# Patient Record
Sex: Female | Born: 1950
Health system: Southern US, Community
[De-identification: ages and names within clinical notes are randomized; demographics above are authoritative.]

## PROBLEM LIST (undated history)

## (undated) ENCOUNTER — Emergency Department: Discharge status: Absent without leave | Payer: Self-pay

## (undated) DIAGNOSIS — S99911A Unspecified injury of right ankle, initial encounter: Secondary | ICD-10-CM

## (undated) DIAGNOSIS — F411 Generalized anxiety disorder: Secondary | ICD-10-CM

## (undated) DIAGNOSIS — B373 Candidiasis of vulva and vagina: Secondary | ICD-10-CM

## (undated) DIAGNOSIS — M5430 Sciatica, unspecified side: Secondary | ICD-10-CM

## (undated) DIAGNOSIS — R05 Cough: Secondary | ICD-10-CM

## (undated) DIAGNOSIS — M25511 Pain in right shoulder: Secondary | ICD-10-CM

## (undated) DIAGNOSIS — H469 Unspecified optic neuritis: Secondary | ICD-10-CM

## (undated) DIAGNOSIS — I10 Essential (primary) hypertension: Secondary | ICD-10-CM

## (undated) DIAGNOSIS — M722 Plantar fascial fibromatosis: Secondary | ICD-10-CM

## (undated) DIAGNOSIS — I700 Atherosclerosis of aorta: Secondary | ICD-10-CM

## (undated) DIAGNOSIS — R112 Nausea with vomiting, unspecified: Secondary | ICD-10-CM

## (undated) DIAGNOSIS — E876 Hypokalemia: Secondary | ICD-10-CM

## (undated) DIAGNOSIS — I4729 Other ventricular tachycardia: Secondary | ICD-10-CM

## (undated) DIAGNOSIS — G360 Neuromyelitis optica [Devic]: Secondary | ICD-10-CM

## (undated) DIAGNOSIS — J309 Allergic rhinitis, unspecified: Secondary | ICD-10-CM

## (undated) DIAGNOSIS — Z8673 Personal history of transient ischemic attack (TIA), and cerebral infarction without residual deficits: Secondary | ICD-10-CM

## (undated) DIAGNOSIS — R002 Palpitations: Secondary | ICD-10-CM

## (undated) DIAGNOSIS — K219 Gastro-esophageal reflux disease without esophagitis: Secondary | ICD-10-CM

## (undated) DIAGNOSIS — M79604 Pain in right leg: Secondary | ICD-10-CM

## (undated) DIAGNOSIS — K429 Umbilical hernia without obstruction or gangrene: Secondary | ICD-10-CM

## (undated) DIAGNOSIS — M2550 Pain in unspecified joint: Secondary | ICD-10-CM

## (undated) DIAGNOSIS — R109 Unspecified abdominal pain: Secondary | ICD-10-CM

## (undated) DIAGNOSIS — E2839 Other primary ovarian failure: Secondary | ICD-10-CM

## (undated) DIAGNOSIS — M1990 Unspecified osteoarthritis, unspecified site: Secondary | ICD-10-CM

## (undated) DIAGNOSIS — E559 Vitamin D deficiency, unspecified: Secondary | ICD-10-CM

## (undated) DIAGNOSIS — E785 Hyperlipidemia, unspecified: Secondary | ICD-10-CM

## (undated) DIAGNOSIS — H5210 Myopia, unspecified eye: Secondary | ICD-10-CM

## (undated) DIAGNOSIS — J069 Acute upper respiratory infection, unspecified: Secondary | ICD-10-CM

## (undated) DIAGNOSIS — M8580 Other specified disorders of bone density and structure, unspecified site: Secondary | ICD-10-CM

## (undated) DIAGNOSIS — R21 Rash and other nonspecific skin eruption: Secondary | ICD-10-CM

## (undated) DIAGNOSIS — Z90710 Acquired absence of both cervix and uterus: Secondary | ICD-10-CM

## (undated) DIAGNOSIS — I472 Ventricular tachycardia: Secondary | ICD-10-CM

## (undated) DIAGNOSIS — M25521 Pain in right elbow: Secondary | ICD-10-CM

## (undated) DIAGNOSIS — H5340 Unspecified visual field defects: Secondary | ICD-10-CM

## (undated) DIAGNOSIS — R42 Dizziness and giddiness: Secondary | ICD-10-CM

## (undated) HISTORY — DX: Essential (primary) hypertension: I10

## (undated) HISTORY — DX: Generalized anxiety disorder: F41.1

## (undated) HISTORY — DX: Palpitations: R00.2

## (undated) HISTORY — DX: Hyperlipidemia, unspecified: E78.5

## (undated) HISTORY — DX: Myopia, unspecified eye: H52.10

## (undated) HISTORY — DX: Morbid (severe) obesity due to excess calories: E66.01

## (undated) HISTORY — DX: Ventricular tachycardia: I47.2

## (undated) HISTORY — DX: Unspecified osteoarthritis, unspecified site: M19.90

## (undated) HISTORY — DX: Other specified disorders of bone density and structure, unspecified site: M85.80

## (undated) HISTORY — DX: Unspecified visual field defects: H53.40

## (undated) HISTORY — DX: Other ventricular tachycardia: I47.29

## (undated) HISTORY — DX: Acquired absence of both cervix and uterus: Z90.710

## (undated) SURGERY — EGD (ESOPHAGOGASTRODUODENOSCOPY)
Anesthesia: Moderate Sedation

---
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
## 1972-01-10 HISTORY — PX: ABDOMINAL HYSTERECTOMY: SHX81

## 1999-05-09 ENCOUNTER — Encounter: Admission: RE | Admit: 1999-05-09 | Discharge: 1999-05-09 | Payer: Self-pay | Admitting: Internal Medicine

## 2001-03-05 ENCOUNTER — Emergency Department (HOSPITAL_COMMUNITY): Admission: EM | Admit: 2001-03-05 | Discharge: 2001-03-05 | Payer: Self-pay | Admitting: *Deleted

## 2003-05-13 ENCOUNTER — Encounter: Admission: RE | Admit: 2003-05-13 | Discharge: 2003-05-13 | Payer: Self-pay | Admitting: Internal Medicine

## 2003-10-18 ENCOUNTER — Emergency Department (HOSPITAL_COMMUNITY): Admission: EM | Admit: 2003-10-18 | Discharge: 2003-10-18 | Payer: Self-pay | Admitting: *Deleted

## 2004-05-05 ENCOUNTER — Ambulatory Visit: Payer: Self-pay | Admitting: Internal Medicine

## 2004-11-23 ENCOUNTER — Ambulatory Visit: Payer: Self-pay | Admitting: Internal Medicine

## 2004-12-08 ENCOUNTER — Ambulatory Visit: Payer: Self-pay | Admitting: Cardiology

## 2004-12-08 ENCOUNTER — Encounter: Admission: RE | Admit: 2004-12-08 | Discharge: 2004-12-08 | Payer: Self-pay | Admitting: Internal Medicine

## 2004-12-19 ENCOUNTER — Emergency Department (HOSPITAL_COMMUNITY): Admission: EM | Admit: 2004-12-19 | Discharge: 2004-12-20 | Payer: Self-pay | Admitting: Emergency Medicine

## 2004-12-23 ENCOUNTER — Ambulatory Visit: Payer: Self-pay | Admitting: Internal Medicine

## 2005-01-10 ENCOUNTER — Encounter: Payer: Self-pay | Admitting: Cardiology

## 2005-01-10 ENCOUNTER — Ambulatory Visit: Payer: Self-pay

## 2005-01-23 ENCOUNTER — Ambulatory Visit: Payer: Self-pay | Admitting: Internal Medicine

## 2005-01-23 ENCOUNTER — Ambulatory Visit: Payer: Self-pay

## 2005-08-20 ENCOUNTER — Emergency Department (HOSPITAL_COMMUNITY): Admission: EM | Admit: 2005-08-20 | Discharge: 2005-08-20 | Payer: Self-pay | Admitting: *Deleted

## 2006-01-25 ENCOUNTER — Ambulatory Visit: Payer: Self-pay | Admitting: Internal Medicine

## 2006-05-04 ENCOUNTER — Ambulatory Visit: Payer: Self-pay | Admitting: Pulmonary Disease

## 2006-10-01 ENCOUNTER — Ambulatory Visit: Payer: Self-pay

## 2006-10-01 ENCOUNTER — Ambulatory Visit: Payer: Self-pay | Admitting: Internal Medicine

## 2006-10-01 LAB — CONVERTED CEMR LAB
Creatinine, Ser: 0.9 mg/dL (ref 0.4–1.2)
Glucose, Bld: 101 mg/dL — ABNORMAL HIGH (ref 70–99)
Potassium: 4.5 meq/L (ref 3.5–5.1)
Sodium: 144 meq/L (ref 135–145)

## 2006-10-08 ENCOUNTER — Ambulatory Visit: Payer: Self-pay | Admitting: Internal Medicine

## 2006-10-25 ENCOUNTER — Ambulatory Visit: Payer: Self-pay | Admitting: Internal Medicine

## 2006-12-24 ENCOUNTER — Telehealth (INDEPENDENT_AMBULATORY_CARE_PROVIDER_SITE_OTHER): Payer: Self-pay | Admitting: *Deleted

## 2006-12-24 DIAGNOSIS — F411 Generalized anxiety disorder: Secondary | ICD-10-CM | POA: Insufficient documentation

## 2006-12-24 DIAGNOSIS — E785 Hyperlipidemia, unspecified: Secondary | ICD-10-CM | POA: Insufficient documentation

## 2006-12-24 DIAGNOSIS — Z8679 Personal history of other diseases of the circulatory system: Secondary | ICD-10-CM | POA: Insufficient documentation

## 2006-12-24 DIAGNOSIS — I10 Essential (primary) hypertension: Secondary | ICD-10-CM

## 2006-12-24 HISTORY — DX: Essential (primary) hypertension: I10

## 2007-01-14 ENCOUNTER — Ambulatory Visit: Payer: Self-pay | Admitting: Internal Medicine

## 2007-01-14 DIAGNOSIS — H5210 Myopia, unspecified eye: Secondary | ICD-10-CM

## 2007-01-15 ENCOUNTER — Encounter: Payer: Self-pay | Admitting: Internal Medicine

## 2007-01-15 ENCOUNTER — Ambulatory Visit (HOSPITAL_COMMUNITY): Admission: RE | Admit: 2007-01-15 | Discharge: 2007-01-15 | Payer: Self-pay | Admitting: Internal Medicine

## 2007-03-18 ENCOUNTER — Ambulatory Visit: Payer: Self-pay | Admitting: Internal Medicine

## 2007-08-20 ENCOUNTER — Telehealth (INDEPENDENT_AMBULATORY_CARE_PROVIDER_SITE_OTHER): Payer: Self-pay | Admitting: *Deleted

## 2007-09-20 ENCOUNTER — Ambulatory Visit: Payer: Self-pay | Admitting: Internal Medicine

## 2007-09-23 ENCOUNTER — Ambulatory Visit: Payer: Self-pay | Admitting: Internal Medicine

## 2007-09-24 LAB — CONVERTED CEMR LAB
ALT: 19 units/L (ref 0–35)
Albumin: 3.9 g/dL (ref 3.5–5.2)
BUN: 14 mg/dL (ref 6–23)
Basophils Relative: 1.4 % (ref 0.0–3.0)
CO2: 29 meq/L (ref 19–32)
Calcium: 8.9 mg/dL (ref 8.4–10.5)
Cholesterol: 229 mg/dL (ref 0–200)
Creatinine, Ser: 0.8 mg/dL (ref 0.4–1.2)
Eosinophils Relative: 0.9 % (ref 0.0–5.0)
GFR calc Af Amer: 95 mL/min
Glucose, Bld: 91 mg/dL (ref 70–99)
HCT: 39.2 % (ref 36.0–46.0)
Hemoglobin: 13.4 g/dL (ref 12.0–15.0)
Monocytes Absolute: 0.3 10*3/uL (ref 0.1–1.0)
Monocytes Relative: 10.1 % (ref 3.0–12.0)
Neutro Abs: 1.2 10*3/uL — ABNORMAL LOW (ref 1.4–7.7)
RBC: 4.9 M/uL (ref 3.87–5.11)
RDW: 13.6 % (ref 11.5–14.6)
Sodium: 148 meq/L — ABNORMAL HIGH (ref 135–145)
TSH: 0.79 microintl units/mL (ref 0.35–5.50)
Total CHOL/HDL Ratio: 7.6
Total Protein: 7.2 g/dL (ref 6.0–8.3)

## 2008-01-07 ENCOUNTER — Telehealth (INDEPENDENT_AMBULATORY_CARE_PROVIDER_SITE_OTHER): Payer: Self-pay | Admitting: *Deleted

## 2008-01-16 ENCOUNTER — Ambulatory Visit: Payer: Self-pay | Admitting: Internal Medicine

## 2008-04-09 ENCOUNTER — Telehealth: Payer: Self-pay | Admitting: Internal Medicine

## 2008-09-25 ENCOUNTER — Encounter (INDEPENDENT_AMBULATORY_CARE_PROVIDER_SITE_OTHER): Payer: Self-pay | Admitting: *Deleted

## 2008-11-03 ENCOUNTER — Encounter (INDEPENDENT_AMBULATORY_CARE_PROVIDER_SITE_OTHER): Payer: Self-pay | Admitting: *Deleted

## 2008-11-11 ENCOUNTER — Telehealth: Payer: Self-pay | Admitting: Internal Medicine

## 2008-11-13 ENCOUNTER — Telehealth: Payer: Self-pay | Admitting: Internal Medicine

## 2008-12-04 ENCOUNTER — Ambulatory Visit: Payer: Self-pay | Admitting: Internal Medicine

## 2008-12-04 DIAGNOSIS — I472 Ventricular tachycardia, unspecified: Secondary | ICD-10-CM | POA: Insufficient documentation

## 2008-12-04 DIAGNOSIS — M2550 Pain in unspecified joint: Secondary | ICD-10-CM

## 2008-12-04 HISTORY — DX: Pain in unspecified joint: M25.50

## 2008-12-04 HISTORY — DX: Ventricular tachycardia: I47.2

## 2008-12-04 HISTORY — DX: Ventricular tachycardia, unspecified: I47.20

## 2008-12-09 LAB — CONVERTED CEMR LAB
BUN: 8 mg/dL (ref 6–23)
CO2: 31 meq/L (ref 19–32)
Chloride: 106 meq/L (ref 96–112)
Cholesterol: 222 mg/dL — ABNORMAL HIGH (ref 0–200)
Creatinine, Ser: 0.8 mg/dL (ref 0.4–1.2)
Potassium: 4.1 meq/L (ref 3.5–5.1)

## 2009-06-18 ENCOUNTER — Telehealth: Payer: Self-pay | Admitting: Internal Medicine

## 2010-01-20 ENCOUNTER — Ambulatory Visit
Admission: RE | Admit: 2010-01-20 | Discharge: 2010-01-20 | Payer: Self-pay | Source: Home / Self Care | Attending: Internal Medicine | Admitting: Internal Medicine

## 2010-01-20 ENCOUNTER — Encounter: Payer: Self-pay | Admitting: Internal Medicine

## 2010-01-21 ENCOUNTER — Telehealth: Payer: Self-pay | Admitting: Internal Medicine

## 2010-01-26 ENCOUNTER — Telehealth: Payer: Self-pay | Admitting: Internal Medicine

## 2010-01-28 ENCOUNTER — Emergency Department (HOSPITAL_COMMUNITY)
Admission: EM | Admit: 2010-01-28 | Discharge: 2010-01-28 | Payer: Self-pay | Source: Home / Self Care | Admitting: Emergency Medicine

## 2010-01-31 ENCOUNTER — Telehealth: Payer: Self-pay | Admitting: Internal Medicine

## 2010-02-08 NOTE — Progress Notes (Signed)
Summary: refill**[facility] in [place]**Please call pt once sent   Phone Note Refill Request Call back at Home Phone [phone] Message from:  Patient on June 18, 2009 10:09 AM  Refills Requested: Medication #1:  NADOLOL 40 MG TABS Take 1 tablet by mouth twice a day   Supply Requested: 6 months [facility] in [place] [phone]   Method Requested: Telephone to Pharmacy Initial call taken by: Migdalia Dk,  June 18, 2009 10:10 AM  Follow-up for Phone Call        called in to pharmacy. Follow-up by: Laurance Flatten CMA,  June 18, 2009 2:06 PM

## 2010-02-10 NOTE — Progress Notes (Addendum)
Summary: Question about meds   Phone Note Call from Patient Call back at Home Phone [phone] Message from:  Patient on January 21, 2010 12:50 PM  Caller: Patient Reason for Call: Talk to Nurse Summary of Call: About a medication Initial call taken by: Judie Grieve,  January 21, 2010 12:52 PM  Follow-up for Phone Call        [email or web address] Michelle Gladden, RN, BSN 01/21/10 1301 called pt and she is asking for [name] to prescribe/refill Sulindac. Adv pt that we do not prescribe that medication and to contact Dr. Sherene Sires for a renewal. Took some explaining but pt expressed understanding.  Follow-up by: Michelle Gladden RN,  January 21, 2010 2:46 PM

## 2010-02-10 NOTE — Progress Notes (Signed)
Summary: question re meds   Phone Note Call from Patient Call back at Home Phone [phone]   Caller: Patient Reason for Call: Talk to Nurse Summary of Call:  pt states she went to the hospital on friday. pt states at the hospital they gave her a prescription for prednisone 50mg . pt wants to know if she can take the med. Initial call taken by: Roe Coombs,  January 31, 2010 10:57 AM  Follow-up for Phone Call        explained to her that the Prednisone may cause her heart to race and she should really follow up with the ortho doctor Dennis Bast, RN, BSN  January 31, 2010 11:46 AM

## 2010-02-10 NOTE — Progress Notes (Signed)
Summary: refill on Sulindac  Phone Note From Pharmacy   Caller: Michelle Hurley Kitchen Call For: Michelle Hurley  Summary of Call: Received a refill request for Sulindac 200mg  tablets. Pt last seen in 11-2008. She has a pending appt on 02-24-10. Please advsie if ok to give refill.  Initial call taken by: Carron Curie CMA,  January 26, 2010 10:59 AM  Follow-up for Phone Call        ok Follow-up by: Nyoka Cowden MD,  January 26, 2010 11:01 AM    New/Updated Medications: SULINDAC 200 MG TABS (SULINDAC) Take one tablet by mouth twice daily with meals for aches and pains Prescriptions: SULINDAC 200 MG TABS (SULINDAC) Take one tablet by mouth twice daily with meals for aches and pains  #60 x 0   Entered by:   Carron Curie CMA   Authorized by:   Nyoka Cowden MD   Signed by:   Carron Curie CMA on 01/26/2010   Method used:   Electronically to        [facility] (retail)       [address]       [place], [place]  [address]       Ph: [phone]       Fax: [phone]   RxID:   [id number]

## 2010-02-10 NOTE — Assessment & Plan Note (Signed)
Summary: yearly/sl  Medications Added CARVEDILOL 25 MG TABS (CARVEDILOL) one by mouth bid TYLENOL EXTRA STRENGTH 500 MG TABS (ACETAMINOPHEN) as needed      Allergies Added:   Primary Provider:  Sherene Sires   History of Present Illness: Michelle Hurley returns today for followup of her VT.  She has a h/o NSVT and has been fairly well controlled on combination therapy with Nadolol and Verapamil.  She also has chronic diastolic CHF which has been fairly well controlled on a low sodium diet and lasix. Since we saw her last over a year ago, she has not had any additional VT but her blood pressure continues to be a problem. She denies sodium indiscretion.  Current Medications (verified): 1)  Furosemide 40 Mg  Tabs (Furosemide) .... Every Morning 2)  Nadolol 40 Mg Tabs (Nadolol) .... Take 1 Tablet By Mouth Twice A Day 3)  Verapamil Hcl Cr 120 Mg  Cp24 (Verapamil Hcl) .... Take 1/2 Tablet Three Times A Day 4)  Claritin 10 Mg  Tabs (Loratadine) .... As Needed 5)  Tylenol Extra Strength 500 Mg Tabs (Acetaminophen) .... As Needed  Allergies (verified): 1)  ! Codeine  Past History:  Past Medical History: Last updated: 12/04/2008 MYOPIA (ICD-367.1) ANXIETY (ICD-300.00) PALPITATIONS, HX OF (ICD-V12.50) OBESITY, MORBID (ICD-278.01)    - Target wt  =   158 for BMI < 30  HYPERLIPIDEMIA (ICD-272.4)    - target LDL less than 130 based on positive family history and hypertension HYPERTENSION (ICD-401.9) OSTEOPENIA    -  DEXA scan 05/13/1998 by [place] spine -1.9 left hip -.33 left femur -.4 Health Maintenance..........................................................Marland KitchenWert   - Td 1/07   -  neg colonoscopy 9/01 [facility]   -  CPX  09/23/07    Past Surgical History: Last updated: 09/23/2007 Hysterectomy with "one ovary left"  Review of Systems  The patient denies chest pain, syncope, dyspnea on exertion, and peripheral edema.    Vital Signs:  Patient profile:   60 year old female Height:      62  inches Weight:      186 pounds BMI:     34.14 Pulse rate:   66 / minute BP sitting:   178 / 102  (left arm)  Vitals Entered By: Laurance Flatten CMA (January 20, 2010 9:46 AM)  Physical Exam  General:  Obese, well developed, well nourished, in no acute distress.  HEENT: normal Neck: supple. No JVD. Carotids 2+ bilaterally no bruits Cor: RRR no rubs, gallops or murmur Lungs: CTA Ab: soft, nontender. nondistended. No HSM. Good bowel sounds Ext: warm. no cyanosis, clubbing or edema Neuro: alert and oriented. Grossly nonfocal. affect pleasant    EKG  Procedure date:  01/20/2010  Findings:      Normal sinus rhythm with rate of: 66.   Impression & Recommendations:  Problem # 1:  VENTRICULAR TACHYCARDIA (ICD-427.1) Her symptoms are well controlled. Will switch beta blockers today for blood pressure reasons. Her updated medication list for this problem includes:    Carvedilol 25 Mg Tabs (Carvedilol) ..... One by mouth bid    Verapamil Hcl Cr 120 Mg Cp24 (Verapamil hcl) .Marland Kitchen... Take 1/2 tablet three times a day  Orders: EKG w/ Interpretation (93[address])  Problem # 2:  HYPERTENSION (ICD-401.9) Her blood pressure is not well controlled. I have asked her to continue her low sodium diet and I am going to switch her to coreg today. Her updated medication list for this problem includes:    Furosemide 40 Mg Tabs (  Furosemide) ..... Every morning    Carvedilol 25 Mg Tabs (Carvedilol) ..... One by mouth bid    Verapamil Hcl Cr 120 Mg Cp24 (Verapamil hcl) .Marland Kitchen... Take 1/2 tablet three times a day  Patient Instructions: 1)  Your physician wants you to follow-up in:  6 months with Dr Court Joy will receive a reminder letter in the mail two months in advance. If you don't receive a letter, please call our office to schedule the follow-up appointment. 2)  Your physician has recommended you make the following change in your medication: stop Nadalol and start Carvedilol 25mg  two times a  day Prescriptions: CARVEDILOL 25 MG TABS (CARVEDILOL) one by mouth bid  #60 x 11   Entered by:   Dennis Bast, RN, BSN   Authorized by:   Laren Boom, MD, [facility]   Signed by:   Dennis Bast, RN, BSN on 01/20/2010   Method used:   Electronically to        [facility] (retail)       [address]       [place], [place]  [address]       Ph: [phone]       Fax: [phone]   RxID:   [id number]

## 2010-02-14 ENCOUNTER — Telehealth: Payer: Self-pay | Admitting: Internal Medicine

## 2010-02-22 ENCOUNTER — Other Ambulatory Visit: Payer: Self-pay

## 2010-02-24 ENCOUNTER — Ambulatory Visit: Payer: Self-pay | Admitting: Internal Medicine

## 2010-02-24 NOTE — Progress Notes (Signed)
Summary: reaction to meds/**LM/nm/2nd call  Medications Added FUROSEMIDE 40 MG  TABS (FUROSEMIDE) one by mouth two times a day POTASSIUM CHLORIDE CRYS CR 20 MEQ CR-TABS (POTASSIUM CHLORIDE CRYS CR) one by mouth daily       Phone Note Call from Patient Call back at Home Phone [phone]   Caller: Patient Reason for Call: Talk to Nurse Summary of Call: pt having reaction to meds. pt having swelling a over her body to new meds.  Initial call taken by: Roe Coombs,  February 14, 2010 3:03 PM  Follow-up for Phone Call        [facility]. Ollen Gross, RN, BSN  February 14, 2010 4:50 PM   Additional Follow-up for Phone Call Additional follow up Details #1::        Pt was rude and upset that she had miss the nurse call and  we had ask her for the reason for her call. pt stated she only wants to talk to [facility] or dr Ladona Ridgel. Pt stated she still having issues with her blood pressure and some swelling. pt wants to know if she needs to be seen by the doctor again.  Additional Follow-up by: Roe Coombs,  February 15, 2010 2:53 PM    Additional Follow-up for Phone Call Additional follow up Details #2::    Left ankle and foot swollen rt foot and ankle too but when she goes to bed the rt one goes down the lft goes down partially but not all the way.  BP is still up.  Discussed with Dr Ladona Ridgel pt to take Furosemide 40mg  two times a day and K-dur daily  have a BMP next week Pt aware Dennis Bast, RN, BSN  February 15, 2010 4:12 PM Follow-up by: Dennis Bast, RN, BSN,  February 15, 2010 4:09 PM  New/Updated Medications: FUROSEMIDE 40 MG  TABS (FUROSEMIDE) one by mouth two times a day POTASSIUM CHLORIDE CRYS CR 20 MEQ CR-TABS (POTASSIUM CHLORIDE CRYS CR) one by mouth daily Prescriptions: POTASSIUM CHLORIDE CRYS CR 20 MEQ CR-TABS (POTASSIUM CHLORIDE CRYS CR) one by mouth daily  #30 x 6   Entered by:   Dennis Bast, RN, BSN   Authorized by:   Laren Boom, MD, [facility]   Signed by:    Dennis Bast, RN, BSN on 02/15/2010   Method used:   Electronically to        [facility] (retail)       [address]       [place], [place]  [address]       Ph: [phone]       Fax: [phone]   RxID:   [id number] FUROSEMIDE 40 MG  TABS (FUROSEMIDE) one by mouth two times a day  #60 x 6   Entered by:   Dennis Bast, RN, BSN   Authorized by:   Laren Boom, MD, [facility]   Signed by:   Dennis Bast, RN, BSN on 02/15/2010   Method used:   Electronically to        [facility] (retail)       [address]       [place], [place]  [address]       Ph: [phone]       Fax: [phone]   RxID:   [id number]

## 2010-02-25 ENCOUNTER — Telehealth: Payer: Self-pay | Admitting: Internal Medicine

## 2010-03-02 NOTE — Progress Notes (Signed)
Summary: nos appt  Phone Note Call from Patient   Caller: [email or web address]  Call For: Keilah Lemire Summary of Call: Rsc nos from 2/16 to 3/6. Initial call taken by: Darletta Moll,  February 25, 2010 4:01 PM

## 2010-03-15 ENCOUNTER — Ambulatory Visit (INDEPENDENT_AMBULATORY_CARE_PROVIDER_SITE_OTHER): Payer: Self-pay | Admitting: Internal Medicine

## 2010-03-15 ENCOUNTER — Telehealth (INDEPENDENT_AMBULATORY_CARE_PROVIDER_SITE_OTHER): Payer: Self-pay | Admitting: *Deleted

## 2010-03-15 ENCOUNTER — Encounter: Payer: Self-pay | Admitting: Internal Medicine

## 2010-03-15 ENCOUNTER — Other Ambulatory Visit: Payer: Self-pay | Admitting: Internal Medicine

## 2010-03-15 ENCOUNTER — Other Ambulatory Visit: Payer: Self-pay

## 2010-03-15 DIAGNOSIS — I10 Essential (primary) hypertension: Secondary | ICD-10-CM

## 2010-03-15 LAB — BASIC METABOLIC PANEL
CO2: 27 mEq/L (ref 19–32)
Calcium: 8.9 mg/dL (ref 8.4–10.5)
Chloride: 108 mEq/L (ref 96–112)
Creatinine, Ser: 0.9 mg/dL (ref 0.4–1.2)
Glucose, Bld: 97 mg/dL (ref 70–99)
Sodium: 140 mEq/L (ref 135–145)

## 2010-03-22 NOTE — Assessment & Plan Note (Signed)
Summary: Primary svc/ cpx    Primary Provider/Referring Provider:  Sherene Sires  CC:  Pt last seen 11/2008. Pt c/o high BP and feet and ankle swelling.  History of Present Illness: 1 yobf followed here for hypertension and nonsustained ventricular tachycardia for which she is maintained on a combination of nadolol and verapamil.  She denies any exertional dyspnea or palpitations fevers chills sweats orthopnea PND or leg swelling TIA or claudication symptoms.  She is having trouble paying for her medical care but has insurance for the next 6 months.  She has requested referral for ophthalmology because she is using store-bought glasses to see and also his delinquent in mammography which he has asked [name] to schedule for a cone system.  She has had no breast symptoms or findings on self-exam.  09/23/07 cpx.  LDL 172, rec wt loss  March 15, 2010 ov Pt last seen 11/2008. Pt c/o high BP, feet and ankle swelling x 3 months, no sob. Pt denies any significant sore throat, dysphagia, itching, sneezing,  nasal congestion or excess secretions,  fever, chills, sweats, unintended wt loss, pleuritic or exertional cp, hempoptysis, change in activity tolerance  orthopnea pnd. some better with lasix increase but not resolved.  Current Medications (verified): 1)  Furosemide 40 Mg  Tabs (Furosemide) .... One By Mouth Two Times A Day 2)  Carvedilol 25 Mg Tabs (Carvedilol) .... One By Mouth Bid 3)  Verapamil Hcl Cr 120 Mg  Cp24 (Verapamil Hcl) .... Take 1/2 Tablet Three Times A Day 4)  Claritin 10 Mg  Tabs (Loratadine) .... As Needed 5)  Tylenol Extra Strength 500 Mg Tabs (Acetaminophen) .... As Needed 6)  Sulindac 200 Mg Tabs (Sulindac) .... Take One Tablet By Mouth Twice Daily With Meals For Aches and Pains As Needed 7)  Potassium Chloride Crys Cr 20 Meq Cr-Tabs (Potassium Chloride Crys Cr) .... One By Mouth Daily  Allergies (verified): 1)  ! Codeine  Past History:  Past Medical History: MYOPIA  (ICD-367.1) ANXIETY (ICD-300.00) PALPITATIONS, HX OF (ICD-V12.50) OBESITY, MORBID (ICD-278.01)    - Target wt  =   158 for BMI < 30  HYPERLIPIDEMIA (ICD-272.4)    - target LDL less than 130 based on positive family history and hypertension HYPERTENSION (ICD-401.9) OSTEOPENIA    -  DEXA scan 05/13/1998 by [place] [facility] -1.9 left hip -.33 left femur -.4 Health Maintenance............................................................Marland KitchenWert   - Td 1/07   -  neg colonoscopy 9/01 [facility] [place]   -  CPX  09/23/07    Vital Signs:  Patient profile:   59 year old female Height:      62 inches Weight:      195.13 pounds BMI:     35.82 O2 Sat:      97 % on Room air Temp:     98.1 degrees F oral Pulse rate:   74 / minute BP sitting:   142 / 84  (left arm) Cuff size:   regular  Vitals Entered By: Carver Fila (March 15, 2010 9:16 AM)  O2 Flow:  Room air CC: Pt last seen 11/2008. Pt c/o high BP, feet and ankle swelling Comments meds and allergies updated Phone number updated Carver Fila  March 15, 2010 9:17 AM    Physical Exam  Additional Exam:  wt 182 September 23, 2007 > 183 January 16, 2008 > 186  December 04, 2008  > 195 March 15, 2010  in general this is a obese pleasant ambulatory black female  in no acute distress. Afeb with normal vital signs HEENT: nl dentition, turbinates, and orophanx. Nl external ear canals without cough reflex Neck without JVD/Nodes/TM Lungs clear to A and P bilaterally without cough on insp or exp maneuvers RRR no s3 or murmur or increase in P2.  Trace edema both ankles Abd soft and benign with nl excursion in the supine position. No bruits or organomegaly Ext warm without calf tenderness, cyanosis clubbing  Skin warm and dry without lesions     Impression & Recommendations:  Problem # 1:  HYPERTENSION (ICD-401.9)  Her updated medication list for this problem includes:    Furosemide 40 Mg Tabs (Furosemide) ..... One each am, if still swollen in the afternoon  take a second one with potassium    Carvedilol 25 Mg Tabs (Carvedilol) ..... One by mouth bid    Verapamil Hcl Cr 120 Mg Cp24 (Verapamil hcl) .Marland Kitchen... Take 1/2 tablet three times a day    Aldactone 50 Mg Tabs (Spironolactone) ..... One daily   Each maintenance medication was reviewed in detail including most importantly the difference between maintenance and as needed and under what circumstances the prns are to be used. See instructions for specific recommendations   Medications Added to Medication List This Visit: 1)  Furosemide 40 Mg Tabs (Furosemide) .... One each am, if still swollen in the afternoon take a second one with potassium 2)  Aldactone 50 Mg Tabs (Spironolactone) .... One daily 3)  Sulindac 200 Mg Tabs (Sulindac) .... Take one tablet by mouth twice daily with meals for aches and pains as needed 4)  Potassium Chloride Crys Cr 20 Meq Cr-tabs (Potassium chloride crys cr) .... Take only if need a second dose of furosemide  Other Orders: TLB-BMP (Basic Metabolic Panel-BMET) (80048-METABOL) Est. Patient Level III ([address])  Patient Instructions: 1)  Please schedule a follow-up appointment in 6 weeks, sooner if needed either Wert and Tammy 2)  Add aldactone (spironolactone) 50 mg each am 3)  Only take the pm dose of furosemide if still swollen but if you take it then take the potassium with it.  Prescriptions: ALDACTONE 50 MG TABS (SPIRONOLACTONE) one daily  #34 x 11   Entered and Authorized by:   Nyoka Cowden MD   Signed by:   Nyoka Cowden MD on 03/15/2010   Method used:   Electronically to        [facility] (retail)       [address]       [place], [place]  [address]       Ph: [phone]       Fax: [phone]   RxID:   [id number]

## 2010-03-22 NOTE — Progress Notes (Signed)
Summary: meds/ memory loss concerns > defer to Dr Ladona Ridgel re Coreg  Phone Note Call from Patient Call back at [facility] Phone [phone]   Caller: Patient Call For: [facility] Summary of Call: pt wa seen "minutes ago". forgot to mention to dr wert that since she has been taking CARVEDILOL 25mg  she has had some memory loss, such as "what the date is/ what day it is, etc. call pt at home # above.  Initial call taken by: Tivis Ringer, CNA,  March 15, 2010 10:05 AM  Follow-up for Phone Call        PT forgot to mention to dr Sherene Sires that since she started Carvedilol 25mg  two times a day per Dr Riley Lam in January, she has had worsening problems with memory loss and wants to know if Dr Sherene Sires thinks this is related.  Please Advise. Abigail Miyamoto RN  March 15, 2010 12:01 PM  dont' think so but prefer she take this up with Dr Lubertha Basque office Follow-up by: Nyoka Cowden MD,  March 15, 2010 12:42 PM  Additional Follow-up for Phone Call Additional follow up Details #1::        [facility] for pt with Dr Thurston Hole response to question about meds. Abigail Miyamoto RN  March 15, 2010 1:01 PM

## 2010-04-08 ENCOUNTER — Other Ambulatory Visit: Payer: Self-pay | Admitting: Internal Medicine

## 2010-04-26 ENCOUNTER — Telehealth: Payer: Self-pay | Admitting: Internal Medicine

## 2010-04-26 ENCOUNTER — Encounter: Payer: Self-pay | Admitting: Internal Medicine

## 2010-04-26 NOTE — Telephone Encounter (Signed)
Pt advised at her last OV MW told her "you don't have any insurance" and "when you get insurance we will do your physical". Pt states she does not have insurance and when she tried to apply for Medicare/Medicaid she was denied due to age. I assured pt that MW was only externally voicing his inner thoughts in order to bill properly and to keep from charging her for a CPX when he could just order what was needed to provide the care she needed at the cost she can afford. I encouraged pt to keep AM appointment and to come fasting.

## 2010-04-27 ENCOUNTER — Other Ambulatory Visit (INDEPENDENT_AMBULATORY_CARE_PROVIDER_SITE_OTHER): Payer: Self-pay | Admitting: Internal Medicine

## 2010-04-27 ENCOUNTER — Encounter: Payer: Self-pay | Admitting: Internal Medicine

## 2010-04-27 ENCOUNTER — Ambulatory Visit (INDEPENDENT_AMBULATORY_CARE_PROVIDER_SITE_OTHER): Payer: Self-pay | Admitting: Internal Medicine

## 2010-04-27 ENCOUNTER — Other Ambulatory Visit (INDEPENDENT_AMBULATORY_CARE_PROVIDER_SITE_OTHER): Payer: Self-pay

## 2010-04-27 DIAGNOSIS — I10 Essential (primary) hypertension: Secondary | ICD-10-CM

## 2010-04-27 DIAGNOSIS — E785 Hyperlipidemia, unspecified: Secondary | ICD-10-CM

## 2010-04-27 DIAGNOSIS — I472 Ventricular tachycardia, unspecified: Secondary | ICD-10-CM

## 2010-04-27 LAB — BASIC METABOLIC PANEL
BUN: 16 mg/dL (ref 6–23)
CO2: 29 mEq/L (ref 19–32)
Chloride: 103 mEq/L (ref 96–112)
Glucose, Bld: 93 mg/dL (ref 70–99)
Potassium: 3.9 mEq/L (ref 3.5–5.1)

## 2010-04-27 LAB — LIPID PANEL
Cholesterol: 262 mg/dL — ABNORMAL HIGH (ref 0–200)
HDL: 49.5 mg/dL (ref >39.00)
Triglycerides: 124 mg/dL (ref 0.0–149.0)
VLDL: 24.8 mg/dL (ref 0.0–40.0)

## 2010-04-27 LAB — LDL CHOLESTEROL, DIRECT: Direct LDL: 182.8 mg/dL

## 2010-04-27 NOTE — Progress Notes (Signed)
  Subjective:    [name], female    DOB: [birth year], 60 y.o.   MRN: [id number]  HPI  89 yobf followed here for hypertension and nonsustained ventricular tachycardia for which she is maintained on coreg/verapamil  09/23/07 cpx. LDL 172, rec wt loss   March 15, 2010 ov Pt last seen 11/2008. Pt c/o high BP, feet and ankle swelling x 3 months, no sob.  rec add aldactone 50 qd  04/27/2010 ov/Michelle Hurley  Cc swelling much better no sob  Pt denies any significant sore throat, dysphagia, itching, sneezing,  nasal congestion or excess/ purulent secretions,  fever, chills, sweats, unintended wt loss, pleuritic or exertional cp, hempoptysis, orthopnea pnd or leg swelling.    Also denies any obvious fluctuation of symptoms with weather or environmental changes or other aggravating or alleviating factors.       Allergies  1) ! Codeine   Past Medical History:  MYOPIA (ICD-367.1)  ANXIETY (ICD-300.00)  PALPITATIONS, HX OF (ICD-V12.50)  OBESITY, MORBID (ICD-278.01)  - Target wt = 158 for BMI < 30  HYPERLIPIDEMIA (ICD-272.4)  - target LDL less than 130 based on positive family history and hypertension  HYPERTENSION (ICD-401.9)  OSTEOPENIA  - DEXA scan 05/13/1998 by [place] spine -1.9 left hip -.33 left femur -.4  Health Maintenance............................................................Marland KitchenWert  - Td 01/2005  - neg colonoscopy 9/01 Michelle Hurley  - CPX 09/23/07 > declined repeat due to expense    2) Add aldactone (spironolactone) 50 mg each am   Review of Systems     Objective:   Physical Exam wt 182 September 23, 2007 > 183 January 16, 2008 >  > 195 March 15, 2010 > 189 04/27/2010  in general this is a obese pleasant ambulatory black female in no acute distress.  Afeb with normal vital signs  HEENT: nl dentition, turbinates, and orophanx. Nl external ear canals without cough reflex  Neck without JVD/Nodes/TM  Lungs clear to A and P bilaterally without cough on insp or exp  maneuvers  RRR no s3 or murmur or increase in P2. Trace edema both ankles  Abd soft and benign with nl excursion in the supine position. No bruits or organomegaly  Ext warm without calf tenderness, cyanosis clubbing  Skin warm and dry without lesions        Assessment & Plan:

## 2010-04-27 NOTE — Patient Instructions (Signed)
Weight control is simply a matter of calorie balance which needs to be tilted in your favor by eating less and exercising more.  To get the most out of exercise, you need to be continuously aware that you are short of breath, but never out of breath, for 30 minutes daily. As you improve, it will actually be easier for you to do the same amount of exercise  in  30 minutes so always push to the level where you are short of breath.  If this does not result in gradual weight reduction then I strongly recommend you see a nutritionist with a food diary x 2 weeks so that we can work out a negative calorie balance which is universally effective in steady weight loss programs.  Think of your calorie balance like you do your bank account where in this case you want the balance to go down so you must take in less calories than you burn up.  It's just that simple:  Hard to do, but easy to understand.  Good luck!   Please schedule a follow up visit in 3 months but call sooner if needed  

## 2010-04-27 NOTE — Assessment & Plan Note (Signed)
Did not take meds today reviewed

## 2010-04-27 NOTE — Assessment & Plan Note (Signed)
Reviewed, rec wt loss  See instructions for specific recommendations which were reviewed directly with the patient who was given a copy with highlighter outlining the key components.

## 2010-05-02 NOTE — Progress Notes (Signed)
Quick Note:  Spoke with pt and notified of results per Dr. Wert. Pt verbalized understanding and denied any questions.  ______ 

## 2010-05-24 NOTE — Assessment & Plan Note (Signed)
[facility]                         ELECTROPHYSIOLOGY OFFICE NOTE   Michelle Hurley, Michelle Hurley                  MRN:          [id number]  DATE:10/08/2006                            DOB:          22-Feb-1950    Michelle Hurley is referred today by Dr. Sandrea Hurley for evaluation of  palpitations, near syncope and documented nonsustained VT.   HISTORY OF PRESENT ILLNESS:  The patient is a very pleasant, 60 year old  woman with longstanding hypertension who has over the last several of  months had episodes of palpitations associated with dizziness and  lightheadedness.  She has never had any frank syncope.  She presents  today after having worn a cardiac monitor which demonstrated a fairly  substantial burst (up to 20 beats at a time) of nonsustained ventricular  tachycardia.  She is referred today for additional evaluation.  The  patient denies chest pain.  She does have very severe hypertension and  has had difficulty controlling this.  She does also have obesity.   PAST MEDICAL HISTORY:  1. History of longstanding hypertension.  2. Status post hysterectomy many years ago.  3. History of arthritis both in her feet, toes and other joints.  4. History of seasonal rhinitis and allergies.  5. History of hiatal hernia.   FAMILY HISTORY:  Notable for her mother being alive at age 72.  Her  father died of MI in his 21s.  She has four sisters still alive, one  brother who is alive.   MEDICATIONS:  1. Nadolol 40 mg twice daily.  2. Lasix 40 mg daily.   SOCIAL HISTORY:  The patient denies tobacco or ethanol abuse.  She  rarely drinks any alcohol whatsoever.  She drinks about 4 ounces of  caffeine a day.   REVIEW OF SYSTEMS:  She has chest discomfort briefly when she feels her  heart racing.  She has mild peripheral edema at times, particularly if  she does not take her Lasix.  She has occasional headache and has had  high blood pressure for over 20  years.   PHYSICAL EXAMINATION:  GENERAL:  She is a pleasant, middle-age woman in  no acute distress.  VITAL SIGNS:  Blood pressure today 169/110, pulse 75 and regular,  respirations 16.  Weight 185 pounds.  HEENT:  Normocephalic, atraumatic.  Pupils equal and round.  Oropharynx  moist.  Sclerae anicteric.  NECK:  No jugular venous distention.  No thyromegaly.  Trachea midline.  Carotids 2+ and symmetric.  LUNGS:  Clear bilaterally to auscultation, no wheezes, rales or rhonchi  were present.  ABDOMEN:  Soft, nontender, nondistended.  There was no organomegaly.  Bowel sounds are present.  No rebound or guarding.  EXTREMITIES:  No clubbing, cyanosis or edema.  Pulses 2+ and symmetric.  NEUROLOGIC:  Alert and oriented x3.  Cranial nerves 2-12 grossly intact.  Strength was 5/5 and symmetric.   EKG demonstrates sinus rhythm with occasional PVCs, the morphology of  which is positive in the inferior leads and transitioning from negative  to positive at lead V3 consistent with RV outflow tract VT.  Her aVR was  negative,  aVL negative, although not as much so.  Lead I was positive.   IMPRESSION:  1. Nonsustained ventricular tachycardia (symptomatic).  2. Hypertension, very severe.   DISCUSSION:  I have discussed treatment options with [name] in  detail.  As she has been on nadolol for some time, I have asked her to  continue this, although my expectation is that when she finishes her  nadolol, we would switch her to carvedilol which should help [name] with her  blood pressures better and I have given her a prescription for 25 mg  twice daily to replace nadolol.  I have also given her a prescription  today for Verapamil.  This VT is coming from the RV outflow tract and  hopefully will be suppressed with a combination of calcium blockers and  beta-blockers.  Ultimately, if it does not suppress, than we may have to  consider adding a sodium channel blocker or other antiarrhythmic drug in   the future.  I will plan to see her back in about 2 months.     Michelle Canning. Ladona Ridgel, MD  Electronically Signed    GWT/MedQ  DD: 10/08/2006  DT: 10/09/2006  Job #: [id number]   cc:   Michelle Dalton. Sherene Sires, MD, FCCP

## 2010-05-24 NOTE — Assessment & Plan Note (Signed)
[facility]                         ELECTROPHYSIOLOGY OFFICE NOTE   KISTA, ROBB                  MRN:          [id number]  DATE:03/18/2007                            DOB:          May 18, 1950    Michelle Hurley returns today for follow up.  She is a very pleasant middle-  aged woman with a history of nonsustained ventricular tachycardia from  the RV outflow tract who has been very nicely controlled on medical  therapy with verapamil and nadolol.  She returns today for follow up.  Her main complaint today is that of inability to lose weight.  The  patient has had very little in the way of arrhythmias since we last saw  her back in the fall.  She denies chest pain.  She has no syncope.  She  denies shortness of breath.   MEDICATIONS:  1. Lasix 40 a day.  2. Nadolol 40 mg twice daily.  3. Verapamil 120 mg half tablet three times a day.  4. Peri-Colace p.r.n. constipation.   PHYSICAL EXAMINATION:  GENERAL:  She is a pleasant, well-appearing woman  in no distress.  VITAL SIGNS:  Blood pressure was 162/100, pulse 68 and regular,  respirations were 18.  Weight was 187 pounds.  NECK:  No jugular distention.  LUNGS:  Clear bilaterally to auscultation.  No wheezes, rales or rhonchi  were present.  CARDIOVASCULAR:  Regular rate and rhythm with normal S1 and S2.  There  is a soft S4 gallop present.  ABDOMEN:  Soft and nontender.  The extremities demonstrated no edema.   IMPRESSION:  1. Nonsustained ventricular tachycardia from the RV outflow tract.  2. Hypertension.   DISCUSSION:  Overall, Michelle Hurley is stable.  She is maintaining  sinus rhythm very nicely.  She has had some constipation on verapamil  which has been controlled with the Peri-Colace.  I will plan to see the  patient back in the office in 6 months.     Doylene Canning. Ladona Ridgel, MD  Electronically Signed    GWT/MedQ  DD: 03/18/2007  DT: 03/19/2007  Job #: [id number]

## 2010-05-24 NOTE — Assessment & Plan Note (Signed)
[facility]                         ELECTROPHYSIOLOGY OFFICE NOTE   RIANNAH, STAGNER                  MRN:          [id number]  DATE:10/25/2006                            DOB:          12-Mar-1950    Ms. Record returned today for followup.  She is a very pleasant middle-  aged woman with longstanding severe hypertension, who has had  palpitations and dizziness and found to have nonsustained ventricular  tachycardia.  This appeared to be RV outflow tract VT and in conjunction  with her beta-blocker we started her on verapamil, which seemed  appropriate particularly in that she had severe problems with high blood  pressure and she returns today for followup.  She said that within about  2 or 3 days of starting the verapamil her symptoms improved as has her  blood pressure.  When I saw her at that time she had just filled her  nadolol prescription and, because I think her blood pressure will do  better with carvedilol, I asked that when she run out of her nadolol  that she switch over to carvedilol.  The patient has otherwise been  stable and had no specific complaints.  He denies constipation,  peripheral edema or other problems with her medications.   PHYSICAL EXAM:  She is a pleasant, well-appearing, middle-aged woman in  no acute distress.  The blood pressure today was 150/90, the pulse was  60 and regular, the respirations were 16, the weight was 184 pounds.  NECK:  No jugular venous distention.  LUNGS:  Clear bilaterally to auscultation.  No wheezes, rales or rhonchi  were present.  CARDIOVASCULAR EXAM:  A regular rate and rhythm with a normal S1 and S2.  EXTREMITIES:  Demonstrated no cyanosis, clubbing or edema.  The pulses  were 2+ and symmetric.   No EKGs were done today.   IMPRESSION:  1. Nonsustained ventricular tachycardia (right ventricle outflow tract      ventricular tachycardia).  2. Hypertension (very severe).   DISCUSSION:  Overall, Ms. Empson is much improved with a combination of  beta-blockers and calcium channel blockers.  I have asked that she  continue both.  I will see her back in 4 or 5 months.  She is to  maintain a low sodium diet.  We also talked about the importance of  getting adequate sleep, staying away from caffeine, staying away from  alcoholic beverages, and will plan to see her back at that time.     Doylene Canning. Ladona Ridgel, MD  Electronically Signed    GWT/MedQ  DD: 10/25/2006  DT: 10/26/2006  Job #: [id number]   cc:   Charlaine Dalton. Sherene Sires, MD, FCCP

## 2010-05-24 NOTE — Assessment & Plan Note (Signed)
[facility]                         ELECTROPHYSIOLOGY OFFICE NOTE   RACHAL, DVORSKY                  MRN:          [id number]  DATE:09/20/2007                            DOB:          12/28/1950    Ms. Standley returns today for followup.  She is a very pleasant middle-  aged woman with a history of hypertension and nonsustained ventricular  tachycardia.  On verapamil in conjunction with her beta-blocker, she has  had very nice control of her ventricular arrhythmias.  She denies  syncope.  She does admit to still some persistent elevation of her blood  pressure.  The patient returns today for followup.   Her medicines include Lasix 40 a day, nadolol 40 mg twice daily, and  verapamil 120 mg half tablet 3 times a day.   PHYSICAL EXAMINATION:  GENERAL:  She is a pleasant well-appearing woman  in no acute distress.  VITAL SIGNS:  Blood pressure today was 155/95, the pulse was 68 and  regular, respirations were 18, and the weight was 280 pounds.  NECK:  No jugular venous distention.  LUNGS:  Clear bilaterally to auscultation.  No wheezes, rales, or  rhonchi are present.  CARDIOVASCULAR:  Regular rate and rhythm with normal S1 and S2.  There  is a soft S4 gallop present.  ABDOMEN:  Soft and nontender.  EXTREMITIES:  No edema.   EKG demonstrates sinus rhythm with sinus arrhythmia and occasional PVCs.   IMPRESSION:  1. Symptomatic nonsustained ventricular tachycardia.  2. Severe hypertension  3. Dyslipidemia.   DISCUSSION:  Overall, the patient is stable and her ventricular  tachycardia is nicely controlled.  Her blood pressure remains slightly  elevated.  She is scheduled for followup with Dr. Sherene Sires.  I have  recommended that she continue on her verapamil for now to help prevent  her ventricular tachycardia.  We will plan to see the patient back in  the office in 1 year.      Doylene Canning. Ladona Ridgel, MD  Electronically Signed    GWT/MedQ   DD: 09/20/2007  DT: 09/21/2007  Job #: [id number]

## 2010-07-03 ENCOUNTER — Other Ambulatory Visit: Payer: Self-pay | Admitting: Internal Medicine

## 2010-09-08 ENCOUNTER — Telehealth: Payer: Self-pay | Admitting: Internal Medicine

## 2010-09-08 NOTE — Telephone Encounter (Signed)
LMOMTCB  Pt was last seen 04/27/10 and told to f/u in 3 months

## 2010-09-09 NOTE — Telephone Encounter (Signed)
lmomtcb  

## 2010-09-13 ENCOUNTER — Other Ambulatory Visit: Payer: Self-pay | Admitting: Internal Medicine

## 2010-09-13 MED ORDER — SULINDAC 200 MG PO TABS: 200.0000 mg | ORAL_TABLET | Freq: Two times a day (BID) | ORAL | Status: DC | PRN

## 2010-09-13 MED ORDER — SCOPOLAMINE 1 MG/3DAYS TD PT72: 1.0000 | MEDICATED_PATCH | TRANSDERMAL | Status: DC

## 2010-09-13 NOTE — Telephone Encounter (Signed)
Pt would like to speak w/ MW's nurse about seasick medication.  Pt can be reached at [phone].  Pt stated her pharmacy is [facility] on [address].  Pt is also requesting to get a refill on sunlindac for her cruise.   Antionette Fairy

## 2010-09-13 NOTE — Telephone Encounter (Signed)
Spoke with pt, states going on a cruise and needs rx for patches for sea sickness. Please advise ,thanks!

## 2010-09-13 NOTE — Telephone Encounter (Signed)
Pt returning call can be reached at [phone].Michelle Hurley

## 2010-09-13 NOTE — Telephone Encounter (Signed)
Spoke with pt and she states will only need 2 patches. Rx was sent to pharm.

## 2010-09-13 NOTE — Telephone Encounter (Signed)
Transderm Scop change q 3 days, give enough to last the trip

## 2010-09-13 NOTE — Telephone Encounter (Signed)
I have already refilled her sulindac. Need to know how many days she will be on cruise to know what quantity of patches to send. ATC her and NA, no option to leave VM, WCB.

## 2010-10-19 ENCOUNTER — Other Ambulatory Visit: Payer: Self-pay | Admitting: Internal Medicine

## 2010-11-02 ENCOUNTER — Other Ambulatory Visit: Payer: Self-pay | Admitting: Internal Medicine

## 2010-11-03 ENCOUNTER — Other Ambulatory Visit: Payer: Self-pay | Admitting: Internal Medicine

## 2010-11-07 ENCOUNTER — Other Ambulatory Visit: Payer: Self-pay | Admitting: Internal Medicine

## 2010-12-20 ENCOUNTER — Emergency Department (HOSPITAL_COMMUNITY)
Admission: EM | Admit: 2010-12-20 | Discharge: 2010-12-20 | Disposition: A | Discharge status: Home / Self Care | Payer: Self-pay | Attending: Emergency Medicine | Admitting: Emergency Medicine

## 2010-12-20 ENCOUNTER — Encounter (HOSPITAL_COMMUNITY): Payer: Self-pay | Admitting: *Deleted

## 2010-12-20 DIAGNOSIS — M25559 Pain in unspecified hip: Secondary | ICD-10-CM | POA: Insufficient documentation

## 2010-12-20 DIAGNOSIS — I10 Essential (primary) hypertension: Secondary | ICD-10-CM | POA: Insufficient documentation

## 2010-12-20 DIAGNOSIS — E785 Hyperlipidemia, unspecified: Secondary | ICD-10-CM | POA: Insufficient documentation

## 2010-12-20 DIAGNOSIS — M79609 Pain in unspecified limb: Secondary | ICD-10-CM | POA: Insufficient documentation

## 2010-12-20 DIAGNOSIS — M5430 Sciatica, unspecified side: Secondary | ICD-10-CM

## 2010-12-20 DIAGNOSIS — F411 Generalized anxiety disorder: Secondary | ICD-10-CM | POA: Insufficient documentation

## 2010-12-20 HISTORY — DX: Umbilical hernia without obstruction or gangrene: K42.9

## 2010-12-20 MED ORDER — TRAMADOL HCL 50 MG PO TABS: 50.0000 mg | ORAL_TABLET | Freq: Four times a day (QID) | ORAL | Status: AC | PRN

## 2010-12-20 MED ORDER — TRAMADOL HCL 50 MG PO TABS: 50.0000 mg | ORAL_TABLET | Freq: Four times a day (QID) | ORAL | Status: DC | PRN

## 2010-12-20 MED ORDER — HYDROCODONE-ACETAMINOPHEN 5-500 MG PO TABS
1.0000 | ORAL_TABLET | Freq: Four times a day (QID) | ORAL | Status: DC | PRN
Start: 2010-12-20 — End: 2010-12-20

## 2010-12-20 NOTE — ED Provider Notes (Signed)
History     CSN: [id number] Arrival date & time: 12/20/2010 11:52 AM   First MD Initiated Contact with Patient 12/20/10 1516      No chief complaint on file.   (Consider location/radiation/quality/duration/timing/severity/associated sxs/prior treatment) HPI Comments: States pain in the left leg and thigh into buttock started one week ago while at work.  She did not fall or injure herself.  No relief with otc meds.  Patient is a 60 y.o. female presenting with hip pain. The history is provided by the patient.  Hip Pain This is a new problem. The current episode started more than 1 week ago. The problem occurs constantly. The problem has not changed since onset.The symptoms are aggravated by bending and walking. The symptoms are relieved by nothing. She has tried nothing for the symptoms.    Past Medical History  Diagnosis Date  . Myopia   . Anxiety state, unspecified   . Personal history of unspecified circulatory disease   . Morbid obesity   . Other and unspecified hyperlipidemia   . Unspecified essential hypertension   . Osteopenia   . Umbilical hernia     Past Surgical History  Procedure Date  . Abdominal hysterectomy     Family History  Problem Relation Age of Onset  . Heart disease Mother   . Thyroid cancer Sister     History  Substance Use Topics  . Smoking status: Never Smoker   . Smokeless tobacco: Never Used  . Alcohol Use: No    OB History    Grav Para Term Preterm Abortions TAB SAB Ect Mult Living                  Review of Systems  Gastrointestinal: Negative.   Genitourinary: Negative.   Musculoskeletal: Negative for back pain.  All other systems reviewed and are negative.    Allergies  Codeine  Home Medications   Current Outpatient Rx  Name Route Sig Dispense Refill  . ACETAMINOPHEN 500 MG PO TABS Oral Take 500 mg by mouth 2 (two) times daily as needed.      Marland Kitchen CARVEDILOL 25 MG PO TABS Oral Take 25 mg by mouth 2 (two) times daily with  a meal.      . FUROSEMIDE 40 MG PO TABS  Take one in am and if still swollen in the afternoon take a second with potassium     . LORATADINE 10 MG PO TABS Oral Take 10 mg by mouth daily as needed.      Marland Kitchen POTASSIUM CHLORIDE CRYS CR 20 MEQ PO TBCR Oral Take 20 mEq by mouth daily as needed.      . SCOPOLAMINE BASE 1.5 MG TD PT72 Transdermal Place 1 patch (1.5 mg total) onto the skin every 3 (three) days. 2 patch 0  . SPIRONOLACTONE 50 MG PO TABS Oral Take 50 mg by mouth daily.      . SULINDAC 200 MG PO TABS Oral Take 1 tablet (200 mg total) by mouth 2 (two) times daily as needed. 60 tablet 5  . SULINDAC 200 MG PO TABS  TAKE ONE TABLET BY MOUTH TWICE DAILY WITH MEALS FOR ACHES AND PAIN 60 tablet 0    Pt needs to sched appt for refills  . VERAPAMIL HCL 120 MG PO TABS  TAKE ONE-HALF TABLET BY MOUTH THREE TIMES DAILY 45 tablet 12  . VERAPAMIL HCL 240 MG (CO) PO TB24  Take 1/2 three times a day       BP  139/93  Pulse 79  Temp(Src) 98.3 F (36.8 C) (Oral)  Resp 16  Ht 5\' 2"  (1.575 m)  Wt 183 lb (83.008 kg)  BMI 33.47 kg/m2  SpO2 100%  Physical Exam  Nursing note and vitals reviewed. Constitutional: She is oriented to person, place, and time. She appears well-developed and well-nourished. No distress.  HENT:  Head: Normocephalic and atraumatic.  Neck: Normal range of motion. Neck supple.  Musculoskeletal: Normal range of motion.  Neurological: She is alert and oriented to person, place, and time.       The lle appears grossly normal.  There is no swelling or edema.  There is ttp in the left buttock.  The DTR's are 2+ and equal in the ble.  Strength is 5/5 in the ble.  Ambulates without difficulty.  Skin: Skin is warm and dry. She is not diaphoretic.    ED Course  Procedures (including critical care time)  Labs Reviewed - No data to display No results found.   No diagnosis found.    MDM  Appears to be sciatica.  Cannot tolerate steroids.  Will prescribe ibu tid and lortab.  To  follow up in one week if no improvement.        Geoffery Lyons, MD 12/20/10 1534

## 2010-12-20 NOTE — ED Notes (Signed)
Pt st's her L leg hurts from her hip down to her foot, st's the pain randomly started about 1 week ago.  St's the lateral aspect of her L calf is numb from the knee to her foot.  Pt has full ROM in that leg, ambulates without difficulty, st's "I just move slower."  St's she took aleve (3 220mg ) and tylenol (2-3 500mg ) at home, st's it did not help at all.

## 2011-01-02 ENCOUNTER — Telehealth: Payer: Self-pay | Admitting: Internal Medicine

## 2011-01-02 NOTE — Telephone Encounter (Signed)
New msg Pt wants to talk to you about sciatica. She said she went er and she is having pain down her left side. Please call her back

## 2011-01-02 NOTE — Telephone Encounter (Signed)
Patient called, stating she wanted something for pain she is having in her left buttock radiating down her left leg.Stated she went to ER 3 weeks ago and was given medication with no relief.Advised to call PCP or go back to ER.Patient got upset stated Dr. Sherene Sires will not help her and she did not want to go back to ER and wait when they did not help her either.States Michelle Hurley always helps her.States it is time to see Michelle Hurley and wants to schedule appointment.Appointment scheduled with Michelle Hurley 01/24/11 at 3:45pm.

## 2011-01-23 ENCOUNTER — Encounter: Payer: Self-pay | Admitting: Internal Medicine

## 2011-01-24 ENCOUNTER — Encounter: Payer: Self-pay | Admitting: Internal Medicine

## 2011-01-24 ENCOUNTER — Ambulatory Visit (INDEPENDENT_AMBULATORY_CARE_PROVIDER_SITE_OTHER): Payer: Self-pay | Admitting: Internal Medicine

## 2011-01-24 VITALS — BP 146/98 | HR 73 | Ht 62.0 in | Wt 178.1 lb

## 2011-01-24 DIAGNOSIS — I472 Ventricular tachycardia: Secondary | ICD-10-CM

## 2011-01-24 MED ORDER — SPIRONOLACTONE 25 MG PO TABS: 25.0000 mg | ORAL_TABLET | Freq: Two times a day (BID) | ORAL | Status: DC

## 2011-01-24 MED ORDER — VERAPAMIL HCL 120 MG PO TABS: ORAL_TABLET | ORAL | Status: DC

## 2011-01-24 MED ORDER — FUROSEMIDE 40 MG PO TABS: ORAL_TABLET | ORAL | Status: DC

## 2011-01-24 MED ORDER — CARVEDILOL 25 MG PO TABS: 25.0000 mg | ORAL_TABLET | Freq: Two times a day (BID) | ORAL | Status: DC

## 2011-01-24 NOTE — Assessment & Plan Note (Signed)
Her symptoms are well controlled. She will continue the combination of beta blockers and calcium channel blockers.

## 2011-01-24 NOTE — Patient Instructions (Signed)
Your physician wants you to follow-up in: 6 months with Dr Taylor You will receive a reminder letter in the mail two months in advance. If you don't receive a letter, please call our office to schedule the follow-up appointment.  

## 2011-01-24 NOTE — Assessment & Plan Note (Signed)
Her blood pressure is reasonably well controlled. I have asked her to continue her current meds and maintain a low sodium diet.

## 2011-01-24 NOTE — Progress Notes (Signed)
HPI Michelle Hurley returns today for followup. She is a pleasant 61 yo woman with a h/o HTN, NSVT and arthritis. She has been diagnosed with sciatica. She has chronic left leg pain from this. No syncope, palpitations and her blood pressure has been better controlled. Allergies  Allergen Reactions  . Codeine Nausea And Vomiting  . Prednisone Nausea And Vomiting     Current Outpatient Prescriptions  Medication Sig Dispense Refill  . carvedilol (COREG) 25 MG tablet Take 25 mg by mouth 2 (two) times daily with a meal.        . ibuprofen (ADVIL,MOTRIN) 200 MG tablet Take 200 mg by mouth every 6 (six) hours as needed.        Marland Kitchen spironolactone (ALDACTONE) 25 MG tablet Take 25 mg by mouth 2 (two) times daily.      . verapamil (COVERA HS) 240 MG (CO) 24 hr tablet Take 1/2 three times a day         Past Medical History  Diagnosis Date  . Myopia   . Anxiety state, unspecified   . Personal history of unspecified circulatory disease   . Morbid obesity   . Other and unspecified hyperlipidemia   . Unspecified essential hypertension   . Osteopenia   . Umbilical hernia   . Palpitations   . Arthritis   . H/O: hysterectomy     ROS:   All systems reviewed and negative except as noted in the HPI.   Past Surgical History  Procedure Date  . Abdominal hysterectomy      Family History  Problem Relation Age of Onset  . Heart disease Mother   . Thyroid cancer Sister      History   Social History  . Marital Status: Married    Spouse Name: N/A    Number of Children: N/A  . Years of Education: N/A   Occupational History  . Retired    Social History Main Topics  . Smoking status: Never Smoker   . Smokeless tobacco: Never Used  . Alcohol Use: No  . Drug Use: No  . Sexually Active: Not on file   Other Topics Concern  . Not on file   Social History Narrative  . No narrative on file     BP 146/98  Pulse 73  Ht 5\' 2"  (1.575 m)  Wt 80.795 kg (178 lb 1.9 oz)  BMI 32.58  kg/m2  Physical Exam:  Well appearing middle aged woman, NAD HEENT: Unremarkable Neck:  No JVD, no thyromegally Lymphatics:  No adenopathy Back:  No CVA tenderness Lungs:  Clear with no wheezes, rales, or rhonchi HEART:  Regular rate rhythm, no murmurs, no rubs, no clicks Abd:  soft, positive bowel sounds, no organomegally, no rebound, no guarding Ext:  2 plus pulses, no edema, no cyanosis, no clubbing Skin:  No rashes no nodules Neuro:  CN II through XII intact, motor grossly intact  EKG NSR PVC's.  Assess/Plan:

## 2011-08-02 ENCOUNTER — Other Ambulatory Visit: Payer: Self-pay | Admitting: Internal Medicine

## 2011-08-05 ENCOUNTER — Other Ambulatory Visit: Payer: Self-pay | Admitting: Internal Medicine

## 2011-08-08 ENCOUNTER — Other Ambulatory Visit: Payer: Self-pay | Admitting: Cardiology

## 2011-08-08 MED ORDER — SPIRONOLACTONE 25 MG PO TABS: 25.0000 mg | ORAL_TABLET | Freq: Two times a day (BID) | ORAL | Status: DC

## 2011-08-21 ENCOUNTER — Ambulatory Visit: Payer: Self-pay | Admitting: Internal Medicine

## 2011-08-23 ENCOUNTER — Encounter: Payer: Self-pay | Admitting: Nurse Practitioner

## 2011-08-23 ENCOUNTER — Ambulatory Visit (INDEPENDENT_AMBULATORY_CARE_PROVIDER_SITE_OTHER): Payer: Self-pay | Admitting: Nurse Practitioner

## 2011-08-23 ENCOUNTER — Encounter: Payer: Self-pay | Admitting: Internal Medicine

## 2011-08-23 VITALS — BP 134/90 | HR 74 | Ht 62.0 in | Wt 189.8 lb

## 2011-08-23 DIAGNOSIS — I472 Ventricular tachycardia: Secondary | ICD-10-CM

## 2011-08-23 DIAGNOSIS — I10 Essential (primary) hypertension: Secondary | ICD-10-CM

## 2011-08-23 LAB — BASIC METABOLIC PANEL
BUN: 23 mg/dL (ref 6–23)
Calcium: 9.8 mg/dL (ref 8.4–10.5)
GFR: 65.93 mL/min (ref >60.00)
Glucose, Bld: 83 mg/dL (ref 70–99)
Potassium: 5.1 mEq/L (ref 3.5–5.1)

## 2011-08-23 MED ORDER — VERAPAMIL HCL 120 MG PO TABS: ORAL_TABLET | ORAL | Status: DC

## 2011-08-23 MED ORDER — FUROSEMIDE 40 MG PO TABS: ORAL_TABLET | ORAL | Status: DC

## 2011-08-23 MED ORDER — CARVEDILOL 25 MG PO TABS: 25.0000 mg | ORAL_TABLET | Freq: Two times a day (BID) | ORAL | Status: DC

## 2011-08-23 MED ORDER — SPIRONOLACTONE 25 MG PO TABS: 25.0000 mg | ORAL_TABLET | Freq: Two times a day (BID) | ORAL | Status: DC

## 2011-08-23 NOTE — Progress Notes (Signed)
   Patient Name: Michelle Hurley Date of Encounter: 08/23/2011  Primary Care Provider:  No primary provider on file. Primary Cardiologist:  G. Ladona Ridgel, MD  Patient Profile  61 y/o female w/ history of NSVT who presents for f/u.  Problem List   Past Medical History  Diagnosis Date  . Myopia   . Anxiety state, unspecified   . Morbid obesity   . Other and unspecified hyperlipidemia   . Unspecified essential hypertension   . Osteopenia   . Umbilical hernia   . Palpitations   . Arthritis   . H/O: hysterectomy   . NSVT (nonsustained ventricular tachycardia)     a. 01/2005 Echo: EF 55-65%   Past Surgical History  Procedure Date  . Abdominal hysterectomy     Allergies  Allergies  Allergen Reactions  . Codeine Nausea And Vomiting  . Prednisone Nausea And Vomiting    HPI  61 y/o female with the above problem list.  She was last seen in clinic in January.  Since then, she has been doing exceptionally well.  She denies palpitations, presyncope/syncope, pnd, orthopnea, n, v, dizziness, or edema.  She has been walking regularly with her sisters and denies DOE or chest pain.  Home Medications  Prior to Admission medications   Medication Sig Start Date End Date Taking? Authorizing Provider  carvedilol (COREG) 25 MG tablet Take 1 tablet (25 mg total) by mouth 2 (two) times daily with a meal. 08/23/11  Yes Ok Anis, NP  furosemide (LASIX) 40 MG tablet Take one in am and if still swollen in the afternoon take a second with potassium 08/23/11  Yes Ok Anis, NP  ibuprofen (ADVIL,MOTRIN) 200 MG tablet Take 200 mg by mouth every 6 (six) hours as needed.     Yes Historical Provider, MD  spironolactone (ALDACTONE) 25 MG tablet Take 1 tablet (25 mg total) by mouth 2 (two) times daily. 08/23/11  Yes Ok Anis, NP  verapamil (CALAN) 120 MG tablet Take 1/2 tablet by mouth three times a day 08/23/11  Yes Ok Anis, NP   Review of Systems  As above,  doing well.  No chest pain, sob, n, v, dizziness, syncope, edema, early satiety, dysuria, dark stools, blood in stools, diarrhea, rash/skin changes, fevers, chills, wt loss/gain.  Otherwise all systems reviewed and negative.  Physical Exam  Blood pressure 134/90, pulse 74, height 5\' 2"  (1.575 m), weight 189 lb 12.8 oz (86.093 kg), SpO2 97.00%.  General: Pleasant, NAD Psych: Normal affect. Neuro: Alert and oriented X 3. Moves all extremities spontaneously. HEENT: Normal  Neck: Supple without bruits or JVD. Lungs:  Resp regular and unlabored, CTA. Heart: RRR no s3, s4, or murmurs. Abdomen: Soft, non-tender, non-distended, BS + x 4.  Extremities: No clubbing, cyanosis or edema. DP/PT/Radials 2+ and equal bilaterally.  Assessment & Plan  1.  NSVT:  No recurrence that pt is aware of.  NL ef on last echo.  Cont bb/ccb.  2.  HTN:  Stable.  Refill spiro and check bmet today.  3.  Dispo:  F/u Dr. Ladona Ridgel in January.   Nicolasa Ducking, NP 08/23/2011, 5:26 PM

## 2011-08-23 NOTE — Patient Instructions (Signed)
Your physician recommends that you continue on your current medications as directed. Please refer to the Current Medication list given to you today. Your prescription have been sent into your pharmacy  Your physician recommends that you have lab work today: bmet  Your physician recommends that you schedule a follow-up appointment in: 5 months with Dr. Ladona Ridgel

## 2011-08-25 ENCOUNTER — Telehealth: Payer: Self-pay | Admitting: *Deleted

## 2011-08-25 NOTE — Telephone Encounter (Signed)
Message copied by Debbe Bales on Fri Aug 25, 2011  9:42 AM ------      Message from: Nicolasa Ducking R      Created: Wed Aug 23, 2011  5:38 PM       Potassium is on the high end of normal.  Continue current meds.  She will need repeat bmet when she sees GT back in January.

## 2011-08-25 NOTE — Telephone Encounter (Signed)
lmom labs ok 

## 2012-01-23 ENCOUNTER — Ambulatory Visit: Payer: Self-pay | Admitting: Internal Medicine

## 2012-01-25 ENCOUNTER — Ambulatory Visit: Payer: Self-pay | Admitting: Internal Medicine

## 2012-02-07 ENCOUNTER — Encounter: Payer: Self-pay | Admitting: Internal Medicine

## 2012-02-07 ENCOUNTER — Ambulatory Visit (INDEPENDENT_AMBULATORY_CARE_PROVIDER_SITE_OTHER): Payer: Self-pay | Admitting: Internal Medicine

## 2012-02-07 VITALS — BP 145/97 | HR 72 | Wt 189.4 lb

## 2012-02-07 DIAGNOSIS — I10 Essential (primary) hypertension: Secondary | ICD-10-CM

## 2012-02-07 DIAGNOSIS — I472 Ventricular tachycardia: Secondary | ICD-10-CM

## 2012-02-07 NOTE — Progress Notes (Signed)
HPI Mr. Mazzarella returns today for followup. She is a 62 year old woman with a history of nonsustained ventricular tachycardia, hypertension, and diastolic heart failure. Since I saw the patient last nearly a year ago, she has done well. She denies syncope or significant palpitations.she notes some difficulty with her memory. Allergies  Allergen Reactions  . Codeine Nausea And Vomiting  . Prednisone Nausea And Vomiting     Current Outpatient Prescriptions  Medication Sig Dispense Refill  . carvedilol (COREG) 25 MG tablet Take 1 tablet (25 mg total) by mouth 2 (two) times daily with a meal.  60 tablet  11  . furosemide (LASIX) 40 MG tablet Take one in am and if still swollen in the afternoon take a second with potassium  60 tablet  11  . ibuprofen (ADVIL,MOTRIN) 200 MG tablet Take 200 mg by mouth every 6 (six) hours as needed.        . naproxen sodium (ANAPROX) 220 MG tablet Take 220 mg by mouth 3 (three) times daily with meals.      Marland Kitchen spironolactone (ALDACTONE) 25 MG tablet Take 1 tablet (25 mg total) by mouth 2 (two) times daily.  60 tablet  11  . verapamil (CALAN) 120 MG tablet Take 1/2 tablet by mouth three times a day  45 tablet  11     Past Medical History  Diagnosis Date  . Myopia   . Anxiety state, unspecified   . Morbid obesity   . Other and unspecified hyperlipidemia   . Unspecified essential hypertension   . Osteopenia   . Umbilical hernia   . Palpitations   . Arthritis   . H/O: hysterectomy   . NSVT (nonsustained ventricular tachycardia)     a. 01/2005 Echo: EF 55-65%    ROS:   All systems reviewed and negative except as noted in the HPI.   Past Surgical History  Procedure Date  . Abdominal hysterectomy      Family History  Problem Relation Age of Onset  . Heart disease Mother   . Thyroid cancer Sister      History   Social History  . Marital Status: Married    Spouse Name: N/A    Number of Children: N/A  . Years of Education: N/A   Occupational  History  . Retired    Social History Main Topics  . Smoking status: Never Smoker   . Smokeless tobacco: Never Used  . Alcohol Use: No  . Drug Use: No  . Sexually Active: Not on file   Other Topics Concern  . Not on file   Social History Narrative  . No narrative on file     BP 145/97  Pulse 72  Wt 189 lb 6.4 oz (85.911 kg)  Physical Exam:  Well appearing 62 year old woman, NAD HEENT: Unremarkable Neck:  7 cm JVD, no thyromegally Lungs:  Clear with no wheezes, rales, or rhonchi. HEART:  Regular rate rhythm, no murmurs, no rubs, no clicks Abd:  soft, positive bowel sounds, no organomegally, no rebound, no guarding Ext:  2 plus pulses, no edema, no cyanosis, no clubbing Skin:  No rashes no nodules Neuro:  CN II through XII intact, motor grossly intact  EKG Normal sinus rhythm with nonspecific T wave abnormality  Assess/Plan:

## 2012-02-07 NOTE — Assessment & Plan Note (Signed)
Her blood pressure is minimally elevated. I've asked the patient to lose weight, and reduce her salt intake. She has gained approximately 10 pounds in the last year.

## 2012-02-07 NOTE — Patient Instructions (Addendum)
Your physician wants you to follow-up in: 12 months with Dr. Taylor. You will receive a reminder letter in the mail two months in advance. If you don't receive a letter, please call our office to schedule the follow-up appointment.    

## 2012-02-07 NOTE — Assessment & Plan Note (Signed)
She has had no symptomatic ventricular arrhythmias. She will continue her combination therapy with beta blockers and calcium channel blockers.

## 2012-04-05 ENCOUNTER — Encounter (HOSPITAL_COMMUNITY): Payer: Self-pay | Admitting: Emergency Medicine

## 2012-04-05 ENCOUNTER — Observation Stay (HOSPITAL_COMMUNITY)
Admission: EM | Admit: 2012-04-05 | Discharge: 2012-04-07 | Disposition: A | Discharge status: Home / Self Care | Payer: MEDICAID | Attending: Internal Medicine | Admitting: Internal Medicine

## 2012-04-05 DIAGNOSIS — Z90710 Acquired absence of both cervix and uterus: Secondary | ICD-10-CM | POA: Insufficient documentation

## 2012-04-05 DIAGNOSIS — I10 Essential (primary) hypertension: Secondary | ICD-10-CM | POA: Diagnosis present

## 2012-04-05 DIAGNOSIS — I672 Cerebral atherosclerosis: Secondary | ICD-10-CM | POA: Insufficient documentation

## 2012-04-05 DIAGNOSIS — R197 Diarrhea, unspecified: Secondary | ICD-10-CM

## 2012-04-05 DIAGNOSIS — I472 Ventricular tachycardia, unspecified: Secondary | ICD-10-CM | POA: Diagnosis present

## 2012-04-05 DIAGNOSIS — R5381 Other malaise: Secondary | ICD-10-CM | POA: Insufficient documentation

## 2012-04-05 DIAGNOSIS — M949 Disorder of cartilage, unspecified: Secondary | ICD-10-CM | POA: Insufficient documentation

## 2012-04-05 DIAGNOSIS — G459 Transient cerebral ischemic attack, unspecified: Secondary | ICD-10-CM

## 2012-04-05 DIAGNOSIS — R112 Nausea with vomiting, unspecified: Secondary | ICD-10-CM

## 2012-04-05 DIAGNOSIS — I7389 Other specified peripheral vascular diseases: Secondary | ICD-10-CM | POA: Insufficient documentation

## 2012-04-05 DIAGNOSIS — M899 Disorder of bone, unspecified: Secondary | ICD-10-CM | POA: Insufficient documentation

## 2012-04-05 DIAGNOSIS — E785 Hyperlipidemia, unspecified: Secondary | ICD-10-CM | POA: Diagnosis present

## 2012-04-05 DIAGNOSIS — R42 Dizziness and giddiness: Secondary | ICD-10-CM | POA: Diagnosis present

## 2012-04-05 DIAGNOSIS — I4729 Other ventricular tachycardia: Secondary | ICD-10-CM | POA: Insufficient documentation

## 2012-04-05 DIAGNOSIS — Z8673 Personal history of transient ischemic attack (TIA), and cerebral infarction without residual deficits: Secondary | ICD-10-CM | POA: Diagnosis present

## 2012-04-05 DIAGNOSIS — Z79899 Other long term (current) drug therapy: Secondary | ICD-10-CM | POA: Insufficient documentation

## 2012-04-05 DIAGNOSIS — R002 Palpitations: Secondary | ICD-10-CM | POA: Insufficient documentation

## 2012-04-05 DIAGNOSIS — K529 Noninfective gastroenteritis and colitis, unspecified: Secondary | ICD-10-CM | POA: Diagnosis present

## 2012-04-05 DIAGNOSIS — I080 Rheumatic disorders of both mitral and aortic valves: Secondary | ICD-10-CM | POA: Insufficient documentation

## 2012-04-05 DIAGNOSIS — Z8679 Personal history of other diseases of the circulatory system: Secondary | ICD-10-CM

## 2012-04-05 DIAGNOSIS — K5289 Other specified noninfective gastroenteritis and colitis: Principal | ICD-10-CM | POA: Insufficient documentation

## 2012-04-05 DIAGNOSIS — M2550 Pain in unspecified joint: Secondary | ICD-10-CM

## 2012-04-05 NOTE — ED Notes (Signed)
Patient is alert and oriented x3.  She states that she started vomiting at 8am yesterday with vomiting.   She has been unable to keep any food or liquid.  Patient denies any pain.  She states she does have  Vertigo with vomiting.

## 2012-04-05 NOTE — ED Notes (Signed)
Pt c/o generalized weakness, fever, n/v/d, chills, fever, dizziness onset yesterday morning.

## 2012-04-05 NOTE — ED Provider Notes (Signed)
History     CSN: [id number]  Arrival date & time 04/05/12  2303   First MD Initiated Contact with Patient 04/05/12 2355      Chief Complaint  Patient presents with  . Weakness    HPI Michelle Hurley is a 62 y.o. female who is presenting with 48 hours of nausea vomiting diarrhea occasional fevers, and vertigo symptoms. Over the course of the day yesterday, patient had multiple episodes of nausea and nonbilious, nonbloody vomiting and diarrhea.  This morning she was able to be breakfast, she's able to eat lunch and they both state down. Patient ate approximately 2100 tonight and was watching TV when she had the sudden onset that the room is spinning to the right, this is associated with some nausea. Patient was able to get to her bedroom to lay down or she said when she closed her eyes the symptoms got better somewhat however they recurred every time that she opened her eyes. She said moving her body or moving her head did not make the symptoms worse. Symptoms started a little after 9 and they were persistent until about 10:45 when family arrived to help her. Patient was helped to the bathroom where she had an episode of vomiting and diarrhea. During the episode, she also felt hot and turned her air conditioner on. She denies any chest pain, shortness of breath, abdominal pain.  Patient says she was leaning to the right and at balance issues when She was having vertigo.  Patient's symptoms have been intermittent, severe, no other alleviating or exacerbating factors and no other associated symptoms.  Patient's family history is pertinent for mother and father both having CVA. Patient sees Dr. Ladona Ridgel - for hypertension, hyperlipidemia, nonsustained ventricular tachycardia which has not recurred.  Review of Systems  Constitutional: Positive for chills.       Feeling hot, and sweaty  HENT: Negative for ear pain, congestion and neck pain.   Eyes: Negative for blurred vision and double vision.   Respiratory: Negative for cough, hemoptysis, sputum production, shortness of breath and wheezing.   Cardiovascular: Negative for chest pain, palpitations, orthopnea and leg swelling.  Gastrointestinal: Positive for nausea, vomiting and diarrhea. Negative for abdominal pain, constipation and blood in stool.  Genitourinary: Negative for dysuria and frequency.  Musculoskeletal: Negative for myalgias and back pain.  Skin: Negative for rash.  Neurological: Positive for dizziness and weakness. Negative for speech change, focal weakness, loss of consciousness and headaches.       Tingling on and off in fingers, chronic  Psychiatric/Behavioral: Negative for depression. The patient is not nervous/anxious.      Past Medical History  Diagnosis Date  . Myopia   . Anxiety state, unspecified   . Morbid obesity   . Other and unspecified hyperlipidemia   . Unspecified essential hypertension   . Osteopenia   . Umbilical hernia   . Palpitations   . Arthritis   . H/O: hysterectomy   . NSVT (nonsustained ventricular tachycardia)     a. 01/2005 Echo: EF 55-65%    Past Surgical History  Procedure Laterality Date  . Abdominal hysterectomy      Family History  Problem Relation Age of Onset  . Heart disease Mother   . Thyroid cancer Sister     History  Substance Use Topics  . Smoking status: Never Smoker   . Smokeless tobacco: Never Used  . Alcohol Use: No    OB History   Grav Para Term Preterm Abortions TAB  SAB Ect Mult Living                  Review of Systems  Constitutional: Positive for chills.       Feeling hot, and sweaty  HENT: Negative for ear pain, congestion and neck pain.   Eyes: Negative for blurred vision and double vision.  Respiratory: Negative for cough, hemoptysis, sputum production, shortness of breath and wheezing.   Cardiovascular: Negative for chest pain, palpitations, orthopnea and leg swelling.  Gastrointestinal: Positive for nausea, vomiting and diarrhea.  Negative for abdominal pain, constipation and blood in stool.  Genitourinary: Negative for dysuria and frequency.  Musculoskeletal: Negative for myalgias and back pain.  Skin: Negative for rash.  Neurological: Positive for dizziness and weakness. Negative for speech change, focal weakness, loss of consciousness and headaches.       Tingling on and off in fingers, chronic  Psychiatric/Behavioral: Negative for depression. The patient is not nervous/anxious.     Allergies  Codeine and Prednisone  Home Medications   Current Outpatient Rx  Name  Route  Sig  Dispense  Refill  . carvedilol (COREG) 25 MG tablet   Oral   Take 1 tablet (25 mg total) by mouth 2 (two) times daily with a meal.   60 tablet   11   . furosemide (LASIX) 40 MG tablet      Take one in am and if still swollen in the afternoon take a second with potassium   60 tablet   11   . ibuprofen (ADVIL,MOTRIN) 200 MG tablet   Oral   Take 200 mg by mouth every 6 (six) hours as needed.           . naproxen sodium (ANAPROX) 220 MG tablet   Oral   Take 220 mg by mouth 3 (three) times daily with meals.         Marland Kitchen spironolactone (ALDACTONE) 25 MG tablet   Oral   Take 1 tablet (25 mg total) by mouth 2 (two) times daily.   60 tablet   11   . verapamil (CALAN) 120 MG tablet      Take 1/2 tablet by mouth three times a day   45 tablet   11     BP 168/99  Pulse 73  Temp(Src) 97.8 F (36.6 C) (Oral)  Resp 20  SpO2 100%  LMP 04/05/2012  Physical Exam  PHYSICAL EXAM: VITAL SIGNS:  . Filed Vitals:   04/05/12 2310 04/06/12 0030 04/06/12 0031 04/06/12 0032  BP: 168/99 145/90 171/95 156/95  Pulse: 73 78 81 84  Temp: 97.8 F (36.6 C)     TempSrc: Oral     Resp: 20     SpO2: 100%      CONSTITUTIONAL: Awake, oriented, appears non-toxic HENT: Atraumatic, normocephalic, oral mucosa pink and moist, airway patent. Nares patent without drainage. External ears normal. EYES: Conjunctiva clear, EOMI, PERRLA NECK:  Trachea midline, non-tender, supple CARDIOVASCULAR: Normal heart rate, Normal rhythm, No murmurs, rubs, gallops PULMONARY/CHEST: Clear to auscultation, no rhonchi, wheezes, or rales. Symmetrical breath sounds. CHEST WALL: No lesions. Non-tender. ABDOMINAL: Non-distended, obese, soft, non-tender - no rebound or guarding.  BS normal. NEUROLOGIC: [id number] fields intact. PERRLA, EOMI.  Facial sensation equal to light touch bilaterally.  Good muscle bulk in the masseter muscle and good lateral movement of the jaw.  Facial expressions equal and good strength with smile/frown and puffed cheeks.  Hearing grossly intact to finger rub test.  Uvula, tongue are midline with  no deviation. Symmetrical palate elevation.  Trapezius and SCM muscles are 5/5 strength bilaterally.   DTR: Brachioradialis, biceps, patellar, Achilles tendon reflexes 2+ bilaterally.  No clonus. Strength: 5/5 strength flexors and extensors in the upper and lower extremities.  Grip strength, finger adduction/abduction 5/5. Sensation: Sensation intact distally to light touch, proprioception using position testing of 2nd digit and great toe Cerebellar: No ataxia with walking or dysmetria with finger to nose, rapid alternating hand movements and heels to shin testing. Unable to elicit nystagmus or vertigo with rapid head movements while patient supine, Dix-Hallpike, oral patient rolling onto both left and right lateral decubitus positions Gait and Station: Able to ambulate unassisted per nursing EXTREMITIES: No clubbing, cyanosis, or edema SKIN: Warm, Dry, No erythema, No rash   ED Course  Procedures (including critical care time)  Date: 04/06/2012  Rate: 82  Rhythm: normal sinus rhythm  QRS Axis: normal  Intervals: normal  ST/T Wave abnormalities: normal  Conduction Disutrbances: none  Narrative Interpretation: unremarkable, no significant change compared with prior     Labs Reviewed  COMPREHENSIVE METABOLIC PANEL - Abnormal;  Notable for the following:    GFR calc non Af Amer 53 (*)    GFR calc Af Amer 61 (*)    All other components within normal limits  CBC WITH DIFFERENTIAL  URINALYSIS, ROUTINE W REFLEX MICROSCOPIC   Ct Head Wo Contrast  04/06/2012  *RADIOLOGY REPORT*  Clinical Data: Dizziness and emesis.  CT HEAD WITHOUT CONTRAST  Technique:  Contiguous axial images were obtained from the base of the skull through the vertex without contrast.  Comparison: No priors.  Findings: Well-defined focus of decreased attenuation in the inferior aspect of the left putamen is compatible with an old lacunar infarction.  Some patchy and confluent areas of decreased attenuation throughout the deep and periventricular white matter of the cerebral hemispheres bilaterally compatible with mild chronic microvascular ischemic disease. No acute intracranial abnormalities.  Specifically, no evidence of acute intracranial hemorrhage, no definite findings of acute/subacute cerebral ischemia, no mass, mass effect, hydrocephalus or abnormal intra or extra-axial fluid collections.  Visualized paranasal sinuses and mastoids are well pneumatized.  No acute displaced skull fractures are identified.  IMPRESSION: 1.  No acute intracranial abnormalities. 2.  Mild chronic microvascular ischemic changes in the cerebral white matter and old lacunar infarction in the inferior aspect of the left putamen.   Original Report Authenticated By: Trudie Reed, M.D.     1. Vertigo   2. Hypertension   3. Hyperlipemia   4. Nausea and vomiting   5. Diarrhea       MDM  FRANCILLE WITTMANN is a 62 y.o. female presenting with nausea vomiting diarrhea and vertigo. Patient's presentation yesterday consistent with acute gastroenteritis, my concern is that this patient also has had an episode of vertigo which does not appear to be peripheral. Vital signs are stable and within normal limits. Patient's mother and father have a history of CVA, patient has history of  hyperlipidemia and hypertension.  My main concern, this patient has some mild volume depletion and is having vertebrobasilar TIA, obtain CT basic lab work.  Patient will need admission for MRI and further risk stratification.  Patient's laboratory workup is unremarkable, CT shows chronic ischemic disease, no acute infarcts or bleeding. Patient is able to ambulate without symptoms.  On reassessment, patient says she's had some intermittent symptoms of vertigo but they have past. Again, they have not been associated with any movement.  I feel there is  a strong enough concern for a central cause of this patient's vertigo, and discussed the patient's case with Dr. Lovell Sheehan for admission. Stable for admission.       Jones Skene, MD 04/06/12 9562

## 2012-04-06 ENCOUNTER — Observation Stay (HOSPITAL_COMMUNITY): Payer: Self-pay

## 2012-04-06 ENCOUNTER — Emergency Department (HOSPITAL_COMMUNITY): Payer: Self-pay

## 2012-04-06 ENCOUNTER — Encounter (HOSPITAL_COMMUNITY): Payer: Self-pay | Admitting: *Deleted

## 2012-04-06 DIAGNOSIS — K5289 Other specified noninfective gastroenteritis and colitis: Secondary | ICD-10-CM

## 2012-04-06 DIAGNOSIS — E785 Hyperlipidemia, unspecified: Secondary | ICD-10-CM

## 2012-04-06 DIAGNOSIS — K529 Noninfective gastroenteritis and colitis, unspecified: Secondary | ICD-10-CM | POA: Diagnosis present

## 2012-04-06 DIAGNOSIS — G459 Transient cerebral ischemic attack, unspecified: Secondary | ICD-10-CM

## 2012-04-06 DIAGNOSIS — Z8673 Personal history of transient ischemic attack (TIA), and cerebral infarction without residual deficits: Secondary | ICD-10-CM | POA: Diagnosis present

## 2012-04-06 DIAGNOSIS — I10 Essential (primary) hypertension: Secondary | ICD-10-CM

## 2012-04-06 DIAGNOSIS — I059 Rheumatic mitral valve disease, unspecified: Secondary | ICD-10-CM

## 2012-04-06 DIAGNOSIS — R42 Dizziness and giddiness: Secondary | ICD-10-CM

## 2012-04-06 HISTORY — DX: Dizziness and giddiness: R42

## 2012-04-06 HISTORY — DX: Personal history of transient ischemic attack (TIA), and cerebral infarction without residual deficits: Z86.73

## 2012-04-06 LAB — URINALYSIS, ROUTINE W REFLEX MICROSCOPIC
Bilirubin Urine: NEGATIVE
Hgb urine dipstick: NEGATIVE
Ketones, ur: NEGATIVE mg/dL
Nitrite: NEGATIVE
pH: 7 (ref 5.0–8.0)

## 2012-04-06 LAB — COMPREHENSIVE METABOLIC PANEL
ALT: 18 U/L (ref 0–35)
AST: 18 U/L (ref 0–37)
Calcium: 9.8 mg/dL (ref 8.4–10.5)
Creatinine, Ser: 1.09 mg/dL (ref 0.50–1.10)
GFR calc non Af Amer: 53 mL/min — ABNORMAL LOW (ref >90)
Sodium: 135 mEq/L (ref 135–145)
Total Protein: 8.1 g/dL (ref 6.0–8.3)

## 2012-04-06 LAB — CBC WITH DIFFERENTIAL/PLATELET
Basophils Absolute: 0 10*3/uL (ref 0.0–0.1)
Eosinophils Relative: 0 % (ref 0–5)
HCT: 41.8 % (ref 36.0–46.0)
Lymphocytes Relative: 21 % (ref 12–46)
MCHC: 32.5 g/dL (ref 30.0–36.0)
MCV: 82.3 fL (ref 78.0–100.0)
Monocytes Absolute: 0.5 10*3/uL (ref 0.1–1.0)
Monocytes Relative: 6 % (ref 3–12)
RDW: 14.5 % (ref 11.5–15.5)

## 2012-04-06 LAB — GLUCOSE, CAPILLARY
Glucose-Capillary: 112 mg/dL — ABNORMAL HIGH (ref 70–99)
Glucose-Capillary: 91 mg/dL (ref 70–99)

## 2012-04-06 LAB — HEMOGLOBIN A1C: Mean Plasma Glucose: 137 mg/dL — ABNORMAL HIGH (ref <117)

## 2012-04-06 LAB — RAPID URINE DRUG SCREEN, HOSP PERFORMED
Amphetamines: NOT DETECTED
Barbiturates: NOT DETECTED

## 2012-04-06 MED ORDER — VERAPAMIL HCL 80 MG PO TABS
80.0000 mg | ORAL_TABLET | Freq: Two times a day (BID) | ORAL | Status: DC
  Filled 2012-04-06: qty 1

## 2012-04-06 MED ORDER — ALUM & MAG HYDROXIDE-SIMETH 200-200-20 MG/5ML PO SUSP: 30.0000 mL | Freq: Four times a day (QID) | ORAL | Status: DC | PRN

## 2012-04-06 MED ORDER — HYDRALAZINE HCL 20 MG/ML IJ SOLN
10.0000 mg | Freq: Four times a day (QID) | INTRAMUSCULAR | Status: DC | PRN
  Administered 2012-04-06: 10 mg via INTRAVENOUS
  Filled 2012-04-06: qty 0.5
  Filled 2012-04-06: qty 1

## 2012-04-06 MED ORDER — ONDANSETRON HCL 4 MG PO TABS: 4.0000 mg | ORAL_TABLET | Freq: Four times a day (QID) | ORAL | Status: DC | PRN

## 2012-04-06 MED ORDER — SODIUM CHLORIDE 0.9 % IV BOLUS (SEPSIS)
1000.0000 mL | Freq: Once | INTRAVENOUS | Status: AC
  Administered 2012-04-06: 1000 mL via INTRAVENOUS

## 2012-04-06 MED ORDER — SODIUM CHLORIDE 0.9 % IV SOLN
INTRAVENOUS | Status: DC
  Administered 2012-04-06 (×2): via INTRAVENOUS

## 2012-04-06 MED ORDER — ACETAMINOPHEN 650 MG RE SUPP: 650.0000 mg | Freq: Four times a day (QID) | RECTAL | Status: DC | PRN

## 2012-04-06 MED ORDER — SODIUM CHLORIDE 0.9 % IJ SOLN
3.0000 mL | Freq: Two times a day (BID) | INTRAMUSCULAR | Status: DC
  Administered 2012-04-06: 3 mL via INTRAVENOUS

## 2012-04-06 MED ORDER — ONDANSETRON HCL 4 MG/2ML IJ SOLN: 4.0000 mg | Freq: Three times a day (TID) | INTRAMUSCULAR | Status: DC | PRN

## 2012-04-06 MED ORDER — DIAZEPAM 2 MG PO TABS: 2.0000 mg | ORAL_TABLET | Freq: Two times a day (BID) | ORAL | Status: DC | PRN

## 2012-04-06 MED ORDER — VERAPAMIL HCL 80 MG PO TABS
80.0000 mg | ORAL_TABLET | Freq: Two times a day (BID) | ORAL | Status: DC
  Administered 2012-04-06 – 2012-04-07 (×2): 80 mg via ORAL
  Filled 2012-04-06 (×4): qty 1

## 2012-04-06 MED ORDER — ACETAMINOPHEN 325 MG PO TABS: 650.0000 mg | ORAL_TABLET | Freq: Four times a day (QID) | ORAL | Status: DC | PRN

## 2012-04-06 MED ORDER — ENOXAPARIN SODIUM 40 MG/0.4ML ~~LOC~~ SOLN
40.0000 mg | SUBCUTANEOUS | Status: DC
  Administered 2012-04-06 – 2012-04-07 (×2): 40 mg via SUBCUTANEOUS
  Filled 2012-04-06 (×3): qty 0.4

## 2012-04-06 MED ORDER — OXYCODONE HCL 5 MG PO TABS: 5.0000 mg | ORAL_TABLET | ORAL | Status: DC | PRN

## 2012-04-06 MED ORDER — CARVEDILOL 25 MG PO TABS
25.0000 mg | ORAL_TABLET | Freq: Two times a day (BID) | ORAL | Status: DC
  Filled 2012-04-06 (×3): qty 1

## 2012-04-06 MED ORDER — CARVEDILOL 25 MG PO TABS
25.0000 mg | ORAL_TABLET | Freq: Two times a day (BID) | ORAL | Status: DC
  Administered 2012-04-06 – 2012-04-07 (×2): 25 mg via ORAL
  Filled 2012-04-06 (×5): qty 1

## 2012-04-06 MED ORDER — MECLIZINE HCL 25 MG PO TABS
25.0000 mg | ORAL_TABLET | Freq: Three times a day (TID) | ORAL | Status: DC
  Administered 2012-04-06 – 2012-04-07 (×4): 25 mg via ORAL
  Filled 2012-04-06 (×6): qty 1

## 2012-04-06 MED ORDER — ZOLPIDEM TARTRATE 5 MG PO TABS: 5.0000 mg | ORAL_TABLET | Freq: Every evening | ORAL | Status: DC | PRN

## 2012-04-06 MED ORDER — ONDANSETRON HCL 4 MG/2ML IJ SOLN: 4.0000 mg | Freq: Four times a day (QID) | INTRAMUSCULAR | Status: DC | PRN

## 2012-04-06 MED ORDER — HYDROMORPHONE HCL PF 1 MG/ML IJ SOLN: 0.5000 mg | INTRAMUSCULAR | Status: DC | PRN

## 2012-04-06 NOTE — Progress Notes (Signed)
[facility]                                                                                Patient Demographics  Michelle Hurley, is a 62 y.o. female, DOB - 15-Mar-1950, ZOX:[id number], WJX:[id number]  Admit date - 04/05/2012  Admitting Physician Ron Parker, MD  Outpatient Primary MD for the patient is No primary provider on file.  LOS - 1   Chief Complaint  Patient presents with  . Weakness        Assessment & Plan    1. Vertigo with nausea vomiting. Likely due to benign positional vertigo, no focal neurological deficits, no headache, is feeling better currently no GI symptoms, will place her on schedule meclizine, counseled not to move her head quickly from side to side, PT OT to evaluate for balance strategies. MRI of the brain unremarkable. Carotid ultrasound unremarkable. Pending echogram.    2. Hypertension home medications resume and monitor.    3. History of obesity. Outpatient PCP followup for diet and exercise regimen, counseled on exercise and heart healthy diet.     Code Status: Full  Family Communication: Patient and family bedside  Disposition Plan: Home   Procedures maria MRA of the brain, echo gram, carotid duplex   Consults  PT-OT   DVT Prophylaxis  Lovenox   Lab Results  Component Value Date   PLT 256 04/06/2012    Medications  Scheduled Meds: . carvedilol  25 mg Oral BID WC  . enoxaparin (LOVENOX) injection  40 mg Subcutaneous Q24H  . meclizine  25 mg Oral TID  . sodium chloride  3 mL Intravenous Q12H  . verapamil  80 mg Oral Q12H   Continuous Infusions: . sodium chloride 75 mL/hr at 04/06/12 0627   PRN Meds:.acetaminophen, acetaminophen, alum & mag hydroxide-simeth, hydrALAZINE, HYDROmorphone (DILAUDID) injection, ondansetron (ZOFRAN) IV, ondansetron (ZOFRAN) IV, ondansetron, oxyCODONE, zolpidem  Antibiotics    Anti-infectives   None       Time Spent in minutes   35   SINGH,PRASHANT K M.D on  04/06/2012 at 10:37 AM  Between 7am to 7pm - Pager - [phone]  After 7pm go to [email or web address] - password [email or web address]  And look for the night coverage person covering for me after hours  [facility] Office  [phone]    Subjective:   Michelle Hurley today has, No headache, No chest pain, No abdominal pain - No Nausea, No new weakness tingling or numbness, No Cough - SOB.   + vertigo worse if moves her head  Objective:   Filed Vitals:   04/06/12 0623 04/06/12 0703 04/06/12 0704 04/06/12 0706  BP: 179/102 139/67 149/89 134/94  Pulse: 77 89 91 100  Temp: 97.8 F (36.6 C)     TempSrc: Oral     Resp: 20     Height: 5\' 2"  (1.575 m)     Weight: 83.9 kg (184 lb 15.5 oz)     SpO2: 100%       Wt Readings from Last 3 Encounters:  04/06/12 83.9 kg (184 lb 15.5 oz)  02/07/12 85.911 kg (189 lb 6.4 oz)  08/23/11 86.093 kg (189 lb 12.8 oz)  Intake/Output Summary (Last 24 hours) at 04/06/12 1037 Last data filed at 04/06/12 1025  Gross per 24 hour  Intake  41.25 ml  Output    250 ml  Net -208.75 ml    Exam Awake Alert, Oriented X 3, No new F.N deficits, Normal affect [place].AT,PERRAL Supple Neck,No JVD, No cervical lymphadenopathy appriciated.  Symmetrical Chest wall movement, Good air movement bilaterally, CTAB RRR,No Gallops,Rubs or new Murmurs, No Parasternal Heave +ve B.Sounds, Abd Soft, Non tender, No organomegaly appriciated, No rebound - guarding or rigidity. No Cyanosis, Clubbing or edema, No new Rash or bruise     Data Review   Micro Results No results found for this or any previous visit (from the past 240 hour(s)).  Radiology Reports Ct Head Wo Contrast  04/06/2012  *RADIOLOGY REPORT*  Clinical Data: Dizziness and emesis.  CT HEAD WITHOUT CONTRAST  Technique:  Contiguous axial images were obtained from the base of the skull through the vertex without contrast.  Comparison: No priors.  Findings: Well-defined focus of decreased attenuation in the  inferior aspect of the left putamen is compatible with an old lacunar infarction.  Some patchy and confluent areas of decreased attenuation throughout the deep and periventricular white matter of the cerebral hemispheres bilaterally compatible with mild chronic microvascular ischemic disease. No acute intracranial abnormalities.  Specifically, no evidence of acute intracranial hemorrhage, no definite findings of acute/subacute cerebral ischemia, no mass, mass effect, hydrocephalus or abnormal intra or extra-axial fluid collections.  Visualized paranasal sinuses and mastoids are well pneumatized.  No acute displaced skull fractures are identified.  IMPRESSION: 1.  No acute intracranial abnormalities. 2.  Mild chronic microvascular ischemic changes in the cerebral white matter and old lacunar infarction in the inferior aspect of the left putamen.   Original Report Authenticated By: Trudie Reed, M.D.    Mri Brain Without Contrast  04/06/2012  *RADIOLOGY REPORT*  Clinical Data:  TIA.  Dizziness.  Generalized weakness.  MRI HEAD WITHOUT CONTRAST MRA HEAD WITHOUT CONTRAST  Technique:  Multiplanar, multiecho pulse sequences of the brain and surrounding structures were obtained without intravenous contrast. Angiographic images of the head were obtained using MRA technique without contrast.  Comparison:  CT head without contrast 04/06/2012.  MRI HEAD  Findings:  The diffusion weighted images demonstrate no evidence for acute or subacute infarction.  Scattered subcortical T2 hyperintensities are present bilaterally.  White matter changes extend into the brainstem.  These are advanced for age.  The flow is present major intracranial arteries.  The ventricles are of normal size.  No significant extra-axial fluid collection is present.  The globes and orbits are intact.  The paranasal sinuses and mastoid air cells are clear.  IMPRESSION:  1.  No evidence for acute or subacute infarction. 2.  Scattered white matter disease  is advanced for age. The finding is nonspecific but can be seen in the setting of chronic microvascular ischemia, a demyelinating process such as multiple sclerosis, vasculitis, complicated migraine headaches, or as the sequelae of a prior infectious or inflammatory process.  MRA HEAD  Findings: The internal carotid arteries are within normal limits from the high cervical segments through the ICA termini bilaterally.  Mild irregularity is present in the right M1 segment. The right A1 segment is normal. The anterior communicating artery is patent.  The proximal left A1 segments is absent.  There is a distal stump, suggesting this is an occluded vessel.  Left M1 segment is normal.  Segmental irregularity is present within the MCA branch  vessels bilaterally.  The left vertebral artery is slightly dominant to the right.  The left PICA origin is visualized and normal.  There is mild irregularity within the basilar artery.  Both posterior cerebral arteries originate from basilar tip.  A moderate proximal stenosis is present on the right.  Segmental narrowing of PCA branch vessels is worse right than left.  IMPRESSION:  1.  Mild to moderate small vessel disease is advanced for age. 2.  Occlusion of the left A1 segment with a small distal stump. 3.  The anterior communicating artery is patent. 4.  Moderate proximal stenosis of the right PCA. 5.  MCA small vessel disease is worse right than left.   Original Report Authenticated By: Marin Roberts, M.D.    Mr Mra Head/brain Wo Cm  04/06/2012  *RADIOLOGY REPORT*  Clinical Data:  TIA.  Dizziness.  Generalized weakness.  MRI HEAD WITHOUT CONTRAST MRA HEAD WITHOUT CONTRAST  Technique:  Multiplanar, multiecho pulse sequences of the brain and surrounding structures were obtained without intravenous contrast. Angiographic images of the head were obtained using MRA technique without contrast.  Comparison:  CT head without contrast 04/06/2012.  MRI HEAD  Findings:  The  diffusion weighted images demonstrate no evidence for acute or subacute infarction.  Scattered subcortical T2 hyperintensities are present bilaterally.  White matter changes extend into the brainstem.  These are advanced for age.  The flow is present major intracranial arteries.  The ventricles are of normal size.  No significant extra-axial fluid collection is present.  The globes and orbits are intact.  The paranasal sinuses and mastoid air cells are clear.  IMPRESSION:  1.  No evidence for acute or subacute infarction. 2.  Scattered white matter disease is advanced for age. The finding is nonspecific but can be seen in the setting of chronic microvascular ischemia, a demyelinating process such as multiple sclerosis, vasculitis, complicated migraine headaches, or as the sequelae of a prior infectious or inflammatory process.  MRA HEAD  Findings: The internal carotid arteries are within normal limits from the high cervical segments through the ICA termini bilaterally.  Mild irregularity is present in the right M1 segment. The right A1 segment is normal. The anterior communicating artery is patent.  The proximal left A1 segments is absent.  There is a distal stump, suggesting this is an occluded vessel.  Left M1 segment is normal.  Segmental irregularity is present within the MCA branch vessels bilaterally.  The left vertebral artery is slightly dominant to the right.  The left PICA origin is visualized and normal.  There is mild irregularity within the basilar artery.  Both posterior cerebral arteries originate from basilar tip.  A moderate proximal stenosis is present on the right.  Segmental narrowing of PCA branch vessels is worse right than left.  IMPRESSION:  1.  Mild to moderate small vessel disease is advanced for age. 2.  Occlusion of the left A1 segment with a small distal stump. 3.  The anterior communicating artery is patent. 4.  Moderate proximal stenosis of the right PCA. 5.  MCA small vessel disease  is worse right than left.   Original Report Authenticated By: Marin Roberts, M.D.     CBC  Recent Labs Lab 04/06/12 0030  WBC 7.9  HGB 13.6  HCT 41.8  PLT 256  MCV 82.3  MCH 26.8  MCHC 32.5  RDW 14.5  LYMPHSABS 1.6  MONOABS 0.5  EOSABS 0.0  BASOSABS 0.0    Chemistries   Recent Labs  Lab 04/06/12 0030  NA 135  K 4.0  CL 100  CO2 27  GLUCOSE 97  BUN 20  CREATININE 1.09  CALCIUM 9.8  AST 18  ALT 18  ALKPHOS 60  BILITOT 0.3   ------------------------------------------------------------------------------------------------------------------ estimated creatinine clearance is 53 ml/min (by C-G formula based on Cr of 1.09). ------------------------------------------------------------------------------------------------------------------ No results found for this basename: HGBA1C,  in the last 72 hours ------------------------------------------------------------------------------------------------------------------ No results found for this basename: CHOL, HDL, LDLCALC, TRIG, CHOLHDL, LDLDIRECT,  in the last 72 hours ------------------------------------------------------------------------------------------------------------------ No results found for this basename: TSH, T4TOTAL, FREET3, T3FREE, THYROIDAB,  in the last 72 hours ------------------------------------------------------------------------------------------------------------------ No results found for this basename: VITAMINB12, FOLATE, FERRITIN, TIBC, IRON, RETICCTPCT,  in the last 72 hours  Coagulation profile No results found for this basename: INR, PROTIME,  in the last 168 hours  No results found for this basename: DDIMER,  in the last 72 hours  Cardiac Enzymes No results found for this basename: CK, CKMB, TROPONINI, MYOGLOBIN,  in the last 168 hours ------------------------------------------------------------------------------------------------------------------ No components found with this  basename: POCBNP,

## 2012-04-06 NOTE — ED Notes (Addendum)
Pt. Made aware for the need of urine. 

## 2012-04-06 NOTE — Progress Notes (Signed)
  Echocardiogram 2D Echocardiogram has been performed.  Keylon Labelle 04/06/2012, 11:10 AM

## 2012-04-06 NOTE — H&P (Signed)
[facility] History and Physical  Michelle Hurley OZH:[id number] DOB: [birth year] DOA: 04/05/2012  Referring physician:  EDP PCP: No primary provider on file.  Specialists:   Chief Complaint: Nausea, Vomiting, and Diarrhea  HPI: Michelle Hurley is a 62 y.o. female who presented with  Dizziness which preceded Nausea, Vomitiing, and Diarrhea 48 hours ago.  She has had multiple episodes of N+V+D but denies having any hematemesis, melena or hematochezia.  She has not been able to hold down foods or liquids.   She also reports beginning to have ABD discomfort due to the increased vomiting and diarrhea.   She has had fevers, and chills.   She reports having recurring bouts of dizziness, worse when she lays flat.  In the D , her electrolytes were within normal range, and a CT scan was also performed and negative for acute findings, so she was referred for Observation  For Acute Gastroenteritis , and TIA.   Moving to the [facility] for her TIA workup was discussed with the patient, but patient requested to remain at [facility] for her evaluation and treatment.      Review of Systems: The patient denies anorexia, fever, chills, weight loss, vision loss, decreased hearing, hoarseness, chest pain, syncope, dyspnea on exertion, peripheral edema, balance deficits, hemoptysis, abdominal pain, nausea, vomiting, diarrhea, constipation, hematemesis, melena, hematochezia, severe indigestion/heartburn, hematuria, incontinence, suspicious skin lesions, transient blindness, difficulty walking, depression, unusual weight change, abnormal bleeding, enlarged lymph nodes, angioedema, and breast masses.    Past Medical History  Diagnosis Date  . Myopia   . Anxiety state, unspecified   . Morbid obesity   . Other and unspecified hyperlipidemia   . Unspecified essential hypertension   . Osteopenia   . Umbilical hernia   . Palpitations   . Arthritis   . H/O: hysterectomy   . NSVT  (nonsustained ventricular tachycardia)     a. 01/2005 Echo: EF 55-65%     Past Surgical History  Procedure Laterality Date  . Abdominal hysterectomy       Medications:  HOME MEDS: Prior to Admission medications   Medication Sig Start Date End Date Taking? Authorizing Provider  carvedilol (COREG) 25 MG tablet Take 1 tablet (25 mg total) by mouth 2 (two) times daily with a meal. 08/23/11  Yes Ok Anis, NP  furosemide (LASIX) 40 MG tablet Take one in am and if still swollen in the afternoon take a second with potassium 08/23/11  Yes Ok Anis, NP  ibuprofen (ADVIL,MOTRIN) 200 MG tablet Take 200 mg by mouth every 6 (six) hours as needed.     Yes Historical Provider, MD  naproxen sodium (ANAPROX) 220 MG tablet Take 220 mg by mouth 3 (three) times daily with meals.   Yes Historical Provider, MD  spironolactone (ALDACTONE) 25 MG tablet Take 1 tablet (25 mg total) by mouth 2 (two) times daily. 08/23/11  Yes Ok Anis, NP  verapamil (CALAN) 120 MG tablet Take 1/2 tablet by mouth three times a day 08/23/11  Yes Ok Anis, NP     Allergies:  Allergies  Allergen Reactions  . Codeine Nausea And Vomiting  . Prednisone Nausea And Vomiting      Social History:   reports that she has never smoked. She has never used smokeless tobacco. She reports that she does not drink alcohol or use illicit drugs.     Family History: Family History  Problem Relation Age of Onset  . Heart disease Mother   .  Thyroid cancer Sister        HTN in Mother, Sister X 4, and Brother X 1      Lymphoma in Mother      CAD in Both Parents   Physical Exam:  GEN:  Pleasant examined  and in no acute distress; cooperative with exam Filed Vitals:   04/05/12 2310 04/06/12 0030 04/06/12 0031 04/06/12 0032  BP: 168/99 145/90 171/95 171/95  Pulse: 73 78 81 79  Temp: 97.8 F (36.6 C)     TempSrc: Oral     Resp: 20   22  SpO2: 100%   100%   Blood pressure 171/95, pulse 79,  temperature 97.8 F (36.6 C), temperature source Oral, resp. rate 22, last menstrual period 04/05/2012, SpO2 100.00%. PSYCH: She is alert and oriented x4; does not appear anxious does not appear depressed; affect is normal HEENT: Normocephalic and Atraumatic, Mucous membranes pink; PERRLA; EOM intact; Fundi:  Benign;  No scleral icterus, Nares: Patent, Oropharynx: Clear,Sparse Dentition, Neck:  FROM, no cervical lymphadenopathy nor thyromegaly or carotid bruit; no JVD; Breasts:: Not examined CHEST WALL: No tenderness CHEST: Normal respiration, clear to auscultation bilaterally HEART: Regular rate and rhythm; no murmurs rubs or gallops BACK: No kyphosis or scoliosis; no CVA tenderness ABDOMEN: Positive Bowel Sounds, Obese, soft non-tender; Small to moderate  Reducible Supra -Umbilical Hernai no masses, no organomegaly, no pannus; no intertriginous candida. Rectal Exam: Not done EXTREMITIES: No bone or joint deformity; age-appropriate arthropathy of the hands and knees; no cyanosis, clubbing or edema; no ulcerations. Genitalia: not examined PULSES: 2+ and symmetric SKIN: Normal hydration no rash or ulceration CNS: Cranial nerves 2-12 grossly intact no focal neurologic deficit   Labs & Imaging Results for orders placed during the hospital encounter of 04/05/12 (from the past 48 hour(s))  COMPREHENSIVE METABOLIC PANEL     Status: Abnormal   Collection Time    04/06/12 12:30 AM      Result Value Range   Sodium 135  135 - 145 mEq/L   Potassium 4.0  3.5 - 5.1 mEq/L   Chloride 100  96 - 112 mEq/L   CO2 27  19 - 32 mEq/L   Glucose, Bld 97  70 - 99 mg/dL   BUN 20  6 - 23 mg/dL   Creatinine, Ser 9.60  0.50 - 1.10 mg/dL   Calcium 9.8  8.4 - 45.4 mg/dL   Total Protein 8.1  6.0 - 8.3 g/dL   Albumin 3.7  3.5 - 5.2 g/dL   AST 18  0 - 37 U/L   ALT 18  0 - 35 U/L   Alkaline Phosphatase 60  39 - 117 U/L   Total Bilirubin 0.3  0.3 - 1.2 mg/dL   GFR calc non Af Amer 53 (*) >90 mL/min   GFR calc Af  Amer 61 (*) >90 mL/min   Comment:            The eGFR has been calculated     using the CKD EPI equation.     This calculation has not been     validated in all clinical     situations.     eGFR's persistently     <90 mL/min signify     possible Chronic Kidney Disease.  CBC WITH DIFFERENTIAL     Status: None   Collection Time    04/06/12 12:30 AM      Result Value Range   WBC 7.9  4.0 - 10.5 K/uL   RBC  5.08  3.87 - 5.11 MIL/uL   Hemoglobin 13.6  12.0 - 15.0 g/dL   HCT 16.1  09.6 - 04.5 %   MCV 82.3  78.0 - 100.0 fL   MCH 26.8  26.0 - 34.0 pg   MCHC 32.5  30.0 - 36.0 g/dL   RDW 40.9  81.1 - 91.4 %   Platelets 256  150 - 400 K/uL   Neutrophils Relative 73  43 - 77 %   Neutro Abs 5.7  1.7 - 7.7 K/uL   Lymphocytes Relative 21  12 - 46 %   Lymphs Abs 1.6  0.7 - 4.0 K/uL   Monocytes Relative 6  3 - 12 %   Monocytes Absolute 0.5  0.1 - 1.0 K/uL   Eosinophils Relative 0  0 - 5 %   Eosinophils Absolute 0.0  0.0 - 0.7 K/uL   Basophils Relative 0  0 - 1 %   Basophils Absolute 0.0  0.0 - 0.1 K/uL  URINALYSIS, ROUTINE W REFLEX MICROSCOPIC     Status: None   Collection Time    04/06/12  1:40 AM      Result Value Range   Color, Urine YELLOW  YELLOW   APPearance CLEAR  CLEAR   Specific Gravity, Urine 1.024  1.005 - 1.030   pH 7.0  5.0 - 8.0   Glucose, UA NEGATIVE  NEGATIVE mg/dL   Hgb urine dipstick NEGATIVE  NEGATIVE   Bilirubin Urine NEGATIVE  NEGATIVE   Ketones, ur NEGATIVE  NEGATIVE mg/dL   Protein, ur NEGATIVE  NEGATIVE mg/dL   Urobilinogen, UA 1.0  0.0 - 1.0 mg/dL   Nitrite NEGATIVE  NEGATIVE   Leukocytes, UA NEGATIVE  NEGATIVE   Comment: MICROSCOPIC NOT DONE ON URINES WITH NEGATIVE PROTEIN, BLOOD, LEUKOCYTES, NITRITE, OR GLUCOSE <1000 mg/dL.       Cardiac Enzymes: No results found for this basename: CKTOTAL, CKMB, CKMBINDEX, TROPONINI,  in the last 168 hours  BNP (last 3 results) No results found for this basename: PROBNP,  in the last 8760 hours CBG: No results  found for this basename: GLUCAP,  in the last 168 hours  Radiological Exams on Admission: Ct Head Wo Contrast  04/06/2012  *RADIOLOGY REPORT*  Clinical Data: Dizziness and emesis.  CT HEAD WITHOUT CONTRAST  Technique:  Contiguous axial images were obtained from the base of the skull through the vertex without contrast.  Comparison: No priors.  Findings: Well-defined focus of decreased attenuation in the inferior aspect of the left putamen is compatible with an old lacunar infarction.  Some patchy and confluent areas of decreased attenuation throughout the deep and periventricular white matter of the cerebral hemispheres bilaterally compatible with mild chronic microvascular ischemic disease. No acute intracranial abnormalities.  Specifically, no evidence of acute intracranial hemorrhage, no definite findings of acute/subacute cerebral ischemia, no mass, mass effect, hydrocephalus or abnormal intra or extra-axial fluid collections.  Visualized paranasal sinuses and mastoids are well pneumatized.  No acute displaced skull fractures are identified.  IMPRESSION: 1.  No acute intracranial abnormalities. 2.  Mild chronic microvascular ischemic changes in the cerebral white matter and old lacunar infarction in the inferior aspect of the left putamen.   Original Report Authenticated By: Trudie Reed, M.D.      Assessment/Plan Principal Problem:   Acute gastroenteritis Active Problems:   TIA (transient ischemic attack)   HYPERLIPIDEMIA   HYPERTENSION   Dizziness   OBESITY, MORBID   VENTRICULAR TACHYCARDIA     1.   Acute  Gastroenteritis- Observation status, Enteric Precautions, Check Stool for C.diff PCR, and Stool for C+S.   Supportive care with IVFs for hydration, Clear liquid Diet, Anti-Emetics PRN, and Pain Control PRN.     2.   TIA/Dizziness-  TIA workup via protocol.   MRI in AM 03/29, and check Orthostatics PRN.  .     3.    HTN- Hold PO Meds due to Nausea.   IV hydralazine PRN.    4.     NSVT hx-  In remission for years.     5.    Morbid Obesity-  Stable.    6.    Other- DVT prophylaxis with Lovenox.        Code Status:    FULL CODE Family Communication:  Sister at [employer] Disposition Plan:  Return to Home   Time spent:  5 Minutes  Ron Parker [facility] Pager [phone]  If 7PM-7AM, please contact night-coverage [email or web address] Password [facility] 04/06/2012, 5:35 AM

## 2012-04-06 NOTE — ED Notes (Signed)
EKG given to EDP, Bonk, MD. 

## 2012-04-06 NOTE — Progress Notes (Signed)
VASCULAR LAB PRELIMINARY  PRELIMINARY  PRELIMINARY  PRELIMINARY  Carotid Dopplers completed.    Preliminary report:  There is no ICA stenosis.  Vertebral artery flow is antegrade.  Dimitrious Micciche, RVT 04/06/2012, 10:08 AM

## 2012-04-06 NOTE — ED Notes (Signed)
MD, Bonk at bedside.

## 2012-04-06 NOTE — Progress Notes (Signed)
UR completed 

## 2012-04-06 NOTE — ED Notes (Signed)
Attempted to call report.  Nurse giving medications said she would call back

## 2012-04-07 LAB — BASIC METABOLIC PANEL
BUN: 12 mg/dL (ref 6–23)
Calcium: 8.6 mg/dL (ref 8.4–10.5)
Creatinine, Ser: 0.77 mg/dL (ref 0.50–1.10)
GFR calc non Af Amer: 88 mL/min — ABNORMAL LOW (ref >90)
Glucose, Bld: 99 mg/dL (ref 70–99)
Sodium: 137 mEq/L (ref 135–145)

## 2012-04-07 LAB — CBC
MCH: 26.8 pg (ref 26.0–34.0)
MCHC: 32.9 g/dL (ref 30.0–36.0)
Platelets: 217 10*3/uL (ref 150–400)
RBC: 4.59 MIL/uL (ref 3.87–5.11)
RDW: 14.6 % (ref 11.5–15.5)

## 2012-04-07 LAB — LIPID PANEL: Cholesterol: 227 mg/dL — ABNORMAL HIGH (ref 0–200)

## 2012-04-07 LAB — GLUCOSE, CAPILLARY: Glucose-Capillary: 102 mg/dL — ABNORMAL HIGH (ref 70–99)

## 2012-04-07 MED ORDER — MECLIZINE HCL 25 MG PO TABS: 25.0000 mg | ORAL_TABLET | Freq: Three times a day (TID) | ORAL | Status: DC | PRN

## 2012-04-07 MED ORDER — DIAZEPAM 2 MG PO TABS: 2.0000 mg | ORAL_TABLET | Freq: Two times a day (BID) | ORAL | Status: DC | PRN

## 2012-04-07 MED ORDER — INSULIN ASPART 100 UNIT/ML ~~LOC~~ SOLN: 15.0000 [IU] | Freq: Once | SUBCUTANEOUS | Status: DC

## 2012-04-07 MED ORDER — INSULIN GLARGINE 100 UNIT/ML ~~LOC~~ SOLN
15.0000 [IU] | Freq: Every day | SUBCUTANEOUS | Status: DC
  Filled 2012-04-07: qty 0.15

## 2012-04-07 NOTE — Discharge Summary (Signed)
[facility]                                                                                   Michelle Hurley, is a 62 y.o. female  DOB 09-01-1950  MRN [id number].  Admission date:  04/05/2012  Discharge Date:  04/07/2012  Primary MD  No primary provider on file.  Admitting Physician  Ron Parker, MD  Admission Diagnosis  Diarrhea [787.91] Hyperlipemia [272.4] Vertigo [780.4] Hypertension [401.9] Nausea and vomiting [787.01]  Discharge Diagnosis     Principal Problem:   Acute gastroenteritis Active Problems:   HYPERLIPIDEMIA   OBESITY, MORBID   HYPERTENSION   VENTRICULAR TACHYCARDIA   Dizziness   TIA (transient ischemic attack)  Vertigo     Past Medical History  Diagnosis Date  . Myopia   . Anxiety state, unspecified   . Morbid obesity   . Other and unspecified hyperlipidemia   . Unspecified essential hypertension   . Osteopenia   . Umbilical hernia   . Palpitations   . Arthritis   . H/O: hysterectomy   . NSVT (nonsustained ventricular tachycardia)     a. 01/2005 Echo: EF 55-65%    Past Surgical History  Procedure Laterality Date  . Abdominal hysterectomy       Recommendations for primary care physician for things to follow:    outpatient neurology followup recommended for nonspecific MRI MRA findings.   Discharge Diagnoses:   Principal Problem:   Acute gastroenteritis Active Problems:   HYPERLIPIDEMIA   OBESITY, MORBID   HYPERTENSION   VENTRICULAR TACHYCARDIA   Dizziness   TIA (transient ischemic attack)    Discharge Condition: Stable   Diet recommendation: See Discharge Instructions below   Consults PT OT, case management    History of present illness and  Hospital Course:     Kindly see H&P for history of present illness and admission details, please review complete Labs, Consult reports and Test reports for all details in brief Michelle Hurley, is a 62 y.o. female, patient was admitted for vertigo  causing nausea vomiting and spinning sensation, admitted to rule out stroke, MRI MRA no acute findings, echogram and carotid duplex stable, patient not orthostatic, did have mild nystagmus on examination, this appears to be benign positional vertigo, she is better with scheduled Antivert and when necessary Valium, we'll make both medications when necessary and discharge her on the same. Have recommended outpatient neuro followup for nonspecific MRI MRA findings. She is feeling much better, no further episodes of vertigo or nausea vomiting, no diarrhea, she will be seen by PT OT, if needed home PT OT will be ordered, will discharge her with recommendations not to drive to her symptoms have resolved.    Her history of hypertension home medications will be continued, history of obesity she will follow with PCP.      Today   Subjective:   Michelle Hurley today has no headache,no chest abdominal pain,no new weakness tingling or numbness, feels much better wants to go home today.    Objective:   Blood pressure 133/99, pulse 77, temperature 98.6 F (37 C), temperature source Oral, resp. rate 18, height 5\' 2"  (  1.575 m), weight 83.9 kg (184 lb 15.5 oz), last menstrual period 04/05/2012, SpO2 98.00%.   Intake/Output Summary (Last 24 hours) at 04/07/12 1034 Last data filed at 04/07/12 0600  Gross per 24 hour  Intake   2015 ml  Output    775 ml  Net   1240 ml    Exam Awake Alert, Oriented *3, No new F.N deficits, Normal affect [place].AT,PERRAL Supple Neck,No JVD, No cervical lymphadenopathy appriciated.  Symmetrical Chest wall movement, Good air movement bilaterally, CTAB RRR,No Gallops,Rubs or new Murmurs, No Parasternal Heave +ve B.Sounds, Abd Soft, Non tender, No organomegaly appriciated, No rebound -guarding or rigidity. No Cyanosis, Clubbing or edema, No new Rash or bruise  Data Review   Major procedures and Radiology Reports - PLEASE review detailed and final reports for all details in  brief -   Echo  - Left ventricle: The cavity size was normal. There was moderate asymmetric hypertrophy of the septum. No obvious resting LVOT gradient. Systolic function was vigorous. The estimated ejection fraction was in the range of 65% to 70%. Wall motion was normal; there were no regional wall motion abnormalities. - Aortic valve: Trileaflet; mildly calcified leaflets. - Mitral valve: Mild regurgitation. - Left atrium: The atrium was mildly dilated. - Right atrium: Central venous pressure: 5mm Hg (est). - Atrial septum: No defect or patent foramen ovale was identified. - Tricuspid valve: Mild regurgitation. - Pulmonary arteries: Systolic pressure could not be  accurately estimated. - Pericardium, extracardiac: There was no pericardial effusion.      Carotid Dopplers   Preliminary report: There is no ICA stenosis. Vertebral artery flow is antegrade.    Ct Head Wo Contrast  04/06/2012  *RADIOLOGY REPORT*  Clinical Data: Dizziness and emesis.  CT HEAD WITHOUT CONTRAST  Technique:  Contiguous axial images were obtained from the base of the skull through the vertex without contrast.  Comparison: No priors.  Findings: Well-defined focus of decreased attenuation in the inferior aspect of the left putamen is compatible with an old lacunar infarction.  Some patchy and confluent areas of decreased attenuation throughout the deep and periventricular white matter of the cerebral hemispheres bilaterally compatible with mild chronic microvascular ischemic disease. No acute intracranial abnormalities.  Specifically, no evidence of acute intracranial hemorrhage, no definite findings of acute/subacute cerebral ischemia, no mass, mass effect, hydrocephalus or abnormal intra or extra-axial fluid collections.  Visualized paranasal sinuses and mastoids are well pneumatized.  No acute displaced skull fractures are identified.  IMPRESSION: 1.  No acute intracranial abnormalities. 2.  Mild chronic  microvascular ischemic changes in the cerebral white matter and old lacunar infarction in the inferior aspect of the left putamen.   Original Report Authenticated By: Trudie Reed, M.D.    Mri Brain Without Contrast  04/06/2012  *RADIOLOGY REPORT*  Clinical Data:  TIA.  Dizziness.  Generalized weakness.  MRI HEAD WITHOUT CONTRAST MRA HEAD WITHOUT CONTRAST  Technique:  Multiplanar, multiecho pulse sequences of the brain and surrounding structures were obtained without intravenous contrast. Angiographic images of the head were obtained using MRA technique without contrast.  Comparison:  CT head without contrast 04/06/2012.  MRI HEAD  Findings:  The diffusion weighted images demonstrate no evidence for acute or subacute infarction.  Scattered subcortical T2 hyperintensities are present bilaterally.  White matter changes extend into the brainstem.  These are advanced for age.  The flow is present major intracranial arteries.  The ventricles are of normal size.  No significant extra-axial fluid collection is present.  The  globes and orbits are intact.  The paranasal sinuses and mastoid air cells are clear.  IMPRESSION:  1.  No evidence for acute or subacute infarction. 2.  Scattered white matter disease is advanced for age. The finding is nonspecific but can be seen in the setting of chronic microvascular ischemia, a demyelinating process such as multiple sclerosis, vasculitis, complicated migraine headaches, or as the sequelae of a prior infectious or inflammatory process.  MRA HEAD  Findings: The internal carotid arteries are within normal limits from the high cervical segments through the ICA termini bilaterally.  Mild irregularity is present in the right M1 segment. The right A1 segment is normal. The anterior communicating artery is patent.  The proximal left A1 segments is absent.  There is a distal stump, suggesting this is an occluded vessel.  Left M1 segment is normal.  Segmental irregularity is present  within the MCA branch vessels bilaterally.  The left vertebral artery is slightly dominant to the right.  The left PICA origin is visualized and normal.  There is mild irregularity within the basilar artery.  Both posterior cerebral arteries originate from basilar tip.  A moderate proximal stenosis is present on the right.  Segmental narrowing of PCA branch vessels is worse right than left.  IMPRESSION:  1.  Mild to moderate small vessel disease is advanced for age. 2.  Occlusion of the left A1 segment with a small distal stump. 3.  The anterior communicating artery is patent. 4.  Moderate proximal stenosis of the right PCA. 5.  MCA small vessel disease is worse right than left.   Original Report Authenticated By: Marin Roberts, M.D.    Mr Mra Head/brain Wo Cm  04/06/2012  *RADIOLOGY REPORT*  Clinical Data:  TIA.  Dizziness.  Generalized weakness.  MRI HEAD WITHOUT CONTRAST MRA HEAD WITHOUT CONTRAST  Technique:  Multiplanar, multiecho pulse sequences of the brain and surrounding structures were obtained without intravenous contrast. Angiographic images of the head were obtained using MRA technique without contrast.  Comparison:  CT head without contrast 04/06/2012.  MRI HEAD  Findings:  The diffusion weighted images demonstrate no evidence for acute or subacute infarction.  Scattered subcortical T2 hyperintensities are present bilaterally.  White matter changes extend into the brainstem.  These are advanced for age.  The flow is present major intracranial arteries.  The ventricles are of normal size.  No significant extra-axial fluid collection is present.  The globes and orbits are intact.  The paranasal sinuses and mastoid air cells are clear.  IMPRESSION:  1.  No evidence for acute or subacute infarction. 2.  Scattered white matter disease is advanced for age. The finding is nonspecific but can be seen in the setting of chronic microvascular ischemia, a demyelinating process such as multiple sclerosis,  vasculitis, complicated migraine headaches, or as the sequelae of a prior infectious or inflammatory process.  MRA HEAD  Findings: The internal carotid arteries are within normal limits from the high cervical segments through the ICA termini bilaterally.  Mild irregularity is present in the right M1 segment. The right A1 segment is normal. The anterior communicating artery is patent.  The proximal left A1 segments is absent.  There is a distal stump, suggesting this is an occluded vessel.  Left M1 segment is normal.  Segmental irregularity is present within the MCA branch vessels bilaterally.  The left vertebral artery is slightly dominant to the right.  The left PICA origin is visualized and normal.  There is mild irregularity within  the basilar artery.  Both posterior cerebral arteries originate from basilar tip.  A moderate proximal stenosis is present on the right.  Segmental narrowing of PCA branch vessels is worse right than left.  IMPRESSION:  1.  Mild to moderate small vessel disease is advanced for age. 2.  Occlusion of the left A1 segment with a small distal stump. 3.  The anterior communicating artery is patent. 4.  Moderate proximal stenosis of the right PCA. 5.  MCA small vessel disease is worse right than left.   Original Report Authenticated By: Marin Roberts, M.D.     Micro Results      No results found for this or any previous visit (from the past 240 hour(s)).   CBC w Diff: Lab Results  Component Value Date   WBC 5.0 04/07/2012   HGB 12.3 04/07/2012   HCT 37.4 04/07/2012   PLT 217 04/07/2012   LYMPHOPCT 21 04/06/2012   MONOPCT 6 04/06/2012   EOSPCT 0 04/06/2012   BASOPCT 0 04/06/2012    CMP: Lab Results  Component Value Date   NA 137 04/07/2012   K 3.6 04/07/2012   CL 108 04/07/2012   CO2 22 04/07/2012   BUN 12 04/07/2012   CREATININE 0.77 04/07/2012   PROT 8.1 04/06/2012   ALBUMIN 3.7 04/06/2012   BILITOT 0.3 04/06/2012   ALKPHOS 60 04/06/2012   AST 18 04/06/2012   ALT 18  04/06/2012  .   Discharge Instructions     Follow with Primary MD  in 3 days   Get CBC, CMP, checked 3 days by Primary MD and again as instructed by your Primary MD. Get a 2 view    Get Medicines reviewed and adjusted.  Please request your Prim.MD to go over all Hospital Tests and Procedure/Radiological results at the follow up, please get all Hospital records sent to your Prim MD by signing hospital release before you go home.  Activity: As tolerated with Full fall precautions use walker/cane & assistance as needed   Diet:  Heart Healthy  For Heart failure patients - Check your Weight same time everyday, if you gain over 2 pounds, or you develop in leg swelling, experience more shortness of breath or chest pain, call your Primary MD immediately. Follow Cardiac Low Salt Diet and 1.8 lit/day fluid restriction.  Disposition Home    If you experience worsening of your admission symptoms, develop shortness of breath, life threatening emergency, suicidal or homicidal thoughts you must seek medical attention immediately by calling 911 or calling your MD immediately  if symptoms less severe.  You Must read complete instructions/literature along with all the possible adverse reactions/side effects for all the Medicines you take and that have been prescribed to you. Take any new Medicines after you have completely understood and accpet all the possible adverse reactions/side effects.   Do not drive and provide baby sitting services until you have seen by Primary MD or a Neurologist and advised to do so again.  Do not drive when taking Pain medications.    Do not take more than prescribed Pain, Sleep and Anxiety Medications  Special Instructions: If you have smoked or chewed Tobacco  in the last 2 yrs please stop smoking, stop any regular Alcohol  and or any Recreational drug use.  Wear Seat belts while driving.      Follow-up Information   Follow up with Lewayne Bunting, MD. Schedule an  appointment as soon as possible for a visit in 3 days.  Contact information:   [address] [place] [address] [phone]       Follow up with Sandrea Hughs, MD. Schedule an appointment as soon as possible for a visit in 1 week.   Contact information:   [address] [place] [address] [phone]       Follow up with Gates Rigg, MD. Schedule an appointment as soon as possible for a visit in 1 week.   Contact information:   [address] [place] [address] [phone]         Discharge Medications     Medication List    TAKE these medications       carvedilol 25 MG tablet  Commonly known as:  COREG  Take 1 tablet (25 mg total) by mouth 2 (two) times daily with a meal.     diazepam 2 MG tablet  Commonly known as:  VALIUM  Take 1 tablet (2 mg total) by mouth every 12 (twelve) hours as needed for anxiety (dizziness).     furosemide 40 MG tablet  Commonly known as:  LASIX  Take one in am and if still swollen in the afternoon take a second with potassium     ibuprofen 200 MG tablet  Commonly known as:  ADVIL,MOTRIN  Take 200 mg by mouth every 6 (six) hours as needed.     meclizine 25 MG tablet  Commonly known as:  ANTIVERT  Take 1 tablet (25 mg total) by mouth 3 (three) times daily as needed.     naproxen sodium 220 MG tablet  Commonly known as:  ANAPROX  Take 220 mg by mouth 3 (three) times daily with meals.     spironolactone 25 MG tablet  Commonly known as:  ALDACTONE  Take 1 tablet (25 mg total) by mouth 2 (two) times daily.     verapamil 120 MG tablet  Commonly known as:  CALAN  Take 1/2 tablet by mouth three times a day           Total Time in preparing paper work, data evaluation and todays exam - 35 minutes  Leroy Sea M.D on 04/07/2012 at 10:34 AM  [facility] Office  [phone]

## 2012-04-07 NOTE — Evaluation (Signed)
Physical Therapy Evaluation Patient Details Name: Michelle Hurley MRN: [id number] DOB: [birth year] Today's Date: 04/07/2012 Time: 1216-1254 PT Time Calculation (min): 38 min  PT Assessment / Plan / Recommendation Clinical Impression  Pt admitted for nausea, vomiting, diarrhea and vertigo.  Pt seen for vestibular evaluation.  Pt states her symptoms started while sitting on couch.  She denies falling or hitting head.  Pt describes dizziness as spinning sensation.  She reports it usually lasts 1-2 mintues then eases off with turning head or sitting EOB.  Pt positive for symptoms with saccades and VOR.  Nystagmus observed with all position changes.  Left and Right Gilberto Better testing performed both positive for nystagmus with R worse than L and pt reported dizziness/spinning worse on R as well.  Peformed CRT for possible R BPPV.  Pt then ambulated and reports symptoms still present.  Pt likely to d/c home today so recommend f/u with outpatient vestibular PT and pt given referral form.  Pt also educated and given [employer] exercise.  Will see acutely if pt remains in hospital.    PT Assessment  Patient needs continued PT services    Follow Up Recommendations  Outpatient PT    Does the patient have the potential to tolerate intense rehabilitation      Barriers to Discharge        Equipment Recommendations  Rolling walker with 5" wheels    Recommendations for Other Services     Frequency Min 3X/week    Precautions / Restrictions Precautions Precautions: Fall   Pertinent Vitals/Pain n/a      Mobility  Bed Mobility Bed Mobility: Supine to Sit;Sit to Supine Supine to Sit: 6: Modified independent (Device/Increase time) Sit to Supine: 6: Modified independent (Device/Increase time) Transfers Transfers: Sit to Stand;Stand to Sit Sit to Stand: 4: Min guard;With upper extremity assist Stand to Sit: 4: Min guard;With upper extremity assist Details for Transfer Assistance: min/guard  for dizziness Ambulation/Gait Ambulation/Gait Assistance: 4: Min guard Ambulation Distance (Feet): 120 Feet Assistive device: None Ambulation/Gait Assistance Details: robotic type gait pattern until pt cued to relax and try to ambulate normally, no LOB however pt reports increased dizziness, verbal cues to focus on stationary object during ambulation Gait Pattern: Step-through pattern;Wide base of support Gait velocity: decreased    Exercises     PT Diagnosis: Difficulty walking (BPPV)  PT Problem List: Other (comment);Decreased mobility;Decreased balance (BPPV) PT Treatment Interventions: DME instruction;Gait training;Balance training;Functional mobility training;Therapeutic activities;Therapeutic exercise;Other (comment) Francee Piccolo daroff,  CRT)   PT Goals Acute Rehab PT Goals PT Goal Formulation: With patient Time For Goal Achievement: 04/13/12 Potential to Achieve Goals: Good Pt will Ambulate: 51 - 150 feet;with modified independence PT Goal: Ambulate - Progress: Goal set today Additional Goals Additional Goal #1: Pt will report 2/10 dizziness with mobility. PT Goal: Additional Goal #1 - Progress: Goal set today Additional Goal #2: Pt will be independent with vestibular exercises. PT Goal: Additional Goal #2 - Progress: Goal set today  Visit Information  Last PT Received On: 04/07/12 Assistance Needed: +1    Subjective Data  Subjective: I just want it to go away. (vertigo) Patient Stated Goal: no spinning/dizziness   Prior Functioning  Home Living Available Help at Discharge: Family Type of Home: House Home Layout: One level Home Adaptive Equipment: None Additional Comments: Sister in room reports pt will d/c home with another sister who lives without stairs and family will be available to assist pt. Prior Function Level of Independence: Independent Communication Communication: No  difficulties    Cognition  Cognition Overall Cognitive Status: Appears within  functional limits for tasks assessed/performed Arousal/Alertness: Awake/alert Orientation Level: Appears intact for tasks assessed Behavior During Session: [facility] for tasks performed    Extremity/Trunk Assessment Right Upper Extremity Assessment RUE ROM/Strength/Tone: [facility] for tasks assessed Left Upper Extremity Assessment LUE ROM/Strength/Tone: [facility] for tasks assessed Right Lower Extremity Assessment RLE ROM/Strength/Tone: [facility] for tasks assessed Left Lower Extremity Assessment LLE ROM/Strength/Tone: WFL for tasks assessed   Balance    End of Session PT - End of Session Activity Tolerance: Other (comment) (limited by vertigo) Patient left: in bed;with call bell/phone within reach;with family/visitor present  GP Functional Assessment Tool Used: clinical judgement, positive R and L [place]pike, symptomatic with saccades and VOR Functional Limitation: Mobility: Walking and moving around Mobility: Walking and Moving Around Current Status [phone]): At least 1 percent but less than 20 percent impaired, limited or restricted Mobility: Walking and Moving Around Goal Status [phone]): 0 percent impaired, limited or restricted   Cheila Wickstrom,KATHrine E 04/07/2012, 3:13 PM Zenovia Jarred, PT, DPT 04/07/2012 Pager: [phone]

## 2012-04-07 NOTE — Care Management Note (Signed)
    Page 1 of 1   04/07/2012     1:39:34 PM   CARE MANAGEMENT NOTE 04/07/2012  Patient:  Michelle Hurley, Michelle Hurley   Account Number:  [id number]  Date Initiated:  04/06/2012  Documentation initiated by:  Lanier Clam  Subjective/Objective Assessment:   ADMITTED W/N/V/D.     Action/Plan:   FROM HOME   Anticipated DC Date:  04/07/2012   Anticipated DC Plan:  HOME/SELF CARE         Choice offered to / List presented to:             Status of service:  Completed, signed off Medicare Important Message given?   (If response is "NO", the following Medicare IM given date fields will be blank) Date Medicare IM given:   Date Additional Medicare IM given:    Discharge Disposition:  HOME/SELF CARE  Per UR Regulation:  Reviewed for med. necessity/level of care/duration of stay  If discussed at Long Length of Stay Meetings, dates discussed:    Comments:  04/07/12 Tniya Bowditch RN,BSN NCM [phone] NO ORDERS OR D/C NEEDS.

## 2012-04-07 NOTE — Plan of Care (Signed)
Problem: Discharge Progression Outcomes Goal: Other Discharge Outcomes/Goals Outcome: Not Applicable Date Met:  04/07/12 Pt was r/o for cva, has vertigo

## 2012-04-11 ENCOUNTER — Encounter: Payer: Self-pay | Admitting: Internal Medicine

## 2012-04-11 ENCOUNTER — Telehealth: Payer: Self-pay | Admitting: Internal Medicine

## 2012-04-11 ENCOUNTER — Ambulatory Visit (INDEPENDENT_AMBULATORY_CARE_PROVIDER_SITE_OTHER): Payer: Self-pay | Admitting: Internal Medicine

## 2012-04-11 VITALS — BP 142/91 | HR 68 | Ht 62.0 in | Wt 180.2 lb

## 2012-04-11 DIAGNOSIS — I472 Ventricular tachycardia, unspecified: Secondary | ICD-10-CM

## 2012-04-11 DIAGNOSIS — I10 Essential (primary) hypertension: Secondary | ICD-10-CM

## 2012-04-11 NOTE — Telephone Encounter (Signed)
I spoke with pt. D/c paperwork stated to f/u w/ mw W/IN 1 WEEK. Pt scheduled to see him tomorrow 04/12/12 at 3:00. Nothing further was needed

## 2012-04-11 NOTE — Assessment & Plan Note (Signed)
Her blood pressure is slightly elevated. I've encouraged the patient to reduce her salt intake and lose weight. She'll continue her current medical therapy.

## 2012-04-11 NOTE — Progress Notes (Signed)
HPI Michelle Hurley returns today for followup. She is a very pleasant middle-age woman with a history of hypertension, and nonsustained ventricular tachycardia. She is been well-controlled on a combination of beta blocker and calcium channel blocker therapy. In the interim, she denies chest pain or shortness of breath. No syncope. No peripheral edema. Allergies  Allergen Reactions  . Codeine Nausea And Vomiting  . Prednisone Nausea And Vomiting     Current Outpatient Prescriptions  Medication Sig Dispense Refill  . carvedilol (COREG) 25 MG tablet Take 1 tablet (25 mg total) by mouth 2 (two) times daily with a meal.  60 tablet  11  . furosemide (LASIX) 40 MG tablet Take one in am and if still swollen in the afternoon take a second with potassium  60 tablet  11  . ibuprofen (ADVIL,MOTRIN) 200 MG tablet Take 200 mg by mouth every 6 (six) hours as needed.        . meclizine (ANTIVERT) 25 MG tablet Take 1 tablet (25 mg total) by mouth 3 (three) times daily as needed.  30 tablet  1  . naproxen sodium (ANAPROX) 220 MG tablet Take 220 mg by mouth 3 (three) times daily with meals.      Marland Kitchen spironolactone (ALDACTONE) 25 MG tablet Take 1 tablet (25 mg total) by mouth 2 (two) times daily.  60 tablet  11  . verapamil (CALAN) 120 MG tablet Take 1/2 tablet by mouth three times a day  45 tablet  11  . diazepam (VALIUM) 2 MG tablet Take 1 tablet (2 mg total) by mouth every 12 (twelve) hours as needed for anxiety (dizziness).  30 tablet  0   No current facility-administered medications for this visit.     Past Medical History  Diagnosis Date  . Myopia   . Anxiety state, unspecified   . Morbid obesity   . Other and unspecified hyperlipidemia   . Unspecified essential hypertension   . Osteopenia   . Umbilical hernia   . Palpitations   . Arthritis   . H/O: hysterectomy   . NSVT (nonsustained ventricular tachycardia)     a. 01/2005 Echo: EF 55-65%    ROS:   All systems reviewed and negative except as  noted in the HPI.   Past Surgical History  Procedure Laterality Date  . Abdominal hysterectomy       Family History  Problem Relation Age of Onset  . Heart disease Mother   . Thyroid cancer Sister      History   Social History  . Marital Status: Married    Spouse Name: N/A    Number of Children: N/A  . Years of Education: N/A   Occupational History  . Retired    Social History Main Topics  . Smoking status: Never Smoker   . Smokeless tobacco: Never Used  . Alcohol Use: No  . Drug Use: No  . Sexually Active: Not on file   Other Topics Concern  . Not on file   Social History Narrative  . No narrative on file     BP 142/91  Pulse 68  Ht 5\' 2"  (1.575 m)  Wt 180 lb 3.2 oz (81.738 kg)  BMI 32.95 kg/m2  LMP 04/05/2012  Physical Exam:  Well appearing middle-age woman, NAD HEENT: Unremarkable Neck:  6 cm JVD, no thyromegally Lymphatics:  No adenopatrhy HEART:  Regular rate rhythm, no murmurs, no rubs, no clicks Abd:  soft, positive bowel sounds, no organomegally, no rebound, no guarding Ext:  2 plus pulses, trace peripheral edema, no cyanosis, no clubbing Skin:  No rashes no nodules Neuro:  CN II through XII intact, motor grossly intact   Assess/Plan:

## 2012-04-11 NOTE — Patient Instructions (Addendum)
Your physician wants you to follow-up in: 12 months with Dr. Taylor You will receive a reminder letter in the mail two months in advance. If you don't receive a letter, please call our office to schedule the follow-up appointment.  Your physician recommends that you continue on your current medications as directed. Please refer to the Current Medication list given to you today.     

## 2012-04-11 NOTE — Assessment & Plan Note (Signed)
Her ventricular tachycardia is well controlled. She will continue the combination of her beta blocker and calcium channel blocker.

## 2012-04-12 ENCOUNTER — Encounter: Payer: Self-pay | Admitting: Internal Medicine

## 2012-04-12 ENCOUNTER — Ambulatory Visit (INDEPENDENT_AMBULATORY_CARE_PROVIDER_SITE_OTHER): Payer: Self-pay | Admitting: Internal Medicine

## 2012-04-12 ENCOUNTER — Ambulatory Visit: Payer: MEDICAID | Admitting: Neurology

## 2012-04-12 VITALS — BP 126/72 | HR 65 | Temp 98.0°F | Wt 182.0 lb

## 2012-04-12 DIAGNOSIS — I10 Essential (primary) hypertension: Secondary | ICD-10-CM

## 2012-04-12 DIAGNOSIS — K5289 Other specified noninfective gastroenteritis and colitis: Secondary | ICD-10-CM

## 2012-04-12 DIAGNOSIS — K529 Noninfective gastroenteritis and colitis, unspecified: Secondary | ICD-10-CM

## 2012-04-12 DIAGNOSIS — R42 Dizziness and giddiness: Secondary | ICD-10-CM

## 2012-04-12 NOTE — Patient Instructions (Addendum)
Until your appetite and food intake are consistently  normal don't take lasix or aldactone.  Chicken noodle / crackers / broth soups/ jello / gingerale  > last to add would be dairy products, uncooked veggies or salads  Meclizine up to 25 mg 4 x daily as needed - if better then no need to see neurologist.

## 2012-04-12 NOTE — Progress Notes (Signed)
Subjective:    [name], female    DOB: [birth year]    MRN: [id number]  HPI  9 yobf followed here for hypertension and nonsustained ventricular tachycardia for which she is maintained on coreg/verapamil  09/23/07 cpx. LDL 172, rec wt loss   March 15, 2010 ov Pt last seen 11/2008. Pt c/o high BP, feet and ankle swelling x 3 months, no sob.  rec add aldactone 50 qd  04/27/2010 ov/Michelle Hurley  Cc swelling much better no sob rec Cal bal    04/12/2012 Michelle Hurley extended post hosp f/u ov with hx 2 weeks p [facility] cc 3/26 pm dizzy watching TV, woke up still spinning 3/27 with NVD able to eat that day then same symptoms >  Admitted Admission date: 04/05/2012  Discharge Date: 04/07/2012  Primary MD No primary provider on file.  Admitting Physician Ron Parker, MD  Admission Diagnosis Diarrhea [787.91]  Hyperlipemia [272.4]  Vertigo [780.4]  Hypertension [401.9]  Nausea and vomiting [787.01]  Discharge Diagnosis  Principal Problem:  Acute gastroenteritis  Active Problems:  HYPERLIPIDEMIA  OBESITY, MORBID  HYPERTENSION  VENTRICULAR TACHYCARDIA  Dizziness  TIA (transient ischemic attack)  Vertigo   . Hosp record does not reflect any diarrhea though she says she told multiple providers about it.  She had an extensive w/u with echo and ct/ mri's for ? Tia.  At d/c  better still spinning but not continous on meclizine which she doesn't like to take as makes her sleepy.  No h/a, no ataxia or viz complaints.  Also denies any obvious fluctuation of symptoms with weather or environmental changes or other aggravating or alleviating factors.    ROS  The following are not active complaints unless bolded sore throat, dysphagia, dental problems, itching, sneezing,  nasal congestion or excess/ purulent secretions, ear ache,   fever, chills, sweats, unintended wt loss, pleuritic or exertional cp, hemoptysis,  orthopnea pnd or leg swelling, presyncope, palpitations, heartburn, abdominal  pain, anorexia, nausea, vomiting, diarrhea  or change in bowel or urinary habits, change in stools or urine, dysuria,hematuria,  rash, arthralgias, visual complaints, headache, numbness weakness or ataxia or problems with walking or coordination,  change in mood/affect or memory.         Allergies  1) ! Codeine   Past Medical History:  MYOPIA (ICD-367.1)  ANXIETY (ICD-300.00)  PALPITATIONS, HX OF (ICD-V12.50)  OBESITY, MORBID (ICD-278.01)  - Target wt = 158 for BMI < 30  HYPERLIPIDEMIA (ICD-272.4)  - target LDL less than 130 based on positive family history and hypertension  HYPERTENSION (ICD-401.9)  OSTEOPENIA  - DEXA scan 05/13/1998 by [facility] -1.9 left hip -.33 left femur -.4  Health Maintenance............................................................Marland KitchenWert  - Td 01/2005  - neg colonoscopy 9/01 [facility] [place]  - CPX 09/23/07 > declined repeat due to expense              Objective:   Physical Exam wt 182 September 23, 2007    Wt Readings from Last 3 Encounters:  04/12/12 182 lb (82.555 kg)  04/11/12 180 lb 3.2 oz (81.738 kg)  04/06/12 184 lb 15.5 oz (83.9 kg)    in general this is a obese pleasant ambulatory black female in no acute distress quite anxious with histrionic gestures  HEENT: nl dentition, turbinates, and orophanx. Nl external ear canals without cough reflex  Neck without JVD/Nodes/TM  Lungs clear to A and P bilaterally without cough on insp or exp maneuvers  RRR no s3 or murmur or increase in  P2. Trace edema both ankles  Abd soft and benign with nl excursion in the supine position. No bruits or organomegaly  Ext warm without calf tenderness, cyanosis clubbing  Skin warm and dry without lesions  Neuro nl gait, min nystagmus, no cerebellar findings       Assessment & Plan:

## 2012-04-13 ENCOUNTER — Encounter: Payer: Self-pay | Admitting: Internal Medicine

## 2012-04-13 NOTE — Assessment & Plan Note (Signed)
Advised re approp diet and diuretic rx (see HBP)

## 2012-04-13 NOTE — Assessment & Plan Note (Signed)
Occurred in setting of what sounds like a viral gastroenteritis (diarrhea wouldn't be expected with BPV,  Her working dx) and is much better with underuse of meclizine but if continues may need to see Neuro vs ENT next week  For now max rx with meclizine rec

## 2012-04-13 NOTE — Assessment & Plan Note (Signed)
Reviewed rec to hold diuretics when po intake limited for for now Adequate control on present rx, reviewed Dr Lubertha Basque notes with her as well

## 2012-05-09 ENCOUNTER — Telehealth: Payer: Self-pay | Admitting: Neurology

## 2012-05-09 NOTE — Telephone Encounter (Signed)
Returned pt's call, no answer.

## 2012-05-13 NOTE — Telephone Encounter (Signed)
Left patient a message to call back we would sch. Him for apt.

## 2012-05-23 ENCOUNTER — Encounter: Payer: Self-pay | Admitting: Neurology

## 2012-05-23 ENCOUNTER — Ambulatory Visit (INDEPENDENT_AMBULATORY_CARE_PROVIDER_SITE_OTHER): Payer: Self-pay | Admitting: Neurology

## 2012-05-23 VITALS — BP 143/94 | HR 76 | Ht 62.5 in | Wt 181.0 lb

## 2012-05-23 DIAGNOSIS — R42 Dizziness and giddiness: Secondary | ICD-10-CM

## 2012-05-23 NOTE — Progress Notes (Signed)
History:  [name] is a  62 yo RH [place] female, referred by emergency room, and her primary care physician Dr. Sherene Hurley for evaluation of acute onset nausea vomitting and dizziness.  She had a past medical history of hypertension, independently,  In April 03 2012, around 10 PM, while watching TV with her legs up, she noticed sudden onset of dizziness spinning sensation, she went to bed, She woke up next morning at 7 AM, s while answering the phone, she noticed severe vertigo, difficulty focusing, nausea,she has to crawl into the bathroom, vomitting, had watery stool at the same time,  lasting for one hour, gradually improved, she was able to continue her normal activity March 27, and 28th.  But by the evening of 28th, she noticed the onset of nausea vomiting, dizziness, watery diarrhea again, she was taken to the emergency room, was diagnosed with gastroenteritis, there is also concern of posterior circulation stroke,   she had extensive evaluation, MRI of the brain showed no acute lesions, mild small vessel disease, MRA of the brain showed no large vessel disease   ECHO 65-70%. US carotid: normal.  Laboratory showed normal CBC, BMP,   She is overall much better, but still has poor appetite, mildly unsteady gait,  Review of Systems  Out of a complete 14 system review, the patient complains of only the following symptoms, and all other reviewed systems are negative.   Constitutional:   N/A Cardiovascular:  N/A Ear/Nose/Throat:  Spinning sensation Skin: N/A Eyes: N/A Respiratory: N/A Gastroitestinal: N/A    Hematology/Lymphatic:  N/A Endocrine:  N/A Musculoskeletal:N/A Allergy/Immunology: allergy Neurological: dizziness Psychiatric:    N/A  PHYSICAL EXAMINATOINS:  Generalized: In no acute distress  Neck: Supple, no carotid bruits   Cardiac: Regular rate rhythm  Pulmonary: Clear to auscultation bilaterally  Musculoskeletal: No deformity  Neurological  examination  Mentation: Alert oriented to time, place, history taking, and causual conversation  Cranial nerve II-XII: Pupils were equal round reactive to light extraocular movements were full, visual field were full on confrontational test. facial sensation and strength were normal. hearing was intact to finger rubbing bilaterally. Uvula tongue midline.  head turning and shoulder shrug and were normal and symmetric.Tongue protrusion into cheek strength was normal.  Motor: normal tone, bulk and strength.  Sensory: Intact to fine touch, pinprick, preserved vibratory sensation, and proprioception at toes.  Coordination: Normal finger to nose, heel-to-shin bilaterally there was no truncal ataxia  Gait: Rising up from seated position without assistance, normal stance, without trunk ataxia, moderate stride, good arm swing, smooth turning, able to perform tiptoe, and heel walking without difficulty. She has mild difficulty with tandem walking  Romberg signs: Negative  Deep tendon reflexes: Brachioradialis 2/2, biceps 2/2, triceps 2/2, patellar 2/2, Achilles 2/2, plantar responses were flexor bilaterally.  Assessment and plan: 62 years old right-handed Caucasian female, presenting with acute onset of nausea of vomiting, dizziness, extensive evaluation detailed above showed no acute stroke, today's Dix-Hallpike maneuver is negative.  Most consistent with acute gastroenteritis, hope she can continually improve with time, return to clinic as needed.

## 2012-08-15 ENCOUNTER — Ambulatory Visit: Payer: No Typology Code available for payment source | Attending: Family Medicine | Admitting: Family Medicine

## 2012-08-15 ENCOUNTER — Encounter: Payer: Self-pay | Admitting: Family Medicine

## 2012-08-15 ENCOUNTER — Ambulatory Visit: Payer: Self-pay | Attending: Family Medicine

## 2012-08-15 VITALS — BP 135/83 | HR 63 | Temp 98.6°F | Resp 18 | Ht 62.0 in | Wt 186.2 lb

## 2012-08-15 DIAGNOSIS — E785 Hyperlipidemia, unspecified: Secondary | ICD-10-CM

## 2012-08-15 DIAGNOSIS — R42 Dizziness and giddiness: Secondary | ICD-10-CM

## 2012-08-15 DIAGNOSIS — I10 Essential (primary) hypertension: Secondary | ICD-10-CM | POA: Insufficient documentation

## 2012-08-15 LAB — POCT GLYCOSYLATED HEMOGLOBIN (HGB A1C): Hemoglobin A1C: 5.7

## 2012-08-15 LAB — CBC
HCT: 39.4 % (ref 36.0–46.0)
Hemoglobin: 13.1 g/dL (ref 12.0–15.0)
RBC: 4.9 MIL/uL (ref 3.87–5.11)

## 2012-08-15 MED ORDER — LOVASTATIN 20 MG PO TABS: 20.0000 mg | ORAL_TABLET | Freq: Every day | ORAL | Status: DC

## 2012-08-15 NOTE — Progress Notes (Signed)
[name], female   DOB: 1950-09-07, 62 y.o.   MRN: [id number]  CC:  EStablish   HPI: Pt is presenting as a new patient today.   Pt reports a history of hypertension on medications.  Pt says that she has a long history of dizziness and has seen a neurologist as recently as several months ago.    Allergies  Allergen Reactions  . Codeine Nausea And Vomiting  . Prednisone Nausea And Vomiting   Past Medical History  Diagnosis Date  . Myopia   . Anxiety state, unspecified   . Morbid obesity   . Other and unspecified hyperlipidemia   . Unspecified essential hypertension   . Osteopenia   . Umbilical hernia   . Palpitations   . Arthritis   . H/O: hysterectomy   . NSVT (nonsustained ventricular tachycardia)     a. 01/2005 Echo: EF 55-65%   Current Outpatient Prescriptions on File Prior to Visit  Medication Sig Dispense Refill  . carvedilol (COREG) 25 MG tablet Take 1 tablet (25 mg total) by mouth 2 (two) times daily with a meal.  60 tablet  11  . furosemide (LASIX) 40 MG tablet Take one in am and if still swollen in the afternoon take a second with potassium  60 tablet  11  . ibuprofen (ADVIL,MOTRIN) 200 MG tablet Take 200 mg by mouth every 6 (six) hours as needed.        Marland Kitchen spironolactone (ALDACTONE) 25 MG tablet Take 1 tablet (25 mg total) by mouth 2 (two) times daily.  60 tablet  11  . verapamil (CALAN) 120 MG tablet Take 1/2 tablet by mouth three times a day  45 tablet  11   No current facility-administered medications on file prior to visit.   Family History  Problem Relation Age of Onset  . Heart disease Mother   . Diabetes Mother   . Thyroid cancer Sister   . Diabetes Sister   . Hypertension Brother    History   Social History  . Marital Status: Married    Spouse Name: N/A    Number of Children: N/A  . Years of Education: N/A   Occupational History  . Retired    Social History Main Topics  . Smoking status: Never Smoker   . Smokeless tobacco:  Never Used  . Alcohol Use: No  . Drug Use: No  . Sexually Active: Not on file   Other Topics Concern  . Not on file   Social History Narrative  . No narrative on file    Review of Systems  Constitutional: Negative for fever, chills, diaphoresis, activity change, appetite change and fatigue.  HENT: Negative for ear pain, nosebleeds, congestion, facial swelling, rhinorrhea, neck pain, neck stiffness and ear discharge.   Eyes: Negative for pain, discharge, redness, itching and visual disturbance.  Respiratory: Negative for cough, choking, chest tightness, shortness of breath, wheezing and stridor.   Cardiovascular: Negative for chest pain, palpitations and leg swelling.  Gastrointestinal: Negative for abdominal distention.  Genitourinary: Negative for dysuria, urgency, frequency, hematuria, flank pain, decreased urine volume, difficulty urinating and dyspareunia.  Musculoskeletal: Negative for back pain, joint swelling, arthralgias and gait problem.  Neurological: Negative for dizziness, tremors, seizures, syncope, facial asymmetry, speech difficulty, weakness, light-headedness, numbness and headaches.  Hematological: Negative for adenopathy. Does not bruise/bleed easily.  Psychiatric/Behavioral: Negative for hallucinations, behavioral problems, confusion, dysphoric mood, decreased concentration and agitation.    Objective:   Filed Vitals:   08/15/12 1211  BP: 135/83  Pulse: 63  Temp: 98.6 F (37 C)  Resp: 18    Physical Exam  Constitutional: Appears well-developed and well-nourished. No distress.  HENT: Normocephalic. External right and left ear normal. Oropharynx is clear and moist.  Eyes: Conjunctivae and EOM are normal. PERRLA, no scleral icterus.  Neck: Normal ROM. Neck supple. No JVD. No tracheal deviation. No thyromegaly.  CVS: RRR, S1/S2 +, no murmurs, no gallops, no carotid bruit.  Pulmonary: Effort and breath sounds normal, no stridor, rhonchi, wheezes, rales.   Abdominal: Soft. BS +,  no distension, tenderness, rebound or guarding.  Musculoskeletal: Normal range of motion. No edema and no tenderness.  Lymphadenopathy: No lymphadenopathy noted, cervical, inguinal. Neuro: Alert. Normal reflexes, muscle tone coordination. No cranial nerve deficit. Skin: Skin is warm and dry. No rash noted. Not diaphoretic. No erythema. No pallor.  Psychiatric: Normal mood and affect. Behavior, judgment, thought content normal.   Lab Results  Component Value Date   WBC 5.0 04/07/2012   HGB 12.3 04/07/2012   HCT 37.4 04/07/2012   MCV 81.5 04/07/2012   PLT 217 04/07/2012   Lab Results  Component Value Date   CREATININE 0.77 04/07/2012   BUN 12 04/07/2012   NA 137 04/07/2012   K 3.6 04/07/2012   CL 108 04/07/2012   CO2 22 04/07/2012    Lab Results  Component Value Date   HGBA1C 6.4* 04/06/2012   Lipid Panel     Component Value Date/Time   CHOL 227* 04/07/2012 0500   TRIG 150* 04/07/2012 0500   HDL 32* 04/07/2012 0500   CHOLHDL 7.1 04/07/2012 0500   VLDL 30 04/07/2012 0500   LDLCALC 165* 04/07/2012 0500       Assessment and plan:   Patient Active Problem List   Diagnosis Date Noted  . Dyslipidemia 08/15/2012  . Accelerated hypertension 08/15/2012  . Acute gastroenteritis 04/06/2012  . Dizziness 04/06/2012  . TIA (transient ischemic attack) 04/06/2012  . VENTRICULAR TACHYCARDIA 12/04/2008  . ARTHRALGIA UNSPECIFIED SITE 12/04/2008  . HYPERLIPIDEMIA 12/24/2006  . OBESITY, MORBID 12/24/2006  . HYPERTENSION 12/24/2006  . PALPITATIONS, HX OF 12/24/2006   Dyslipidemia - Plan: HgB A1c, CBC, COMPLETE METABOLIC PANEL WITH GFR  hypertension - Plan: HgB A1c, CBC, COMPLETE METABOLIC PANEL WITH GFR  HYPERTENSION  HYPERLIPIDEMIA  Continue current cardiac medications  Dizziness currently stable  Start lovastatin 20 mg po daily for dyslipidemia  RTC in 2 months for CPE  The patient was given clear instructions to go to ER or return to medical center if  symptoms don't improve, worsen or new problems develop.  The patient verbalized understanding.  The patient was told to call to get any lab results if not heard anything in the next week.    Rodney Langton, MD, CDE, FAAFP [facility] [place], [place]

## 2012-08-15 NOTE — Progress Notes (Signed)
PT HERE TO ESTABLISH NEW CARE HX HTN TAKING PRESCRIBED MEDS HAS APPT WITH DIANE FOR ORANGE CARD

## 2012-08-15 NOTE — Patient Instructions (Addendum)

## 2012-08-16 ENCOUNTER — Telehealth: Payer: Self-pay | Admitting: Emergency Medicine

## 2012-08-16 LAB — COMPLETE METABOLIC PANEL WITH GFR
Albumin: 4.1 g/dL (ref 3.5–5.2)
CO2: 27 mEq/L (ref 19–32)
Calcium: 9.4 mg/dL (ref 8.4–10.5)
GFR, Est African American: 54 mL/min — ABNORMAL LOW
GFR, Est Non African American: 47 mL/min — ABNORMAL LOW
Glucose, Bld: 85 mg/dL (ref 70–99)
Potassium: 4.4 mEq/L (ref 3.5–5.3)
Sodium: 139 mEq/L (ref 135–145)
Total Protein: 7.1 g/dL (ref 6.0–8.3)

## 2012-08-16 NOTE — Progress Notes (Signed)
Quick Note:  Please inform patient that her labs reveal that cholesterol levels are really high. Please take cholesterol meds and follow up in 1-2 weeks to discuss stronger cholesterol treatments. Also, labs reveal that pt may be dehydrated and has some acute renal insufficiency. Please ask patient to hold off taking the lasix for 5 days, drink extra fluids, recheck BMP in 2 weeks.   Rodney Langton, MD, CDE, FAAFP [facility] [place], [place]   ______

## 2012-08-16 NOTE — Progress Notes (Signed)
Left a voice message with pt to call back.

## 2012-08-16 NOTE — Telephone Encounter (Signed)
Left a message on answering machine to call back for lab results.

## 2012-08-31 ENCOUNTER — Other Ambulatory Visit: Payer: Self-pay | Admitting: Nurse Practitioner

## 2012-08-31 ENCOUNTER — Other Ambulatory Visit: Payer: Self-pay | Admitting: Cardiovascular Disease

## 2012-08-31 ENCOUNTER — Other Ambulatory Visit: Payer: Self-pay | Admitting: Internal Medicine

## 2012-09-02 ENCOUNTER — Other Ambulatory Visit: Payer: Self-pay | Admitting: Emergency Medicine

## 2012-09-02 ENCOUNTER — Telehealth: Payer: Self-pay | Admitting: Family Medicine

## 2012-09-02 DIAGNOSIS — I472 Ventricular tachycardia: Secondary | ICD-10-CM

## 2012-09-02 MED ORDER — VERAPAMIL HCL 120 MG PO TABS: ORAL_TABLET | ORAL | Status: DC

## 2012-09-02 NOTE — Telephone Encounter (Signed)
PT CAME IN PERSON TO DISCUSS RX VERAPAMIL AND CARVEDILOL REFILL. MEDS WERE EXPIRED FROM PAST PROVIDER. DR. Florene Glen 'D REFILL

## 2012-09-02 NOTE — Telephone Encounter (Signed)
Pt is out of her prescriptions, can you please see if you can fill the script for her. The patient needs her medicaitons. ~CC

## 2012-09-28 ENCOUNTER — Other Ambulatory Visit: Payer: Self-pay | Admitting: Nurse Practitioner

## 2012-09-28 ENCOUNTER — Other Ambulatory Visit: Payer: Self-pay | Admitting: Cardiovascular Disease

## 2012-10-15 ENCOUNTER — Encounter: Payer: Self-pay | Admitting: Internal Medicine

## 2012-10-15 ENCOUNTER — Telehealth: Payer: Self-pay | Admitting: Internal Medicine

## 2012-10-15 ENCOUNTER — Ambulatory Visit: Payer: No Typology Code available for payment source | Attending: Internal Medicine | Admitting: Internal Medicine

## 2012-10-15 VITALS — BP 127/80 | HR 65 | Temp 98.0°F | Resp 17 | Ht 62.0 in | Wt 182.6 lb

## 2012-10-15 DIAGNOSIS — E785 Hyperlipidemia, unspecified: Secondary | ICD-10-CM

## 2012-10-15 DIAGNOSIS — Z0000 Encounter for general adult medical examination without abnormal findings: Secondary | ICD-10-CM

## 2012-10-15 DIAGNOSIS — I472 Ventricular tachycardia: Secondary | ICD-10-CM

## 2012-10-15 NOTE — Progress Notes (Signed)
Patient here for 2 month follow up for her cholesterol

## 2012-10-15 NOTE — Progress Notes (Signed)
[name], female   DOB: Dec 12, 1950, 62 y.o.   MRN: [id number] Patient Demographics  Michelle Hurley, is a 62 y.o. female  [id number]  [id number]  DOB - [birth year]  Chief Complaint  Patient presents with  . Follow-up        Subjective:   Michelle Hurley is a 62 y.o. female here today for a follow up visit. She was recently started statin for dyslipidemia and told to come back for repeat lipid panel. She has no complaint today Patient has No headache, No chest pain, No abdominal pain - No Nausea, No new weakness tingling or numbness, No Cough - SOB.  ALLERGIES: Allergies  Allergen Reactions  . Codeine Nausea And Vomiting  . Prednisone Nausea And Vomiting    PAST MEDICAL HISTORY: Past Medical History  Diagnosis Date  . Myopia   . Anxiety state, unspecified   . Morbid obesity   . Other and unspecified hyperlipidemia   . Unspecified essential hypertension   . Osteopenia   . Umbilical hernia   . Palpitations   . Arthritis   . H/O: hysterectomy   . NSVT (nonsustained ventricular tachycardia)     a. 01/2005 Echo: EF 55-65%    MEDICATIONS AT HOME: Prior to Admission medications   Medication Sig Start Date End Date Taking? Authorizing Provider  carvedilol (COREG) 25 MG tablet TAKE ONE TABLET BY MOUTH TWICE DAILY WITH MEALS 08/31/12   Marinus Maw, MD  furosemide (LASIX) 40 MG tablet TAKE ONE TABLET BY MOUTH EVERY DAY IN THE MORNING AND  IF  STILL  SWOLLEN  IN  THE AFTERNOON  TAKE  A  SECOND  TABLET  WITH  POTASSIUM 09/28/12   Marinus Maw, MD  ibuprofen (ADVIL,MOTRIN) 200 MG tablet Take 200 mg by mouth every 6 (six) hours as needed.      Historical Provider, MD  lovastatin (MEVACOR) 20 MG tablet Take 1 tablet (20 mg total) by mouth at bedtime. 08/15/12   Clanford Cyndie Mull, MD  spironolactone (ALDACTONE) 25 MG tablet TAKE ONE TABLET BY MOUTH TWICE DAILY 09/28/12   Tonny Bollman, MD  verapamil (CALAN) 120 MG tablet Take 1/2 tablet by mouth three  times a day 09/02/12   Jeanann Lewandowsky, MD     Objective:   Filed Vitals:   10/15/12 1138  BP: 127/80  Pulse: 65  Temp: 98 F (36.7 C)  Resp: 17  Height: 5\' 2"  (1.575 m)  Weight: 182 lb 9.6 oz (82.827 kg)  SpO2: 100%    Exam General appearance : Awake, alert, not in any distress. Speech Clear. Not toxic looking HEENT: Atraumatic and Normocephalic, pupils equally reactive to light and accomodation Neck: supple, no JVD. No cervical lymphadenopathy.  Chest:Good air entry bilaterally, no added sounds  CVS: S1 S2 regular, no murmurs.  Abdomen: Bowel sounds present, Non tender and not distended with no gaurding, rigidity or rebound. Extremities: B/L Lower Ext shows no edema, both legs are warm to touch Neurology: Awake alert, and oriented X 3, CN II-XII intact, Non focal Skin:No Rash Wounds:N/A   Data Review   CBC No results found for this basename: WBC, HGB, HCT, PLT, MCV, MCH, MCHC, RDW, NEUTRABS, LYMPHSABS, MONOABS, EOSABS, BASOSABS, BANDABS, BANDSABD,  in the last 168 hours  Chemistries   No results found for this basename: NA, K, CL, CO2, GLUCOSE, BUN, CREATININE, GFRCGP, CALCIUM, MG, AST, ALT, ALKPHOS, BILITOT,  in the last 168 hours ------------------------------------------------------------------------------------------------------------------ No results found for this basename: HGBA1C,  in  the last 72 hours ------------------------------------------------------------------------------------------------------------------ No results found for this basename: CHOL, HDL, LDLCALC, TRIG, CHOLHDL, LDLDIRECT,  in the last 72 hours ------------------------------------------------------------------------------------------------------------------ No results found for this basename: TSH, T4TOTAL, FREET3, T3FREE, THYROIDAB,  in the last 72 hours ------------------------------------------------------------------------------------------------------------------ No results found for  this basename: VITAMINB12, FOLATE, FERRITIN, TIBC, IRON, RETICCTPCT,  in the last 72 hours  Coagulation profile  No results found for this basename: INR, PROTIME,  in the last 168 hours    Assessment & Plan   Patient Active Problem List   Diagnosis Date Noted  . Dyslipidemia (high LDL; low HDL) 10/15/2012  . Dyslipidemia 08/15/2012  . Accelerated hypertension 08/15/2012  . Acute gastroenteritis 04/06/2012  . Dizziness 04/06/2012  . TIA (transient ischemic attack) 04/06/2012  . VENTRICULAR TACHYCARDIA 12/04/2008  . ARTHRALGIA UNSPECIFIED SITE 12/04/2008  . HYPERLIPIDEMIA 12/24/2006  . OBESITY, MORBID 12/24/2006  . HYPERTENSION 12/24/2006  . PALPITATIONS, HX OF 12/24/2006     Plan: Patient was counseled extensively about blood pressure control No need for labs today because the last one was done only 2 months ago, she only started taking statin 2 months ago Patient was counseled about nutrition and exercise   Health Maintenance -Colonoscopy: Referral made -Pap Smear: Patient had  total abdominal hysterectomy -Mammogram: Referral made  -Vaccinations:  -Influenza given today  Follow up in one months for lipid panel or when necessary   The patient was given clear instructions to go to ER or return to medical center if symptoms don't improve, worsen or new problems develop. The patient verbalized understanding. The patient was told to call to get lab results if they haven't heard anything in the next week.    Jeanann Lewandowsky, MD, MHA, FACP, FAAP [facility] [place], [place] [phone]   10/15/2012, 12:25 PM

## 2012-10-15 NOTE — Telephone Encounter (Signed)
Error.Michelle Hurley ° °

## 2012-10-15 NOTE — Patient Instructions (Signed)

## 2012-11-08 ENCOUNTER — Other Ambulatory Visit: Payer: Self-pay | Admitting: Cardiovascular Disease

## 2012-11-12 ENCOUNTER — Other Ambulatory Visit: Payer: No Typology Code available for payment source

## 2012-11-27 ENCOUNTER — Other Ambulatory Visit: Payer: Self-pay | Admitting: Internal Medicine

## 2012-12-13 ENCOUNTER — Other Ambulatory Visit: Payer: Self-pay | Admitting: Emergency Medicine

## 2012-12-13 MED ORDER — LOVASTATIN 20 MG PO TABS: 20.0000 mg | ORAL_TABLET | Freq: Every day | ORAL | Status: DC

## 2012-12-31 ENCOUNTER — Ambulatory Visit: Payer: No Typology Code available for payment source | Admitting: Internal Medicine

## 2013-01-16 ENCOUNTER — Other Ambulatory Visit: Payer: Self-pay | Admitting: Internal Medicine

## 2013-01-22 ENCOUNTER — Other Ambulatory Visit: Payer: Self-pay | Admitting: Internal Medicine

## 2013-02-26 ENCOUNTER — Telehealth: Payer: Self-pay | Admitting: Internal Medicine

## 2013-02-26 ENCOUNTER — Telehealth: Payer: Self-pay

## 2013-02-26 NOTE — Telephone Encounter (Signed)
Patient is in [place], and she would like her medication filled.

## 2013-02-26 NOTE — Telephone Encounter (Signed)
Patient called  In need of a refill Visiting daughter in [place] and her medications were getting low Was not anticipating being there as long as she is Refilled her coreg and lovastatin Spoke with pharmacy in [place] at [facility]

## 2013-03-10 ENCOUNTER — Ambulatory Visit: Payer: Self-pay

## 2013-03-11 ENCOUNTER — Telehealth: Payer: Self-pay | Admitting: Internal Medicine

## 2013-03-11 NOTE — Telephone Encounter (Signed)
Pt called regarding a refill of her medication, please contact pt

## 2013-03-12 ENCOUNTER — Other Ambulatory Visit: Payer: Self-pay

## 2013-03-12 MED ORDER — VERAPAMIL HCL 120 MG PO TABS: ORAL_TABLET | ORAL | Status: DC

## 2013-04-02 ENCOUNTER — Telehealth: Payer: Self-pay | Admitting: Internal Medicine

## 2013-04-02 NOTE — Telephone Encounter (Signed)
Pt called regarding a refill of her medication. Pt is in [place] as of right now and would like her medication sent to the [facility] in [place] the pharmacy number is [phone]. Please contact pt as soon as possible

## 2013-04-04 ENCOUNTER — Other Ambulatory Visit: Payer: Self-pay | Admitting: Internal Medicine

## 2013-04-07 ENCOUNTER — Telehealth: Payer: Self-pay | Admitting: *Deleted

## 2013-04-07 ENCOUNTER — Other Ambulatory Visit: Payer: Self-pay | Admitting: *Deleted

## 2013-04-07 DIAGNOSIS — E7800 Pure hypercholesterolemia, unspecified: Secondary | ICD-10-CM

## 2013-04-07 MED ORDER — CARVEDILOL 25 MG PO TABS: 25.0000 mg | ORAL_TABLET | Freq: Two times a day (BID) | ORAL | Status: DC

## 2013-04-07 MED ORDER — LOVASTATIN 20 MG PO TABS
20.0000 mg | ORAL_TABLET | Freq: Every day | ORAL | Status: DC
Start: 2013-04-07 — End: 2013-04-11

## 2013-04-07 NOTE — Telephone Encounter (Signed)
Pt called needing a refill on 2 of her medications. I called in the 2 rx and made sure she had an appointment next month.

## 2013-04-11 ENCOUNTER — Other Ambulatory Visit: Payer: Self-pay | Admitting: Emergency Medicine

## 2013-04-11 DIAGNOSIS — E7800 Pure hypercholesterolemia, unspecified: Secondary | ICD-10-CM

## 2013-04-11 MED ORDER — LOVASTATIN 20 MG PO TABS: 20.0000 mg | ORAL_TABLET | Freq: Every day | ORAL | Status: DC

## 2013-04-24 ENCOUNTER — Ambulatory Visit: Payer: Self-pay | Attending: Internal Medicine | Admitting: Internal Medicine

## 2013-04-24 ENCOUNTER — Ambulatory Visit: Payer: No Typology Code available for payment source | Attending: Internal Medicine

## 2013-04-24 ENCOUNTER — Encounter: Payer: Self-pay | Admitting: Internal Medicine

## 2013-04-24 VITALS — BP 119/80 | HR 74 | Temp 98.6°F | Resp 16 | Ht 62.5 in | Wt 185.0 lb

## 2013-04-24 DIAGNOSIS — E7800 Pure hypercholesterolemia, unspecified: Secondary | ICD-10-CM | POA: Insufficient documentation

## 2013-04-24 DIAGNOSIS — I10 Essential (primary) hypertension: Secondary | ICD-10-CM | POA: Insufficient documentation

## 2013-04-24 DIAGNOSIS — I4729 Other ventricular tachycardia: Secondary | ICD-10-CM | POA: Insufficient documentation

## 2013-04-24 DIAGNOSIS — F411 Generalized anxiety disorder: Secondary | ICD-10-CM | POA: Insufficient documentation

## 2013-04-24 DIAGNOSIS — I472 Ventricular tachycardia, unspecified: Secondary | ICD-10-CM | POA: Insufficient documentation

## 2013-04-24 DIAGNOSIS — R002 Palpitations: Secondary | ICD-10-CM | POA: Insufficient documentation

## 2013-04-24 DIAGNOSIS — E785 Hyperlipidemia, unspecified: Secondary | ICD-10-CM | POA: Insufficient documentation

## 2013-04-24 DIAGNOSIS — M949 Disorder of cartilage, unspecified: Secondary | ICD-10-CM

## 2013-04-24 DIAGNOSIS — M899 Disorder of bone, unspecified: Secondary | ICD-10-CM | POA: Insufficient documentation

## 2013-04-24 LAB — COMPLETE METABOLIC PANEL WITH GFR
ALBUMIN: 4.1 g/dL (ref 3.5–5.2)
ALT: 20 U/L (ref 0–35)
AST: 18 U/L (ref 0–37)
Alkaline Phosphatase: 59 U/L (ref 39–117)
BILIRUBIN TOTAL: 0.3 mg/dL (ref 0.2–1.2)
BUN: 16 mg/dL (ref 6–23)
CO2: 24 meq/L (ref 19–32)
Calcium: 9.8 mg/dL (ref 8.4–10.5)
Chloride: 105 mEq/L (ref 96–112)
Creat: 1.2 mg/dL — ABNORMAL HIGH (ref 0.50–1.10)
GFR, EST AFRICAN AMERICAN: 55 mL/min — AB
GFR, EST NON AFRICAN AMERICAN: 48 mL/min — AB
GLUCOSE: 110 mg/dL — AB (ref 70–99)
POTASSIUM: 4.2 meq/L (ref 3.5–5.3)
SODIUM: 138 meq/L (ref 135–145)
TOTAL PROTEIN: 7.5 g/dL (ref 6.0–8.3)

## 2013-04-24 LAB — LIPID PANEL
Cholesterol: 185 mg/dL (ref 0–200)
HDL: 38 mg/dL — ABNORMAL LOW (ref >39)
LDL CALC: 115 mg/dL — AB (ref 0–99)
Total CHOL/HDL Ratio: 4.9 Ratio
Triglycerides: 162 mg/dL — ABNORMAL HIGH (ref <150)
VLDL: 32 mg/dL (ref 0–40)

## 2013-04-24 LAB — POCT GLYCOSYLATED HEMOGLOBIN (HGB A1C): Hemoglobin A1C: 5.9

## 2013-04-24 MED ORDER — VERAPAMIL HCL 120 MG PO TABS: ORAL_TABLET | ORAL | Status: DC

## 2013-04-24 MED ORDER — CARVEDILOL 25 MG PO TABS: 25.0000 mg | ORAL_TABLET | Freq: Two times a day (BID) | ORAL | Status: DC

## 2013-04-24 MED ORDER — FUROSEMIDE 40 MG PO TABS: 40.0000 mg | ORAL_TABLET | Freq: Every day | ORAL | Status: DC

## 2013-04-24 MED ORDER — SPIRONOLACTONE 25 MG PO TABS: 25.0000 mg | ORAL_TABLET | Freq: Every day | ORAL | Status: DC

## 2013-04-24 MED ORDER — LOVASTATIN 20 MG PO TABS: 20.0000 mg | ORAL_TABLET | Freq: Every day | ORAL | Status: DC

## 2013-04-24 NOTE — Patient Instructions (Signed)

## 2013-04-24 NOTE — Progress Notes (Signed)
Pt is here needing her medication refilled. Pt is due to have her cholesterol.

## 2013-04-24 NOTE — Progress Notes (Signed)
[name], female   DOB: 1951-01-07, 63 y.o.   MRN: [id number]   Michelle Hurley, is a 63 y.o. female  [id number]  [id number]  DOB - 1950-07-30  Chief Complaint  Patient presents with  . Follow-up        Subjective:   Michelle Hurley is a 63 y.o. female here today for a follow up visit. Patient has history of dyslipidemia, hypertension, arthritis, occasional palpitation. She is on medications as listed below, she is compliant with her medications. She was started on lovastatin for high LDL 6 months ago she is here today for a repeat of lipid panel. She has no complaint today. She does not smoke cigarette, she does not drink alcohol. She needs refill on all her medications. Patient has No headache, No chest pain, No abdominal pain - No Nausea, No new weakness tingling or numbness, No Cough - SOB.  Problem  High Cholesterol    ALLERGIES: Allergies  Allergen Reactions  . Codeine Nausea And Vomiting  . Prednisone Nausea And Vomiting    PAST MEDICAL HISTORY: Past Medical History  Diagnosis Date  . Myopia   . Anxiety state, unspecified   . Morbid obesity   . Other and unspecified hyperlipidemia   . Unspecified essential hypertension   . Osteopenia   . Umbilical hernia   . Palpitations   . Arthritis   . H/O: hysterectomy   . NSVT (nonsustained ventricular tachycardia)     a. 01/2005 Echo: EF 55-65%    MEDICATIONS AT HOME: Prior to Admission medications   Medication Sig Start Date End Date Taking? Authorizing Provider  carvedilol (COREG) 25 MG tablet Take 1 tablet (25 mg total) by mouth 2 (two) times daily with a meal. 04/24/13  Yes Angelica Chessman, MD  furosemide (LASIX) 40 MG tablet Take 1 tablet (40 mg total) by mouth daily. 04/24/13  Yes Angelica Chessman, MD  ibuprofen (ADVIL,MOTRIN) 200 MG tablet Take 200 mg by mouth every 6 (six) hours as needed.     Yes Historical Provider, MD  lovastatin (MEVACOR) 20 MG tablet Take 1 tablet (20 mg  total) by mouth at bedtime. 04/24/13  Yes Angelica Chessman, MD  spironolactone (ALDACTONE) 25 MG tablet Take 1 tablet (25 mg total) by mouth daily. 04/24/13  Yes Angelica Chessman, MD  verapamil (CALAN) 120 MG tablet TAKE ONE-HALF TABLET BY MOUTH THREE TIMES DAILY 04/24/13  Yes Angelica Chessman, MD     Objective:   Filed Vitals:   04/24/13 0923  BP: 119/80  Pulse: 74  Temp: 98.6 F (37 C)  TempSrc: Oral  Resp: 16  Height: 5' 2.5" (1.588 m)  Weight: 185 lb (83.915 kg)  SpO2: 92%    Exam General appearance : Awake, alert, not in any distress. Speech Clear. Not toxic looking HEENT: Atraumatic and Normocephalic, pupils equally reactive to light and accomodation Neck: supple, no JVD. No cervical lymphadenopathy.  Chest:Good air entry bilaterally, no added sounds  CVS: S1 S2 regular, no murmurs.  Abdomen: Bowel sounds present, Non tender and not distended with no gaurding, rigidity or rebound. Extremities: B/L Lower Ext shows no edema, both legs are warm to touch Neurology: Awake alert, and oriented X 3, CN II-XII intact, Non focal Skin:No Rash Wounds:N/A  Data Review Lab Results  Component Value Date   HGBA1C 5.9 04/24/2013   HGBA1C 5.7 08/15/2012   HGBA1C 6.4* 04/06/2012      Assessment & Plan  - POCT glycosylated hemoglobin (Hb A1C) Her hemoglobin A1c today was  5.9%, patient has been counseled about being prediabetic advised on strict adherence to low carbohydrate diet and regular physical exercise  1. High cholesterol  - Lipid Panel  Refill - lovastatin (MEVACOR) 20 MG tablet; Take 1 tablet (20 mg total) by mouth at bedtime.  Dispense: 90 tablet; Refill: 3  2. Paroxysmal ventricular tachycardia Refill - verapamil (CALAN) 120 MG tablet; TAKE ONE-HALF TABLET BY MOUTH THREE TIMES DAILY  Dispense: 90 tablet; Refill: 3  3. Unspecified essential hypertension Refill - spironolactone (ALDACTONE) 25 MG tablet; Take 1 tablet (25 mg total) by mouth daily.  Dispense: 90  tablet; Refill: 3 - furosemide (LASIX) 40 MG tablet; Take 1 tablet (40 mg total) by mouth daily.  Dispense: 90 tablet; Refill: 3 - carvedilol (COREG) 25 MG tablet; Take 1 tablet (25 mg total) by mouth 2 (two) times daily with a meal.  Dispense: 180 tablet; Refill: 3  Labs - COMPLETE METABOLIC PANEL WITH GFR in view of chronic use of spironolactone and furosemide  Patient was counseled on nutrition and exercise   Return in about 6 months (around 10/24/2013), or if symptoms worsen or fail to improve, for Heart Failure and Hypertension, Follow up HTN.  The patient was given clear instructions to go to ER or return to medical center if symptoms don't improve, worsen or new problems develop. The patient verbalized understanding. The patient was told to call to get lab results if they haven't heard anything in the next week.   This note has been created with Surveyor, quantity. Any transcriptional errors are unintentional.    Angelica Chessman, MD, [place], [place], [place] [facility] [place], [place]   04/24/2013, 10:16 AM

## 2013-05-15 ENCOUNTER — Telehealth: Payer: Self-pay | Admitting: Emergency Medicine

## 2013-05-15 NOTE — Telephone Encounter (Signed)
Message copied by Ricci Barker on Thu May 15, 2013  5:47 PM ------      Message from: Angelica Chessman E      Created: Wed May 14, 2013  4:46 PM       Please inform patient that her laboratory tests results shows improving cholesterol level and stable low kidney function ------

## 2013-05-15 NOTE — Telephone Encounter (Signed)
Pt given lab results 

## 2013-10-07 ENCOUNTER — Encounter: Payer: Self-pay | Admitting: Internal Medicine

## 2013-10-07 ENCOUNTER — Ambulatory Visit: Payer: No Typology Code available for payment source | Attending: Internal Medicine | Admitting: Internal Medicine

## 2013-10-07 VITALS — BP 145/88 | HR 66 | Temp 98.3°F | Resp 16 | Ht 62.0 in | Wt 183.0 lb

## 2013-10-07 DIAGNOSIS — E785 Hyperlipidemia, unspecified: Secondary | ICD-10-CM | POA: Insufficient documentation

## 2013-10-07 DIAGNOSIS — I472 Ventricular tachycardia, unspecified: Secondary | ICD-10-CM | POA: Insufficient documentation

## 2013-10-07 DIAGNOSIS — Z23 Encounter for immunization: Secondary | ICD-10-CM | POA: Insufficient documentation

## 2013-10-07 DIAGNOSIS — E7800 Pure hypercholesterolemia, unspecified: Secondary | ICD-10-CM

## 2013-10-07 DIAGNOSIS — Z90710 Acquired absence of both cervix and uterus: Secondary | ICD-10-CM | POA: Insufficient documentation

## 2013-10-07 DIAGNOSIS — I4729 Other ventricular tachycardia: Secondary | ICD-10-CM | POA: Insufficient documentation

## 2013-10-07 DIAGNOSIS — I10 Essential (primary) hypertension: Secondary | ICD-10-CM | POA: Insufficient documentation

## 2013-10-07 LAB — BASIC METABOLIC PANEL
BUN: 21 mg/dL (ref 6–23)
CHLORIDE: 104 meq/L (ref 96–112)
CO2: 24 meq/L (ref 19–32)
Calcium: 9.6 mg/dL (ref 8.4–10.5)
Creat: 1.37 mg/dL — ABNORMAL HIGH (ref 0.50–1.10)
GLUCOSE: 90 mg/dL (ref 70–99)
POTASSIUM: 4.1 meq/L (ref 3.5–5.3)
SODIUM: 139 meq/L (ref 135–145)

## 2013-10-07 MED ORDER — FUROSEMIDE 40 MG PO TABS: 40.0000 mg | ORAL_TABLET | Freq: Every day | ORAL | Status: DC

## 2013-10-07 MED ORDER — VERAPAMIL HCL 120 MG PO TABS: ORAL_TABLET | ORAL | Status: DC

## 2013-10-07 MED ORDER — CARVEDILOL 25 MG PO TABS: 25.0000 mg | ORAL_TABLET | Freq: Two times a day (BID) | ORAL | Status: DC

## 2013-10-07 MED ORDER — LOVASTATIN 20 MG PO TABS: 20.0000 mg | ORAL_TABLET | Freq: Every day | ORAL | Status: DC

## 2013-10-07 MED ORDER — SPIRONOLACTONE 25 MG PO TABS: 25.0000 mg | ORAL_TABLET | Freq: Every day | ORAL | Status: DC

## 2013-10-07 NOTE — Patient Instructions (Signed)
Arthritis, Nonspecific Arthritis is inflammation of a joint. This usually means pain, redness, warmth or swelling are present. One or more joints may be involved. There are a number of types of arthritis. Your caregiver may not be able to tell what type of arthritis you have right away. CAUSES  The most common cause of arthritis is the wear and tear on the joint (osteoarthritis). This causes damage to the cartilage, which can break down over time. The knees, hips, back and neck are most often affected by this type of arthritis. Other types of arthritis and common causes of joint pain include:  Sprains and other injuries near the joint. Sometimes minor sprains and injuries cause pain and swelling that develop hours later.  Rheumatoid arthritis. This affects hands, feet and knees. It usually affects both sides of your body at the same time. It is often associated with chronic ailments, fever, weight loss and general weakness.  Crystal arthritis. Gout and pseudo gout can cause occasional acute severe pain, redness and swelling in the foot, ankle, or knee.  Infectious arthritis. Bacteria can get into a joint through a break in overlying skin. This can cause infection of the joint. Bacteria and viruses can also spread through the blood and affect your joints.  Drug, infectious and allergy reactions. Sometimes joints can become mildly painful and slightly swollen with these types of illnesses. SYMPTOMS   Pain is the main symptom.  Your joint or joints can also be red, swollen and warm or hot to the touch.  You may have a fever with certain types of arthritis, or even feel overall ill.  The joint with arthritis will hurt with movement. Stiffness is present with some types of arthritis. DIAGNOSIS  Your caregiver will suspect arthritis based on your description of your symptoms and on your exam. Testing may be needed to find the type of arthritis:  Blood and sometimes urine tests.  X-ray tests  and sometimes CT or MRI scans.  Removal of fluid from the joint (arthrocentesis) is done to check for bacteria, crystals or other causes. Your caregiver (or a specialist) will numb the area over the joint with a local anesthetic, and use a needle to remove joint fluid for examination. This procedure is only minimally uncomfortable.  Even with these tests, your caregiver may not be able to tell what kind of arthritis you have. Consultation with a specialist (rheumatologist) may be helpful. TREATMENT  Your caregiver will discuss with you treatment specific to your type of arthritis. If the specific type cannot be determined, then the following general recommendations may apply. Treatment of severe joint pain includes:  Rest.  Elevation.  Anti-inflammatory medication (for example, ibuprofen) may be prescribed. Avoiding activities that cause increased pain.  Only take over-the-counter or prescription medicines for pain and discomfort as recommended by your caregiver.  Cold packs over an inflamed joint may be used for 10 to 15 minutes every hour. Hot packs sometimes feel better, but do not use overnight. Do not use hot packs if you are diabetic without your caregiver's permission.  A cortisone shot into arthritic joints may help reduce pain and swelling.  Any acute arthritis that gets worse over the next 1 to 2 days needs to be looked at to be sure there is no joint infection. Long-term arthritis treatment involves modifying activities and lifestyle to reduce joint stress jarring. This can include weight loss. Also, exercise is needed to nourish the joint cartilage and remove waste. This helps keep the muscles   around the joint strong. HOME CARE INSTRUCTIONS   Do not take aspirin to relieve pain if gout is suspected. This elevates uric acid levels.  Only take over-the-counter or prescription medicines for pain, discomfort or fever as directed by your caregiver.  Rest the joint as much as  possible.  If your joint is swollen, keep it elevated.  Use crutches if the painful joint is in your leg.  Drinking plenty of fluids may help for certain types of arthritis.  Follow your caregiver's dietary instructions.  Try low-impact exercise such as:  Swimming.  Water aerobics.  Biking.  Walking.  Morning stiffness is often relieved by a warm shower.  Put your joints through regular range-of-motion. SEEK MEDICAL CARE IF:   You do not feel better in 24 hours or are getting worse.  You have side effects to medications, or are not getting better with treatment. SEEK IMMEDIATE MEDICAL CARE IF:   You have a fever.  You develop severe joint pain, swelling or redness.  Many joints are involved and become painful and swollen.  There is severe back pain and/or leg weakness.  You have loss of bowel or bladder control. Document Released: 02/03/2004 Document Revised: 03/20/2011 Document Reviewed: 02/19/2008 [facility] Patient Information 2015 [place], [place]. This information is not intended to replace advice given to you by your health care provider. Make sure you discuss any questions you have with your health care provider. Hypertension Hypertension, commonly called high blood pressure, is when the force of blood pumping through your arteries is too strong. Your arteries are the blood vessels that carry blood from your heart throughout your body. A blood pressure reading consists of a higher number over a lower number, such as 110/72. The higher number (systolic) is the pressure inside your arteries when your heart pumps. The lower number (diastolic) is the pressure inside your arteries when your heart relaxes. Ideally you want your blood pressure below 120/80. Hypertension forces your heart to work harder to pump blood. Your arteries may become narrow or stiff. Having hypertension puts you at risk for heart disease, stroke, and other problems.  RISK FACTORS Some risk factors  for high blood pressure are controllable. Others are not.  Risk factors you cannot control include:   Race. You may be at higher risk if you are African American.  Age. Risk increases with age.  Gender. Men are at higher risk than women before age 42 years. After age 44, women are at higher risk than men. Risk factors you can control include:  Not getting enough exercise or physical activity.  Being overweight.  Getting too much fat, sugar, calories, or salt in your diet.  Drinking too much alcohol. SIGNS AND SYMPTOMS Hypertension does not usually cause signs or symptoms. Extremely high blood pressure (hypertensive crisis) may cause headache, anxiety, shortness of breath, and nosebleed. DIAGNOSIS  To check if you have hypertension, your health care provider will measure your blood pressure while you are seated, with your arm held at the level of your heart. It should be measured at least twice using the same arm. Certain conditions can cause a difference in blood pressure between your right and left arms. A blood pressure reading that is higher than normal on one occasion does not mean that you need treatment. If one blood pressure reading is high, ask your health care provider about having it checked again. TREATMENT  Treating high blood pressure includes making lifestyle changes and possibly taking medicine. Living a healthy lifestyle can  help lower high blood pressure. You may need to change some of your habits. Lifestyle changes may include:  Following the DASH diet. This diet is high in fruits, vegetables, and whole grains. It is low in salt, red meat, and added sugars.  Getting at least 2 hours of brisk physical activity every week.  Losing weight if necessary.  Not smoking.  Limiting alcoholic beverages.  Learning ways to reduce stress. If lifestyle changes are not enough to get your blood pressure under control, your health care provider may prescribe medicine. You may  need to take more than one. Work closely with your health care provider to understand the risks and benefits. HOME CARE INSTRUCTIONS  Have your blood pressure rechecked as directed by your health care provider.   Take medicines only as directed by your health care provider. Follow the directions carefully. Blood pressure medicines must be taken as prescribed. The medicine does not work as well when you skip doses. Skipping doses also puts you at risk for problems.   Do not smoke.   Monitor your blood pressure at home as directed by your health care provider. SEEK MEDICAL CARE IF:   You think you are having a reaction to medicines taken.  You have recurrent headaches or feel dizzy.  You have swelling in your ankles.  You have trouble with your vision. SEEK IMMEDIATE MEDICAL CARE IF:  You develop a severe headache or confusion.  You have unusual weakness, numbness, or feel faint.  You have severe chest or abdominal pain.  You vomit repeatedly.  You have trouble breathing. MAKE SURE YOU:   Understand these instructions.  Will watch your condition.  Will get help right away if you are not doing well or get worse. Document Released: 12/26/2004 Document Revised: 05/12/2013 Document Reviewed: 10/18/2012 [facility] Patient Information 2015 [place], [place]. This information is not intended to replace advice given to you by your health care provider. Make sure you discuss any questions you have with your health care provider.

## 2013-10-07 NOTE — Progress Notes (Signed)
[name], female   DOB: [birth year], 63 y.o.   MRN: [id number]   Michelle Hurley, is a 63 y.o. female  GUR:[id number]  EGB:[id number]  DOB - 02-Nov-1950  Chief Complaint  Patient presents with  . Follow-up  . Hypertension  . Medication Refill  . Hand Pain    joint pain        Subjective:   Michelle Hurley is a 63 y.o. female here today for a follow up visit. Patient with history of hypertension, hyperlipidemia, generalized anxiety disorder, came to the clinic today for follow-up. She complains of freq episodes of joint pain in hands/feet x 2 weeks. Pt states she was driving last week when right hand locked on her for 10 minutes with sharp pain traveling up arm. Hx Arthritis reported not taking pain reliever for sx's. States sx has been intermittent. Patient has No headache, No chest pain, No abdominal pain - No Nausea, No new weakness tingling or numbness, No Cough - SOB. Patient needs refill on her medications.  No problems updated.  ALLERGIES: Allergies  Allergen Reactions  . Codeine Nausea And Vomiting  . Prednisone Nausea And Vomiting    PAST MEDICAL HISTORY: Past Medical History  Diagnosis Date  . Myopia   . Anxiety state, unspecified   . Morbid obesity   . Other and unspecified hyperlipidemia   . Unspecified essential hypertension   . Osteopenia   . Umbilical hernia   . Palpitations   . Arthritis   . H/O: hysterectomy   . NSVT (nonsustained ventricular tachycardia)     a. 01/2005 Echo: EF 55-65%    MEDICATIONS AT HOME: Prior to Admission medications   Medication Sig Start Date End Date Taking? Authorizing Provider  carvedilol (COREG) 25 MG tablet Take 1 tablet (25 mg total) by mouth 2 (two) times daily with a meal. 10/07/13  Yes Hayes Czaja E Hyman Hopes, MD  furosemide (LASIX) 40 MG tablet Take 1 tablet (40 mg total) by mouth daily. 10/07/13  Yes Quentin Angst, MD  lovastatin (MEVACOR) 20 MG tablet Take 1 tablet (20 mg total) by mouth at  bedtime. 10/07/13  Yes Quentin Angst, MD  spironolactone (ALDACTONE) 25 MG tablet Take 1 tablet (25 mg total) by mouth daily. 10/07/13  Yes Quentin Angst, MD  verapamil (CALAN) 120 MG tablet TAKE ONE-HALF TABLET BY MOUTH THREE TIMES DAILY 10/07/13  Yes Quentin Angst, MD  ibuprofen (ADVIL,MOTRIN) 200 MG tablet Take 200 mg by mouth every 6 (six) hours as needed.      Historical Provider, MD     Objective:   Filed Vitals:   10/07/13 1146  BP: 145/88  Pulse: 66  Temp: 98.3 F (36.8 C)  TempSrc: Oral  Resp: 16  Height: 5\' 2"  (1.575 m)  Weight: 183 lb (83.008 kg)  SpO2: 99%    Exam General appearance : Awake, alert, not in any distress. Speech Clear. Not toxic looking HEENT: Atraumatic and Normocephalic, pupils equally reactive to light and accomodation Neck: supple, no JVD. No cervical lymphadenopathy.  Chest:Good air entry bilaterally, no added sounds  CVS: S1 S2 regular, no murmurs.  Abdomen: Bowel sounds present, Non tender and not distended with no gaurding, rigidity or rebound. Extremities: B/L Lower Ext shows no edema, both legs are warm to touch Neurology: Awake alert, and oriented X 3, CN II-XII intact, Non focal Data Review Lab Results  Component Value Date   HGBA1C 5.9 04/24/2013   HGBA1C 5.7 08/15/2012   HGBA1C 6.4* 04/06/2012  Assessment & Plan   1. Need for prophylactic vaccination and inoculation against influenza Flu vaccine given  2. High cholesterol  - lovastatin (MEVACOR) 20 MG tablet; Take 1 tablet (20 mg total) by mouth at bedtime.  Dispense: 90 tablet; Refill: 3  3. Unspecified essential hypertension  - carvedilol (COREG) 25 MG tablet; Take 1 tablet (25 mg total) by mouth 2 (two) times daily with a meal.  Dispense: 180 tablet; Refill: 3 - furosemide (LASIX) 40 MG tablet; Take 1 tablet (40 mg total) by mouth daily.  Dispense: 90 tablet; Refill: 3 - spironolactone (ALDACTONE) 25 MG tablet; Take 1 tablet (25 mg total) by mouth daily.   Dispense: 90 tablet; Refill: 3  - Basic Metabolic Panel - We have discussed target BP range and blood pressure goal - I have advised patient to check BP regularly and to call us back or report to clinic if the numbers are consistently higher than 140/90  - We discussed the importance of compliance with medical therapy and DASH diet recommended, consequences of uncontrolled hypertension discussed.    4. Paroxysmal ventricular tachycardia  - verapamil (CALAN) 120 MG tablet; TAKE ONE-HALF TABLET BY MOUTH THREE TIMES DAILY  Dispense: 90 tablet; Refill: 3   Return in about 3 months (around 01/06/2014) for Follow up HTN.  The patient was given clear instructions to go to ER or return to medical center if symptoms don't improve, worsen or new problems develop. The patient verbalized understanding. The patient was told to call to get lab results if they haven't heard anything in the next week.   This note has been created with Education officer, environmental. Any transcriptional errors are unintentional.    Jeanann Lewandowsky, MD, MHA, FACP, FAAP [facility] [place], [place] [phone]   10/07/2013, 12:31 PM

## 2013-10-07 NOTE — Progress Notes (Signed)
Pt here to f/u with freq episodes of joint pain in hands/feet x 2 weeks Pt states she was driving last week when right hand locked on her for 10 minutes with sharp pain traveling up arm Hx Arthritis reported,not taking pain reliever for sx's States sx has been intermit  Flu vaccine given

## 2013-10-08 ENCOUNTER — Telehealth: Payer: Self-pay | Admitting: Emergency Medicine

## 2013-10-08 NOTE — Telephone Encounter (Signed)
Left message for pt to call for lab results with medication instructions

## 2013-10-08 NOTE — Telephone Encounter (Signed)
Message copied by Ricci Barker on Wed Oct 08, 2013  1:51 PM ------      Message from: Angelica Chessman E      Created: Wed Oct 08, 2013  9:45 AM       Please inform patient that her blood test shows that the kidney function is slightly worse than 5 months ago possibly from high blood pressure. Reduce furosemide dose to 20 mg by mouth daily, and take verapamil 120 mg tablet by mouth 3 times a day instead of twice daily. Please call to clinic in 2 weeks for blood pressure check. ------

## 2013-10-17 ENCOUNTER — Telehealth: Payer: Self-pay | Admitting: Internal Medicine

## 2013-10-17 NOTE — Telephone Encounter (Signed)
Pt. Called stating that she needs to get some paperwork filled out and that [facility] will call to speak to doctor, pt. Is trying to get medications filled and mailed to her. Please f/u with pt. If necessary.

## 2013-10-17 NOTE — Telephone Encounter (Signed)
Attempted to reach pt in regards to medication being sent to [facility]. Nurse line left on message

## 2013-10-27 ENCOUNTER — Ambulatory Visit: Payer: Self-pay | Admitting: Internal Medicine

## 2014-03-17 ENCOUNTER — Other Ambulatory Visit: Payer: Self-pay | Admitting: Internal Medicine

## 2014-05-03 ENCOUNTER — Encounter (HOSPITAL_COMMUNITY): Payer: Self-pay | Admitting: Emergency Medicine

## 2014-05-03 ENCOUNTER — Encounter (HOSPITAL_COMMUNITY): Admission: EM | Disposition: A | Discharge status: Home / Self Care | Payer: Self-pay | Source: Home / Self Care

## 2014-05-03 ENCOUNTER — Emergency Department (HOSPITAL_COMMUNITY): Payer: Commercial Managed Care - HMO | Admitting: Anesthesiology

## 2014-05-03 ENCOUNTER — Emergency Department (HOSPITAL_COMMUNITY): Payer: Commercial Managed Care - HMO

## 2014-05-03 ENCOUNTER — Inpatient Hospital Stay (HOSPITAL_COMMUNITY)
Admission: EM | Admit: 2014-05-03 | Discharge: 2014-05-07 | DRG: 355 | Disposition: A | Discharge status: Home / Self Care | Payer: Commercial Managed Care - HMO | Attending: General Surgery | Admitting: General Surgery

## 2014-05-03 DIAGNOSIS — R110 Nausea: Secondary | ICD-10-CM | POA: Diagnosis not present

## 2014-05-03 DIAGNOSIS — K2970 Gastritis, unspecified, without bleeding: Secondary | ICD-10-CM | POA: Diagnosis not present

## 2014-05-03 DIAGNOSIS — Z886 Allergy status to analgesic agent status: Secondary | ICD-10-CM

## 2014-05-03 DIAGNOSIS — Z6833 Body mass index (BMI) 33.0-33.9, adult: Secondary | ICD-10-CM | POA: Diagnosis not present

## 2014-05-03 DIAGNOSIS — I10 Essential (primary) hypertension: Secondary | ICD-10-CM | POA: Diagnosis present

## 2014-05-03 DIAGNOSIS — Z880 Allergy status to penicillin: Secondary | ICD-10-CM | POA: Diagnosis not present

## 2014-05-03 DIAGNOSIS — R109 Unspecified abdominal pain: Secondary | ICD-10-CM | POA: Diagnosis present

## 2014-05-03 DIAGNOSIS — Z90710 Acquired absence of both cervix and uterus: Secondary | ICD-10-CM | POA: Diagnosis not present

## 2014-05-03 DIAGNOSIS — K420 Umbilical hernia with obstruction, without gangrene: Secondary | ICD-10-CM | POA: Diagnosis not present

## 2014-05-03 DIAGNOSIS — K439 Ventral hernia without obstruction or gangrene: Secondary | ICD-10-CM | POA: Diagnosis not present

## 2014-05-03 DIAGNOSIS — Z91018 Allergy to other foods: Secondary | ICD-10-CM | POA: Diagnosis not present

## 2014-05-03 DIAGNOSIS — K436 Other and unspecified ventral hernia with obstruction, without gangrene: Principal | ICD-10-CM | POA: Diagnosis present

## 2014-05-03 DIAGNOSIS — Z9889 Other specified postprocedural states: Secondary | ICD-10-CM

## 2014-05-03 DIAGNOSIS — K460 Unspecified abdominal hernia with obstruction, without gangrene: Secondary | ICD-10-CM

## 2014-05-03 DIAGNOSIS — R52 Pain, unspecified: Secondary | ICD-10-CM

## 2014-05-03 DIAGNOSIS — Z8719 Personal history of other diseases of the digestive system: Secondary | ICD-10-CM

## 2014-05-03 DIAGNOSIS — K5730 Diverticulosis of large intestine without perforation or abscess without bleeding: Secondary | ICD-10-CM | POA: Diagnosis not present

## 2014-05-03 DIAGNOSIS — N3289 Other specified disorders of bladder: Secondary | ICD-10-CM | POA: Diagnosis not present

## 2014-05-03 HISTORY — PX: LAPAROSCOPIC TRANSABDOMINAL HERNIA: SHX5933

## 2014-05-03 HISTORY — DX: Essential (primary) hypertension: I10

## 2014-05-03 LAB — CBC WITH DIFFERENTIAL/PLATELET
Basophils Absolute: 0 10*3/uL (ref 0.0–0.1)
Basophils Relative: 0 % (ref 0–1)
Eosinophils Absolute: 0 10*3/uL (ref 0.0–0.7)
Eosinophils Relative: 0 % (ref 0–5)
HCT: 43.2 % (ref 36.0–46.0)
Hemoglobin: 14.1 g/dL (ref 12.0–15.0)
Lymphocytes Relative: 20 % (ref 12–46)
Lymphs Abs: 1.2 10*3/uL (ref 0.7–4.0)
MCH: 27 pg (ref 26.0–34.0)
MCHC: 32.6 g/dL (ref 30.0–36.0)
MCV: 82.6 fL (ref 78.0–100.0)
Monocytes Absolute: 0.3 10*3/uL (ref 0.1–1.0)
Monocytes Relative: 6 % (ref 3–12)
Neutro Abs: 4.2 10*3/uL (ref 1.7–7.7)
Neutrophils Relative %: 74 % (ref 43–77)
Platelets: 243 10*3/uL (ref 150–400)
RBC: 5.23 MIL/uL — ABNORMAL HIGH (ref 3.87–5.11)
RDW: 14.3 % (ref 11.5–15.5)
WBC: 5.8 10*3/uL (ref 4.0–10.5)

## 2014-05-03 LAB — URINALYSIS, ROUTINE W REFLEX MICROSCOPIC
Bilirubin Urine: NEGATIVE
Glucose, UA: NEGATIVE mg/dL
Hgb urine dipstick: NEGATIVE
Ketones, ur: 15 mg/dL — AB
Leukocytes, UA: NEGATIVE
Nitrite: NEGATIVE
Protein, ur: NEGATIVE mg/dL
Specific Gravity, Urine: 1.017 (ref 1.005–1.030)
Urobilinogen, UA: 1 mg/dL (ref 0.0–1.0)
pH: 7 (ref 5.0–8.0)

## 2014-05-03 LAB — COMPREHENSIVE METABOLIC PANEL
ALT: 23 U/L (ref 0–35)
AST: 22 U/L (ref 0–37)
Albumin: 4.4 g/dL (ref 3.5–5.2)
Alkaline Phosphatase: 61 U/L (ref 39–117)
Anion gap: 9 (ref 5–15)
BUN: 20 mg/dL (ref 6–23)
CO2: 27 mmol/L (ref 19–32)
Calcium: 9.6 mg/dL (ref 8.4–10.5)
Chloride: 103 mmol/L (ref 96–112)
Creatinine, Ser: 1.28 mg/dL — ABNORMAL HIGH (ref 0.50–1.10)
GFR calc Af Amer: 50 mL/min — ABNORMAL LOW (ref >90)
GFR calc non Af Amer: 43 mL/min — ABNORMAL LOW (ref >90)
Glucose, Bld: 95 mg/dL (ref 70–99)
Potassium: 4 mmol/L (ref 3.5–5.1)
Sodium: 139 mmol/L (ref 135–145)
Total Bilirubin: 0.6 mg/dL (ref 0.3–1.2)
Total Protein: 8.5 g/dL — ABNORMAL HIGH (ref 6.0–8.3)

## 2014-05-03 LAB — LIPASE, BLOOD: Lipase: 23 U/L (ref 11–59)

## 2014-05-03 SURGERY — REPAIR, HERNIA, LAPAROSCOPIC, ABDOMINAL APPROACH
Anesthesia: General

## 2014-05-03 MED ORDER — ONDANSETRON HCL 4 MG/2ML IJ SOLN
INTRAMUSCULAR | Status: AC
  Filled 2014-05-03: qty 2

## 2014-05-03 MED ORDER — BUPIVACAINE-EPINEPHRINE (PF) 0.25% -1:200000 IJ SOLN
INTRAMUSCULAR | Status: AC
  Filled 2014-05-03: qty 30

## 2014-05-03 MED ORDER — CISATRACURIUM BESYLATE 20 MG/10ML IV SOLN
INTRAVENOUS | Status: AC
  Filled 2014-05-03: qty 10

## 2014-05-03 MED ORDER — LIDOCAINE HCL (CARDIAC) 20 MG/ML IV SOLN
INTRAVENOUS | Status: AC
  Filled 2014-05-03: qty 5

## 2014-05-03 MED ORDER — HYDROMORPHONE HCL 1 MG/ML IJ SOLN
1.0000 mg | Freq: Once | INTRAMUSCULAR | Status: AC
Start: 2014-05-03 — End: 2014-05-03
  Administered 2014-05-03: 1 mg via INTRAVENOUS
  Filled 2014-05-03: qty 1

## 2014-05-03 MED ORDER — SODIUM CHLORIDE 0.9 % IV BOLUS (SEPSIS)
1000.0000 mL | Freq: Once | INTRAVENOUS | Status: AC
  Administered 2014-05-03: 1000 mL via INTRAVENOUS

## 2014-05-03 MED ORDER — FENTANYL CITRATE (PF) 250 MCG/5ML IJ SOLN
INTRAMUSCULAR | Status: AC
  Filled 2014-05-03: qty 5

## 2014-05-03 MED ORDER — DEXTROSE 5 % IV SOLN
2.0000 g | Freq: Once | INTRAVENOUS | Status: AC
  Administered 2014-05-03: 2 g via INTRAVENOUS

## 2014-05-03 MED ORDER — CEFOTETAN DISODIUM-DEXTROSE 2-2.08 GM-% IV SOLR
INTRAVENOUS | Status: AC
  Filled 2014-05-03: qty 50

## 2014-05-03 MED ORDER — LACTATED RINGERS IV SOLN
INTRAVENOUS | Status: DC | PRN
  Administered 2014-05-03 – 2014-05-04 (×2): via INTRAVENOUS

## 2014-05-03 MED ORDER — PROPOFOL 10 MG/ML IV BOLUS
INTRAVENOUS | Status: AC
  Filled 2014-05-03: qty 20

## 2014-05-03 SURGICAL SUPPLY — 23 items
DRAPE INCISE IOBAN 66X45 STRL (DRAPES) ×2 IMPLANT
DRAPE LAPAROSCOPIC ABDOMINAL (DRAPES) ×2 IMPLANT
ELECT REM PT RETURN 9FT ADLT (ELECTROSURGICAL) ×3
ELECTRODE REM PT RTRN 9FT ADLT (ELECTROSURGICAL) IMPLANT
GLOVE SURG SIGNA 7.5 PF LTX (GLOVE) ×12 IMPLANT
GOWN STRL REUS W/ TWL XL LVL3 (GOWN DISPOSABLE) IMPLANT
GOWN STRL REUS W/TWL XL LVL3 (GOWN DISPOSABLE) ×9
HOVERMATT SINGLE USE (MISCELLANEOUS) ×2 IMPLANT
KIT BASIN OR (CUSTOM PROCEDURE TRAY) ×2 IMPLANT
LIQUID BAND (GAUZE/BANDAGES/DRESSINGS) ×2 IMPLANT
MARKER SKIN DUAL TIP RULER LAB (MISCELLANEOUS) ×2 IMPLANT
NS IRRIG 1000ML POUR BTL (IV SOLUTION) ×2 IMPLANT
PENCIL BUTTON HOLSTER BLD 10FT (ELECTRODE) ×2 IMPLANT
SET IRRIG TUBING LAPAROSCOPIC (IRRIGATION / IRRIGATOR) ×2 IMPLANT
SLEEVE XCEL OPT CAN 5 100 (ENDOMECHANICALS) ×2 IMPLANT
SUT MNCRL AB 4-0 PS2 18 (SUTURE) ×2 IMPLANT
SUT NOVA NAB GS-21 0 18 T12 DT (SUTURE) ×4 IMPLANT
SUT VIC AB 2-0 SH 18 (SUTURE) ×2 IMPLANT
TOWEL OR 17X26 10 PK STRL BLUE (TOWEL DISPOSABLE) ×2 IMPLANT
TRAY LAPAROSCOPIC (CUSTOM PROCEDURE TRAY) ×2 IMPLANT
TROCAR BLADELESS OPT 5 100 (ENDOMECHANICALS) ×2 IMPLANT
TUBING INSUFFLATION 10FT LAP (TUBING) ×2 IMPLANT
YANKAUER SUCT BULB TIP 10FT TU (MISCELLANEOUS) ×2 IMPLANT

## 2014-05-03 NOTE — ED Provider Notes (Signed)
CSN: [id number]     Arrival date & time 05/03/14  1724 History   First MD Initiated Contact with Patient 05/03/14 1752     Chief Complaint  Patient presents with  . Abdominal Pain  . Hernia     (Consider location/radiation/quality/duration/timing/severity/associated sxs/prior Treatment) HPI Patient presents to the emergency department with sudden onset of mid abdominal pain just above the umbilicus.  The patient has had a hernia just above the umbilicus for the last 2 years.  She says she has not had any issues with that.  The patient states that palpation makes her pain worse.  The patient states that the pain has been very severe.  She was given fentanyl in route which seemed to help her pain somewhat, but did not totally resolve it.  The patient denies chest pain, nausea, vomiting, weakness, dizziness, headache, blurred vision, fever, back pain, neck pain, dysuria, incontinence, rash, or syncope. Past Medical History  Diagnosis Date  . Hypertension    History reviewed. No pertinent past surgical history. History reviewed. No pertinent family history. History  Substance Use Topics  . Smoking status: Never Smoker   . Smokeless tobacco: Not on file  . Alcohol Use: No   OB History    No data available     Review of Systems  All other systems negative except as documented in the HPI. All pertinent positives and negatives as reviewed in the HPI.  Allergies  Mushroom extract complex; Codeine; and Penicillins  Home Medications   Prior to Admission medications   Medication Sig Start Date End Date Taking? Authorizing Provider  carvedilol (COREG) 25 MG tablet Take 25 mg by mouth 2 (two) times daily with a meal.   Yes Historical Provider, MD  furosemide (LASIX) 40 MG tablet Take 40 mg by mouth daily.   Yes Historical Provider, MD  loratadine (CLARITIN) 10 MG tablet Take 10 mg by mouth daily as needed for allergies (allergies).   Yes Historical Provider, MD  lovastatin (MEVACOR) 20  MG tablet Take 20 mg by mouth at bedtime.   Yes Historical Provider, MD  spironolactone (ALDACTONE) 25 MG tablet Take 25 mg by mouth daily.   Yes Historical Provider, MD  verapamil (VERELAN PM) 120 MG 24 hr capsule Take 60 mg by mouth 3 (three) times daily.   Yes Historical Provider, MD   BP 135/88 mmHg  Pulse 69  Temp(Src) 97.6 F (36.4 C) (Oral)  Resp 21  SpO2 98% Physical Exam  Constitutional: She is oriented to person, place, and time. She appears well-developed and well-nourished. She appears distressed.  HENT:  Head: Normocephalic and atraumatic.  Mouth/Throat: Oropharynx is clear and moist.  Eyes: Pupils are equal, round, and reactive to light.  Neck: Normal range of motion. Neck supple.  Cardiovascular: Normal rate, regular rhythm and normal heart sounds.  Exam reveals no gallop and no friction rub.   Pulmonary/Chest: Effort normal and breath sounds normal.  Abdominal: Soft. Normal appearance and bowel sounds are normal. There is no rigidity and no guarding. A hernia is present. Hernia confirmed positive in the ventral area.    Musculoskeletal: She exhibits no edema.  Neurological: She is alert and oriented to person, place, and time. She exhibits normal muscle tone. Coordination normal.  Skin: Skin is warm and dry. No rash noted. No erythema.  Nursing note and vitals reviewed.   ED Course  Procedures (including critical care time) Labs Review Labs Reviewed  CBC WITH DIFFERENTIAL/PLATELET - Abnormal; Notable for the following:  RBC 5.23 (*)    All other components within normal limits  COMPREHENSIVE METABOLIC PANEL - Abnormal; Notable for the following:    Creatinine, Ser 1.28 (*)    Total Protein 8.5 (*)    GFR calc non Af Amer 43 (*)    GFR calc Af Amer 50 (*)    All other components within normal limits  URINALYSIS, ROUTINE W REFLEX MICROSCOPIC - Abnormal; Notable for the following:    Ketones, ur 15 (*)    All other components within normal limits  URINE  CULTURE  LIPASE, BLOOD    Imaging Review Ct Abdomen Pelvis Wo Contrast  05/03/2014   CLINICAL DATA:  Acute onset of umbilical hernia pain. Evaluate for incarceration. Initial encounter.  EXAM: CT ABDOMEN AND PELVIS WITHOUT CONTRAST  TECHNIQUE: Multidetector CT imaging of the abdomen and pelvis was performed following the standard protocol without IV contrast.  COMPARISON:  None.  FINDINGS: Minimal right basilar atelectasis is noted.  There is incarceration of a short segment of proximal ileum at the level of a midline anterior abdominal wall hernia just superior to the umbilicus. The hernia is moderate in size, with mild fluid noted about the herniated segment. The herniated segment is distended to 4.2 x 4.1 x 2.8 cm. There is decompression of more distal small bowel loops, though no significant dilatation of more proximal loops, likely reflecting partial small bowel obstruction. Given the dilatation of the herniated segment and a relatively narrow hernia neck, this will not resolve on its own.  The liver and spleen are unremarkable in appearance. The gallbladder is within normal limits. The pancreas and adrenal glands are unremarkable.  The kidneys are unremarkable in appearance. There is no evidence of hydronephrosis. No renal or ureteral stones are seen. No perinephric stranding is appreciated.  No free fluid is identified. The small bowel is unremarkable in appearance. The stomach is within normal limits. No acute vascular abnormalities are seen.  The appendix is not definitely characterized; there is no evidence of appendicitis. Mild apparent wall thickening along the transverse colon is thought reflect relative decompression. Mild diverticulosis is noted along the distal descending colon. The colon is otherwise unremarkable in appearance.  The bladder is mildly distended and grossly unremarkable. The patient is status post hysterectomy. No suspicious adnexal masses are seen. No inguinal lymphadenopathy  is seen.  No acute osseous abnormalities are identified. Mild facet disease is noted at the lower lumbar spine.  IMPRESSION: 1. Incarceration of a short segment of proximal ileum at the level of a midline anterior abdominal wall hernia, just superior to the umbilicus. The hernia is moderate in size, with mild fluid about the herniated segment of small bowel. Distention of the herniated segment to 4.2 x 4.1 x 2.8 cm, with decompression of more distal small bowel loops, likely reflecting partial small bowel obstruction. Given dilatation of the herniated segment and a relatively narrow hernia neck, this will not resolve on its own. 2. Mild diverticulosis along the distal descending colon, without evidence of diverticulitis.  These results were called by telephone at the time of interpretation on 05/03/2014 at 9:41 pm to [facility] PA, who verbally acknowledged these results.   Electronically Signed   By: Garald Balding M.D.   On: 05/03/2014 21:42     EKG Interpretation None     I spoke with the general surgeon immediately after seeing the patient about my concern with an incarcerated hernia.  We discussed the plan and the patient and we are  waiting for the CT scan results.  I then re-paged him once the scan results are back.  Confirmed a incarcerated segment of proximal ileum.  The patient is made aware of the plan.  The surgeon will be in to evaluate her and take her to the operating room   Dalia Heading, PA-C 05/04/14 0157  Milton Ferguson, MD 05/05/14 1556

## 2014-05-03 NOTE — H&P (Signed)
Re:   Michelle Hurley DOB:   10/26/1950 MRN:   [id number]  ASSESSMENT AND PLAN: 1.  Incarcerated ventral hernia with small bowel in hernia.  I discussed the indications and complications of hernia surgery with the patient.  I discussed both the laparoscopic and open approach to hernia repair..  The potential risks of hernia surgery include, but are not limited to, bleeding, infection, open surgery, nerve injury, and recurrence of the hernia.  I discussed the possibility of small bowel resection.  I provided the patient literature about hernia surgery.  2.  Morbid obesity 3.  HTN x 20 years  Chief Complaint  Patient presents with  . Abdominal Pain  . Hernia   REFERRING PHYSICIAN: Angelica Chessman, MD  HISTORY OF PRESENT ILLNESS: Michelle Hurley is a 64 y.o. (DOB: 1950-05-30)  AA  female whose primary care physician is Angelica Chessman, MD ([place]) and comes to [facility] ER today for abdominal pain and mass. She has two sisters, [name] and April, and friend, [name], in room.  She has had this hernia for years (around 68).  But today after church, she felt swelling and abdominal discomfort.  She loosened her clothes without any benefit.  She came to the [facility] because of persistent abdominal pain.  She had a hysterectomy in 1970's for benign reasons. She thinks they took out her appendix.  She says that she had "ulcers and hiatal hernia" a long time ago.  She has had no recent GI issues.  She has not had an upper endo.  She thinks that she has had a colonoscopy, but it has been more than 10 years.  CT scan of abdomen/pelvis - 05/03/2014 - small bowel incarcerated in ventral hernia.     Past Medical History  Diagnosis Date  . Hypertension      History reviewed. No pertinent past surgical history.    No current facility-administered medications for this encounter.   Current Outpatient Prescriptions  Medication Sig Dispense Refill  . carvedilol (COREG) 25  MG tablet Take 25 mg by mouth 2 (two) times daily with a meal.    . furosemide (LASIX) 40 MG tablet Take 40 mg by mouth daily.    Marland Kitchen loratadine (CLARITIN) 10 MG tablet Take 10 mg by mouth daily as needed for allergies (allergies).    Marland Kitchen lovastatin (MEVACOR) 20 MG tablet Take 20 mg by mouth at bedtime.    Marland Kitchen spironolactone (ALDACTONE) 25 MG tablet Take 25 mg by mouth daily.    . verapamil (VERELAN PM) 120 MG 24 hr capsule Take 60 mg by mouth 3 (three) times daily.        Allergies  Allergen Reactions  . Mushroom Extract Complex Anaphylaxis, Itching and Swelling    Swelling all over body   . Codeine Nausea And Vomiting  . Penicillins     Unknown reaction     REVIEW OF SYSTEMS: Skin:  No history of rash.  No history of abnormal moles. Infection:  No history of hepatitis or HIV.  No history of MRSA. Neurologic:  No history of stroke.  No history of seizure.  No history of headaches. Cardiac:  HTN x 20 years.  No cardiac issues. Pulmonary:  Does not smoke cigarettes.  No asthma or bronchitis.  No OSA/CPAP.  Endocrine:  No diabetes. No thyroid disease. Gastrointestinal:  See HIP Urologic:  No history of kidney stones.  No history of bladder infections. Musculoskeletal:  No history of joint or back disease. Hematologic:  No bleeding disorder.  No history of anemia.  Not anticoagulated. Psycho-social:  The patient is oriented.   The patient has no obvious psychologic or social impairment to understanding our conversation and plan.  SOCIAL and FAMILY HISTORY: Unmarried. Sisters, [name] and April Bennet, in room. [name], in room. She has two children:  Son, 43 in [place] and daughter, 83, in [place].  PHYSICAL EXAM: BP 135/88 mmHg  Pulse 69  Temp(Src) 97.6 F (36.4 C) (Oral)  Resp 21  SpO2 98%  General: Obese AA F who is alert and generally healthy appearing.  HEENT: Normal. Pupils equal. Neck: Supple. No mass.  No thyroid mass. Lymph Nodes:  No  supraclavicular or cervical nodes. Lungs: Clear to auscultation and symmetric breath sounds. Heart:  RRR. No murmur or rub. Abdomen: Soft. 4 cm tender mass in epigastrium.  Decreased BS.  No peritoneal signs.  Well healed lower midline abdominal scar. Rectal: Not done. Extremities:  Good strength and ROM  in upper and lower extremities. Neurologic:  Grossly intact to motor and sensory function. Psychiatric: Has normal mood and affect. Behavior is normal.   DATA REVIEWED: Epic notes.  Alphonsa Overall, MD,  [facility], [address]  [place], [place]    [place] Phone:  [phone] FAX:  [phone]

## 2014-05-03 NOTE — ED Notes (Signed)
Bed: WA04 Expected date:  Expected time:  Means of arrival:  Comments: ems 

## 2014-05-03 NOTE — ED Notes (Signed)
Pt alert and oriented x4. Respirations even and unlabored, bilateral symmetrical rise and fall of chest. Skin warm and dry. In no acute distress. Denies needs.   

## 2014-05-03 NOTE — ED Notes (Signed)
Per EMS- sudden onset of abdominal pain around 1 hour ago. Diagnosed with hernia (above umbilicus) x2 years ago which has given no issue until recently. Having tenderness in RLQ and pain around hernia. Having slight SOB d/t pain. 12 Lead unremarkable. Bradycardia post Fentanyl administration (lowest was 50 bpm). Received 100 mcg Fentanyl. VS: BP 181/104 HR 62.

## 2014-05-03 NOTE — Anesthesia Preprocedure Evaluation (Signed)
Anesthesia Evaluation  Patient identified by MRN, date of birth, ID band Patient awake    Reviewed: Allergy & Precautions, H&P , NPO status , Patient's Chart, lab work & pertinent test results, reviewed documented beta blocker date and time   Airway Mallampati: II  TM Distance: >3 FB Neck ROM: full    Dental  (+) Missing, Dental Advisory Given All front teeth missing upper and lower:   Pulmonary neg pulmonary ROS,  breath sounds clear to auscultation  Pulmonary exam normal       Cardiovascular Exercise Tolerance: Good hypertension, Pt. on home beta blockers and Pt. on medications Rhythm:regular Rate:Normal     Neuro/Psych negative neurological ROS  negative psych ROS   GI/Hepatic negative GI ROS, Neg liver ROS,   Endo/Other  negative endocrine ROS  Renal/GU negative Renal ROS  negative genitourinary   Musculoskeletal   Abdominal   Peds  Hematology negative hematology ROS (+)   Anesthesia Other Findings   Reproductive/Obstetrics negative OB ROS                             Anesthesia Physical Anesthesia Plan  ASA: II  Anesthesia Plan: General   Post-op Pain Management:    Induction: Intravenous, Rapid sequence and Cricoid pressure planned  Airway Management Planned: Oral ETT  Additional Equipment:   Intra-op Plan:   Post-operative Plan: Extubation in OR  Informed Consent: I have reviewed the patients History and Physical, chart, labs and discussed the procedure including the risks, benefits and alternatives for the proposed anesthesia with the patient or authorized representative who has indicated his/her understanding and acceptance.   Dental Advisory Given  Plan Discussed with: CRNA and Surgeon  Anesthesia Plan Comments:         Anesthesia Quick Evaluation

## 2014-05-04 ENCOUNTER — Encounter (HOSPITAL_COMMUNITY): Payer: Self-pay | Admitting: Surgery

## 2014-05-04 DIAGNOSIS — Z6833 Body mass index (BMI) 33.0-33.9, adult: Secondary | ICD-10-CM | POA: Diagnosis not present

## 2014-05-04 DIAGNOSIS — K436 Other and unspecified ventral hernia with obstruction, without gangrene: Secondary | ICD-10-CM | POA: Diagnosis present

## 2014-05-04 DIAGNOSIS — Z886 Allergy status to analgesic agent status: Secondary | ICD-10-CM | POA: Diagnosis not present

## 2014-05-04 DIAGNOSIS — R109 Unspecified abdominal pain: Secondary | ICD-10-CM | POA: Diagnosis present

## 2014-05-04 DIAGNOSIS — Z9889 Other specified postprocedural states: Secondary | ICD-10-CM

## 2014-05-04 DIAGNOSIS — I10 Essential (primary) hypertension: Secondary | ICD-10-CM | POA: Diagnosis present

## 2014-05-04 DIAGNOSIS — Z8719 Personal history of other diseases of the digestive system: Secondary | ICD-10-CM

## 2014-05-04 DIAGNOSIS — K439 Ventral hernia without obstruction or gangrene: Secondary | ICD-10-CM | POA: Diagnosis not present

## 2014-05-04 DIAGNOSIS — Z880 Allergy status to penicillin: Secondary | ICD-10-CM | POA: Diagnosis not present

## 2014-05-04 DIAGNOSIS — Z90710 Acquired absence of both cervix and uterus: Secondary | ICD-10-CM | POA: Diagnosis not present

## 2014-05-04 DIAGNOSIS — Z91018 Allergy to other foods: Secondary | ICD-10-CM | POA: Diagnosis not present

## 2014-05-04 LAB — CBC WITH DIFFERENTIAL/PLATELET
Basophils Absolute: 0 10*3/uL (ref 0.0–0.1)
Basophils Relative: 0 % (ref 0–1)
EOS PCT: 0 % (ref 0–5)
Eosinophils Absolute: 0 10*3/uL (ref 0.0–0.7)
HCT: 40.5 % (ref 36.0–46.0)
Hemoglobin: 13.3 g/dL (ref 12.0–15.0)
LYMPHS PCT: 9 % — AB (ref 12–46)
Lymphs Abs: 0.7 10*3/uL (ref 0.7–4.0)
MCH: 27.3 pg (ref 26.0–34.0)
MCHC: 32.8 g/dL (ref 30.0–36.0)
MCV: 83 fL (ref 78.0–100.0)
MONO ABS: 0.1 10*3/uL (ref 0.1–1.0)
MONOS PCT: 1 % — AB (ref 3–12)
Neutro Abs: 6.7 10*3/uL (ref 1.7–7.7)
Neutrophils Relative %: 90 % — ABNORMAL HIGH (ref 43–77)
PLATELETS: 230 10*3/uL (ref 150–400)
RBC: 4.88 MIL/uL (ref 3.87–5.11)
RDW: 14.6 % (ref 11.5–15.5)
WBC: 7.4 10*3/uL (ref 4.0–10.5)

## 2014-05-04 LAB — BASIC METABOLIC PANEL
Anion gap: 6 (ref 5–15)
BUN: 22 mg/dL (ref 6–23)
CALCIUM: 8.5 mg/dL (ref 8.4–10.5)
CO2: 23 mmol/L (ref 19–32)
CREATININE: 1.18 mg/dL — AB (ref 0.50–1.10)
Chloride: 105 mmol/L (ref 96–112)
GFR calc non Af Amer: 47 mL/min — ABNORMAL LOW (ref >90)
GFR, EST AFRICAN AMERICAN: 55 mL/min — AB (ref >90)
Glucose, Bld: 116 mg/dL — ABNORMAL HIGH (ref 70–99)
Potassium: 4.3 mmol/L (ref 3.5–5.1)
Sodium: 134 mmol/L — ABNORMAL LOW (ref 135–145)

## 2014-05-04 MED ORDER — CARVEDILOL 25 MG PO TABS
25.0000 mg | ORAL_TABLET | Freq: Two times a day (BID) | ORAL | Status: DC
  Administered 2014-05-04 – 2014-05-07 (×7): 25 mg via ORAL
  Filled 2014-05-04 (×10): qty 1

## 2014-05-04 MED ORDER — LIDOCAINE HCL (CARDIAC) 20 MG/ML IV SOLN
INTRAVENOUS | Status: DC | PRN
  Administered 2014-05-03: 100 mg via INTRAVENOUS

## 2014-05-04 MED ORDER — SUCCINYLCHOLINE CHLORIDE 20 MG/ML IJ SOLN
INTRAMUSCULAR | Status: DC | PRN
  Administered 2014-05-03: 100 mg via INTRAVENOUS

## 2014-05-04 MED ORDER — GLYCOPYRROLATE 0.2 MG/ML IJ SOLN
INTRAMUSCULAR | Status: AC
  Filled 2014-05-04: qty 3

## 2014-05-04 MED ORDER — LORATADINE 10 MG PO TABS
10.0000 mg | ORAL_TABLET | Freq: Every day | ORAL | Status: DC | PRN
  Administered 2014-05-04 – 2014-05-05 (×2): 10 mg via ORAL
  Filled 2014-05-04 (×3): qty 1

## 2014-05-04 MED ORDER — PROPOFOL 10 MG/ML IV BOLUS
INTRAVENOUS | Status: DC | PRN
  Administered 2014-05-03: 120 mg via INTRAVENOUS

## 2014-05-04 MED ORDER — CISATRACURIUM BESYLATE (PF) 10 MG/5ML IV SOLN
INTRAVENOUS | Status: DC | PRN
  Administered 2014-05-03: 6 mg via INTRAVENOUS
  Administered 2014-05-04: 2 mg via INTRAVENOUS

## 2014-05-04 MED ORDER — FENTANYL CITRATE (PF) 100 MCG/2ML IJ SOLN
INTRAMUSCULAR | Status: DC | PRN
  Administered 2014-05-03: 150 ug via INTRAVENOUS
  Administered 2014-05-04 (×2): 50 ug via INTRAVENOUS

## 2014-05-04 MED ORDER — HEPARIN SODIUM (PORCINE) 5000 UNIT/ML IJ SOLN
5000.0000 [IU] | Freq: Three times a day (TID) | INTRAMUSCULAR | Status: DC
  Administered 2014-05-04 – 2014-05-07 (×10): 5000 [IU] via SUBCUTANEOUS
  Filled 2014-05-04 (×13): qty 1

## 2014-05-04 MED ORDER — ONDANSETRON HCL 4 MG PO TABS: 4.0000 mg | ORAL_TABLET | Freq: Four times a day (QID) | ORAL | Status: DC | PRN

## 2014-05-04 MED ORDER — BUPIVACAINE-EPINEPHRINE (PF) 0.25% -1:200000 IJ SOLN
INTRAMUSCULAR | Status: DC | PRN
  Administered 2014-05-04: 30 mL

## 2014-05-04 MED ORDER — MORPHINE SULFATE 2 MG/ML IJ SOLN
1.0000 mg | INTRAMUSCULAR | Status: DC | PRN
  Administered 2014-05-04 – 2014-05-05 (×7): 2 mg via INTRAVENOUS
  Filled 2014-05-04 (×8): qty 1

## 2014-05-04 MED ORDER — FUROSEMIDE 40 MG PO TABS
40.0000 mg | ORAL_TABLET | Freq: Every day | ORAL | Status: DC
  Administered 2014-05-04 – 2014-05-07 (×4): 40 mg via ORAL
  Filled 2014-05-04 (×4): qty 1

## 2014-05-04 MED ORDER — VERAPAMIL HCL ER 120 MG PO TBCR
120.0000 mg | EXTENDED_RELEASE_TABLET | Freq: Three times a day (TID) | ORAL | Status: DC
  Filled 2014-05-04 (×3): qty 1

## 2014-05-04 MED ORDER — CETYLPYRIDINIUM CHLORIDE 0.05 % MT LIQD
7.0000 mL | Freq: Two times a day (BID) | OROMUCOSAL | Status: DC
  Administered 2014-05-04 – 2014-05-07 (×7): 7 mL via OROMUCOSAL

## 2014-05-04 MED ORDER — DEXTROSE 5 % IV SOLN
1.0000 g | Freq: Two times a day (BID) | INTRAVENOUS | Status: AC
  Administered 2014-05-04 (×2): 1 g via INTRAVENOUS
  Filled 2014-05-04 (×2): qty 1

## 2014-05-04 MED ORDER — ONDANSETRON HCL 4 MG/2ML IJ SOLN
INTRAMUSCULAR | Status: AC
  Filled 2014-05-04: qty 2

## 2014-05-04 MED ORDER — ONDANSETRON HCL 4 MG/2ML IJ SOLN
INTRAMUSCULAR | Status: DC | PRN
  Administered 2014-05-04: 4 mg via INTRAVENOUS

## 2014-05-04 MED ORDER — NEOSTIGMINE METHYLSULFATE 10 MG/10ML IV SOLN
INTRAVENOUS | Status: AC
  Filled 2014-05-04: qty 1

## 2014-05-04 MED ORDER — MORPHINE SULFATE 10 MG/ML IJ SOLN
INTRAMUSCULAR | Status: DC | PRN
  Administered 2014-05-04 (×4): 1 mg via INTRAVENOUS

## 2014-05-04 MED ORDER — HYDROCODONE-ACETAMINOPHEN 5-325 MG PO TABS
1.0000 | ORAL_TABLET | ORAL | Status: DC | PRN
  Filled 2014-05-04: qty 1

## 2014-05-04 MED ORDER — VERAPAMIL HCL 120 MG PO TABS
60.0000 mg | ORAL_TABLET | Freq: Three times a day (TID) | ORAL | Status: DC
  Administered 2014-05-04 – 2014-05-07 (×10): 60 mg via ORAL
  Filled 2014-05-04 (×12): qty 0.5

## 2014-05-04 MED ORDER — ONDANSETRON HCL 4 MG/2ML IJ SOLN
4.0000 mg | Freq: Four times a day (QID) | INTRAMUSCULAR | Status: DC | PRN
  Administered 2014-05-04: 4 mg via INTRAVENOUS

## 2014-05-04 MED ORDER — LACTATED RINGERS IV SOLN: INTRAVENOUS | Status: DC

## 2014-05-04 MED ORDER — MORPHINE SULFATE 4 MG/ML IJ SOLN
INTRAMUSCULAR | Status: AC
  Filled 2014-05-04: qty 1

## 2014-05-04 MED ORDER — HYDROMORPHONE HCL 1 MG/ML IJ SOLN: 0.2500 mg | INTRAMUSCULAR | Status: DC | PRN

## 2014-05-04 MED ORDER — LACTATED RINGERS IR SOLN
Status: DC | PRN
  Administered 2014-05-04: 1000 mL

## 2014-05-04 MED ORDER — POTASSIUM CHLORIDE IN NACL 20-0.45 MEQ/L-% IV SOLN
INTRAVENOUS | Status: DC
  Administered 2014-05-04 – 2014-05-05 (×4): via INTRAVENOUS
  Filled 2014-05-04 (×5): qty 1000

## 2014-05-04 MED ORDER — GLYCOPYRROLATE 0.2 MG/ML IJ SOLN
INTRAMUSCULAR | Status: DC | PRN
  Administered 2014-05-04: .6 mg via INTRAVENOUS

## 2014-05-04 MED ORDER — NEOSTIGMINE METHYLSULFATE 10 MG/10ML IV SOLN
INTRAVENOUS | Status: DC | PRN
  Administered 2014-05-04: 4 mg via INTRAVENOUS

## 2014-05-04 MED ORDER — DEXAMETHASONE SODIUM PHOSPHATE 10 MG/ML IJ SOLN
INTRAMUSCULAR | Status: DC | PRN
  Administered 2014-05-03: 10 mg via INTRAVENOUS

## 2014-05-04 MED ORDER — VERAPAMIL HCL ER 120 MG PO TBCR: 120.0000 mg | EXTENDED_RELEASE_TABLET | Freq: Three times a day (TID) | ORAL | Status: DC

## 2014-05-04 NOTE — Anesthesia Procedure Notes (Signed)
Procedure Name: Intubation Date/Time: 05/03/2014 11:43 PM Performed by: Danley Danker L Patient Re-evaluated:Patient Re-evaluated prior to inductionOxygen Delivery Method: Circle system utilized Preoxygenation: Pre-oxygenation with 100% oxygen Intubation Type: IV induction, Cricoid Pressure applied and Rapid sequence Laryngoscope Size: Miller and 2 Grade View: Grade I Tube type: Oral Tube size: 7.5 mm Number of attempts: 1 Airway Equipment and Method: Stylet Placement Confirmation: ETT inserted through vocal cords under direct vision,  breath sounds checked- equal and bilateral and positive ETCO2 Secured at: 21 cm Tube secured with: Tape Dental Injury: Teeth and Oropharynx as per pre-operative assessment

## 2014-05-04 NOTE — Op Note (Signed)
NAMERASHAY, BARNETTE NO.:  [id number]  MEDICAL RECORD NO.:  [id number]  LOCATION:  [facility]                         FACILITY:  [facility]  PHYSICIAN:  Fenton Malling. Lucia Gaskins, M.D.  DATE OF BIRTH:  [birth year]  DATE OF PROCEDURE:  05/04/2014                              OPERATIVE REPORT   PREOPERATIVE DIAGNOSIS:  Incarcerated epigastric hernia.  POSTOPERATIVE DIAGNOSIS:  Incarcerated epigastric hernia with small bowel in hernia.  PROCEDURE:  Laparoscopic exploration with open repair of epigastric ventral hernia with sutures only.  SURGEON:  Fenton Malling. Lucia Gaskins, M.D.  FIRST ASSISTANT:  None.  ANESTHESIA:  General endotracheal.  ESTIMATED BLOOD LOSS:  Minimal.  LOCAL MEDICATION:  30 mL of 0.25% Marcaine with epinephrine.  SPECIMEN:  Hernia sac.  COMPLICATIONS:  None.  INDICATION FOR PROCEDURE:  Ms. Polyakov is a 64 year old African American female, whose primary care physician is Dr. Angelica Chessman of [facility].  She has had abdominal wall hernia for years, but has never had any surgical care of this.  She presented acutely this afternoon to the [facility] with an incarcerated abdominal wall hernia.  CT scan shows evidence of a small bowel going into the hernia and she now comes for laparoscopic exploration and repair of this hernia.  OPERATIVE NOTE:  She was given one 2 g of cefotetan initial to procedure, underwent general anesthesia in room 1 at [facility].  Her abdomen was prepped with ChloraPrep and sterilely draped with Ioban.  A time-out was held and surgical checklist run.  I accessed the abdominal cavity with a 5 mm trocar in the left upper quadrant.  I placed a second 5 mm trocar in the mid left abdomen.  Her liver, stomach, and bowel that I could see were unremarkable.  She did have a single loop of small bowel in the epigastric hernia.  She has had a previous lower midline incision for a hysterectomy and there were  no adhesions to this scar.  There was a single loop of small bowel incarcerated in the epigastric hernia defect.  The epigastrium hernia was about 4 or 5 cm above the umbilicus.  I tried to reduce this laparoscopically, but I was unable to.  So, I made an incision directly over the hernia cut down to the fascia. She had about a 3-4 cm fascial defect.  I incised about 2 additional cm for a total of about 6 cm fascial defect.  I was then able to reduce the hernia.  I then laparoscopically looked at the small bowel.  At first, it was hemorrhagic and worried about being ischemic.  However, I waited some 15 minutes and washed the bowel, it pinked up with no obvious infarcted area.  The small bowel was beat up, but I thought viable and worth trying to save.  I took pictures of the initial impression of the bowel and then after about 15 minutes of watching it.  I thought it was worthwhile trying to not do anything further to the bowel.  She is at some risk of later perforation, but I think that this is low, and will explain this to the patient and family.  I  then closed her abdominal wall with interrupted #0 Novafil sutures.  This defect was about 5-6 cm in length.  I then irrigated the subcu tissues with an L of saline, closed subcutaneous tissues with 3-0 Vicryl suture, the skin with a 4-0 Monocryl suture and the 2 trocar  sites in the left upper quadrant with Monocryl suture.  I infiltrated about 30 mL of 0.25% Marcaine into both the midline fascia into the small trocar sites on the left side.  She tolerated the procedure well.  Sponge and needle count was correct at the end of the case.  Again, it remains to be seen if his bowel will survive, I think there is a good chance it will survive, and right now we will keep her n.p.o. on IV fluids, give her at least 1 days course of antibiotics and follow her from there.   Fenton Malling. Lucia Gaskins, M.D., [facility], scribe for Epic    DHN/MEDQ  D:  05/04/2014  T:  05/04/2014   Job:  [id number]  cc:   Angelica Chessman, M.D.

## 2014-05-04 NOTE — Progress Notes (Signed)
[name], female   DOB: [birth year], 64 y.o.   MRN: [id number]     [address] [place], [place] [address]    Phone: [phone] FAX: [phone]     Subjective: No pain.  No vomiting.  Nausea around 4AM.  No flatus.  Voiding.  Ambulating in hallways.   Objective:  Vital signs:  Filed Vitals:   05/04/14 0232 05/04/14 0332 05/04/14 0432 05/04/14 0532  BP: 161/82 143/66 136/77 147/75  Pulse: 62 71 62 67  Temp: 97.6 F (36.4 C) 97.6 F (36.4 C) 97.7 F (36.5 C) 97.9 F (36.6 C)  TempSrc:  Oral Oral Oral  Resp: _0 Height:      Weight:      SpO2: 100% 100% 98% 100%       Intake/Output   Yesterday:  04/24 0701 - 04/25 0700 In: 1945.8 [I.V.:1945.8] Out: 350 [Urine:300; Blood:50] This shift:    I/O last 3 completed shifts: In: 1945.8 [I.V.:1945.8] Out: 350 [Urine:300; Blood:50]    Physical Exam: General: Pt awake/alert/oriented x4 in no acute distress Chest: cta.  No chest wall pain w good excursion CV:  Pulses intact.  Regular rhythm Abdomen: Soft.  Nondistended. Non tender.  Midline incision-dermabond, no erythema, or drainage.  No evidence of peritonitis.  No incarcerated hernias. Ext:  SCDs BLE.  No mjr edema.  No cyanosis Skin: No petechiae / purpura   Problem List:   Active Problems:   Incarcerated epigastric hernia    Results:   Labs: Results for orders placed or performed during the hospital encounter of 05/03/14 (from the past 48 hour(s))  Urinalysis, Routine w reflex microscopic     Status: Abnormal   Collection Time: 05/03/14  6:58 PM  Result Value Ref Range   Color, Urine YELLOW YELLOW   APPearance CLEAR CLEAR   Specific Gravity, Urine 1.017 1.005 - 1.030   pH 7.0 5.0 - 8.0   Glucose, UA NEGATIVE NEGATIVE mg/dL   Hgb urine dipstick NEGATIVE NEGATIVE   Bilirubin Urine NEGATIVE NEGATIVE   Ketones, ur 15 (A) NEGATIVE mg/dL   Protein, ur NEGATIVE  NEGATIVE mg/dL   Urobilinogen, UA 1.0 0.0 - 1.0 mg/dL   Nitrite NEGATIVE NEGATIVE   Leukocytes, UA NEGATIVE NEGATIVE    Comment: MICROSCOPIC NOT DONE ON URINES WITH NEGATIVE PROTEIN, BLOOD, LEUKOCYTES, NITRITE, OR GLUCOSE <1000 mg/dL.  CBC with Differential     Status: Abnormal   Collection Time: 05/03/14  7:50 PM  Result Value Ref Range   WBC 5.8 4.0 - 10.5 K/uL   RBC 5.23 (H) 3.87 - 5.11 MIL/uL   Hemoglobin 14.1 12.0 - 15.0 g/dL   HCT 43.2 36.0 - 46.0 %   MCV 82.6 78.0 - 100.0 fL   MCH 27.0 26.0 - 34.0 pg   MCHC 32.6 30.0 - 36.0 g/dL   RDW 14.3 11.5 - 15.5 %   Platelets 243 150 - 400 K/uL   Neutrophils Relative % 74 43 - 77 %   Neutro Abs 4.2 1.7 - 7.7 K/uL   Lymphocytes Relative 20 12 - 46 %   Lymphs Abs 1.2 0.7 - 4.0 K/uL   Monocytes Relative 6 3 - 12 %   Monocytes Absolute 0.3 0.1 - 1.0 K/uL   Eosinophils Relative 0 0 - 5 %   Eosinophils Absolute 0.0 0.0 - 0.7 K/uL   Basophils Relative 0 0 - 1 %  Basophils Absolute 0.0 0.0 - 0.1 K/uL  Comprehensive metabolic panel     Status: Abnormal   Collection Time: 05/03/14  7:50 PM  Result Value Ref Range   Sodium 139 135 - 145 mmol/L   Potassium 4.0 3.5 - 5.1 mmol/L   Chloride 103 96 - 112 mmol/L   CO2 27 19 - 32 mmol/L   Glucose, Bld 95 70 - 99 mg/dL   BUN 20 6 - 23 mg/dL   Creatinine, Ser 1.28 (H) 0.50 - 1.10 mg/dL   Calcium 9.6 8.4 - 10.5 mg/dL   Total Protein 8.5 (H) 6.0 - 8.3 g/dL   Albumin 4.4 3.5 - 5.2 g/dL   AST 22 0 - 37 U/L   ALT 23 0 - 35 U/L   Alkaline Phosphatase 61 39 - 117 U/L   Total Bilirubin 0.6 0.3 - 1.2 mg/dL   GFR calc non Af Amer 43 (L) >90 mL/min   GFR calc Af Amer 50 (L) >90 mL/min    Comment: (NOTE) The eGFR has been calculated using the CKD EPI equation. This calculation has not been validated in all clinical situations. eGFR's persistently <90 mL/min signify possible Chronic Kidney Disease.    Anion gap 9 5 - 15  Lipase, blood     Status: None   Collection Time: 05/03/14  7:50 PM  Result  Value Ref Range   Lipase 23 11 - 59 U/L    Imaging / Studies: Ct Abdomen Pelvis Wo Contrast  05/03/2014   CLINICAL DATA:  Acute onset of umbilical hernia pain. Evaluate for incarceration. Initial encounter.  EXAM: CT ABDOMEN AND PELVIS WITHOUT CONTRAST  TECHNIQUE: Multidetector CT imaging of the abdomen and pelvis was performed following the standard protocol without IV contrast.  COMPARISON:  None.  FINDINGS: Minimal right basilar atelectasis is noted.  There is incarceration of a short segment of proximal ileum at the level of a midline anterior abdominal wall hernia just superior to the umbilicus. The hernia is moderate in size, with mild fluid noted about the herniated segment. The herniated segment is distended to 4.2 x 4.1 x 2.8 cm. There is decompression of more distal small bowel loops, though no significant dilatation of more proximal loops, likely reflecting partial small bowel obstruction. Given the dilatation of the herniated segment and a relatively narrow hernia neck, this will not resolve on its own.  The liver and spleen are unremarkable in appearance. The gallbladder is within normal limits. The pancreas and adrenal glands are unremarkable.  The kidneys are unremarkable in appearance. There is no evidence of hydronephrosis. No renal or ureteral stones are seen. No perinephric stranding is appreciated.  No free fluid is identified. The small bowel is unremarkable in appearance. The stomach is within normal limits. No acute vascular abnormalities are seen.  The appendix is not definitely characterized; there is no evidence of appendicitis. Mild apparent wall thickening along the transverse colon is thought reflect relative decompression. Mild diverticulosis is noted along the distal descending colon. The colon is otherwise unremarkable in appearance.  The bladder is mildly distended and grossly unremarkable. The patient is status post hysterectomy. No suspicious adnexal masses are seen. No  inguinal lymphadenopathy is seen.  No acute osseous abnormalities are identified. Mild facet disease is noted at the lower lumbar spine.  IMPRESSION: 1. Incarceration of a short segment of proximal ileum at the level of a midline anterior abdominal wall hernia, just superior to the umbilicus. The hernia is moderate in size, with mild fluid  about the herniated segment of small bowel. Distention of the herniated segment to 4.2 x 4.1 x 2.8 cm, with decompression of more distal small bowel loops, likely reflecting partial small bowel obstruction. Given dilatation of the herniated segment and a relatively narrow hernia neck, this will not resolve on its own. 2. Mild diverticulosis along the distal descending colon, without evidence of diverticulitis.  These results were called by telephone at the time of interpretation on 05/03/2014 at 9:41 pm to [facility] PA, who verbally acknowledged these results.   Electronically Signed   By: Garald Balding M.D.   On: 05/03/2014 21:42    Medications / Allergies:  Scheduled Meds: . antiseptic oral rinse  7 mL Mouth Rinse BID  . carvedilol  25 mg Oral BID WC  . cefoTEtan (CEFOTAN) IV  1 g Intravenous Q12H  . furosemide  40 mg Oral Daily  . heparin  5,000 Units Subcutaneous 3 times per day  . ondansetron      . verapamil  120 mg Oral TID   Continuous Infusions: . 0.45 % NaCl with KCl 20 mEq / L 125 mL/hr at 05/04/14 0250   PRN Meds:.HYDROcodone-acetaminophen, loratadine, morphine injection, ondansetron **OR** ondansetron (ZOFRAN) IV  Antibiotics: Anti-infectives    Start     Dose/Rate Route Frequency Ordered Stop   05/04/14 1000  cefoTEtan (CEFOTAN) 1 g in dextrose 5 % 50 mL IVPB     1 g 100 mL/hr over 30 Minutes Intravenous Every 12 hours 05/04/14 0225 05/05/14 0959   05/04/14 0000  cefoTEtan (CEFOTAN) 2 g in dextrose 5 % 50 mL IVPB     2 g 100 mL/hr over 30 Minutes Intravenous  Once 05/03/14 2359 05/03/14 2355         Assessment/Plan Incarcerated epigastric hernia POD#0 Laparoscopic exploration with open repair of hernia with sutures only---Dr. Lucia Gaskins  -ice chips only for now -mobilize -IVF ID-cefotetan D#1 for incarcerated hernia, monitor for ischemic bowel.  Will discuss duration the duration of therapy. VTE prophylaxis-SCD/heparin FEN-NPO x ice chips, will consider clears soon.  IV pain meds, will transition to PO when able to tolerate HTN-resume verapamil, reduce IVF.  If BP remains high, will resume aldactone    Erby Pian, ANP-BC [facility] Pager [phone](7A-4:30P)   05/04/2014 8:59 AM

## 2014-05-04 NOTE — Anesthesia Postprocedure Evaluation (Signed)
  Anesthesia Post-op Note  Patient: Michelle Hurley  Procedure(s) Performed: Procedure(s) (LRB): LAPAROSCOPIC EXPLORATION WITH OPEN REPAIR OF INCARCERATED EPIGASTRIC HERNIA  (N/A)  Patient Location: PACU  Anesthesia Type: General  Level of Consciousness: awake and alert   Airway and Oxygen Therapy: Patient Spontanous Breathing  Post-op Pain: mild  Post-op Assessment: Post-op Vital signs reviewed, Patient's Cardiovascular Status Stable, Respiratory Function Stable, Patent Airway and No signs of Nausea or vomiting  Last Vitals:  Filed Vitals:   05/04/14 0200  BP: 151/92  Pulse: 68  Temp: 36.4 C  Resp: 18    Post-op Vital Signs: stable   Complications: No apparent anesthesia complications

## 2014-05-04 NOTE — Care Management Note (Signed)
    Page 1 of 1   05/04/2014     11:00:50 AM CARE MANAGEMENT NOTE 05/04/2014  Patient:  Michelle Hurley, Michelle Hurley   Account Number:  [id number]  Date Initiated:  05/04/2014  Documentation initiated by:  Sunday Spillers  Subjective/Objective Assessment:   64 yo female admitted s/p Laparoscopic exploration with open repair of epigastric  ventral hernia. PTA lived at home alone.     Action/Plan:   Discharge planning   Anticipated DC Date:  05/04/2014   Anticipated DC Plan:  [place]  CM consult      Choice offered to / List presented to:             Status of service:  Completed, signed off Medicare Important Message given?   (If response is "NO", the following Medicare IM given date fields will be blank) Date Medicare IM given:   Medicare IM given by:   Date Additional Medicare IM given:   Additional Medicare IM given by:    Discharge Disposition:  HOME/SELF CARE  Per UR Regulation:  Reviewed for med. necessity/level of care/duration of stay  If discussed at [place] of Stay Meetings, dates discussed:    Comments:

## 2014-05-04 NOTE — Transfer of Care (Signed)
Immediate Anesthesia Transfer of Care Note  Patient: Michelle Hurley  Procedure(s) Performed: Procedure(s): LAPAROSCOPIC EXPLORATION WITH OPEN REPAIR OF INCARCERATED EPIGASTRIC HERNIA  (N/A)  Patient Location: PACU  Anesthesia Type:General  Level of Consciousness: awake  Airway & Oxygen Therapy: Patient Spontanous Breathing and Patient connected to face mask oxygen  Post-op Assessment: Report given to RN and Post -op Vital signs reviewed and stable  Post vital signs: Reviewed and stable  Last Vitals:  Filed Vitals:   05/03/14 2059  BP: 135/88  Pulse: 69  Temp: 36.4 C  Resp: 21    Complications: No apparent anesthesia complications

## 2014-05-05 LAB — URINE CULTURE: Colony Count: 15000

## 2014-05-05 MED ORDER — ALUM & MAG HYDROXIDE-SIMETH 200-200-20 MG/5ML PO SUSP: 30.0000 mL | Freq: Four times a day (QID) | ORAL | Status: DC | PRN

## 2014-05-05 MED ORDER — TRAMADOL HCL 50 MG PO TABS
50.0000 mg | ORAL_TABLET | Freq: Four times a day (QID) | ORAL | Status: DC | PRN
  Administered 2014-05-05 – 2014-05-07 (×3): 50 mg via ORAL
  Filled 2014-05-05 (×3): qty 1

## 2014-05-05 NOTE — Progress Notes (Signed)
Patient ID: Caramia Boutin, female   DOB: [birth year], 64 y.o.   MRN: [id number]     [address] [place], [place] [address]    Phone: [phone] FAX: [phone]     Subjective: More pain today.  VSS.  Afebrile.  No flatus.  Denies n/v.  Ambulated x3 yesterday.  Denies dysuria.    Objective:  Vital signs:  Filed Vitals:   05/04/14 1400 05/04/14 1800 05/04/14 2200 05/05/14 0544  BP: 136/77 127/74 130/78 126/71  Pulse: 70 64 71 59  Temp: 98.5 F (36.9 C) 97.8 F (36.6 C) 98.1 F (36.7 C) 98.3 F (36.8 C)  TempSrc: Oral Oral Oral Oral  Resp: '18 18 18 16  ' Height:      Weight:      SpO2: 98% 100% 100% 99%       Intake/Output   Yesterday:  04/25 0701 - 04/26 0700 In: 2211.7 [P.O.:240; I.V.:1971.7] Out: 3050 [Urine:3050] This shift: I/O last 3 completed shifts: In: 4157.5 [P.O.:240; I.V.:3917.5] Out: 3400 [Urine:3350; Blood:50]    Physical Exam: General: Pt awake/alert/oriented x4 in no acute distress Chest: cta. No chest wall pain w good excursion CV: Pulses intact. Regular rhythm Abdomen: Soft. Nondistended. Tender to right abdomen. Midline incision-dermabond, no erythema, or drainage. No evidence of peritonitis. No incarcerated hernias. Ext: SCDs BLE. No mjr edema. No cyanosis Skin: No petechiae / purpura   Problem List:   Active Problems:   Incarcerated epigastric hernia    Results:   Labs: Results for orders placed or performed during the hospital encounter of 05/03/14 (from the past 48 hour(s))  Urine culture     Status: None   Collection Time: 05/03/14  6:57 PM  Result Value Ref Range   Specimen Description URINE, CLEAN CATCH    Special Requests NONE    Colony Count      15,000 COLONIES/ML Performed at [facility]    Culture      Multiple bacterial morphotypes present, none predominant. Suggest appropriate recollection if clinically indicated. Performed at  [facility]    Report Status 05/05/2014 FINAL   Urinalysis, Routine w reflex microscopic     Status: Abnormal   Collection Time: 05/03/14  6:58 PM  Result Value Ref Range   Color, Urine YELLOW YELLOW   APPearance CLEAR CLEAR   Specific Gravity, Urine 1.017 1.005 - 1.030   pH 7.0 5.0 - 8.0   Glucose, UA NEGATIVE NEGATIVE mg/dL   Hgb urine dipstick NEGATIVE NEGATIVE   Bilirubin Urine NEGATIVE NEGATIVE   Ketones, ur 15 (A) NEGATIVE mg/dL   Protein, ur NEGATIVE NEGATIVE mg/dL   Urobilinogen, UA 1.0 0.0 - 1.0 mg/dL   Nitrite NEGATIVE NEGATIVE   Leukocytes, UA NEGATIVE NEGATIVE    Comment: MICROSCOPIC NOT DONE ON URINES WITH NEGATIVE PROTEIN, BLOOD, LEUKOCYTES, NITRITE, OR GLUCOSE <1000 mg/dL.  CBC with Differential     Status: Abnormal   Collection Time: 05/03/14  7:50 PM  Result Value Ref Range   WBC 5.8 4.0 - 10.5 K/uL   RBC 5.23 (H) 3.87 - 5.11 MIL/uL   Hemoglobin 14.1 12.0 - 15.0 g/dL   HCT 43.2 36.0 - 46.0 %   MCV 82.6 78.0 - 100.0 fL   MCH 27.0 26.0 - 34.0 pg   MCHC 32.6 30.0 - 36.0 g/dL   RDW 14.3 11.5 - 15.5 %   Platelets 243 150 - 400 K/uL   Neutrophils Relative %  74 43 - 77 %   Neutro Abs 4.2 1.7 - 7.7 K/uL   Lymphocytes Relative 20 12 - 46 %   Lymphs Abs 1.2 0.7 - 4.0 K/uL   Monocytes Relative 6 3 - 12 %   Monocytes Absolute 0.3 0.1 - 1.0 K/uL   Eosinophils Relative 0 0 - 5 %   Eosinophils Absolute 0.0 0.0 - 0.7 K/uL   Basophils Relative 0 0 - 1 %   Basophils Absolute 0.0 0.0 - 0.1 K/uL  Comprehensive metabolic panel     Status: Abnormal   Collection Time: 05/03/14  7:50 PM  Result Value Ref Range   Sodium 139 135 - 145 mmol/L   Potassium 4.0 3.5 - 5.1 mmol/L   Chloride 103 96 - 112 mmol/L   CO2 27 19 - 32 mmol/L   Glucose, Bld 95 70 - 99 mg/dL   BUN 20 6 - 23 mg/dL   Creatinine, Ser 1.28 (H) 0.50 - 1.10 mg/dL   Calcium 9.6 8.4 - 10.5 mg/dL   Total Protein 8.5 (H) 6.0 - 8.3 g/dL   Albumin 4.4 3.5 - 5.2 g/dL   AST 22 0 - 37 U/L   ALT 23 0 - 35 U/L    Alkaline Phosphatase 61 39 - 117 U/L   Total Bilirubin 0.6 0.3 - 1.2 mg/dL   GFR calc non Af Amer 43 (L) >90 mL/min   GFR calc Af Amer 50 (L) >90 mL/min    Comment: (NOTE) The eGFR has been calculated using the CKD EPI equation. This calculation has not been validated in all clinical situations. eGFR's persistently <90 mL/min signify possible Chronic Kidney Disease.    Anion gap 9 5 - 15  Lipase, blood     Status: None   Collection Time: 05/03/14  7:50 PM  Result Value Ref Range   Lipase 23 11 - 59 U/L  Basic metabolic panel     Status: Abnormal   Collection Time: 05/04/14  9:12 AM  Result Value Ref Range   Sodium 134 (L) 135 - 145 mmol/L   Potassium 4.3 3.5 - 5.1 mmol/L   Chloride 105 96 - 112 mmol/L   CO2 23 19 - 32 mmol/L   Glucose, Bld 116 (H) 70 - 99 mg/dL   BUN 22 6 - 23 mg/dL   Creatinine, Ser 1.18 (H) 0.50 - 1.10 mg/dL   Calcium 8.5 8.4 - 10.5 mg/dL   GFR calc non Af Amer 47 (L) >90 mL/min   GFR calc Af Amer 55 (L) >90 mL/min    Comment: (NOTE) The eGFR has been calculated using the CKD EPI equation. This calculation has not been validated in all clinical situations. eGFR's persistently <90 mL/min signify possible Chronic Kidney Disease.    Anion gap 6 5 - 15  CBC with Differential/Platelet     Status: Abnormal   Collection Time: 05/04/14  9:12 AM  Result Value Ref Range   WBC 7.4 4.0 - 10.5 K/uL   RBC 4.88 3.87 - 5.11 MIL/uL   Hemoglobin 13.3 12.0 - 15.0 g/dL   HCT 40.5 36.0 - 46.0 %   MCV 83.0 78.0 - 100.0 fL   MCH 27.3 26.0 - 34.0 pg   MCHC 32.8 30.0 - 36.0 g/dL   RDW 14.6 11.5 - 15.5 %   Platelets 230 150 - 400 K/uL   Neutrophils Relative % 90 (H) 43 - 77 %   Neutro Abs 6.7 1.7 - 7.7 K/uL   Lymphocytes Relative 9 (L)  12 - 46 %   Lymphs Abs 0.7 0.7 - 4.0 K/uL   Monocytes Relative 1 (L) 3 - 12 %   Monocytes Absolute 0.1 0.1 - 1.0 K/uL   Eosinophils Relative 0 0 - 5 %   Eosinophils Absolute 0.0 0.0 - 0.7 K/uL   Basophils Relative 0 0 - 1 %    Basophils Absolute 0.0 0.0 - 0.1 K/uL    Imaging / Studies: Ct Abdomen Pelvis Wo Contrast  05/03/2014   CLINICAL DATA:  Acute onset of umbilical hernia pain. Evaluate for incarceration. Initial encounter.  EXAM: CT ABDOMEN AND PELVIS WITHOUT CONTRAST  TECHNIQUE: Multidetector CT imaging of the abdomen and pelvis was performed following the standard protocol without IV contrast.  COMPARISON:  None.  FINDINGS: Minimal right basilar atelectasis is noted.  There is incarceration of a short segment of proximal ileum at the level of a midline anterior abdominal wall hernia just superior to the umbilicus. The hernia is moderate in size, with mild fluid noted about the herniated segment. The herniated segment is distended to 4.2 x 4.1 x 2.8 cm. There is decompression of more distal small bowel loops, though no significant dilatation of more proximal loops, likely reflecting partial small bowel obstruction. Given the dilatation of the herniated segment and a relatively narrow hernia neck, this will not resolve on its own.  The liver and spleen are unremarkable in appearance. The gallbladder is within normal limits. The pancreas and adrenal glands are unremarkable.  The kidneys are unremarkable in appearance. There is no evidence of hydronephrosis. No renal or ureteral stones are seen. No perinephric stranding is appreciated.  No free fluid is identified. The small bowel is unremarkable in appearance. The stomach is within normal limits. No acute vascular abnormalities are seen.  The appendix is not definitely characterized; there is no evidence of appendicitis. Mild apparent wall thickening along the transverse colon is thought reflect relative decompression. Mild diverticulosis is noted along the distal descending colon. The colon is otherwise unremarkable in appearance.  The bladder is mildly distended and grossly unremarkable. The patient is status post hysterectomy. No suspicious adnexal masses are seen. No inguinal  lymphadenopathy is seen.  No acute osseous abnormalities are identified. Mild facet disease is noted at the lower lumbar spine.  IMPRESSION: 1. Incarceration of a short segment of proximal ileum at the level of a midline anterior abdominal wall hernia, just superior to the umbilicus. The hernia is moderate in size, with mild fluid about the herniated segment of small bowel. Distention of the herniated segment to 4.2 x 4.1 x 2.8 cm, with decompression of more distal small bowel loops, likely reflecting partial small bowel obstruction. Given dilatation of the herniated segment and a relatively narrow hernia neck, this will not resolve on its own. 2. Mild diverticulosis along the distal descending colon, without evidence of diverticulitis.  These results were called by telephone at the time of interpretation on 05/03/2014 at 9:41 pm to [facility] PA, who verbally acknowledged these results.   Electronically Signed   By: Garald Balding M.D.   On: 05/03/2014 21:42    Medications / Allergies:  Scheduled Meds: . antiseptic oral rinse  7 mL Mouth Rinse BID  . carvedilol  25 mg Oral BID WC  . furosemide  40 mg Oral Daily  . heparin  5,000 Units Subcutaneous 3 times per day  . verapamil  60 mg Oral TID   Continuous Infusions: . 0.45 % NaCl with KCl 20 mEq / L  75 mL/hr at 05/05/14 0147   PRN Meds:.HYDROcodone-acetaminophen, loratadine, morphine injection, ondansetron **OR** ondansetron (ZOFRAN) IV  Antibiotics: Anti-infectives    Start     Dose/Rate Route Frequency Ordered Stop   05/04/14 1000  cefoTEtan (CEFOTAN) 1 g in dextrose 5 % 50 mL IVPB     1 g 100 mL/hr over 30 Minutes Intravenous Every 12 hours 05/04/14 0225 05/04/14 2225   05/04/14 0000  cefoTEtan (CEFOTAN) 2 g in dextrose 5 % 50 mL IVPB     2 g 100 mL/hr over 30 Minutes Intravenous  Once 05/03/14 2359 05/03/14 2355       Assessment/Plan Incarcerated epigastric hernia POD#2 Laparoscopic exploration with open repair of hernia  with sutures only---Dr. Lucia Gaskins  -clear liquid diet -mobilize -IVF -IS(1543m) VTE prophylaxis-SCD/heparin FEN- IV pain meds, will transition to PO when able to tolerate HTN-stable  EErby Pian ANP-BC [facility] Pager [phone](7A-4:30P)   05/05/2014 8:04 AM

## 2014-05-06 LAB — BASIC METABOLIC PANEL
ANION GAP: 7 (ref 5–15)
BUN: 23 mg/dL (ref 6–23)
CALCIUM: 8.8 mg/dL (ref 8.4–10.5)
CO2: 25 mmol/L (ref 19–32)
Chloride: 102 mmol/L (ref 96–112)
Creatinine, Ser: 1.08 mg/dL (ref 0.50–1.10)
GFR calc non Af Amer: 53 mL/min — ABNORMAL LOW (ref >90)
GFR, EST AFRICAN AMERICAN: 61 mL/min — AB (ref >90)
GLUCOSE: 93 mg/dL (ref 70–99)
Potassium: 4 mmol/L (ref 3.5–5.1)
SODIUM: 134 mmol/L — AB (ref 135–145)

## 2014-05-06 MED ORDER — PRAVASTATIN SODIUM 20 MG PO TABS
20.0000 mg | ORAL_TABLET | Freq: Every day | ORAL | Status: DC
  Administered 2014-05-06: 20 mg via ORAL
  Filled 2014-05-06 (×3): qty 1

## 2014-05-06 NOTE — Progress Notes (Signed)
[name], female   DOB: [birth year], 64 y.o.   MRN: [id number]     [address] [place], [place] [address]    Phone: [phone] FAX: [phone]     Subjective: No flatus.  Tolerating clears. Denies n/v.   Ambulating.  Slept great last night, no pain.  Tolerated tramadol.  VSS.  Afebrile. Labs reviewed, stable.    Objective:  Vital signs:  Filed Vitals:   05/05/14 0544 05/05/14 1413 05/05/14 2130 05/06/14 0600  BP: 126/71 111/65 133/69 118/81  Pulse: 59 72 55 64  Temp: 98.3 F (36.8 C) 97.9 F (36.6 C) 98.6 F (37 C) 98 F (36.7 C)  TempSrc: Oral Oral Oral Oral  Resp: '16 16 18 18  ' Height:      Weight:      SpO2: 99% 100% 100% 100%       Intake/Output   Yesterday:  04/26 0701 - 04/27 0700 In: 2040 [P.O.:240; I.V.:1800] Out: 2125 [Urine:2125] This shift:    I/O last 3 completed shifts: In: 2940 [P.O.:240; I.V.:2700] Out: 9528 [Urine:4375]    Physical Exam: General: Pt awake/alert/oriented x4 in no acute distress Chest: cta. No chest wall pain w good excursion CV: Pulses intact. Regular rhythm Abdomen: Soft. Nondistended. Tender to right abdomen. Midline incision-dermabond, no erythema, or drainage. No evidence of peritonitis. No incarcerated hernias. Ext: SCDs BLE. No mjr edema. No cyanosis Skin: No petechiae / purpura   Problem List:   Active Problems:   Incarcerated epigastric hernia   S/P hernia repair   Essential hypertension    Results:   Labs: Results for orders placed or performed during the hospital encounter of 05/03/14 (from the past 48 hour(s))  Basic metabolic panel     Status: Abnormal   Collection Time: 05/04/14  9:12 AM  Result Value Ref Range   Sodium 134 (L) 135 - 145 mmol/L   Potassium 4.3 3.5 - 5.1 mmol/L   Chloride 105 96 - 112 mmol/L   CO2 23 19 - 32 mmol/L   Glucose, Bld 116 (H) 70 - 99 mg/dL   BUN 22 6 - 23 mg/dL   Creatinine,  Ser 1.18 (H) 0.50 - 1.10 mg/dL   Calcium 8.5 8.4 - 10.5 mg/dL   GFR calc non Af Amer 47 (L) >90 mL/min   GFR calc Af Amer 55 (L) >90 mL/min    Comment: (NOTE) The eGFR has been calculated using the CKD EPI equation. This calculation has not been validated in all clinical situations. eGFR's persistently <90 mL/min signify possible Chronic Kidney Disease.    Anion gap 6 5 - 15  CBC with Differential/Platelet     Status: Abnormal   Collection Time: 05/04/14  9:12 AM  Result Value Ref Range   WBC 7.4 4.0 - 10.5 K/uL   RBC 4.88 3.87 - 5.11 MIL/uL   Hemoglobin 13.3 12.0 - 15.0 g/dL   HCT 40.5 36.0 - 46.0 %   MCV 83.0 78.0 - 100.0 fL   MCH 27.3 26.0 - 34.0 pg   MCHC 32.8 30.0 - 36.0 g/dL   RDW 14.6 11.5 - 15.5 %   Platelets 230 150 - 400 K/uL   Neutrophils Relative % 90 (H) 43 - 77 %   Neutro Abs 6.7 1.7 - 7.7 K/uL   Lymphocytes Relative 9 (L) 12 - 46 %   Lymphs Abs 0.7 0.7 - 4.0 K/uL   Monocytes Relative 1 (  L) 3 - 12 %   Monocytes Absolute 0.1 0.1 - 1.0 K/uL   Eosinophils Relative 0 0 - 5 %   Eosinophils Absolute 0.0 0.0 - 0.7 K/uL   Basophils Relative 0 0 - 1 %   Basophils Absolute 0.0 0.0 - 0.1 K/uL  Basic metabolic panel     Status: Abnormal   Collection Time: 05/06/14  5:44 AM  Result Value Ref Range   Sodium 134 (L) 135 - 145 mmol/L   Potassium 4.0 3.5 - 5.1 mmol/L   Chloride 102 96 - 112 mmol/L   CO2 25 19 - 32 mmol/L   Glucose, Bld 93 70 - 99 mg/dL   BUN 23 6 - 23 mg/dL   Creatinine, Ser 1.08 0.50 - 1.10 mg/dL   Calcium 8.8 8.4 - 10.5 mg/dL   GFR calc non Af Amer 53 (L) >90 mL/min   GFR calc Af Amer 61 (L) >90 mL/min    Comment: (NOTE) The eGFR has been calculated using the CKD EPI equation. This calculation has not been validated in all clinical situations. eGFR's persistently <90 mL/min signify possible Chronic Kidney Disease.    Anion gap 7 5 - 15    Imaging / Studies: No results found.  Medications / Allergies:  Scheduled Meds: . antiseptic oral rinse   7 mL Mouth Rinse BID  . carvedilol  25 mg Oral BID WC  . furosemide  40 mg Oral Daily  . heparin  5,000 Units Subcutaneous 3 times per day  . verapamil  60 mg Oral TID   Continuous Infusions: . 0.45 % NaCl with KCl 20 mEq / L 75 mL/hr at 05/05/14 1436   PRN Meds:.alum & mag hydroxide-simeth, loratadine, morphine injection, ondansetron **OR** ondansetron (ZOFRAN) IV, traMADol  Antibiotics: Anti-infectives    Start     Dose/Rate Route Frequency Ordered Stop   05/04/14 1000  cefoTEtan (CEFOTAN) 1 g in dextrose 5 % 50 mL IVPB     1 g 100 mL/hr over 30 Minutes Intravenous Every 12 hours 05/04/14 0225 05/04/14 2225   05/04/14 0000  cefoTEtan (CEFOTAN) 2 g in dextrose 5 % 50 mL IVPB     2 g 100 mL/hr over 30 Minutes Intravenous  Once 05/03/14 2359 05/03/14 2355        Assessment/Plan Incarcerated epigastric hernia POD#3 Laparoscopic exploration with open repair of hernia with sutures only---Dr. Lucia Gaskins  -awaiting bowel function -mobilize -DC IVF -IS(1564m) VTE prophylaxis-SCD/heparin FEN-advance to fulls.  Tramadol for pain, IV for brekthrough HTN-stable  EErby Pian ANP-BC [facility] Pager [phone](7A-4:30P)   05/06/2014 8:19 AM

## 2014-05-07 MED ORDER — TRAMADOL HCL 50 MG PO TABS: 50.0000 mg | ORAL_TABLET | Freq: Four times a day (QID) | ORAL | Status: DC | PRN

## 2014-05-07 NOTE — Discharge Summary (Signed)
Physician Discharge Summary  Michelle Hurley ZOX:[id number] DOB: 09-21-1950 DOA: 05/03/2014  PCP: Jeanann Lewandowsky, MD  Consultation:none  Admit date: 05/03/2014 Discharge date: 05/07/2014  Recommendations for Outpatient Follow-up:   Follow-up Information    Follow up with [facility] H, MD On 05/27/2014.   Specialty:  General Surgery   Why:  arrive by 9:45AM for a 10:15AM post operative check up   Contact information:   [address] [place] [address] [phone]      Discharge Diagnoses:  1. Incarcerated epigastric hernia 2. S/p laparoscopic exploration with open hernia repair with sutures only 3. hypertension   Surgical Procedure: Laparoscopic exploration with open repair of hernia with sutures only---Dr. Ezzard Standing   Discharge Condition: stable Disposition: home  Diet recommendation: regular  Filed Weights   05/03/14 2323  Weight: 83.462 kg (184 lb)     Filed Vitals:   05/07/14 0528  BP: 112/78  Pulse: 65  Temp: 98.2 F (36.8 C)  Resp: 14     Hospital Course:  Michelle Hurley is a 64 y.o.f presented to [facility] ER today for abdominal pain and mass.  CT of abdomen and pelvis showed a small incarcerated ventral hernia.  She was admitted and underwent the procedure listed above.  She tolerated the surgery well and was transferred to the floor.  She was mobilized.  Diet was advanced as ileus resolved.  Blood pressure medication were resumed and bp remained stable.  On POD#4 the patient was tolerating a diet, having BMs, ambulating, pain well controlled and voiding.  She was therefore felt stable for discharge home.  Medication risks, benefits and therapeutic alternatives were reviewed with the patient.  She verbalizes understanding.  Warning signs that warrant immediate attention were discussed.  A follow up appointment was provided.  She was encouraged to call with questions or concerns.    Physical Exam: General: Pt awake/alert/oriented x4 in no acute  distress Chest: cta. No chest wall pain w good excursion CV: Pulses intact. Regular rhythm Abdomen: Soft. Nondistended. Tender to right abdomen. Midline incision-dermabond, no erythema, or drainage. No evidence of peritonitis. No incarcerated hernias. Ext: SCDs BLE. No mjr edema. No cyanosis Skin: No petechiae / purpura   Discharge Instructions     Medication List    TAKE these medications        carvedilol 25 MG tablet  Commonly known as:  COREG  Take 25 mg by mouth 2 (two) times daily with a meal.     furosemide 40 MG tablet  Commonly known as:  LASIX  Take 40 mg by mouth daily.     loratadine 10 MG tablet  Commonly known as:  CLARITIN  Take 10 mg by mouth daily as needed for allergies (allergies).     lovastatin 20 MG tablet  Commonly known as:  MEVACOR  Take 20 mg by mouth at bedtime.     spironolactone 25 MG tablet  Commonly known as:  ALDACTONE  Take 25 mg by mouth daily.     traMADol 50 MG tablet  Commonly known as:  ULTRAM  Take 1 tablet (50 mg total) by mouth every 6 (six) hours as needed for moderate pain or severe pain.     verapamil 120 MG tablet  Commonly known as:  CALAN  Take 60 mg by mouth 3 (three) times daily.           Follow-up Information    Follow up with [facility] H, MD On 05/27/2014.   Specialty:  General  Surgery   Why:  arrive by 9:45AM for a 10:15AM post operative check up   Contact information:   [address] [place] [address] [phone]        The results of significant diagnostics from this hospitalization (including imaging, microbiology, ancillary and laboratory) are listed below for reference.    Significant Diagnostic Studies: Ct Abdomen Pelvis Wo Contrast  05/03/2014   CLINICAL DATA:  Acute onset of umbilical hernia pain. Evaluate for incarceration. Initial encounter.  EXAM: CT ABDOMEN AND PELVIS WITHOUT CONTRAST  TECHNIQUE: Multidetector CT imaging of the abdomen and pelvis was performed  following the standard protocol without IV contrast.  COMPARISON:  None.  FINDINGS: Minimal right basilar atelectasis is noted.  There is incarceration of a short segment of proximal ileum at the level of a midline anterior abdominal wall hernia just superior to the umbilicus. The hernia is moderate in size, with mild fluid noted about the herniated segment. The herniated segment is distended to 4.2 x 4.1 x 2.8 cm. There is decompression of more distal small bowel loops, though no significant dilatation of more proximal loops, likely reflecting partial small bowel obstruction. Given the dilatation of the herniated segment and a relatively narrow hernia neck, this will not resolve on its own.  The liver and spleen are unremarkable in appearance. The gallbladder is within normal limits. The pancreas and adrenal glands are unremarkable.  The kidneys are unremarkable in appearance. There is no evidence of hydronephrosis. No renal or ureteral stones are seen. No perinephric stranding is appreciated.  No free fluid is identified. The small bowel is unremarkable in appearance. The stomach is within normal limits. No acute vascular abnormalities are seen.  The appendix is not definitely characterized; there is no evidence of appendicitis. Mild apparent wall thickening along the transverse colon is thought reflect relative decompression. Mild diverticulosis is noted along the distal descending colon. The colon is otherwise unremarkable in appearance.  The bladder is mildly distended and grossly unremarkable. The patient is status post hysterectomy. No suspicious adnexal masses are seen. No inguinal lymphadenopathy is seen.  No acute osseous abnormalities are identified. Mild facet disease is noted at the lower lumbar spine.  IMPRESSION: 1. Incarceration of a short segment of proximal ileum at the level of a midline anterior abdominal wall hernia, just superior to the umbilicus. The hernia is moderate in size, with mild fluid  about the herniated segment of small bowel. Distention of the herniated segment to 4.2 x 4.1 x 2.8 cm, with decompression of more distal small bowel loops, likely reflecting partial small bowel obstruction. Given dilatation of the herniated segment and a relatively narrow hernia neck, this will not resolve on its own. 2. Mild diverticulosis along the distal descending colon, without evidence of diverticulitis.  These results were called by telephone at the time of interpretation on 05/03/2014 at 9:41 pm to [place] [facility] PA, who verbally acknowledged these results.   Electronically Signed   By: Roanna Raider M.D.   On: 05/03/2014 21:42    Microbiology: Recent Results (from the past 240 hour(s))  Urine culture     Status: None   Collection Time: 05/03/14  6:57 PM  Result Value Ref Range Status   Specimen Description URINE, CLEAN CATCH  Final   Special Requests NONE  Final   Colony Count   Final    15,000 COLONIES/ML Performed at [facility]    Culture   Final    Multiple bacterial morphotypes  present, none predominant. Suggest appropriate recollection if clinically indicated. Performed at [facility]    Report Status 05/05/2014 FINAL  Final     Labs: Basic Metabolic Panel:  Recent Labs Lab 05/03/14 1950 05/04/14 0912 05/06/14 0544  NA 139 134* 134*  K 4.0 4.3 4.0  CL 103 105 102  CO2 27 23 25   GLUCOSE 95 116* 93  BUN 20 22 23   CREATININE 1.28* 1.18* 1.08  CALCIUM 9.6 8.5 8.8   Liver Function Tests:  Recent Labs Lab 05/03/14 1950  AST 22  ALT 23  ALKPHOS 61  BILITOT 0.6  PROT 8.5*  ALBUMIN 4.4    Recent Labs Lab 05/03/14 1950  LIPASE 23   No results for input(s): AMMONIA in the last 168 hours. CBC:  Recent Labs Lab 05/03/14 1950 05/04/14 0912  WBC 5.8 7.4  NEUTROABS 4.2 6.7  HGB 14.1 13.3  HCT 43.2 40.5  MCV 82.6 83.0  PLT 243 230   Cardiac Enzymes: No results for input(s): CKTOTAL, CKMB, CKMBINDEX, TROPONINI in the last 168  hours. BNP: BNP (last 3 results) No results for input(s): BNP in the last 8760 hours.  ProBNP (last 3 results) No results for input(s): PROBNP in the last 8760 hours.  CBG: No results for input(s): GLUCAP in the last 168 hours.  Active Problems:   Incarcerated epigastric hernia   S/P hernia repair   Essential hypertension   Time coordinating discharge: <30 mins  Signed:  Myrtle Barnhard, ANP-BC

## 2014-05-07 NOTE — Discharge Instructions (Signed)

## 2014-05-07 NOTE — Progress Notes (Signed)
Patient is alert and oriented, vital signs are stable, incisions are within normal limits, patient tolerated her regular diet without complaints of nausea or vomiting, pain controlled adequately with Ultram, patient to follow up with [facility] Neta Mends RN 9:37 AM 05-07-2014

## 2014-05-11 ENCOUNTER — Encounter: Payer: Self-pay | Admitting: Internal Medicine

## 2014-05-11 ENCOUNTER — Ambulatory Visit: Payer: Commercial Managed Care - HMO | Attending: Internal Medicine | Admitting: Internal Medicine

## 2014-05-11 VITALS — BP 145/93 | HR 80 | Temp 98.0°F | Resp 16 | Wt 178.9 lb

## 2014-05-11 DIAGNOSIS — Z9889 Other specified postprocedural states: Secondary | ICD-10-CM | POA: Insufficient documentation

## 2014-05-11 DIAGNOSIS — I10 Essential (primary) hypertension: Secondary | ICD-10-CM | POA: Insufficient documentation

## 2014-05-11 DIAGNOSIS — I472 Ventricular tachycardia: Secondary | ICD-10-CM | POA: Insufficient documentation

## 2014-05-11 DIAGNOSIS — E7800 Pure hypercholesterolemia, unspecified: Secondary | ICD-10-CM

## 2014-05-11 DIAGNOSIS — I4729 Other ventricular tachycardia: Secondary | ICD-10-CM

## 2014-05-11 MED ORDER — VERAPAMIL HCL 120 MG PO TABS: ORAL_TABLET | ORAL | Status: DC

## 2014-05-11 MED ORDER — SPIRONOLACTONE 25 MG PO TABS: 25.0000 mg | ORAL_TABLET | Freq: Every day | ORAL | Status: DC

## 2014-05-11 MED ORDER — CARVEDILOL 25 MG PO TABS: 25.0000 mg | ORAL_TABLET | Freq: Two times a day (BID) | ORAL | Status: DC

## 2014-05-11 MED ORDER — LOVASTATIN 20 MG PO TABS: 20.0000 mg | ORAL_TABLET | Freq: Every day | ORAL | Status: DC

## 2014-05-11 MED ORDER — FUROSEMIDE 40 MG PO TABS: 40.0000 mg | ORAL_TABLET | Freq: Every day | ORAL | Status: DC

## 2014-05-11 NOTE — Progress Notes (Signed)
[name], female   DOB: March 09, 1950, 64 y.o.   MRN: [id number]   Michelle Hurley, is a 64 y.o. female  [id number]  [id number]  DOB - [birth year]  Chief Complaint  Patient presents with  . Follow-up        Subjective:   Michelle Hurley is a 64 y.o. female here today for a follow up visit. Her medical history includes hypertension, obesity, umbilical hernia status post surgery last week. Patient is here today for follow-up of surgery. He had presented to the [facility] ED on April 24 with severe abdominal pain and abdominal mass, she was evaluated and found to have incarcerated umbilical hernia. Patient subsequently was admitted that she had surgery/laparoscopic exploration with repair. She tolerated the surgery well, Blood pressure medication were resumed and bp remained stable. On POD#4 the patient was tolerating regular diet, having BMs, ambulating, pain well controlled and voiding. She was therefore felt stable for discharge home and was given this appointment for follow-up today. Patient has no complaints. She claims to be doing very well, no nausea or vomiting, no abdominal distention, no fever. She had minimal pain around surgical sites otherwise nothing major. Patient has No headache, No chest pain, No new weakness tingling or numbness, No Cough - SOB.  No problems updated.  ALLERGIES: Allergies  Allergen Reactions  . Codeine Nausea And Vomiting  . Prednisone Nausea And Vomiting    PAST MEDICAL HISTORY: Past Medical History  Diagnosis Date  . Myopia   . Anxiety state, unspecified   . Morbid obesity   . Other and unspecified hyperlipidemia   . Unspecified essential hypertension   . Osteopenia   . Umbilical hernia   . Palpitations   . Arthritis   . H/O: hysterectomy   . NSVT (nonsustained ventricular tachycardia)     a. 01/2005 Echo: EF 55-65%    MEDICATIONS AT HOME: Prior to Admission medications   Medication Sig Start Date End Date  Taking? Authorizing Provider  carvedilol (COREG) 25 MG tablet Take 1 tablet (25 mg total) by mouth 2 (two) times daily with a meal. 05/11/14   Tresa Garter, MD  furosemide (LASIX) 40 MG tablet Take 1 tablet (40 mg total) by mouth daily. 05/11/14   Tresa Garter, MD  ibuprofen (ADVIL,MOTRIN) 200 MG tablet Take 200 mg by mouth every 6 (six) hours as needed.      Historical Provider, MD  lovastatin (MEVACOR) 20 MG tablet Take 1 tablet (20 mg total) by mouth at bedtime. 05/11/14   Tresa Garter, MD  spironolactone (ALDACTONE) 25 MG tablet Take 1 tablet (25 mg total) by mouth daily. 05/11/14   Tresa Garter, MD  verapamil (CALAN) 120 MG tablet TAKE ONE-HALF TABLET BY MOUTH THREE TIMES DAILY 05/11/14   Tresa Garter, MD     Objective:   Filed Vitals:   05/11/14 1428  BP: 145/93  Pulse: 80  Temp: 98 F (36.7 C)  Resp: 16  Weight: 178 lb 14.4 oz (81.149 kg)  SpO2: 100%    Exam General appearance : Awake, alert, not in any distress. Speech Clear. Not toxic looking HEENT: Atraumatic and Normocephalic, pupils equally reactive to light and accomodation Neck: supple, no JVD. No cervical lymphadenopathy.  Chest:Good air entry bilaterally, no added sounds  CVS: S1 S2 regular, no murmurs.  Abdomen: Bowel sounds present, minimal tenderness around surgical sites, not distended with no gaurding, rigidity or rebound. Extremities: B/L Lower Ext shows no edema, both legs are  warm to touch Neurology: Awake alert, and oriented X 3, CN II-XII intact, Non focal Skin:No Rash  Data Review Lab Results  Component Value Date   HGBA1C 5.9 04/24/2013   HGBA1C 5.7 08/15/2012   HGBA1C 6.4* 04/06/2012     Assessment & Plan   1. Paroxysmal ventricular tachycardia  - verapamil (CALAN) 120 MG tablet; TAKE ONE-HALF TABLET BY MOUTH THREE TIMES DAILY  Dispense: 90 tablet; Refill: 3  2. High cholesterol  - lovastatin (MEVACOR) 20 MG tablet; Take 1 tablet (20 mg total) by mouth at  bedtime.  Dispense: 90 tablet; Refill: 3 To address this please limit saturated fat to no more than 7% of your calories, limit cholesterol to 200 mg/day, increase fiber and exercise as tolerated. If needed we may add another cholesterol lowering medication to your regimen.   3. Essential hypertension  - carvedilol (COREG) 25 MG tablet; Take 1 tablet (25 mg total) by mouth 2 (two) times daily with a meal.  Dispense: 180 tablet; Refill: 3 - furosemide (LASIX) 40 MG tablet; Take 1 tablet (40 mg total) by mouth daily.  Dispense: 90 tablet; Refill: 3 - spironolactone (ALDACTONE) 25 MG tablet; Take 1 tablet (25 mg total) by mouth daily.  Dispense: 90 tablet; Refill: 3  We have discussed target BP range and blood pressure goal. I have advised patient to check BP regularly and to call us back or report to clinic if the numbers are consistently higher than 140/90. We discussed the importance of compliance with medical therapy and DASH diet recommended, consequences of uncontrolled hypertension discussed.  - continue current BP medications  4. Follow-up Hernia Repair  Patient have been counseled extensively about nutrition and exercise Return in about 3 months (around 08/11/2014), or if symptoms worsen or fail to improve, for Follow up HTN, Medication Management.  The patient was given clear instructions to go to ER or return to medical center if symptoms don't improve, worsen or new problems develop. The patient verbalized understanding. The patient was told to call to get lab results if they haven't heard anything in the next week.   This note has been created with Surveyor, quantity. Any transcriptional errors are unintentional.    Angelica Chessman, MD, [place], [place], [place], [place] and [place], [place]   05/11/2014, 4:30 PM

## 2014-05-11 NOTE — Patient Instructions (Signed)
DASH Eating Plan DASH stands for "Dietary Approaches to Stop Hypertension." The DASH eating plan is a healthy eating plan that has been shown to reduce high blood pressure (hypertension). Additional health benefits may include reducing the risk of type 2 diabetes mellitus, heart disease, and stroke. The DASH eating plan may also help with weight loss. WHAT DO I NEED TO KNOW ABOUT THE DASH EATING PLAN? For the DASH eating plan, you will follow these general guidelines:  Choose foods with a percent daily value for sodium of less than 5% (as listed on the food label).  Use salt-free seasonings or herbs instead of table salt or sea salt.  Check with your health care provider or pharmacist before using salt substitutes.  Eat lower-sodium products, often labeled as "lower sodium" or "no salt added."  Eat fresh foods.  Eat more vegetables, fruits, and low-fat dairy products.  Choose whole grains. Look for the word "whole" as the first word in the ingredient list.  Choose fish and skinless chicken or turkey more often than red meat. Limit fish, poultry, and meat to 6 oz (170 g) each day.  Limit sweets, desserts, sugars, and sugary drinks.  Choose heart-healthy fats.  Limit cheese to 1 oz (28 g) per day.  Eat more home-cooked food and less restaurant, buffet, and fast food.  Limit fried foods.  Cook foods using methods other than frying.  Limit canned vegetables. If you do use them, rinse them well to decrease the sodium.  When eating at a restaurant, ask that your food be prepared with less salt, or no salt if possible. WHAT FOODS CAN I EAT? Seek help from a dietitian for individual calorie needs. Grains Whole grain or whole wheat bread. Brown rice. Whole grain or whole wheat pasta. Quinoa, bulgur, and whole grain cereals. Low-sodium cereals. Corn or whole wheat flour tortillas. Whole grain cornbread. Whole grain crackers. Low-sodium crackers. Vegetables Fresh or frozen vegetables  (raw, steamed, roasted, or grilled). Low-sodium or reduced-sodium tomato and vegetable juices. Low-sodium or reduced-sodium tomato sauce and paste. Low-sodium or reduced-sodium canned vegetables.  Fruits All fresh, canned (in natural juice), or frozen fruits. Meat and Other Protein Products Ground beef (85% or leaner), grass-fed beef, or beef trimmed of fat. Skinless chicken or turkey. Ground chicken or turkey. Pork trimmed of fat. All fish and seafood. Eggs. Dried beans, peas, or lentils. Unsalted nuts and seeds. Unsalted canned beans. Dairy Low-fat dairy products, such as skim or 1% milk, 2% or reduced-fat cheeses, low-fat ricotta or cottage cheese, or plain low-fat yogurt. Low-sodium or reduced-sodium cheeses. Fats and Oils Tub margarines without trans fats. Light or reduced-fat mayonnaise and salad dressings (reduced sodium). Avocado. Safflower, olive, or canola oils. Natural peanut or almond butter. Other Unsalted popcorn and pretzels. The items listed above may not be a complete list of recommended foods or beverages. Contact your dietitian for more options. WHAT FOODS ARE NOT RECOMMENDED? Grains White bread. White pasta. White rice. Refined cornbread. Bagels and croissants. Crackers that contain trans fat. Vegetables Creamed or fried vegetables. Vegetables in a cheese sauce. Regular canned vegetables. Regular canned tomato sauce and paste. Regular tomato and vegetable juices. Fruits Dried fruits. Canned fruit in light or heavy syrup. Fruit juice. Meat and Other Protein Products Fatty cuts of meat. Ribs, chicken wings, bacon, sausage, bologna, salami, chitterlings, fatback, hot dogs, bratwurst, and packaged luncheon meats. Salted nuts and seeds. Canned beans with salt. Dairy Whole or 2% milk, cream, half-and-half, and cream cheese. Whole-fat or sweetened yogurt. Full-fat   cheeses or blue cheese. Nondairy creamers and whipped toppings. Processed cheese, cheese spreads, or cheese  curds. Condiments Onion and garlic salt, seasoned salt, table salt, and sea salt. Canned and packaged gravies. Worcestershire sauce. Tartar sauce. Barbecue sauce. Teriyaki sauce. Soy sauce, including reduced sodium. Steak sauce. Fish sauce. Oyster sauce. Cocktail sauce. Horseradish. Ketchup and mustard. Meat flavorings and tenderizers. Bouillon cubes. Hot sauce. Tabasco sauce. Marinades. Taco seasonings. Relishes. Fats and Oils Butter, stick margarine, lard, shortening, ghee, and bacon fat. Coconut, palm kernel, or palm oils. Regular salad dressings. Other Pickles and olives. Salted popcorn and pretzels. The items listed above may not be a complete list of foods and beverages to avoid. Contact your dietitian for more information. WHERE CAN I FIND MORE INFORMATION? National Heart, Lung, and [employer] Institute: www.nhlbi.nih.gov/health/health-topics/topics/dash/ Document Released: 12/15/2010 Document Revised: 05/12/2013 Document Reviewed: 10/30/2012 ExitCare Patient Information 2015 [employer]. This information is not intended to replace advice given to you by your health care provider. Make sure you discuss any questions you have with your health care provider. Hypertension Hypertension, commonly called high blood pressure, is when the force of blood pumping through your arteries is too strong. Your arteries are the blood vessels that carry blood from your heart throughout your body. A blood pressure reading consists of a higher number over a lower number, such as 110/72. The higher number (systolic) is the pressure inside your arteries when your heart pumps. The lower number (diastolic) is the pressure inside your arteries when your heart relaxes. Ideally you want your blood pressure below 120/80. Hypertension forces your heart to work harder to pump blood. Your arteries may become narrow or stiff. Having hypertension puts you at risk for heart disease, stroke, and other problems.  RISK  FACTORS Some risk factors for high blood pressure are controllable. Others are not.  Risk factors you cannot control include:   Race. You may be at higher risk if you are African American.  Age. Risk increases with age.  Gender. Men are at higher risk than women before age 45 years. After age 65, women are at higher risk than men. Risk factors you can control include:  Not getting enough exercise or physical activity.  Being overweight.  Getting too much fat, sugar, calories, or salt in your diet.  Drinking too much alcohol. SIGNS AND SYMPTOMS Hypertension does not usually cause signs or symptoms. Extremely high blood pressure (hypertensive crisis) may cause headache, anxiety, shortness of breath, and nosebleed. DIAGNOSIS  To check if you have hypertension, your health care provider will measure your blood pressure while you are seated, with your arm held at the level of your heart. It should be measured at least twice using the same arm. Certain conditions can cause a difference in blood pressure between your right and left arms. A blood pressure reading that is higher than normal on one occasion does not mean that you need treatment. If one blood pressure reading is high, ask your health care provider about having it checked again. TREATMENT  Treating high blood pressure includes making lifestyle changes and possibly taking medicine. Living a healthy lifestyle can help lower high blood pressure. You may need to change some of your habits. Lifestyle changes may include:  Following the DASH diet. This diet is high in fruits, vegetables, and whole grains. It is low in salt, red meat, and added sugars.  Getting at least 2 hours of brisk physical activity every week.  Losing weight if necessary.  Not smoking.  Limiting   alcoholic beverages.  Learning ways to reduce stress. If lifestyle changes are not enough to get your blood pressure under control, your health care provider may  prescribe medicine. You may need to take more than one. Work closely with your health care provider to understand the risks and benefits. HOME CARE INSTRUCTIONS  Have your blood pressure rechecked as directed by your health care provider.   Take medicines only as directed by your health care provider. Follow the directions carefully. Blood pressure medicines must be taken as prescribed. The medicine does not work as well when you skip doses. Skipping doses also puts you at risk for problems.   Do not smoke.   Monitor your blood pressure at home as directed by your health care provider. SEEK MEDICAL CARE IF:   You think you are having a reaction to medicines taken.  You have recurrent headaches or feel dizzy.  You have swelling in your ankles.  You have trouble with your vision. SEEK IMMEDIATE MEDICAL CARE IF:  You develop a severe headache or confusion.  You have unusual weakness, numbness, or feel faint.  You have severe chest or abdominal pain.  You vomit repeatedly.  You have trouble breathing. MAKE SURE YOU:   Understand these instructions.  Will watch your condition.  Will get help right away if you are not doing well or get worse. Document Released: 12/26/2004 Document Revised: 05/12/2013 Document Reviewed: 10/18/2012 ExitCare Patient Information 2015 [employer]. This information is not intended to replace advice given to you by your health care provider. Make sure you discuss any questions you have with your health care provider.  

## 2014-05-11 NOTE — Progress Notes (Signed)
Patient here for follow up from hernia surgery last week

## 2014-05-18 ENCOUNTER — Encounter: Payer: Self-pay | Admitting: Internal Medicine

## 2014-05-18 ENCOUNTER — Telehealth: Payer: Self-pay | Admitting: Internal Medicine

## 2014-05-18 NOTE — Telephone Encounter (Signed)
Patient called to request a med refill for traMADol (ULTRAM) 50 MG tablet. Please f/u with pt.

## 2014-05-19 NOTE — Telephone Encounter (Signed)
Patient called stating she does not need to have Tramadol refilled. Patient will be taking otc tylenol instead.

## 2014-05-21 ENCOUNTER — Encounter (HOSPITAL_COMMUNITY): Payer: Self-pay | Admitting: Emergency Medicine

## 2014-05-21 ENCOUNTER — Observation Stay (HOSPITAL_COMMUNITY)
Admission: EM | Admit: 2014-05-21 | Discharge: 2014-05-23 | Disposition: A | Discharge status: Home / Self Care | Payer: Commercial Managed Care - HMO | Source: Home / Self Care | Attending: Emergency Medicine | Admitting: Emergency Medicine

## 2014-05-21 DIAGNOSIS — R1011 Right upper quadrant pain: Secondary | ICD-10-CM | POA: Diagnosis not present

## 2014-05-21 DIAGNOSIS — R404 Transient alteration of awareness: Secondary | ICD-10-CM | POA: Diagnosis not present

## 2014-05-21 DIAGNOSIS — F411 Generalized anxiety disorder: Secondary | ICD-10-CM | POA: Diagnosis present

## 2014-05-21 DIAGNOSIS — K2980 Duodenitis without bleeding: Secondary | ICD-10-CM | POA: Diagnosis present

## 2014-05-21 DIAGNOSIS — W19XXXA Unspecified fall, initial encounter: Secondary | ICD-10-CM | POA: Diagnosis not present

## 2014-05-21 DIAGNOSIS — Z888 Allergy status to other drugs, medicaments and biological substances status: Secondary | ICD-10-CM

## 2014-05-21 DIAGNOSIS — Z6833 Body mass index (BMI) 33.0-33.9, adult: Secondary | ICD-10-CM

## 2014-05-21 DIAGNOSIS — R42 Dizziness and giddiness: Secondary | ICD-10-CM | POA: Diagnosis not present

## 2014-05-21 DIAGNOSIS — K2960 Other gastritis without bleeding: Secondary | ICD-10-CM | POA: Diagnosis present

## 2014-05-21 DIAGNOSIS — Z833 Family history of diabetes mellitus: Secondary | ICD-10-CM

## 2014-05-21 DIAGNOSIS — K259 Gastric ulcer, unspecified as acute or chronic, without hemorrhage or perforation: Secondary | ICD-10-CM | POA: Diagnosis present

## 2014-05-21 DIAGNOSIS — E669 Obesity, unspecified: Secondary | ICD-10-CM | POA: Diagnosis present

## 2014-05-21 DIAGNOSIS — K56609 Unspecified intestinal obstruction, unspecified as to partial versus complete obstruction: Secondary | ICD-10-CM

## 2014-05-21 DIAGNOSIS — R109 Unspecified abdominal pain: Secondary | ICD-10-CM | POA: Insufficient documentation

## 2014-05-21 DIAGNOSIS — I10 Essential (primary) hypertension: Secondary | ICD-10-CM

## 2014-05-21 DIAGNOSIS — K9189 Other postprocedural complications and disorders of digestive system: Secondary | ICD-10-CM | POA: Diagnosis present

## 2014-05-21 DIAGNOSIS — Z9102 Food additives allergy status: Secondary | ICD-10-CM

## 2014-05-21 DIAGNOSIS — B9681 Helicobacter pylori [H. pylori] as the cause of diseases classified elsewhere: Secondary | ICD-10-CM | POA: Diagnosis present

## 2014-05-21 DIAGNOSIS — K8020 Calculus of gallbladder without cholecystitis without obstruction: Secondary | ICD-10-CM | POA: Diagnosis present

## 2014-05-21 DIAGNOSIS — K565 Intestinal adhesions [bands] with obstruction (postprocedural) (postinfection): Secondary | ICD-10-CM | POA: Diagnosis present

## 2014-05-21 DIAGNOSIS — K769 Liver disease, unspecified: Secondary | ICD-10-CM | POA: Diagnosis not present

## 2014-05-21 DIAGNOSIS — Z808 Family history of malignant neoplasm of other organs or systems: Secondary | ICD-10-CM | POA: Diagnosis not present

## 2014-05-21 DIAGNOSIS — Z79899 Other long term (current) drug therapy: Secondary | ICD-10-CM

## 2014-05-21 DIAGNOSIS — E785 Hyperlipidemia, unspecified: Secondary | ICD-10-CM

## 2014-05-21 DIAGNOSIS — Z6831 Body mass index (BMI) 31.0-31.9, adult: Secondary | ICD-10-CM

## 2014-05-21 DIAGNOSIS — R140 Abdominal distension (gaseous): Secondary | ICD-10-CM | POA: Diagnosis not present

## 2014-05-21 DIAGNOSIS — R112 Nausea with vomiting, unspecified: Secondary | ICD-10-CM | POA: Diagnosis present

## 2014-05-21 DIAGNOSIS — K828 Other specified diseases of gallbladder: Secondary | ICD-10-CM | POA: Diagnosis not present

## 2014-05-21 DIAGNOSIS — Z90710 Acquired absence of both cervix and uterus: Secondary | ICD-10-CM

## 2014-05-21 DIAGNOSIS — M1990 Unspecified osteoarthritis, unspecified site: Secondary | ICD-10-CM | POA: Diagnosis present

## 2014-05-21 DIAGNOSIS — N179 Acute kidney failure, unspecified: Secondary | ICD-10-CM

## 2014-05-21 DIAGNOSIS — Z8711 Personal history of peptic ulcer disease: Secondary | ICD-10-CM | POA: Diagnosis not present

## 2014-05-21 DIAGNOSIS — K257 Chronic gastric ulcer without hemorrhage or perforation: Secondary | ICD-10-CM | POA: Diagnosis not present

## 2014-05-21 DIAGNOSIS — K5669 Other intestinal obstruction: Secondary | ICD-10-CM | POA: Diagnosis not present

## 2014-05-21 DIAGNOSIS — Z885 Allergy status to narcotic agent status: Secondary | ICD-10-CM

## 2014-05-21 DIAGNOSIS — Z791 Long term (current) use of non-steroidal anti-inflammatories (NSAID): Secondary | ICD-10-CM | POA: Insufficient documentation

## 2014-05-21 DIAGNOSIS — Z8249 Family history of ischemic heart disease and other diseases of the circulatory system: Secondary | ICD-10-CM | POA: Diagnosis not present

## 2014-05-21 DIAGNOSIS — K529 Noninfective gastroenteritis and colitis, unspecified: Secondary | ICD-10-CM | POA: Diagnosis not present

## 2014-05-21 DIAGNOSIS — Y92231 Patient bathroom in hospital as the place of occurrence of the external cause: Secondary | ICD-10-CM

## 2014-05-21 DIAGNOSIS — K6389 Other specified diseases of intestine: Secondary | ICD-10-CM | POA: Diagnosis not present

## 2014-05-21 DIAGNOSIS — Y838 Other surgical procedures as the cause of abnormal reaction of the patient, or of later complication, without mention of misadventure at the time of the procedure: Secondary | ICD-10-CM | POA: Diagnosis present

## 2014-05-21 DIAGNOSIS — K253 Acute gastric ulcer without hemorrhage or perforation: Secondary | ICD-10-CM | POA: Diagnosis not present

## 2014-05-21 DIAGNOSIS — R1013 Epigastric pain: Secondary | ICD-10-CM | POA: Diagnosis not present

## 2014-05-21 DIAGNOSIS — R1010 Upper abdominal pain, unspecified: Secondary | ICD-10-CM | POA: Diagnosis not present

## 2014-05-21 DIAGNOSIS — M8580 Other specified disorders of bone density and structure, unspecified site: Secondary | ICD-10-CM | POA: Diagnosis present

## 2014-05-21 DIAGNOSIS — K838 Other specified diseases of biliary tract: Secondary | ICD-10-CM | POA: Diagnosis not present

## 2014-05-21 DIAGNOSIS — K5660 Unspecified intestinal obstruction: Secondary | ICD-10-CM | POA: Diagnosis not present

## 2014-05-21 DIAGNOSIS — R933 Abnormal findings on diagnostic imaging of other parts of digestive tract: Secondary | ICD-10-CM | POA: Diagnosis not present

## 2014-05-21 DIAGNOSIS — R52 Pain, unspecified: Secondary | ICD-10-CM

## 2014-05-21 DIAGNOSIS — R197 Diarrhea, unspecified: Secondary | ICD-10-CM

## 2014-05-21 DIAGNOSIS — I700 Atherosclerosis of aorta: Secondary | ICD-10-CM | POA: Diagnosis not present

## 2014-05-21 DIAGNOSIS — R110 Nausea: Secondary | ICD-10-CM | POA: Diagnosis not present

## 2014-05-21 DIAGNOSIS — E860 Dehydration: Secondary | ICD-10-CM | POA: Diagnosis not present

## 2014-05-21 DIAGNOSIS — E875 Hyperkalemia: Secondary | ICD-10-CM | POA: Diagnosis not present

## 2014-05-21 DIAGNOSIS — K598 Other specified functional intestinal disorders: Secondary | ICD-10-CM | POA: Diagnosis not present

## 2014-05-21 LAB — CBC WITH DIFFERENTIAL/PLATELET
Basophils Absolute: 0 10*3/uL (ref 0.0–0.1)
Basophils Relative: 0 % (ref 0–1)
EOS PCT: 1 % (ref 0–5)
Eosinophils Absolute: 0 10*3/uL (ref 0.0–0.7)
HCT: 42.9 % (ref 36.0–46.0)
Hemoglobin: 14.5 g/dL (ref 12.0–15.0)
LYMPHS ABS: 1.7 10*3/uL (ref 0.7–4.0)
LYMPHS PCT: 30 % (ref 12–46)
MCH: 27.4 pg (ref 26.0–34.0)
MCHC: 33.8 g/dL (ref 30.0–36.0)
MCV: 81.1 fL (ref 78.0–100.0)
Monocytes Absolute: 0.4 10*3/uL (ref 0.1–1.0)
Monocytes Relative: 7 % (ref 3–12)
Neutro Abs: 3.6 10*3/uL (ref 1.7–7.7)
Neutrophils Relative %: 63 % (ref 43–77)
Platelets: 314 10*3/uL (ref 150–400)
RBC: 5.29 MIL/uL — AB (ref 3.87–5.11)
RDW: 14 % (ref 11.5–15.5)
WBC: 5.8 10*3/uL (ref 4.0–10.5)

## 2014-05-21 NOTE — ED Provider Notes (Signed)
CSN: [id number]     Arrival date & time 05/21/14  2154 History  This chart was scribed for Michelle Schadt, MD by Jeanell Sparrow, ED Scribe. This patient was seen in room WA17/WA17 and the patient's care was started at 11:35 PM.   Chief Complaint  Patient presents with  . Abdominal Pain   Patient is a 64 y.o. female presenting with abdominal pain. The history is provided by the patient. No language interpreter was used.  Abdominal Pain Pain location:  Epigastric Pain quality: sharp   Pain radiates to:  Does not radiate Pain severity:  Moderate Onset quality:  Gradual Timing:  Intermittent Progression:  Unchanged Chronicity:  New Context: previous surgery   Context: not sick contacts   Relieved by:  None tried Worsened by:  Nothing tried Ineffective treatments:  None tried Associated symptoms: diarrhea    HPI Comments: Michelle Hurley is a 64 y.o. female who presents to the Emergency Department complaining of intermittent moderate upper abdominal pain that started 2 days ago. She states that she recently had abdominal surgery. She reports that she had a laxative that may have caused her symptoms. She reports that last night and today she had several episodes diarrhea. She describes the pain as a sharp sensation. She denies any sick contacts.   Past Medical History  Diagnosis Date  . Hypertension   . Myopia   . Anxiety state, unspecified   . Morbid obesity   . Other and unspecified hyperlipidemia   . Unspecified essential hypertension   . Osteopenia   . Umbilical hernia   . Palpitations   . Arthritis   . H/O: hysterectomy   . NSVT (nonsustained ventricular tachycardia)     a. 01/2005 Echo: EF 55-65%   Past Surgical History  Procedure Laterality Date  . Laparoscopic transabdominal hernia N/A 05/03/2014    Procedure: LAPAROSCOPIC EXPLORATION WITH OPEN REPAIR OF INCARCERATED EPIGASTRIC HERNIA ;  Surgeon: Alphonsa Overall, MD;  Location: [facility];  Service: General;  Laterality: N/A;   . Abdominal hysterectomy  1974   Family History  Problem Relation Age of Onset  . Heart disease Mother   . Diabetes Mother   . Thyroid cancer Sister   . Diabetes Sister   . Hypertension Brother    History  Substance Use Topics  . Smoking status: Never Smoker   . Smokeless tobacco: Never Used  . Alcohol Use: No   OB History    Gravida Para Term Preterm AB TAB SAB Ectopic Multiple Living   0 0 0 0 0 0 0 0       Review of Systems  Gastrointestinal: Positive for abdominal pain and diarrhea.  All other systems reviewed and are negative.   Allergies  Mushroom extract complex; Codeine; Hydrocodone; Codeine; Penicillins; and Prednisone  Home Medications   Prior to Admission medications   Medication Sig Start Date End Date Taking? Authorizing Provider  carvedilol (COREG) 25 MG tablet Take 1 tablet (25 mg total) by mouth 2 (two) times daily with a meal. 05/11/14   Tresa Garter, MD  carvedilol (COREG) 25 MG tablet Take 25 mg by mouth 2 (two) times daily with a meal.    Historical Provider, MD  furosemide (LASIX) 40 MG tablet Take 1 tablet (40 mg total) by mouth daily. 05/11/14   Tresa Garter, MD  furosemide (LASIX) 40 MG tablet Take 40 mg by mouth daily.    Historical Provider, MD  ibuprofen (ADVIL,MOTRIN) 200 MG tablet Take 200 mg by  mouth every 6 (six) hours as needed.      Historical Provider, MD  loratadine (CLARITIN) 10 MG tablet Take 10 mg by mouth daily as needed for allergies (allergies).    Historical Provider, MD  lovastatin (MEVACOR) 20 MG tablet Take 1 tablet (20 mg total) by mouth at bedtime. 05/11/14   Tresa Garter, MD  lovastatin (MEVACOR) 20 MG tablet Take 20 mg by mouth at bedtime.    Historical Provider, MD  spironolactone (ALDACTONE) 25 MG tablet Take 25 mg by mouth daily.    Historical Provider, MD  spironolactone (ALDACTONE) 25 MG tablet Take 1 tablet (25 mg total) by mouth daily. 05/11/14   Tresa Garter, MD  traMADol (ULTRAM) 50 MG tablet  Take 1 tablet (50 mg total) by mouth every 6 (six) hours as needed for moderate pain or severe pain. 05/07/14   Emina Riebock, NP  verapamil (CALAN) 120 MG tablet Take 60 mg by mouth 3 (three) times daily.    Historical Provider, MD  verapamil (CALAN) 120 MG tablet TAKE ONE-HALF TABLET BY MOUTH THREE TIMES DAILY 05/11/14   Tresa Garter, MD   BP 134/93 mmHg  Pulse 85  Temp(Src) 98.2 F (36.8 C) (Oral)  Resp 16  SpO2 99%  LMP 04/05/2012 Physical Exam  Constitutional: She is oriented to person, place, and time. She appears well-developed and well-nourished. No distress.  HENT:  Head: Normocephalic and atraumatic.  Mouth/Throat: Oropharynx is clear and moist. No oropharyngeal exudate.  Moist mucous membranes.   Neck: Neck supple. No tracheal deviation present.  Cardiovascular: Normal rate, regular rhythm and normal heart sounds.  Exam reveals no gallop and no friction rub.   No murmur heard. Pulmonary/Chest: Effort normal. No respiratory distress. She has no wheezes. She has no rales.  Abdominal: Soft. She exhibits no distension. There is no tenderness. There is no rebound and no guarding.  Hyperactive bowel sound in epigastrium.   Musculoskeletal: Normal range of motion.  Neurological: She is alert and oriented to person, place, and time.  Skin: Skin is warm and dry.  Surgical site clean and dry.   Psychiatric: She has a normal mood and affect. Her behavior is normal.  Nursing note and vitals reviewed.   ED Course  Procedures (including critical care time) DIAGNOSTIC STUDIES: Oxygen Saturation is 99% on RA, normal by my interpretation.    COORDINATION OF CARE: 11:39 PM- Pt advised of plan for treatment and pt agrees.  Labs Review Labs Reviewed  CBC WITH DIFFERENTIAL/PLATELET - Abnormal; Notable for the following:    RBC 5.29 (*)    All other components within normal limits  URINALYSIS COMPLETEWITH MICROSCOPIC [facility])     Imaging Review No results found.   EKG  Interpretation None      MDM   Final diagnoses:  None   Results for orders placed or performed during the hospital encounter of 05/21/14  CBC with Differential  Result Value Ref Range   WBC 5.8 4.0 - 10.5 K/uL   RBC 5.29 (H) 3.87 - 5.11 MIL/uL   Hemoglobin 14.5 12.0 - 15.0 g/dL   HCT 42.9 36.0 - 46.0 %   MCV 81.1 78.0 - 100.0 fL   MCH 27.4 26.0 - 34.0 pg   MCHC 33.8 30.0 - 36.0 g/dL   RDW 14.0 11.5 - 15.5 %   Platelets 314 150 - 400 K/uL   Neutrophils Relative % 63 43 - 77 %   Neutro Abs 3.6 1.7 - 7.7 K/uL  Lymphocytes Relative 30 12 - 46 %   Lymphs Abs 1.7 0.7 - 4.0 K/uL   Monocytes Relative 7 3 - 12 %   Monocytes Absolute 0.4 0.1 - 1.0 K/uL   Eosinophils Relative 1 0 - 5 %   Eosinophils Absolute 0.0 0.0 - 0.7 K/uL   Basophils Relative 0 0 - 1 %   Basophils Absolute 0.0 0.0 - 0.1 K/uL  I-stat chem 8, ed  Result Value Ref Range   Sodium 139 135 - 145 mmol/L   Potassium 3.9 3.5 - 5.1 mmol/L   Chloride 103 101 - 111 mmol/L   BUN 20 6 - 20 mg/dL   Creatinine, Ser 1.20 (H) 0.44 - 1.00 mg/dL   Glucose, Bld 101 (H) 65 - 99 mg/dL   Calcium, Ion 1.19 1.13 - 1.30 mmol/L   TCO2 21 0 - 100 mmol/L   Hemoglobin 16.3 (H) 12.0 - 15.0 g/dL   HCT 48.0 (H) 36.0 - 46.0 %   Ct Abdomen Pelvis Wo Contrast  05/03/2014   CLINICAL DATA:  Acute onset of umbilical hernia pain. Evaluate for incarceration. Initial encounter.  EXAM: CT ABDOMEN AND PELVIS WITHOUT CONTRAST  TECHNIQUE: Multidetector CT imaging of the abdomen and pelvis was performed following the standard protocol without IV contrast.  COMPARISON:  None.  FINDINGS: Minimal right basilar atelectasis is noted.  There is incarceration of a short segment of proximal ileum at the level of a midline anterior abdominal wall hernia just superior to the umbilicus. The hernia is moderate in size, with mild fluid noted about the herniated segment. The herniated segment is distended to 4.2 x 4.1 x 2.8 cm. There is decompression of more distal  small bowel loops, though no significant dilatation of more proximal loops, likely reflecting partial small bowel obstruction. Given the dilatation of the herniated segment and a relatively narrow hernia neck, this will not resolve on its own.  The liver and spleen are unremarkable in appearance. The gallbladder is within normal limits. The pancreas and adrenal glands are unremarkable.  The kidneys are unremarkable in appearance. There is no evidence of hydronephrosis. No renal or ureteral stones are seen. No perinephric stranding is appreciated.  No free fluid is identified. The small bowel is unremarkable in appearance. The stomach is within normal limits. No acute vascular abnormalities are seen.  The appendix is not definitely characterized; there is no evidence of appendicitis. Mild apparent wall thickening along the transverse colon is thought reflect relative decompression. Mild diverticulosis is noted along the distal descending colon. The colon is otherwise unremarkable in appearance.  The bladder is mildly distended and grossly unremarkable. The patient is status post hysterectomy. No suspicious adnexal masses are seen. No inguinal lymphadenopathy is seen.  No acute osseous abnormalities are identified. Mild facet disease is noted at the lower lumbar spine.  IMPRESSION: 1. Incarceration of a short segment of proximal ileum at the level of a midline anterior abdominal wall hernia, just superior to the umbilicus. The hernia is moderate in size, with mild fluid about the herniated segment of small bowel. Distention of the herniated segment to 4.2 x 4.1 x 2.8 cm, with decompression of more distal small bowel loops, likely reflecting partial small bowel obstruction. Given dilatation of the herniated segment and a relatively narrow hernia neck, this will not resolve on its own. 2. Mild diverticulosis along the distal descending colon, without evidence of diverticulitis.  These results were called by telephone at  the time of interpretation on 05/03/2014 at 9:41  pm to [facility], who verbally acknowledged these results.   Electronically Signed   By: Garald Balding M.D.   On: 05/03/2014 21:42   Dg Abd Acute W/chest  05/22/2014   CLINICAL DATA:  Hernia surgery on 04/30. Upper abdominal pain and nausea for 24 hours.  EXAM: DG ABDOMEN ACUTE W/ 1V CHEST  COMPARISON:  Chest 08/20/2005  FINDINGS: Normal heart size and pulmonary vascularity. Lungs appear clear and expanded. No blunting of costophrenic angles. No pneumothorax.  Paucity of gas in the abdomen with a few air-fluid levels in the mid abdomen and suggestion of bowel wall thickening in the mid abdomen. This is likely to represent small bowel obstruction with predominant fluid-filled bowel. Paucity of gas in the colon. No radiopaque stones. Calcified phleboliths in the pelvis. Mild degenerative changes in the spine.  IMPRESSION: No evidence of active pulmonary disease. Abnormal bowel loops in the mid abdomen air-fluid levels probably representing small bowel obstruction with fluid-filled loops.   Electronically Signed   By: Lucienne Capers M.D.   On: 05/22/2014 01:06    Medications  fentaNYL (SUBLIMAZE) injection 100 mcg (not administered)  ondansetron (ZOFRAN) injection 4 mg (4 mg Intravenous Given 05/22/14 0119)  sodium chloride 0.9 % bolus 1,000 mL (1,000 mLs Intravenous New Bag/Given 05/22/14 0119)  ketorolac (TORADOL) 30 MG/ML injection 30 mg (30 mg Intravenous Given 05/22/14 0118)  sodium chloride 0.9 % bolus 1,000 mL (1,000 mLs Intravenous New Bag/Given 05/22/14 0247)    NGT to suction CT and admit  Case d/w Dr. Catalina Antigua   I personally performed the services described in this documentation, which was scribed in my presence. The recorded information has been reviewed and is accurate.      Veatrice Kells, MD 05/22/14 [phone]

## 2014-05-21 NOTE — ED Notes (Signed)
Pt c/o abdominal pain, diarrhea, N/V since Tuesday. Pt had umbilical hernia surgery two weeks ago and sts that Tuesday night she drank a tea that was also a laxative and has been c/o symptoms ever since. Pt denies any complications with surgery. A&Ox4 and ambulatory. Pt sts pain comes and goes. Pt has been unable to eat anything. Last episode of diarrhea was this morning around 9 am.

## 2014-05-22 ENCOUNTER — Emergency Department (HOSPITAL_COMMUNITY): Payer: Commercial Managed Care - HMO

## 2014-05-22 ENCOUNTER — Encounter (HOSPITAL_COMMUNITY): Payer: Self-pay | Admitting: Emergency Medicine

## 2014-05-22 DIAGNOSIS — R109 Unspecified abdominal pain: Secondary | ICD-10-CM | POA: Diagnosis present

## 2014-05-22 DIAGNOSIS — E860 Dehydration: Secondary | ICD-10-CM | POA: Diagnosis not present

## 2014-05-22 DIAGNOSIS — I10 Essential (primary) hypertension: Secondary | ICD-10-CM | POA: Diagnosis not present

## 2014-05-22 HISTORY — DX: Unspecified abdominal pain: R10.9

## 2014-05-22 LAB — URINALYSIS, ROUTINE W REFLEX MICROSCOPIC
GLUCOSE, UA: NEGATIVE mg/dL
Hgb urine dipstick: NEGATIVE
KETONES UR: NEGATIVE mg/dL
Leukocytes, UA: NEGATIVE
Nitrite: NEGATIVE
Protein, ur: NEGATIVE mg/dL
SPECIFIC GRAVITY, URINE: 1.022 (ref 1.005–1.030)
Urobilinogen, UA: 1 mg/dL (ref 0.0–1.0)
pH: 5.5 (ref 5.0–8.0)

## 2014-05-22 LAB — I-STAT CHEM 8, ED
BUN: 20 mg/dL (ref 6–20)
CHLORIDE: 103 mmol/L (ref 101–111)
Calcium, Ion: 1.19 mmol/L (ref 1.13–1.30)
Creatinine, Ser: 1.2 mg/dL — ABNORMAL HIGH (ref 0.44–1.00)
Glucose, Bld: 101 mg/dL — ABNORMAL HIGH (ref 65–99)
HCT: 48 % — ABNORMAL HIGH (ref 36.0–46.0)
Hemoglobin: 16.3 g/dL — ABNORMAL HIGH (ref 12.0–15.0)
POTASSIUM: 3.9 mmol/L (ref 3.5–5.1)
Sodium: 139 mmol/L (ref 135–145)
TCO2: 21 mmol/L (ref 0–100)

## 2014-05-22 LAB — CLOSTRIDIUM DIFFICILE BY PCR: CDIFFPCR: NEGATIVE

## 2014-05-22 MED ORDER — IOHEXOL 300 MG/ML  SOLN
50.0000 mL | Freq: Once | INTRAMUSCULAR | Status: AC | PRN
  Administered 2014-05-22: 50 mL via ORAL

## 2014-05-22 MED ORDER — ONDANSETRON HCL 4 MG/2ML IJ SOLN: 4.0000 mg | Freq: Four times a day (QID) | INTRAMUSCULAR | Status: DC | PRN

## 2014-05-22 MED ORDER — FENTANYL CITRATE (PF) 100 MCG/2ML IJ SOLN
100.0000 ug | Freq: Once | INTRAMUSCULAR | Status: AC
  Administered 2014-05-22: 100 ug via INTRAVENOUS
  Filled 2014-05-22: qty 2

## 2014-05-22 MED ORDER — IOHEXOL 300 MG/ML  SOLN
100.0000 mL | Freq: Once | INTRAMUSCULAR | Status: AC | PRN
  Administered 2014-05-22: 100 mL via INTRAVENOUS

## 2014-05-22 MED ORDER — ONDANSETRON HCL 4 MG/2ML IJ SOLN
4.0000 mg | Freq: Once | INTRAMUSCULAR | Status: AC
  Administered 2014-05-22: 4 mg via INTRAVENOUS
  Filled 2014-05-22: qty 2

## 2014-05-22 MED ORDER — SODIUM CHLORIDE 0.9 % IV BOLUS (SEPSIS)
1000.0000 mL | Freq: Once | INTRAVENOUS | Status: AC
Start: 2014-05-22 — End: 2014-05-22
  Administered 2014-05-22: 1000 mL via INTRAVENOUS

## 2014-05-22 MED ORDER — HYDROMORPHONE HCL 1 MG/ML IJ SOLN: 0.5000 mg | INTRAMUSCULAR | Status: DC | PRN

## 2014-05-22 MED ORDER — ACETAMINOPHEN 650 MG RE SUPP: 650.0000 mg | Freq: Four times a day (QID) | RECTAL | Status: DC | PRN

## 2014-05-22 MED ORDER — ACETAMINOPHEN 325 MG PO TABS: 650.0000 mg | ORAL_TABLET | Freq: Four times a day (QID) | ORAL | Status: DC | PRN

## 2014-05-22 MED ORDER — SODIUM CHLORIDE 0.9 % IV BOLUS (SEPSIS)
1000.0000 mL | Freq: Once | INTRAVENOUS | Status: AC
  Administered 2014-05-22: 1000 mL via INTRAVENOUS

## 2014-05-22 MED ORDER — FENTANYL CITRATE (PF) 100 MCG/2ML IJ SOLN: 100.0000 ug | Freq: Once | INTRAMUSCULAR | Status: DC

## 2014-05-22 MED ORDER — KETOROLAC TROMETHAMINE 30 MG/ML IJ SOLN
30.0000 mg | Freq: Once | INTRAMUSCULAR | Status: AC
  Administered 2014-05-22: 30 mg via INTRAVENOUS
  Filled 2014-05-22: qty 1

## 2014-05-22 MED ORDER — SODIUM CHLORIDE 0.9 % IV SOLN
INTRAVENOUS | Status: DC
  Administered 2014-05-22 – 2014-05-23 (×3): via INTRAVENOUS

## 2014-05-22 MED ORDER — TRAMADOL HCL 50 MG PO TABS: 50.0000 mg | ORAL_TABLET | Freq: Four times a day (QID) | ORAL | Status: DC | PRN

## 2014-05-22 MED ORDER — ENOXAPARIN SODIUM 40 MG/0.4ML ~~LOC~~ SOLN
40.0000 mg | SUBCUTANEOUS | Status: DC
  Administered 2014-05-22 – 2014-05-23 (×2): 40 mg via SUBCUTANEOUS
  Filled 2014-05-22 (×3): qty 0.4

## 2014-05-22 NOTE — H&P (Signed)
Chief Complaint: abdominal pain, nausea, vomiting and diarrhea HPI: Michelle Hurley is a 64 year old female recently admitted under our service for an incarcerated epigastric hernia.  She underwent a laparoscopic exploration with open hernia repair with sutures only by Dr. Lucia Gaskins on 05/04/14.  She presents today with epigastric abdominal pain associated with nausea, vomiting and a poor appetite.  She also had diarrhea yesterday.  This started on Wednesday.  On Tuesday evening she started drinking herbal tea for irritable bowels which state on label can cause symptoms above. She denies fever, chills or sweats.  She reports fair appetite since her surgery, but decreased Wednesday and only took in gatorade.  Denies ill close contacts.  A CT of abdomen and pelvis shows small bowel dilatation without obstruction, subq fat along the midline/incision.  She appears dehydrated with increased h&h, sCr 1.2.  Normal UA.  We have been asked to evaluate.    Past Medical History  Diagnosis Date  . Hypertension   . Myopia   . Anxiety state, unspecified   . Morbid obesity   . Other and unspecified hyperlipidemia   . Unspecified essential hypertension   . Osteopenia   . Umbilical hernia   . Palpitations   . Arthritis   . H/O: hysterectomy   . NSVT (nonsustained ventricular tachycardia)     a. 01/2005 Echo: EF 55-65%    Past Surgical History  Procedure Laterality Date  . Laparoscopic transabdominal hernia N/A 05/03/2014    Procedure: LAPAROSCOPIC EXPLORATION WITH OPEN REPAIR OF INCARCERATED EPIGASTRIC HERNIA ;  Surgeon: Alphonsa Overall, MD;  Location: [facility];  Service: General;  Laterality: N/A;  . Abdominal hysterectomy  1974    Family History  Problem Relation Age of Onset  . Heart disease Mother   . Diabetes Mother   . Thyroid cancer Sister   . Diabetes Sister   . Hypertension Brother    Social History:  reports that she has never smoked. She has never used smokeless tobacco. She reports that she does  not drink alcohol or use illicit drugs.  Allergies:  Allergies  Allergen Reactions  . Mushroom Extract Complex Anaphylaxis, Itching and Swelling    Swelling all over body   . Codeine Nausea And Vomiting  . Hydrocodone Nausea And Vomiting  . Codeine Nausea And Vomiting  . Prednisone Nausea And Vomiting     (Not in a hospital admission)  Results for orders placed or performed during the hospital encounter of 05/21/14 (from the past 48 hour(s))  CBC with Differential     Status: Abnormal   Collection Time: 05/21/14 10:56 PM  Result Value Ref Range   WBC 5.8 4.0 - 10.5 K/uL   RBC 5.29 (H) 3.87 - 5.11 MIL/uL   Hemoglobin 14.5 12.0 - 15.0 g/dL   HCT 42.9 36.0 - 46.0 %   MCV 81.1 78.0 - 100.0 fL   MCH 27.4 26.0 - 34.0 pg   MCHC 33.8 30.0 - 36.0 g/dL   RDW 14.0 11.5 - 15.5 %   Platelets 314 150 - 400 K/uL   Neutrophils Relative % 63 43 - 77 %   Neutro Abs 3.6 1.7 - 7.7 K/uL   Lymphocytes Relative 30 12 - 46 %   Lymphs Abs 1.7 0.7 - 4.0 K/uL   Monocytes Relative 7 3 - 12 %   Monocytes Absolute 0.4 0.1 - 1.0 K/uL   Eosinophils Relative 1 0 - 5 %   Eosinophils Absolute 0.0 0.0 - 0.7 K/uL   Basophils  Relative 0 0 - 1 %   Basophils Absolute 0.0 0.0 - 0.1 K/uL  I-stat chem 8, ed     Status: Abnormal   Collection Time: 05/22/14  1:32 AM  Result Value Ref Range   Sodium 139 135 - 145 mmol/L   Potassium 3.9 3.5 - 5.1 mmol/L   Chloride 103 101 - 111 mmol/L   BUN 20 6 - 20 mg/dL   Creatinine, Ser 1.20 (H) 0.44 - 1.00 mg/dL   Glucose, Bld 101 (H) 65 - 99 mg/dL   Calcium, Ion 1.19 1.13 - 1.30 mmol/L   TCO2 21 0 - 100 mmol/L   Hemoglobin 16.3 (H) 12.0 - 15.0 g/dL   HCT 48.0 (H) 36.0 - 46.0 %  Urinalysis, Routine w reflex microscopic     Status: Abnormal   Collection Time: 05/22/14  2:05 AM  Result Value Ref Range   Color, Urine AMBER (A) YELLOW    Comment: BIOCHEMICALS MAY BE AFFECTED BY COLOR   APPearance CLOUDY (A) CLEAR   Specific Gravity, Urine 1.022 1.005 - 1.030   pH 5.5  5.0 - 8.0   Glucose, UA NEGATIVE NEGATIVE mg/dL   Hgb urine dipstick NEGATIVE NEGATIVE   Bilirubin Urine SMALL (A) NEGATIVE   Ketones, ur NEGATIVE NEGATIVE mg/dL   Protein, ur NEGATIVE NEGATIVE mg/dL   Urobilinogen, UA 1.0 0.0 - 1.0 mg/dL   Nitrite NEGATIVE NEGATIVE   Leukocytes, UA NEGATIVE NEGATIVE    Comment: MICROSCOPIC NOT DONE ON URINES WITH NEGATIVE PROTEIN, BLOOD, LEUKOCYTES, NITRITE, OR GLUCOSE <1000 mg/dL.   Ct Abdomen Pelvis W Contrast  05/22/2014   CLINICAL DATA:  Upper abdominal pain and diarrhea for 2 days. Umbilical hernia repair on 05/03/2014.  EXAM: CT ABDOMEN AND PELVIS WITH CONTRAST  TECHNIQUE: Multidetector CT imaging of the abdomen and pelvis was performed using the standard protocol following bolus administration of intravenous contrast.  CONTRAST:  138mL OMNIPAQUE IOHEXOL 300 MG/ML  SOLN  COMPARISON:  Abdominal series 51316.  FINDINGS: Mild dependent changes in the lung bases.  The liver, spleen, gallbladder, pancreas, adrenal glands, kidneys, abdominal aorta, inferior vena cava, and retroperitoneal lymph nodes are unremarkable. Stomach is decompressed. Proximal small bowel are mildly dilated but contrast material flows through to the colon without evidence of bowel obstruction. No bowel wall thickening is appreciated. Partial obstruction is not entirely excluded. No free air or free fluid in the abdomen. There is infiltration along the midline abdominal wall above the umbilicus with small loculated fluid collection with gas bubble measuring about 19 mm diameter. This may represent postoperative change although infection is not excluded. No recurrent hernia is demonstrated.  Pelvis: Small amount of free fluid in the pelvis may be reactive or inflammatory. Uterus appears surgically absent. Bladder is decompressed. No pelvic mass or lymphadenopathy. Appendix is not identified. No destructive bone lesions.  IMPRESSION: Mild dilatation of proximal small bowel without evidence of  obstruction or wall thickening. Infiltration in the subcutaneous fat along the midline above the umbilicus corresponding to postoperative change. Small loculated fluid and gas collection is present. This could represent postoperative collection or abscess. Small amount of free fluid in the pelvis.   Electronically Signed   By: Lucienne Capers M.D.   On: 05/22/2014 05:22   Dg Abd Acute W/chest  05/22/2014   CLINICAL DATA:  Hernia surgery on 04/30. Upper abdominal pain and nausea for 24 hours.  EXAM: DG ABDOMEN ACUTE W/ 1V CHEST  COMPARISON:  Chest 08/20/2005  FINDINGS: Normal heart size and pulmonary vascularity.  Lungs appear clear and expanded. No blunting of costophrenic angles. No pneumothorax.  Paucity of gas in the abdomen with a few air-fluid levels in the mid abdomen and suggestion of bowel wall thickening in the mid abdomen. This is likely to represent small bowel obstruction with predominant fluid-filled bowel. Paucity of gas in the colon. No radiopaque stones. Calcified phleboliths in the pelvis. Mild degenerative changes in the spine.  IMPRESSION: No evidence of active pulmonary disease. Abnormal bowel loops in the mid abdomen air-fluid levels probably representing small bowel obstruction with fluid-filled loops.   Electronically Signed   By: Lucienne Capers M.D.   On: 05/22/2014 01:06    Review of Systems  All other systems reviewed and are negative.   Blood pressure 123/72, pulse 78, temperature 98.1 F (36.7 C), temperature source Oral, resp. rate 18, last menstrual period 04/05/2012, SpO2 97 %. Physical Exam  Constitutional: She is oriented to person, place, and time. She appears well-developed and well-nourished. No distress.  Cardiovascular: Normal rate, regular rhythm, normal heart sounds and intact distal pulses.  Exam reveals no gallop and no friction rub.   No murmur heard. Respiratory: Effort normal and breath sounds normal. No respiratory distress. She has no wheezes. She has  no rales. She exhibits no tenderness.  GI: Soft. Bowel sounds are normal. She exhibits no distension and no mass. There is no tenderness. There is no rebound and no guarding.  Incision is healing well, non tender, no concerns for wound infection/abscess.   Neurological: She is alert and oriented to person, place, and time.  Skin: Skin is warm and dry. No rash noted. She is not diaphoretic. No erythema. No pallor.  Psychiatric: She has a normal mood and affect. Her behavior is normal. Judgment and thought content normal.     Assessment/Plan S/p Laparoscopic exploration with open repair of hernia with sutures only---Dr. Lucia Gaskins 05/04/14 Dehydration Abdominal pain, nausea, vomiting and diarrhea AKI HTN  Her symptoms have resolved.  Certainly could be related to new herbal tea or gastroenteritis.  She does not appear to be obstructed and does not have a wound infection. Admit for IV hydration Allow for clears, if able to tolerate, advance diet SCD/lovenox Antiemetics and pain meds PRN Repeat labs in AM Hold home BP meds.  She needs to f/u with PCP on short term basis to have BP meds adjusted Anticipate discharge tomorrow   Erby Pian ANP-BC 05/22/2014, 7:50 AM

## 2014-05-22 NOTE — ED Notes (Signed)
Went in to assess pt  Pt denies pain at this time  Pain medication held at this time   Family remains at bedside  Waiting on surgeon to come see pt

## 2014-05-23 DIAGNOSIS — R112 Nausea with vomiting, unspecified: Secondary | ICD-10-CM

## 2014-05-23 HISTORY — DX: Nausea with vomiting, unspecified: R11.2

## 2014-05-23 LAB — CBC
HCT: 37.6 % (ref 36.0–46.0)
HEMOGLOBIN: 12 g/dL (ref 12.0–15.0)
MCH: 26.4 pg (ref 26.0–34.0)
MCHC: 31.9 g/dL (ref 30.0–36.0)
MCV: 82.8 fL (ref 78.0–100.0)
PLATELETS: 232 10*3/uL (ref 150–400)
RBC: 4.54 MIL/uL (ref 3.87–5.11)
RDW: 14.2 % (ref 11.5–15.5)
WBC: 3.2 10*3/uL — AB (ref 4.0–10.5)

## 2014-05-23 LAB — BASIC METABOLIC PANEL
ANION GAP: 8 (ref 5–15)
BUN: 13 mg/dL (ref 6–20)
CO2: 21 mmol/L — ABNORMAL LOW (ref 22–32)
Calcium: 8.5 mg/dL — ABNORMAL LOW (ref 8.9–10.3)
Chloride: 111 mmol/L (ref 101–111)
Creatinine, Ser: 0.92 mg/dL (ref 0.44–1.00)
GFR calc non Af Amer: >60 mL/min (ref >60)
Glucose, Bld: 86 mg/dL (ref 65–99)
Potassium: 3.9 mmol/L (ref 3.5–5.1)
Sodium: 140 mmol/L (ref 135–145)

## 2014-05-23 NOTE — Discharge Instructions (Signed)
Keep your appointment with Dr. Lucia Gaskins.  Bland diet.  No heavy lifting or straining.

## 2014-05-23 NOTE — Progress Notes (Signed)
Tolerated lunch with no pain nor nausea or vomiting. Ambulated in hall. Preparing to go home per order.

## 2014-05-23 NOTE — Progress Notes (Signed)
Subjective: Bowels moving.  Tolerating liquids with no nausea or vomiting.  Wants to eat food.  Objective: Vital signs in last 24 hours: Temp:  [97.9 F (36.6 C)-98.2 F (36.8 C)] 97.9 F (36.6 C) (05/14 0523) Pulse Rate:  [68-95] 95 (05/14 0523) Resp:  [16-18] 18 (05/14 0523) BP: (97-130)/(54-71) 115/61 mmHg (05/14 0523) SpO2:  [99 %-100 %] 99 % (05/14 0523) Last BM Date: 05/22/14  Intake/Output from previous day: 05/13 0701 - 05/14 0700 In: 2257.1 [P.O.:1080; I.V.:1177.1] Out: -  Intake/Output this shift:    PE: General- In NAD Abdomen-soft, not tender, epigastric incision is clean and intact with firm area underneath.  Lab Results:   Recent Labs  05/21/14 2256 05/22/14 0132 05/23/14 0430  WBC 5.8  --  3.2*  HGB 14.5 16.3* 12.0  HCT 42.9 48.0* 37.6  PLT 314  --  232   BMET  Recent Labs  05/22/14 0132 05/23/14 0430  NA 139 140  K 3.9 3.9  CL 103 111  CO2  --  21*  GLUCOSE 101* 86  BUN 20 13  CREATININE 1.20* 0.92  CALCIUM  --  8.5*   PT/INR No results for input(s): LABPROT, INR in the last 72 hours. Comprehensive Metabolic Panel:    Component Value Date/Time   NA 140 05/23/2014 0430   NA 139 05/22/2014 0132   K 3.9 05/23/2014 0430   K 3.9 05/22/2014 0132   CL 111 05/23/2014 0430   CL 103 05/22/2014 0132   CO2 21* 05/23/2014 0430   CO2 25 05/06/2014 0544   BUN 13 05/23/2014 0430   BUN 20 05/22/2014 0132   CREATININE 0.92 05/23/2014 0430   CREATININE 1.20* 05/22/2014 0132   CREATININE 1.37* 10/07/2013 1237   CREATININE 1.20* 04/24/2013 1017   GLUCOSE 86 05/23/2014 0430   GLUCOSE 101* 05/22/2014 0132   CALCIUM 8.5* 05/23/2014 0430   CALCIUM 8.8 05/06/2014 0544   AST 22 05/03/2014 1950   AST 18 04/24/2013 1017   ALT 23 05/03/2014 1950   ALT 20 04/24/2013 1017   ALKPHOS 61 05/03/2014 1950   ALKPHOS 59 04/24/2013 1017   BILITOT 0.6 05/03/2014 1950   BILITOT 0.3 04/24/2013 1017   PROT 8.5* 05/03/2014 1950   PROT 7.5 04/24/2013 1017    ALBUMIN 4.4 05/03/2014 1950   ALBUMIN 4.1 04/24/2013 1017     Studies/Results: Ct Abdomen Pelvis W Contrast  05/22/2014   CLINICAL DATA:  Upper abdominal pain and diarrhea for 2 days. Umbilical hernia repair on 05/03/2014.  EXAM: CT ABDOMEN AND PELVIS WITH CONTRAST  TECHNIQUE: Multidetector CT imaging of the abdomen and pelvis was performed using the standard protocol following bolus administration of intravenous contrast.  CONTRAST:  131mL OMNIPAQUE IOHEXOL 300 MG/ML  SOLN  COMPARISON:  Abdominal series 51316.  FINDINGS: Mild dependent changes in the lung bases.  The liver, spleen, gallbladder, pancreas, adrenal glands, kidneys, abdominal aorta, inferior vena cava, and retroperitoneal lymph nodes are unremarkable. Stomach is decompressed. Proximal small bowel are mildly dilated but contrast material flows through to the colon without evidence of bowel obstruction. No bowel wall thickening is appreciated. Partial obstruction is not entirely excluded. No free air or free fluid in the abdomen. There is infiltration along the midline abdominal wall above the umbilicus with small loculated fluid collection with gas bubble measuring about 19 mm diameter. This may represent postoperative change although infection is not excluded. No recurrent hernia is demonstrated.  Pelvis: Small amount of free fluid in the pelvis may be reactive  or inflammatory. Uterus appears surgically absent. Bladder is decompressed. No pelvic mass or lymphadenopathy. Appendix is not identified. No destructive bone lesions.  IMPRESSION: Mild dilatation of proximal small bowel without evidence of obstruction or wall thickening. Infiltration in the subcutaneous fat along the midline above the umbilicus corresponding to postoperative change. Small loculated fluid and gas collection is present. This could represent postoperative collection or abscess. Small amount of free fluid in the pelvis.   Electronically Signed   By: Lucienne Capers M.D.    On: 05/22/2014 05:22   Dg Abd Acute W/chest  05/22/2014   CLINICAL DATA:  Hernia surgery on 04/30. Upper abdominal pain and nausea for 24 hours.  EXAM: DG ABDOMEN ACUTE W/ 1V CHEST  COMPARISON:  Chest 08/20/2005  FINDINGS: Normal heart size and pulmonary vascularity. Lungs appear clear and expanded. No blunting of costophrenic angles. No pneumothorax.  Paucity of gas in the abdomen with a few air-fluid levels in the mid abdomen and suggestion of bowel wall thickening in the mid abdomen. This is likely to represent small bowel obstruction with predominant fluid-filled bowel. Paucity of gas in the colon. No radiopaque stones. Calcified phleboliths in the pelvis. Mild degenerative changes in the spine.  IMPRESSION: No evidence of active pulmonary disease. Abnormal bowel loops in the mid abdomen air-fluid levels probably representing small bowel obstruction with fluid-filled loops.   Electronically Signed   By: Lucienne Capers M.D.   On: 05/22/2014 01:06    Anti-infectives: Anti-infectives    None      Assessment Principal Problem:   Post of nausea, vomiting,diarrhea-she feels much better and all of these symptoms have resolved; no clinical evidence of obstruction.     Plan: Advance to soft diet and if tolerated will discharge today.  Instructions discussed with her.   Nicklos Gaxiola J 05/23/2014

## 2014-05-23 NOTE — Progress Notes (Signed)
Assessment unchanged. Pt verbalized understanding of dc instructions through teach back. No scripts at discharge. Discharged via wc to front entrance to meet awaiting vehicle to carry home. Accompanied by NT and sister.

## 2014-05-24 NOTE — Discharge Summary (Signed)
Physician Discharge Summary  [name] MRN: [id number] DOB/AGE: Aug 13, 1950 64 y.o.  Admit date: 05/21/2014 Discharge date: 05/23/2014  Admission Diagnoses:  Abdominal pain  Discharge Diagnoses:  Principal Problem:   Nausea with vomiting Active Problems:   Abdominal pain   Discharged Condition: good  Hospital Course: she was admitted because of the complaints above. She rapidly got better with some intravenous hydration and bowel rest and started on a diet. She was rapidly advanced on her diet and by 05/23/2014 she is tolerating a diet, bowels were moving, she is able to be discharged. Discharge instructions were given to her. She will keep her follow-up appointment with Dr. Lucia Gaskins.  Discharge Exam: Blood pressure 115/61, pulse 95, temperature 97.9 F (36.6 C), temperature source Oral, resp. rate 18, height 5\' 2"  (1.575 m), weight 83.462 kg (184 lb), last menstrual period 04/05/2012, SpO2 99 %.   Disposition: 01-Home or Self Care     Medication List    STOP taking these medications        traMADol 50 MG tablet  Commonly known as:  ULTRAM      TAKE these medications        acetaminophen 500 MG tablet  Commonly known as:  TYLENOL  Take 1,000 mg by mouth every 6 (six) hours as needed.     carvedilol 25 MG tablet  Commonly known as:  COREG  Take 1 tablet (25 mg total) by mouth 2 (two) times daily with a meal.     furosemide 40 MG tablet  Commonly known as:  LASIX  Take 1 tablet (40 mg total) by mouth daily.     loratadine 10 MG tablet  Commonly known as:  CLARITIN  Take 10 mg by mouth daily as needed for allergies (allergies).     lovastatin 20 MG tablet  Commonly known as:  MEVACOR  Take 1 tablet (20 mg total) by mouth at bedtime.     spironolactone 25 MG tablet  Commonly known as:  ALDACTONE  Take 1 tablet (25 mg total) by mouth daily.     verapamil 120 MG tablet  Commonly known as:  CALAN  TAKE ONE-HALF TABLET BY MOUTH THREE TIMES  DAILY         Signed: Kyandra Mcclaine J 05/24/2014, 3:50 PM

## 2014-05-26 ENCOUNTER — Emergency Department (HOSPITAL_COMMUNITY): Payer: Commercial Managed Care - HMO

## 2014-05-26 ENCOUNTER — Inpatient Hospital Stay (HOSPITAL_COMMUNITY)
Admission: EM | Admit: 2014-05-26 | Discharge: 2014-06-08 | DRG: 348 | Disposition: A | Discharge status: Home / Self Care | Payer: Commercial Managed Care - HMO | Attending: General Surgery | Admitting: General Surgery

## 2014-05-26 ENCOUNTER — Encounter (HOSPITAL_COMMUNITY): Payer: Self-pay | Admitting: Emergency Medicine

## 2014-05-26 DIAGNOSIS — W19XXXA Unspecified fall, initial encounter: Secondary | ICD-10-CM | POA: Diagnosis not present

## 2014-05-26 DIAGNOSIS — Z833 Family history of diabetes mellitus: Secondary | ICD-10-CM | POA: Diagnosis not present

## 2014-05-26 DIAGNOSIS — Z8711 Personal history of peptic ulcer disease: Secondary | ICD-10-CM | POA: Diagnosis not present

## 2014-05-26 DIAGNOSIS — M1990 Unspecified osteoarthritis, unspecified site: Secondary | ICD-10-CM | POA: Diagnosis present

## 2014-05-26 DIAGNOSIS — Z6831 Body mass index (BMI) 31.0-31.9, adult: Secondary | ICD-10-CM | POA: Diagnosis not present

## 2014-05-26 DIAGNOSIS — E669 Obesity, unspecified: Secondary | ICD-10-CM | POA: Diagnosis present

## 2014-05-26 DIAGNOSIS — E785 Hyperlipidemia, unspecified: Secondary | ICD-10-CM | POA: Diagnosis present

## 2014-05-26 DIAGNOSIS — Z90710 Acquired absence of both cervix and uterus: Secondary | ICD-10-CM | POA: Diagnosis not present

## 2014-05-26 DIAGNOSIS — M8580 Other specified disorders of bone density and structure, unspecified site: Secondary | ICD-10-CM | POA: Diagnosis present

## 2014-05-26 DIAGNOSIS — Z79899 Other long term (current) drug therapy: Secondary | ICD-10-CM | POA: Diagnosis not present

## 2014-05-26 DIAGNOSIS — Z808 Family history of malignant neoplasm of other organs or systems: Secondary | ICD-10-CM | POA: Diagnosis not present

## 2014-05-26 DIAGNOSIS — G8918 Other acute postprocedural pain: Secondary | ICD-10-CM

## 2014-05-26 DIAGNOSIS — B9681 Helicobacter pylori [H. pylori] as the cause of diseases classified elsewhere: Secondary | ICD-10-CM | POA: Diagnosis present

## 2014-05-26 DIAGNOSIS — Y92231 Patient bathroom in hospital as the place of occurrence of the external cause: Secondary | ICD-10-CM | POA: Diagnosis not present

## 2014-05-26 DIAGNOSIS — F411 Generalized anxiety disorder: Secondary | ICD-10-CM | POA: Diagnosis present

## 2014-05-26 DIAGNOSIS — Z9102 Food additives allergy status: Secondary | ICD-10-CM | POA: Diagnosis not present

## 2014-05-26 DIAGNOSIS — R1011 Right upper quadrant pain: Secondary | ICD-10-CM

## 2014-05-26 DIAGNOSIS — K259 Gastric ulcer, unspecified as acute or chronic, without hemorrhage or perforation: Secondary | ICD-10-CM | POA: Diagnosis present

## 2014-05-26 DIAGNOSIS — R197 Diarrhea, unspecified: Secondary | ICD-10-CM

## 2014-05-26 DIAGNOSIS — K565 Intestinal adhesions [bands] with obstruction (postprocedural) (postinfection): Secondary | ICD-10-CM | POA: Diagnosis present

## 2014-05-26 DIAGNOSIS — K2960 Other gastritis without bleeding: Secondary | ICD-10-CM | POA: Diagnosis present

## 2014-05-26 DIAGNOSIS — I10 Essential (primary) hypertension: Secondary | ICD-10-CM | POA: Diagnosis present

## 2014-05-26 DIAGNOSIS — R112 Nausea with vomiting, unspecified: Secondary | ICD-10-CM | POA: Diagnosis present

## 2014-05-26 DIAGNOSIS — Z888 Allergy status to other drugs, medicaments and biological substances status: Secondary | ICD-10-CM | POA: Diagnosis not present

## 2014-05-26 DIAGNOSIS — Z885 Allergy status to narcotic agent status: Secondary | ICD-10-CM | POA: Diagnosis not present

## 2014-05-26 DIAGNOSIS — K8020 Calculus of gallbladder without cholecystitis without obstruction: Secondary | ICD-10-CM

## 2014-05-26 DIAGNOSIS — K9189 Other postprocedural complications and disorders of digestive system: Secondary | ICD-10-CM | POA: Diagnosis present

## 2014-05-26 DIAGNOSIS — R109 Unspecified abdominal pain: Secondary | ICD-10-CM

## 2014-05-26 DIAGNOSIS — K2980 Duodenitis without bleeding: Secondary | ICD-10-CM | POA: Diagnosis present

## 2014-05-26 DIAGNOSIS — Z8249 Family history of ischemic heart disease and other diseases of the circulatory system: Secondary | ICD-10-CM | POA: Diagnosis not present

## 2014-05-26 DIAGNOSIS — R1013 Epigastric pain: Secondary | ICD-10-CM

## 2014-05-26 DIAGNOSIS — Z6833 Body mass index (BMI) 33.0-33.9, adult: Secondary | ICD-10-CM | POA: Diagnosis not present

## 2014-05-26 DIAGNOSIS — Z9889 Other specified postprocedural states: Secondary | ICD-10-CM | POA: Diagnosis present

## 2014-05-26 DIAGNOSIS — Y838 Other surgical procedures as the cause of abnormal reaction of the patient, or of later complication, without mention of misadventure at the time of the procedure: Secondary | ICD-10-CM | POA: Diagnosis present

## 2014-05-26 LAB — CBC WITH DIFFERENTIAL/PLATELET
Basophils Absolute: 0 10*3/uL (ref 0.0–0.1)
Basophils Relative: 1 % (ref 0–1)
Eosinophils Absolute: 0.1 10*3/uL (ref 0.0–0.7)
Eosinophils Relative: 4 % (ref 0–5)
HCT: 40.8 % (ref 36.0–46.0)
Hemoglobin: 13.5 g/dL (ref 12.0–15.0)
Lymphocytes Relative: 42 % (ref 12–46)
Lymphs Abs: 1.3 10*3/uL (ref 0.7–4.0)
MCH: 27.1 pg (ref 26.0–34.0)
MCHC: 33.1 g/dL (ref 30.0–36.0)
MCV: 81.8 fL (ref 78.0–100.0)
Monocytes Absolute: 0.4 10*3/uL (ref 0.1–1.0)
Monocytes Relative: 12 % (ref 3–12)
Neutro Abs: 1.3 10*3/uL — ABNORMAL LOW (ref 1.7–7.7)
Neutrophils Relative %: 41 % — ABNORMAL LOW (ref 43–77)
Platelets: 225 10*3/uL (ref 150–400)
RBC: 4.99 MIL/uL (ref 3.87–5.11)
RDW: 14 % (ref 11.5–15.5)
WBC: 3.1 10*3/uL — ABNORMAL LOW (ref 4.0–10.5)

## 2014-05-26 LAB — URINALYSIS, ROUTINE W REFLEX MICROSCOPIC
Glucose, UA: NEGATIVE mg/dL
Hgb urine dipstick: NEGATIVE
Ketones, ur: 15 mg/dL — AB
Leukocytes, UA: NEGATIVE
Nitrite: NEGATIVE
Protein, ur: NEGATIVE mg/dL
Specific Gravity, Urine: 1.043 — ABNORMAL HIGH (ref 1.005–1.030)
Urobilinogen, UA: 1 mg/dL (ref 0.0–1.0)
pH: 5.5 (ref 5.0–8.0)

## 2014-05-26 LAB — COMPREHENSIVE METABOLIC PANEL
ALT: 23 U/L (ref 14–54)
AST: 20 U/L (ref 15–41)
Albumin: 3.4 g/dL — ABNORMAL LOW (ref 3.5–5.0)
Alkaline Phosphatase: 47 U/L (ref 38–126)
Anion gap: 12 (ref 5–15)
BUN: 18 mg/dL (ref 6–20)
CO2: 20 mmol/L — ABNORMAL LOW (ref 22–32)
Calcium: 8.9 mg/dL (ref 8.9–10.3)
Chloride: 107 mmol/L (ref 101–111)
Creatinine, Ser: 1.1 mg/dL — ABNORMAL HIGH (ref 0.44–1.00)
GFR calc Af Amer: 60 mL/min — ABNORMAL LOW (ref >60)
GFR calc non Af Amer: 52 mL/min — ABNORMAL LOW (ref >60)
Glucose, Bld: 74 mg/dL (ref 65–99)
Potassium: 3.3 mmol/L — ABNORMAL LOW (ref 3.5–5.1)
Sodium: 139 mmol/L (ref 135–145)
Total Bilirubin: 0.7 mg/dL (ref 0.3–1.2)
Total Protein: 6.7 g/dL (ref 6.5–8.1)

## 2014-05-26 LAB — LIPASE, BLOOD: Lipase: 20 U/L — ABNORMAL LOW (ref 22–51)

## 2014-05-26 MED ORDER — ENOXAPARIN SODIUM 40 MG/0.4ML ~~LOC~~ SOLN
40.0000 mg | SUBCUTANEOUS | Status: DC
  Administered 2014-05-26 – 2014-06-02 (×7): 40 mg via SUBCUTANEOUS
  Filled 2014-05-26 (×9): qty 0.4

## 2014-05-26 MED ORDER — MORPHINE SULFATE 2 MG/ML IJ SOLN
2.0000 mg | INTRAMUSCULAR | Status: DC | PRN
  Administered 2014-05-26 – 2014-06-04 (×19): 2 mg via INTRAVENOUS
  Filled 2014-05-26 (×19): qty 1

## 2014-05-26 MED ORDER — VERAPAMIL HCL 120 MG PO TABS
60.0000 mg | ORAL_TABLET | Freq: Three times a day (TID) | ORAL | Status: DC
  Administered 2014-05-26 – 2014-06-08 (×36): 60 mg via ORAL
  Filled 2014-05-26 (×41): qty 0.5

## 2014-05-26 MED ORDER — OXYCODONE HCL 5 MG PO TABS
5.0000 mg | ORAL_TABLET | ORAL | Status: DC | PRN
  Administered 2014-05-28 – 2014-06-07 (×7): 5 mg via ORAL
  Filled 2014-05-26 (×8): qty 1

## 2014-05-26 MED ORDER — ACETAMINOPHEN 500 MG PO TABS
1000.0000 mg | ORAL_TABLET | Freq: Four times a day (QID) | ORAL | Status: DC | PRN
  Administered 2014-06-03 (×2): 1000 mg via ORAL
  Filled 2014-05-26 (×2): qty 2

## 2014-05-26 MED ORDER — PRAVASTATIN SODIUM 20 MG PO TABS
20.0000 mg | ORAL_TABLET | Freq: Every day | ORAL | Status: DC
  Administered 2014-05-27 – 2014-06-07 (×12): 20 mg via ORAL
  Filled 2014-05-26 (×13): qty 1

## 2014-05-26 MED ORDER — ONDANSETRON HCL 4 MG/2ML IJ SOLN
4.0000 mg | Freq: Four times a day (QID) | INTRAMUSCULAR | Status: DC | PRN
  Administered 2014-05-28 – 2014-06-04 (×4): 4 mg via INTRAVENOUS
  Filled 2014-05-26 (×3): qty 2

## 2014-05-26 MED ORDER — LORATADINE 10 MG PO TABS
10.0000 mg | ORAL_TABLET | Freq: Every day | ORAL | Status: DC | PRN
  Filled 2014-05-26: qty 1

## 2014-05-26 MED ORDER — KCL IN DEXTROSE-NACL 20-5-0.45 MEQ/L-%-% IV SOLN
INTRAVENOUS | Status: DC
  Administered 2014-05-26 – 2014-05-27 (×2): via INTRAVENOUS
  Administered 2014-05-27: 75 mL/h via INTRAVENOUS
  Administered 2014-05-28 – 2014-05-31 (×4): via INTRAVENOUS
  Filled 2014-05-26 (×18): qty 1000

## 2014-05-26 MED ORDER — FUROSEMIDE 40 MG PO TABS
40.0000 mg | ORAL_TABLET | Freq: Every day | ORAL | Status: DC
  Administered 2014-05-27 – 2014-06-08 (×11): 40 mg via ORAL
  Filled 2014-05-26 (×14): qty 1

## 2014-05-26 MED ORDER — SPIRONOLACTONE 25 MG PO TABS
25.0000 mg | ORAL_TABLET | Freq: Every day | ORAL | Status: DC
  Administered 2014-05-26 – 2014-06-08 (×12): 25 mg via ORAL
  Filled 2014-05-26 (×16): qty 1

## 2014-05-26 MED ORDER — SODIUM CHLORIDE 0.9 % IV BOLUS (SEPSIS)
1000.0000 mL | Freq: Once | INTRAVENOUS | Status: AC
  Administered 2014-05-26: 1000 mL via INTRAVENOUS

## 2014-05-26 MED ORDER — CARVEDILOL 25 MG PO TABS
25.0000 mg | ORAL_TABLET | Freq: Two times a day (BID) | ORAL | Status: DC
  Administered 2014-05-27 – 2014-06-08 (×25): 25 mg via ORAL
  Filled 2014-05-26 (×27): qty 1

## 2014-05-26 MED ORDER — PANTOPRAZOLE SODIUM 40 MG IV SOLR
40.0000 mg | Freq: Every day | INTRAVENOUS | Status: DC
  Administered 2014-05-26 – 2014-05-27 (×2): 40 mg via INTRAVENOUS
  Filled 2014-05-26 (×3): qty 40

## 2014-05-26 NOTE — ED Notes (Signed)
RN Rose Fillers is starting IV, will get labs

## 2014-05-26 NOTE — ED Provider Notes (Signed)
Patient presented to the ER with nausea and vomiting. Patient had herniorrhaphy performed 3 weeks ago. She has had intermittent nausea and vomiting since. She reports being hospitalized for similar symptoms last week.  Face to face Exam: HEENT - PERRLA Lungs - CTAB Heart - RRR, no M/R/G Abd - S/NT/ND Neuro - alert, oriented x3  Plan: Nausea, vomiting, inability to eat and drink. Abdominal exam is benign and nontender. Recently hospitalized by surgery. Will ask for surgical consultation. X-ray does not suggest obstruction.  Orpah Greek, MD 05/26/14 [phone]

## 2014-05-26 NOTE — ED Notes (Signed)
Pt left the hospital Saturday after being admitted for nausea/abd pain following a hernia surgery. States she hasn't been able to keep down any food since. Called her PCP this morning and he recommended she come to the ER. Denies abdominal pain.

## 2014-05-26 NOTE — ED Notes (Signed)
Pt states she has already been stuck and they were unable to collect the blood. Pt requesting to finish her liter of fluids before trying to obtain bloodwork.

## 2014-05-26 NOTE — ED Notes (Signed)
Unable to obtain lab unsuccessfully. Per PA Hilda Blades, will obtain labs after fluid

## 2014-05-26 NOTE — H&P (Signed)
Michelle Hurley is an 64 y.o. female.   Chief Complaint: nausea and vomiting, abdominal pain, postoperative   HPI: patient is approximately 6 weeks following emergency repair of an incarcerated periumbilical hernia. At that time she did have strangulated bowel but it appeared healthy after release and no bowel resection was necessary. The patient was discharged after several days doing well. However last week she developed worsening epigastric pain and postprandial nausea and vomiting. The patient was readmitted. CT scan at that time showed some slightly dilated loops of bowel consistent with ileus or possible early partial small bowel obstruction. She however quickly improved during hospitalization and was discharged just 2 days ago feeling well and tolerating a diet. She states that however almost immediately after getting home her symptoms recurred. She describes sharp epigastric pain intermittently worse with eating but particularly persistent nausea and vomiting when trying to eat and unable to keep anything down. She has developed some diarrhea in the last 24 hours which is a new symptom. No fever or chills or distention. No urinary symptoms.  Past Medical History  Diagnosis Date  . Hypertension   . Myopia   . Anxiety state, unspecified   . Morbid obesity   . Other and unspecified hyperlipidemia   . Unspecified essential hypertension   . Osteopenia   . Umbilical hernia   . Palpitations   . Arthritis   . H/O: hysterectomy   . NSVT (nonsustained ventricular tachycardia)     a. 01/2005 Echo: EF 55-65%    Past Surgical History  Procedure Laterality Date  . Laparoscopic transabdominal hernia N/A 05/03/2014    Procedure: LAPAROSCOPIC EXPLORATION WITH OPEN REPAIR OF INCARCERATED EPIGASTRIC HERNIA ;  Surgeon: Alphonsa Overall, MD;  Location: [facility];  Service: General;  Laterality: N/A;  . Abdominal hysterectomy  1974    Family History  Problem Relation Age of Onset  . Heart disease Mother    . Diabetes Mother   . Thyroid cancer Sister   . Diabetes Sister   . Hypertension Brother    Social History:  reports that she has never smoked. She has never used smokeless tobacco. She reports that she does not drink alcohol or use illicit drugs.  Allergies:  Allergies  Allergen Reactions  . Mushroom Extract Complex Anaphylaxis, Itching and Swelling    Swelling all over body   . Codeine Nausea And Vomiting  . Hydrocodone Nausea And Vomiting  . Codeine Nausea And Vomiting  . Prednisone Nausea And Vomiting     No current facility-administered medications for this encounter.   Current Outpatient Prescriptions  Medication Sig Dispense Refill  . acetaminophen (TYLENOL) 500 MG tablet Take 1,000 mg by mouth every 6 (six) hours as needed for mild pain or moderate pain.     . carvedilol (COREG) 25 MG tablet Take 1 tablet (25 mg total) by mouth 2 (two) times daily with a meal. 180 tablet 3  . furosemide (LASIX) 40 MG tablet Take 1 tablet (40 mg total) by mouth daily. 90 tablet 3  . loratadine (CLARITIN) 10 MG tablet Take 10 mg by mouth daily as needed for allergies (allergies).    Marland Kitchen lovastatin (MEVACOR) 20 MG tablet Take 1 tablet (20 mg total) by mouth at bedtime. 90 tablet 3  . spironolactone (ALDACTONE) 25 MG tablet Take 1 tablet (25 mg total) by mouth daily. 90 tablet 3  . verapamil (CALAN) 120 MG tablet TAKE ONE-HALF TABLET BY MOUTH THREE TIMES DAILY (Patient taking differently: Take 60 mg by mouth  3 (three) times daily. ) 90 tablet 3     Results for orders placed or performed during the hospital encounter of 05/26/14 (from the past 48 hour(s))  Urinalysis, Routine w reflex microscopic     Status: Abnormal   Collection Time: 05/26/14  1:37 PM  Result Value Ref Range   Color, Urine AMBER (A) YELLOW    Comment: BIOCHEMICALS MAY BE AFFECTED BY COLOR   APPearance CLEAR CLEAR   Specific Gravity, Urine 1.043 (H) 1.005 - 1.030   pH 5.5 5.0 - 8.0   Glucose, UA NEGATIVE NEGATIVE mg/dL    Hgb urine dipstick NEGATIVE NEGATIVE   Bilirubin Urine MODERATE (A) NEGATIVE   Ketones, ur 15 (A) NEGATIVE mg/dL   Protein, ur NEGATIVE NEGATIVE mg/dL   Urobilinogen, UA 1.0 0.0 - 1.0 mg/dL   Nitrite NEGATIVE NEGATIVE   Leukocytes, UA NEGATIVE NEGATIVE    Comment: MICROSCOPIC NOT DONE ON URINES WITH NEGATIVE PROTEIN, BLOOD, LEUKOCYTES, NITRITE, OR GLUCOSE <1000 mg/dL.  CBC with Differential     Status: Abnormal   Collection Time: 05/26/14  4:16 PM  Result Value Ref Range   WBC 3.1 (L) 4.0 - 10.5 K/uL   RBC 4.99 3.87 - 5.11 MIL/uL   Hemoglobin 13.5 12.0 - 15.0 g/dL   HCT 40.8 36.0 - 46.0 %   MCV 81.8 78.0 - 100.0 fL   MCH 27.1 26.0 - 34.0 pg   MCHC 33.1 30.0 - 36.0 g/dL   RDW 14.0 11.5 - 15.5 %   Platelets 225 150 - 400 K/uL   Neutrophils Relative % 41 (L) 43 - 77 %   Neutro Abs 1.3 (L) 1.7 - 7.7 K/uL   Lymphocytes Relative 42 12 - 46 %   Lymphs Abs 1.3 0.7 - 4.0 K/uL   Monocytes Relative 12 3 - 12 %   Monocytes Absolute 0.4 0.1 - 1.0 K/uL   Eosinophils Relative 4 0 - 5 %   Eosinophils Absolute 0.1 0.0 - 0.7 K/uL   Basophils Relative 1 0 - 1 %   Basophils Absolute 0.0 0.0 - 0.1 K/uL   Dg Abd Acute W/chest  05/26/2014   CLINICAL DATA:  Persistent vomiting and nausea.  EXAM: DG ABDOMEN ACUTE W/ 1V CHEST  COMPARISON:  05/22/2014  FINDINGS: There is minimal distention of 2 small bowel loops in the left mid abdomen. This is unchanged. Bowel gas pattern is otherwise normal. No free air or free fluid. Heart and lungs appear normal. No osseous abnormality.  IMPRESSION: Minimal nonspecific distention of 2 small bowel loops in the left mid abdomen, unchanged. The finding is not suggestive of small bowel obstruction.   Electronically Signed   By: Lorriane Shire M.D.   On: 05/26/2014 15:12    Review of Systems  Constitutional: Negative for fever and chills.  Respiratory: Negative for cough, sputum production and shortness of breath.   Cardiovascular: Negative for chest pain and  palpitations.  Gastrointestinal: Positive for nausea, vomiting, abdominal pain and diarrhea. Negative for constipation and blood in stool.  Genitourinary: Negative.     Blood pressure 142/82, pulse 77, temperature 98 F (36.7 C), temperature source Oral, resp. rate 17, last menstrual period 04/05/2012, SpO2 100 %. Physical Exam  General: Obese pleasant Afro-American female who does not appear in distress Skin: No rash or infection Lungs: Clear breath sounds without wheezing or increased work of breathing Cardiac: Regular rate and rhythm. No edema Abdomen: Well-healed small central abdominal incision. This is nontender and no evidence of infection or  seroma. She is mildly tender in her epigastrium without guarding. This is well away from the incision. No particular distention noted. Extremities: No edema or joint swelling Neurologic: Alert and fully oriented. No gross motor deficits.  Assessment/Plan Recurrent epigastric pain, nausea and vomiting now about 6 weeks following an apparently uncomplicated repair of incarcerated ventral hernia. She did have incarcerated small bowel but it appeared certainly viable at the time of surgery per the operative note and this would be quite some time after to develop small bowel complications secondary to this. Her pain is actually high in the epigastrium well away from her incision. Possibilities could include a partial small bowel obstruction or possibly unrelated diagnosis such as cholelithiasis. Patient feels she is unable to go home due to persistent inability to tolerate by mouth intake. I will admit for IV hydration and symptom control. Check gallbladder ultrasound to rule out symptomatic cholelithiasis. Could require upper endoscopy if symptoms persist. She just had a CT scan a week ago which was essentially unremarkable and I do not think this needs to be repeated.  Keevin Panebianco T 05/26/2014, 5:05 PM

## 2014-05-26 NOTE — ED Notes (Signed)
Unable to obtain blood work. PA made aware and states to give fluids and then retry.

## 2014-05-26 NOTE — ED Provider Notes (Signed)
Patient received in sign out from PA [place] at shift change.  Briefly, 64 y.o. F s/p hernia repair several weeks ago here with N/V/D.  She has had multiple hospital visits and admission after repair due to the same.  Plain films today without evidence of SBO.  Plan:  Lab work pending.  General surgery to evaluate-- dispo per their recommendations.  Results for orders placed or performed during the hospital encounter of 05/26/14  CBC with Differential  Result Value Ref Range   WBC 3.1 (L) 4.0 - 10.5 K/uL   RBC 4.99 3.87 - 5.11 MIL/uL   Hemoglobin 13.5 12.0 - 15.0 g/dL   HCT 40.8 36.0 - 46.0 %   MCV 81.8 78.0 - 100.0 fL   MCH 27.1 26.0 - 34.0 pg   MCHC 33.1 30.0 - 36.0 g/dL   RDW 14.0 11.5 - 15.5 %   Platelets 225 150 - 400 K/uL   Neutrophils Relative % 41 (L) 43 - 77 %   Neutro Abs 1.3 (L) 1.7 - 7.7 K/uL   Lymphocytes Relative 42 12 - 46 %   Lymphs Abs 1.3 0.7 - 4.0 K/uL   Monocytes Relative 12 3 - 12 %   Monocytes Absolute 0.4 0.1 - 1.0 K/uL   Eosinophils Relative 4 0 - 5 %   Eosinophils Absolute 0.1 0.0 - 0.7 K/uL   Basophils Relative 1 0 - 1 %   Basophils Absolute 0.0 0.0 - 0.1 K/uL  Comprehensive metabolic panel  Result Value Ref Range   Sodium 139 135 - 145 mmol/L   Potassium 3.3 (L) 3.5 - 5.1 mmol/L   Chloride 107 101 - 111 mmol/L   CO2 20 (L) 22 - 32 mmol/L   Glucose, Bld 74 65 - 99 mg/dL   BUN 18 6 - 20 mg/dL   Creatinine, Ser 1.10 (H) 0.44 - 1.00 mg/dL   Calcium 8.9 8.9 - 10.3 mg/dL   Total Protein 6.7 6.5 - 8.1 g/dL   Albumin 3.4 (L) 3.5 - 5.0 g/dL   AST 20 15 - 41 U/L   ALT 23 14 - 54 U/L   Alkaline Phosphatase 47 38 - 126 U/L   Total Bilirubin 0.7 0.3 - 1.2 mg/dL   GFR calc non Af Amer 52 (L) >60 mL/min   GFR calc Af Amer 60 (L) >60 mL/min   Anion gap 12 5 - 15  Lipase, blood  Result Value Ref Range   Lipase 20 (L) 22 - 51 U/L  Urinalysis, Routine w reflex microscopic  Result Value Ref Range   Color, Urine AMBER (A) YELLOW   APPearance CLEAR CLEAR   Specific Gravity, Urine 1.043 (H) 1.005 - 1.030   pH 5.5 5.0 - 8.0   Glucose, UA NEGATIVE NEGATIVE mg/dL   Hgb urine dipstick NEGATIVE NEGATIVE   Bilirubin Urine MODERATE (A) NEGATIVE   Ketones, ur 15 (A) NEGATIVE mg/dL   Protein, ur NEGATIVE NEGATIVE mg/dL   Urobilinogen, UA 1.0 0.0 - 1.0 mg/dL   Nitrite NEGATIVE NEGATIVE   Leukocytes, UA NEGATIVE NEGATIVE    Dg Abd Acute W/chest  05/26/2014   CLINICAL DATA:  Persistent vomiting and nausea.  EXAM: DG ABDOMEN ACUTE W/ 1V CHEST  COMPARISON:  05/22/2014  FINDINGS: There is minimal distention of 2 small bowel loops in the left mid abdomen. This is unchanged. Bowel gas pattern is otherwise normal. No free air or free fluid. Heart and lungs appear normal. No osseous abnormality.  IMPRESSION: Minimal nonspecific distention of 2 small  bowel loops in the left mid abdomen, unchanged. The finding is not suggestive of small bowel obstruction.   Electronically Signed   By: Lorriane Shire M.D.   On: 05/26/2014 15:12    General surgery has evaluated patient and will admit for symptom control.  Larene Pickett, PA-C 05/26/14 Esparto Yao, MD 05/26/14 [phone]

## 2014-05-26 NOTE — ED Notes (Signed)
Pt provided with ginger ale and crackers and peanut butter.

## 2014-05-26 NOTE — ED Provider Notes (Signed)
CSN: [id number]     Arrival date & time 05/26/14  1220 History   First MD Initiated Contact with Patient 05/26/14 1325     Chief Complaint  Patient presents with  . Emesis  . Post-op Problem  . Nausea     (Consider location/radiation/quality/duration/timing/severity/associated sxs/prior Treatment) HPI Pt is a 64yo female with hx of HTN, and recently repaired umbilical hernia.  Pt was admitted for repair of incarcerated hernia on 05/03/14 by Dr. Alphonsa Overall, then admitted from 05/21/14 through 05/23/14, for c/o abdominal pain nausea and vomiting.  Pt states since discharge, pt has been trying to eat small meals and had been doing well until last night. Pt reports having yogurt and chicken broth but by 8PM pt became nauseated and vomited from 10PM-4AM. Pt does not recall number of episodes of vomiting. Pt reports emesis contained stomach contents. Denies blood or bile in emesis.  Pt also reports about 5 episodes of watery diarrhea within last 12 hours. Denies blood or mucous in stool. Denies urinary symptoms. Denies abdominal pain, fever, or chills. Pt called her PCP this morning who advised her to come to ED for further evaluation of possible post-op complication.    Past Medical History  Diagnosis Date  . Hypertension   . Myopia   . Anxiety state, unspecified   . Morbid obesity   . Other and unspecified hyperlipidemia   . Unspecified essential hypertension   . Osteopenia   . Umbilical hernia   . Palpitations   . Arthritis   . H/O: hysterectomy   . NSVT (nonsustained ventricular tachycardia)     a. 01/2005 Echo: EF 55-65%   Past Surgical History  Procedure Laterality Date  . Laparoscopic transabdominal hernia N/A 05/03/2014    Procedure: LAPAROSCOPIC EXPLORATION WITH OPEN REPAIR OF INCARCERATED EPIGASTRIC HERNIA ;  Surgeon: Alphonsa Overall, MD;  Location: [facility];  Service: General;  Laterality: N/A;  . Abdominal hysterectomy  1974   Family History  Problem Relation Age of Onset  .  Heart disease Mother   . Diabetes Mother   . Thyroid cancer Sister   . Diabetes Sister   . Hypertension Brother    History  Substance Use Topics  . Smoking status: Never Smoker   . Smokeless tobacco: Never Used  . Alcohol Use: No   OB History    Gravida Para Term Preterm AB TAB SAB Ectopic Multiple Living   0 0 0 0 0 0 0 0       Review of Systems  Constitutional: Negative for fever, chills and appetite change.  Respiratory: Negative for cough and shortness of breath.   Cardiovascular: Negative for chest pain and palpitations.  Gastrointestinal: Positive for nausea, vomiting and diarrhea. Negative for abdominal pain.  Genitourinary: Negative for dysuria, urgency, frequency, hematuria, flank pain and pelvic pain.  All other systems reviewed and are negative.     Allergies  Mushroom extract complex; Codeine; Hydrocodone; Codeine; and Prednisone  Home Medications   Prior to Admission medications   Medication Sig Start Date End Date Taking? Authorizing Provider  acetaminophen (TYLENOL) 500 MG tablet Take 1,000 mg by mouth every 6 (six) hours as needed for mild pain or moderate pain.    Yes Historical Provider, MD  carvedilol (COREG) 25 MG tablet Take 1 tablet (25 mg total) by mouth 2 (two) times daily with a meal. 05/11/14  Yes Olugbemiga E Doreene Burke, MD  furosemide (LASIX) 40 MG tablet Take 1 tablet (40 mg total) by mouth daily. 05/11/14  Yes Tresa Garter, MD  loratadine (CLARITIN) 10 MG tablet Take 10 mg by mouth daily as needed for allergies (allergies).   Yes Historical Provider, MD  lovastatin (MEVACOR) 20 MG tablet Take 1 tablet (20 mg total) by mouth at bedtime. 05/11/14  Yes Tresa Garter, MD  spironolactone (ALDACTONE) 25 MG tablet Take 1 tablet (25 mg total) by mouth daily. 05/11/14  Yes Tresa Garter, MD  verapamil (CALAN) 120 MG tablet TAKE ONE-HALF TABLET BY MOUTH THREE TIMES DAILY Patient taking differently: Take 60 mg by mouth 3 (three) times daily.  05/11/14   Yes Olugbemiga E Doreene Burke, MD   BP 142/82 mmHg  Pulse 77  Temp(Src) 98 F (36.7 C) (Oral)  Resp 17  SpO2 100%  LMP 04/05/2012 Physical Exam  Constitutional: She appears well-developed and well-nourished. No distress.  Pt lying in exam bed, NAD. Non-toxic appearing.  HENT:  Head: Normocephalic and atraumatic.  Mouth/Throat: Oropharynx is clear and moist.  Eyes: Conjunctivae are normal. No scleral icterus.  Neck: Normal range of motion. Neck supple.  Cardiovascular: Normal rate, regular rhythm and normal heart sounds.   Pulmonary/Chest: Effort normal and breath sounds normal. No respiratory distress. She has no wheezes. She has no rales. She exhibits no tenderness.  Abdominal: Soft. Bowel sounds are normal. She exhibits no distension and no mass. There is no tenderness. There is no rebound, no guarding and no CVA tenderness.  Well healing surgical incisions, mild ecchymosis just superior to umbilicus. No erythema or warmth. No bleeding or discharge. Abdomen is soft, non-distended, non-tender. Non-tender even around surgical sites.  Musculoskeletal: Normal range of motion.  Neurological: She is alert.  Skin: Skin is warm and dry. She is not diaphoretic.  Nursing note and vitals reviewed.   ED Course  Procedures (including critical care time) Labs Review Labs Reviewed  URINALYSIS, ROUTINE W REFLEX MICROSCOPIC - Abnormal; Notable for the following:    Color, Urine AMBER (*)    Specific Gravity, Urine 1.043 (*)    Bilirubin Urine MODERATE (*)    Ketones, ur 15 (*)    All other components within normal limits  CBC WITH DIFFERENTIAL/PLATELET  COMPREHENSIVE METABOLIC PANEL  LIPASE, BLOOD    Imaging Review Dg Abd Acute W/chest  05/26/2014   CLINICAL DATA:  Persistent vomiting and nausea.  EXAM: DG ABDOMEN ACUTE W/ 1V CHEST  COMPARISON:  05/22/2014  FINDINGS: There is minimal distention of 2 small bowel loops in the left mid abdomen. This is unchanged. Bowel gas pattern is otherwise  normal. No free air or free fluid. Heart and lungs appear normal. No osseous abnormality.  IMPRESSION: Minimal nonspecific distention of 2 small bowel loops in the left mid abdomen, unchanged. The finding is not suggestive of small bowel obstruction.   Electronically Signed   By: Lorriane Shire M.D.   On: 05/26/2014 15:12     EKG Interpretation None      MDM   Final diagnoses:  Nausea vomiting and diarrhea    Pt is a 64yo female presenting to ED with c/o n/v/d that started last night. Per medical records pt had an umbilical hernia repair on 05/03/14, then admitted 5/12-5/14 for abdominal pain, nausea and vomiting. Pt reports being advised to eat small meals, which she has been doing. She had improvement in symptoms until last night when she vomited "all night" as well as had 5 episodes of watery diarrhea. No fever or chills. Denies abdominal pain or urinary symptoms. Pt appears well, moist mucous membranes.  Abdomen is soft, non-tender. Surgical site appears to be healing well, no signs of infection.  Pt is afebrile, BP 132/101, states she did not take BP meds today. IV fluids started as pt is a difficult IV stick, phlebotomy was unable to draw blood at placement of IV.  Will re-attempt after IV fluids.  BP improved to 142/82.    3:28 PM Labs pending. DG Abd Acute w/ Chest: minimal nonspecific distention of 2 small bowel loops in Left mid abdomen, unchanged. NOT suggestive of small bowel obstruction.  Will consult with general surgery.    3:47 PM Consulted with general surgery who reviewed pt's medical records and imaging. Extremely low concern for post-op problem causing pt's n/v/d.  Recommends medical admission for symptomatic control if unable to keep down PO fluids. No additional imaging recommended unless labs significantly abnormal.   3:54 PM Pt requesting something to eat. Pt states she is about to "starve" will give PO challenge with oral fluids and crackers. Phlebotomy in room getting  blood. Pt signed out to Quincy Carnes, PA-C at shift change. Plan is for pt to be discharged home to f/u with PCP and GI for further evaluation of n/v/d as long as pt can keep down PO fluids and labs unremarkable.      Noland Fordyce, PA-C 05/26/14 [place], MD 05/26/14 [phone]

## 2014-05-27 ENCOUNTER — Inpatient Hospital Stay (HOSPITAL_COMMUNITY): Payer: Commercial Managed Care - HMO

## 2014-05-27 LAB — CBC
HEMATOCRIT: 36.5 % (ref 36.0–46.0)
Hemoglobin: 11.7 g/dL — ABNORMAL LOW (ref 12.0–15.0)
MCH: 26.1 pg (ref 26.0–34.0)
MCHC: 32.1 g/dL (ref 30.0–36.0)
MCV: 81.5 fL (ref 78.0–100.0)
Platelets: 212 10*3/uL (ref 150–400)
RBC: 4.48 MIL/uL (ref 3.87–5.11)
RDW: 14.2 % (ref 11.5–15.5)
WBC: 3.2 10*3/uL — ABNORMAL LOW (ref 4.0–10.5)

## 2014-05-27 LAB — BASIC METABOLIC PANEL
ANION GAP: 10 (ref 5–15)
BUN: 16 mg/dL (ref 6–20)
CO2: 20 mmol/L — ABNORMAL LOW (ref 22–32)
CREATININE: 0.95 mg/dL (ref 0.44–1.00)
Calcium: 8.6 mg/dL — ABNORMAL LOW (ref 8.9–10.3)
Chloride: 106 mmol/L (ref 101–111)
GFR calc non Af Amer: >60 mL/min (ref >60)
Glucose, Bld: 100 mg/dL — ABNORMAL HIGH (ref 65–99)
Potassium: 3.2 mmol/L — ABNORMAL LOW (ref 3.5–5.1)
Sodium: 136 mmol/L (ref 135–145)

## 2014-05-27 MED ORDER — POTASSIUM CHLORIDE CRYS ER 20 MEQ PO TBCR
40.0000 meq | EXTENDED_RELEASE_TABLET | Freq: Once | ORAL | Status: AC
  Administered 2014-05-27: 40 meq via ORAL
  Filled 2014-05-27: qty 2

## 2014-05-27 NOTE — Progress Notes (Signed)
[name], female   DOB: 1950/09/29, 64 y.o.   MRN: [id number]     [facility]      [address] [place], [place] [address]    Phone: [phone] FAX: [phone]     Subjective: Had pain after eating clears and crackers/peanut butter yesterday.  VSS.  Afebrile.  WBC normal.  Passing flatus.  Voiding.   Objective:  Vital signs:  Filed Vitals:   05/26/14 1825 05/26/14 2102 05/27/14 0215 05/27/14 0543  BP:  139/85 124/88 127/83  Pulse:  79  74  Temp:  98.2 F (36.8 C)  98.1 F (36.7 C)  TempSrc:  Oral  Oral  Resp:  20  20  Height: '5\' 2"'  (1.575 m)     Weight: 83 kg (182 lb 15.7 oz)     SpO2:  100% 98% 100%    Last BM Date: 05/26/14  Intake/Output   Yesterday:  05/17 0701 - 05/18 0700 In: 971.3 [P.O.:240; I.V.:731.3] Out: -  This shift: I/O last 3 completed shifts: In: 971.3 [P.O.:240; I.V.:731.3] Out: -      Physical Exam: General: Pt awake/alert/oriented x4 in no acute distress Abdomen: Soft.  Nondistended.  Non tender.  Midline incision is healing well with approximated wound edges, some dermabond remains..  No evidence of peritonitis.  No incarcerated hernias.    Problem List:   Active Problems:   Postoperative nausea and vomiting    Results:   Labs: Results for orders placed or performed during the hospital encounter of 05/26/14 (from the past 48 hour(s))  Urinalysis, Routine w reflex microscopic     Status: Abnormal   Collection Time: 05/26/14  1:37 PM  Result Value Ref Range   Color, Urine AMBER (A) YELLOW    Comment: BIOCHEMICALS MAY BE AFFECTED BY COLOR   APPearance CLEAR CLEAR   Specific Gravity, Urine 1.043 (H) 1.005 - 1.030   pH 5.5 5.0 - 8.0   Glucose, UA NEGATIVE NEGATIVE mg/dL   Hgb urine dipstick NEGATIVE NEGATIVE   Bilirubin Urine MODERATE (A) NEGATIVE   Ketones, ur 15 (A) NEGATIVE mg/dL   Protein, ur NEGATIVE NEGATIVE mg/dL   Urobilinogen, UA 1.0 0.0 - 1.0 mg/dL    Nitrite NEGATIVE NEGATIVE   Leukocytes, UA NEGATIVE NEGATIVE    Comment: MICROSCOPIC NOT DONE ON URINES WITH NEGATIVE PROTEIN, BLOOD, LEUKOCYTES, NITRITE, OR GLUCOSE <1000 mg/dL.  CBC with Differential     Status: Abnormal   Collection Time: 05/26/14  4:16 PM  Result Value Ref Range   WBC 3.1 (L) 4.0 - 10.5 K/uL   RBC 4.99 3.87 - 5.11 MIL/uL   Hemoglobin 13.5 12.0 - 15.0 g/dL   HCT 40.8 36.0 - 46.0 %   MCV 81.8 78.0 - 100.0 fL   MCH 27.1 26.0 - 34.0 pg   MCHC 33.1 30.0 - 36.0 g/dL   RDW 14.0 11.5 - 15.5 %   Platelets 225 150 - 400 K/uL   Neutrophils Relative % 41 (L) 43 - 77 %   Neutro Abs 1.3 (L) 1.7 - 7.7 K/uL   Lymphocytes Relative 42 12 - 46 %   Lymphs Abs 1.3 0.7 - 4.0 K/uL   Monocytes Relative 12 3 - 12 %   Monocytes Absolute 0.4 0.1 - 1.0 K/uL   Eosinophils Relative 4 0 - 5 %   Eosinophils Absolute 0.1 0.0 - 0.7 K/uL   Basophils Relative 1 0 - 1 %   Basophils Absolute 0.0  0.0 - 0.1 K/uL  Comprehensive metabolic panel     Status: Abnormal   Collection Time: 05/26/14  4:16 PM  Result Value Ref Range   Sodium 139 135 - 145 mmol/L   Potassium 3.3 (L) 3.5 - 5.1 mmol/L   Chloride 107 101 - 111 mmol/L   CO2 20 (L) 22 - 32 mmol/L   Glucose, Bld 74 65 - 99 mg/dL   BUN 18 6 - 20 mg/dL   Creatinine, Ser 1.10 (H) 0.44 - 1.00 mg/dL   Calcium 8.9 8.9 - 10.3 mg/dL   Total Protein 6.7 6.5 - 8.1 g/dL   Albumin 3.4 (L) 3.5 - 5.0 g/dL   AST 20 15 - 41 U/L   ALT 23 14 - 54 U/L   Alkaline Phosphatase 47 38 - 126 U/L   Total Bilirubin 0.7 0.3 - 1.2 mg/dL   GFR calc non Af Amer 52 (L) >60 mL/min   GFR calc Af Amer 60 (L) >60 mL/min    Comment: (NOTE) The eGFR has been calculated using the CKD EPI equation. This calculation has not been validated in all clinical situations. eGFR's persistently <60 mL/min signify possible Chronic Kidney Disease.    Anion gap 12 5 - 15  Lipase, blood     Status: Abnormal   Collection Time: 05/26/14  4:16 PM  Result Value Ref Range   Lipase 20  (L) 22 - 51 U/L  Basic metabolic panel     Status: Abnormal   Collection Time: 05/27/14  5:42 AM  Result Value Ref Range   Sodium 136 135 - 145 mmol/L   Potassium 3.2 (L) 3.5 - 5.1 mmol/L   Chloride 106 101 - 111 mmol/L   CO2 20 (L) 22 - 32 mmol/L   Glucose, Bld 100 (H) 65 - 99 mg/dL   BUN 16 6 - 20 mg/dL   Creatinine, Ser 0.95 0.44 - 1.00 mg/dL   Calcium 8.6 (L) 8.9 - 10.3 mg/dL   GFR calc non Af Amer >60 >60 mL/min   GFR calc Af Amer >60 >60 mL/min    Comment: (NOTE) The eGFR has been calculated using the CKD EPI equation. This calculation has not been validated in all clinical situations. eGFR's persistently <60 mL/min signify possible Chronic Kidney Disease.    Anion gap 10 5 - 15  CBC     Status: Abnormal   Collection Time: 05/27/14  5:42 AM  Result Value Ref Range   WBC 3.2 (L) 4.0 - 10.5 K/uL   RBC 4.48 3.87 - 5.11 MIL/uL   Hemoglobin 11.7 (L) 12.0 - 15.0 g/dL   HCT 36.5 36.0 - 46.0 %   MCV 81.5 78.0 - 100.0 fL   MCH 26.1 26.0 - 34.0 pg   MCHC 32.1 30.0 - 36.0 g/dL   RDW 14.2 11.5 - 15.5 %   Platelets 212 150 - 400 K/uL    Imaging / Studies: Dg Abd Acute W/chest  05/26/2014   CLINICAL DATA:  Persistent vomiting and nausea.  EXAM: DG ABDOMEN ACUTE W/ 1V CHEST  COMPARISON:  05/22/2014  FINDINGS: There is minimal distention of 2 small bowel loops in the left mid abdomen. This is unchanged. Bowel gas pattern is otherwise normal. No free air or free fluid. Heart and lungs appear normal. No osseous abnormality.  IMPRESSION: Minimal nonspecific distention of 2 small bowel loops in the left mid abdomen, unchanged. The finding is not suggestive of small bowel obstruction.   Electronically Signed   By: Jeneen Rinks  Maxwell M.D.   On: 05/26/2014 15:12    Medications / Allergies:  Scheduled Meds: . carvedilol  25 mg Oral BID WC  . enoxaparin (LOVENOX) injection  40 mg Subcutaneous Q24H  . furosemide  40 mg Oral Daily  . pantoprazole (PROTONIX) IV  40 mg Intravenous QHS  .  pravastatin  20 mg Oral q1800  . spironolactone  25 mg Oral Daily  . verapamil  60 mg Oral TID   Continuous Infusions: . dextrose 5 % and 0.45 % NaCl with KCl 20 mEq/L 75 mL/hr at 05/26/14 2115   PRN Meds:.acetaminophen, loratadine, morphine injection, ondansetron, oxyCODONE  Antibiotics: Anti-infectives    None        Assessment/Plan S/p Laparoscopic exploration with open repair of hernia with sutures only---Dr. Lucia Gaskins 05/04/14 -no issues  Recurrent epigastric abdominal pain with n/v -recent CT abdomen okay, AXR without an obstruction, surgical wound is healing well. -possibly due to symptomatic cholelithiasis, await abdominal US FEN-NPO for now, IVF, supplement low k VTE prophylaxis-SCD/lovenox MMP-home meds  Erby Pian, ANP-BC [facility] Pager [phone](7A-4:30P)   05/27/2014 9:38 AM

## 2014-05-28 ENCOUNTER — Encounter (HOSPITAL_COMMUNITY): Admission: EM | Disposition: A | Discharge status: Home / Self Care | Payer: Self-pay | Source: Home / Self Care

## 2014-05-28 ENCOUNTER — Encounter (HOSPITAL_COMMUNITY): Payer: Self-pay | Admitting: *Deleted

## 2014-05-28 HISTORY — PX: ESOPHAGOGASTRODUODENOSCOPY: SHX5428

## 2014-05-28 LAB — BASIC METABOLIC PANEL
ANION GAP: 8 (ref 5–15)
BUN: 9 mg/dL (ref 6–20)
CALCIUM: 8.9 mg/dL (ref 8.9–10.3)
CO2: 23 mmol/L (ref 22–32)
Chloride: 106 mmol/L (ref 101–111)
Creatinine, Ser: 0.85 mg/dL (ref 0.44–1.00)
GFR calc Af Amer: >60 mL/min (ref >60)
GFR calc non Af Amer: >60 mL/min (ref >60)
GLUCOSE: 111 mg/dL — AB (ref 65–99)
Potassium: 3.7 mmol/L (ref 3.5–5.1)
SODIUM: 137 mmol/L (ref 135–145)

## 2014-05-28 SURGERY — EGD (ESOPHAGOGASTRODUODENOSCOPY)
Anesthesia: Moderate Sedation

## 2014-05-28 MED ORDER — PANTOPRAZOLE SODIUM 40 MG PO TBEC
40.0000 mg | DELAYED_RELEASE_TABLET | Freq: Two times a day (BID) | ORAL | Status: DC
  Administered 2014-05-28 – 2014-06-08 (×21): 40 mg via ORAL
  Filled 2014-05-28 (×27): qty 1

## 2014-05-28 MED ORDER — SUCRALFATE 1 GM/10ML PO SUSP
1.0000 g | Freq: Three times a day (TID) | ORAL | Status: DC
  Administered 2014-05-28 – 2014-05-29 (×5): 1 g via ORAL
  Filled 2014-05-28 (×11): qty 10

## 2014-05-28 MED ORDER — FENTANYL CITRATE (PF) 100 MCG/2ML IJ SOLN
INTRAMUSCULAR | Status: AC
  Filled 2014-05-28: qty 2

## 2014-05-28 MED ORDER — BUTAMBEN-TETRACAINE-BENZOCAINE 2-2-14 % EX AERO
INHALATION_SPRAY | CUTANEOUS | Status: DC | PRN
  Administered 2014-05-28: 2 via TOPICAL

## 2014-05-28 MED ORDER — MIDAZOLAM HCL 10 MG/2ML IJ SOLN
INTRAMUSCULAR | Status: AC
  Filled 2014-05-28: qty 2

## 2014-05-28 MED ORDER — SUCRALFATE 1 G PO TABS
1.0000 g | ORAL_TABLET | Freq: Three times a day (TID) | ORAL | Status: DC
  Administered 2014-05-28 – 2014-06-08 (×38): 1 g via ORAL
  Filled 2014-05-28 (×47): qty 1

## 2014-05-28 MED ORDER — MIDAZOLAM HCL 10 MG/2ML IJ SOLN
INTRAMUSCULAR | Status: DC | PRN
  Administered 2014-05-28 (×2): 2 mg via INTRAVENOUS

## 2014-05-28 MED ORDER — FENTANYL CITRATE (PF) 100 MCG/2ML IJ SOLN
INTRAMUSCULAR | Status: DC | PRN
  Administered 2014-05-28 (×2): 25 ug via INTRAVENOUS

## 2014-05-28 NOTE — Progress Notes (Signed)
[name], female   DOB: 1950-04-21, 64 y.o.   MRN: [id number]    Subjective: Pt still c/o pain, unchanged.  Just took pain medications so feels ok right now  Objective: Vital signs in last 24 hours: Temp:  [97 F (36.1 C)-98.3 F (36.8 C)] 98.3 F (36.8 C) (05/19 0539) Pulse Rate:  [64-71] 67 (05/19 0539) Resp:  [18-20] 18 (05/19 0539) BP: (107-130)/(7-80) 116/7 mmHg (05/19 0539) SpO2:  [100 %] 100 % (05/18 2131) Last BM Date: 05/26/14  Intake/Output from previous day: 05/18 0701 - 05/19 0700 In: 1725 [I.V.:1725] Out: 1300 [Urine:1300] Intake/Output this shift:    PE: Abd: soft, mild epigastric tenderness, but just had pain meds so unreliable exam, +BS, obese Heart: regular Lungs: CTAB  Lab Results:   Recent Labs  05/26/14 1616 05/27/14 0542  WBC 3.1* 3.2*  HGB 13.5 11.7*  HCT 40.8 36.5  PLT 225 212   BMET  Recent Labs  05/27/14 0542 05/28/14 0520  NA 136 137  K 3.2* 3.7  CL 106 106  CO2 20* 23  GLUCOSE 100* 111*  BUN 16 9  CREATININE 0.95 0.85  CALCIUM 8.6* 8.9   PT/INR No results for input(s): LABPROT, INR in the last 72 hours. CMP     Component Value Date/Time   NA 137 05/28/2014 0520   K 3.7 05/28/2014 0520   CL 106 05/28/2014 0520   CO2 23 05/28/2014 0520   GLUCOSE 111* 05/28/2014 0520   BUN 9 05/28/2014 0520   CREATININE 0.85 05/28/2014 0520   CREATININE 1.37* 10/07/2013 1237   CALCIUM 8.9 05/28/2014 0520   PROT 6.7 05/26/2014 1616   ALBUMIN 3.4* 05/26/2014 1616   AST 20 05/26/2014 1616   ALT 23 05/26/2014 1616   ALKPHOS 47 05/26/2014 1616   BILITOT 0.7 05/26/2014 1616   GFRNONAA >60 05/28/2014 0520   GFRNONAA 48* 04/24/2013 1017   GFRAA >60 05/28/2014 0520   GFRAA 55* 04/24/2013 1017   Lipase     Component Value Date/Time   LIPASE 20* 05/26/2014 1616       Studies/Results: Dg Abd Acute W/chest  05/26/2014   CLINICAL DATA:  Persistent vomiting and nausea.  EXAM: DG ABDOMEN ACUTE W/ 1V CHEST   COMPARISON:  05/22/2014  FINDINGS: There is minimal distention of 2 small bowel loops in the left mid abdomen. This is unchanged. Bowel gas pattern is otherwise normal. No free air or free fluid. Heart and lungs appear normal. No osseous abnormality.  IMPRESSION: Minimal nonspecific distention of 2 small bowel loops in the left mid abdomen, unchanged. The finding is not suggestive of small bowel obstruction.   Electronically Signed   By: Francene Boyers M.D.   On: 05/26/2014 15:12   US Abdomen Limited Ruq  05/27/2014   CLINICAL DATA:  Epigastric pain for the past 3 days  EXAM: US ABDOMEN LIMITED - RIGHT UPPER QUADRANT  COMPARISON:  CT scan of the abdomen and pelvis of May 22, 2014  FINDINGS: Gallbladder:  The gallbladder is adequately distended. There is echogenic intraluminal material compatible with sludge. No discrete stones are evident. There is no wall thickening, pericholecystic fluid, or positive sonographic Murphy's sign.  Common bile duct:  Diameter: 2-6 mm.  There are no abnormal intraluminal echoes.  Liver:  The hepatic echotexture is slightly heterogeneous. There is no focal mass or ductal dilation.  IMPRESSION: There is sludge within the gallbladder lumen but no evidence of acute cholecystitis. The liver and common bile duct exhibit  no acute abnormalities.   Electronically Signed   By: David  [place] M.D.   On: 05/27/2014 12:13    Anti-infectives: Anti-infectives    None       Assessment/Plan  1. S/p ventral hernia repair 05-04-14 2. Recurrent epigastric abdominal pain with N/V -all post op complications have been ruled out -abdominal US reveals some sludge, but no gallstones -she has a h/o PUD.  Will have GI evaluate her for endoscopy.  If her endoscopy is negative, then would consider lap chole possibly.    FEN - NPO until GI sees her DVT prophy - SCDs/lovenox  LOS: 2 days    Michelle Hurley 05/28/2014, 8:18 AM Pager: [phone]

## 2014-05-28 NOTE — Consult Note (Signed)
[facility] Consult Note  Referring Provider: No ref. provider found Primary Care Physician:  Angelica Chessman, MD Primary Gastroenterologist:  Dr.  Laurel Dimmer Complaint: Abdominal pain nausea and vomiting HPI: Michelle Hurley is an 64 y.o. black female  who is status post incarcerated periumbilical hernia repair 6 weeks ago who presents with a one-week history of epigastric abdominal pain postprandial nausea and vomiting. On initial CT scan 1 week ago there was some slight dilatation of loops of bowel but she improved rapidly and was discharged. Now she states that she develops worsening pain after eating anything although she has not been vomiting lately. A gallbladder ultrasound showed some sludge. We were consulted for endoscopy to rule out acid peptic disease which she reportedly has had in the past  Past Medical History  Diagnosis Date  . Hypertension   . Myopia   . Anxiety state, unspecified   . Morbid obesity   . Other and unspecified hyperlipidemia   . Unspecified essential hypertension   . Osteopenia   . Umbilical hernia   . Palpitations   . Arthritis   . H/O: hysterectomy   . NSVT (nonsustained ventricular tachycardia)     a. 01/2005 Echo: EF 55-65%    Past Surgical History  Procedure Laterality Date  . Laparoscopic transabdominal hernia N/A 05/03/2014    Procedure: LAPAROSCOPIC EXPLORATION WITH OPEN REPAIR OF INCARCERATED EPIGASTRIC HERNIA ;  Surgeon: Alphonsa Overall, MD;  Location: [facility];  Service: General;  Laterality: N/A;  . Abdominal hysterectomy  1974    Medications Prior to Admission  Medication Sig Dispense Refill  . acetaminophen (TYLENOL) 500 MG tablet Take 1,000 mg by mouth every 6 (six) hours as needed for mild pain or moderate pain.     . carvedilol (COREG) 25 MG tablet Take 1 tablet (25 mg total) by mouth 2 (two) times daily with a meal. 180 tablet 3  . furosemide (LASIX) 40 MG tablet Take 1 tablet (40 mg total) by mouth daily. 90 tablet 3  .  loratadine (CLARITIN) 10 MG tablet Take 10 mg by mouth daily as needed for allergies (allergies).    Marland Kitchen lovastatin (MEVACOR) 20 MG tablet Take 1 tablet (20 mg total) by mouth at bedtime. 90 tablet 3  . spironolactone (ALDACTONE) 25 MG tablet Take 1 tablet (25 mg total) by mouth daily. 90 tablet 3  . verapamil (CALAN) 120 MG tablet TAKE ONE-HALF TABLET BY MOUTH THREE TIMES DAILY (Patient taking differently: Take 60 mg by mouth 3 (three) times daily. ) 90 tablet 3    Allergies:  Allergies  Allergen Reactions  . Mushroom Extract Complex Anaphylaxis, Itching and Swelling    Swelling all over body   . Codeine Nausea And Vomiting  . Hydrocodone Nausea And Vomiting  . Codeine Nausea And Vomiting  . Prednisone Nausea And Vomiting    Family History  Problem Relation Age of Onset  . Heart disease Mother   . Diabetes Mother   . Thyroid cancer Sister   . Diabetes Sister   . Hypertension Brother     Social History:  reports that she has never smoked. She has never used smokeless tobacco. She reports that she does not drink alcohol or use illicit drugs.  Review of Systems: negative except as above   Blood pressure 160/89, pulse 67, temperature 98 F (36.7 C), temperature source Oral, resp. rate 14, height '5\' 2"'  (1.575 m), weight 78.472 kg (173 lb), last menstrual period 04/05/2012, SpO2 100 %. Head: Normocephalic, without obvious abnormality,  atraumatic Neck: no adenopathy, no carotid bruit, no JVD, supple, symmetrical, trachea midline and thyroid not enlarged, symmetric, no tenderness/mass/nodules Resp: clear to auscultation bilaterally Cardio: regular rate and rhythm, S1, S2 normal, no murmur, click, rub or gallop GI: Abdomen soft slightly distended with normoactive bowel sounds. No hepatomegaly or masses. There is some mild tenderness and guarding in the epigastrium Extremities: extremities normal, atraumatic, no cyanosis or edema  Results for orders placed or performed during the hospital  encounter of 05/26/14 (from the past 48 hour(s))  Urinalysis, Routine w reflex microscopic     Status: Abnormal   Collection Time: 05/26/14  1:37 PM  Result Value Ref Range   Color, Urine AMBER (A) YELLOW    Comment: BIOCHEMICALS MAY BE AFFECTED BY COLOR   APPearance CLEAR CLEAR   Specific Gravity, Urine 1.043 (H) 1.005 - 1.030   pH 5.5 5.0 - 8.0   Glucose, UA NEGATIVE NEGATIVE mg/dL   Hgb urine dipstick NEGATIVE NEGATIVE   Bilirubin Urine MODERATE (A) NEGATIVE   Ketones, ur 15 (A) NEGATIVE mg/dL   Protein, ur NEGATIVE NEGATIVE mg/dL   Urobilinogen, UA 1.0 0.0 - 1.0 mg/dL   Nitrite NEGATIVE NEGATIVE   Leukocytes, UA NEGATIVE NEGATIVE    Comment: MICROSCOPIC NOT DONE ON URINES WITH NEGATIVE PROTEIN, BLOOD, LEUKOCYTES, NITRITE, OR GLUCOSE <1000 mg/dL.  CBC with Differential     Status: Abnormal   Collection Time: 05/26/14  4:16 PM  Result Value Ref Range   WBC 3.1 (L) 4.0 - 10.5 K/uL   RBC 4.99 3.87 - 5.11 MIL/uL   Hemoglobin 13.5 12.0 - 15.0 g/dL   HCT 40.8 36.0 - 46.0 %   MCV 81.8 78.0 - 100.0 fL   MCH 27.1 26.0 - 34.0 pg   MCHC 33.1 30.0 - 36.0 g/dL   RDW 14.0 11.5 - 15.5 %   Platelets 225 150 - 400 K/uL   Neutrophils Relative % 41 (L) 43 - 77 %   Neutro Abs 1.3 (L) 1.7 - 7.7 K/uL   Lymphocytes Relative 42 12 - 46 %   Lymphs Abs 1.3 0.7 - 4.0 K/uL   Monocytes Relative 12 3 - 12 %   Monocytes Absolute 0.4 0.1 - 1.0 K/uL   Eosinophils Relative 4 0 - 5 %   Eosinophils Absolute 0.1 0.0 - 0.7 K/uL   Basophils Relative 1 0 - 1 %   Basophils Absolute 0.0 0.0 - 0.1 K/uL  Comprehensive metabolic panel     Status: Abnormal   Collection Time: 05/26/14  4:16 PM  Result Value Ref Range   Sodium 139 135 - 145 mmol/L   Potassium 3.3 (L) 3.5 - 5.1 mmol/L   Chloride 107 101 - 111 mmol/L   CO2 20 (L) 22 - 32 mmol/L   Glucose, Bld 74 65 - 99 mg/dL   BUN 18 6 - 20 mg/dL   Creatinine, Ser 1.10 (H) 0.44 - 1.00 mg/dL   Calcium 8.9 8.9 - 10.3 mg/dL   Total Protein 6.7 6.5 - 8.1 g/dL    Albumin 3.4 (L) 3.5 - 5.0 g/dL   AST 20 15 - 41 U/L   ALT 23 14 - 54 U/L   Alkaline Phosphatase 47 38 - 126 U/L   Total Bilirubin 0.7 0.3 - 1.2 mg/dL   GFR calc non Af Amer 52 (L) >60 mL/min   GFR calc Af Amer 60 (L) >60 mL/min    Comment: (NOTE) The eGFR has been calculated using the CKD EPI equation. This calculation  has not been validated in all clinical situations. eGFR's persistently <60 mL/min signify possible Chronic Kidney Disease.    Anion gap 12 5 - 15  Lipase, blood     Status: Abnormal   Collection Time: 05/26/14  4:16 PM  Result Value Ref Range   Lipase 20 (L) 22 - 51 U/L  Basic metabolic panel     Status: Abnormal   Collection Time: 05/27/14  5:42 AM  Result Value Ref Range   Sodium 136 135 - 145 mmol/L   Potassium 3.2 (L) 3.5 - 5.1 mmol/L   Chloride 106 101 - 111 mmol/L   CO2 20 (L) 22 - 32 mmol/L   Glucose, Bld 100 (H) 65 - 99 mg/dL   BUN 16 6 - 20 mg/dL   Creatinine, Ser 0.95 0.44 - 1.00 mg/dL   Calcium 8.6 (L) 8.9 - 10.3 mg/dL   GFR calc non Af Amer >60 >60 mL/min   GFR calc Af Amer >60 >60 mL/min    Comment: (NOTE) The eGFR has been calculated using the CKD EPI equation. This calculation has not been validated in all clinical situations. eGFR's persistently <60 mL/min signify possible Chronic Kidney Disease.    Anion gap 10 5 - 15  CBC     Status: Abnormal   Collection Time: 05/27/14  5:42 AM  Result Value Ref Range   WBC 3.2 (L) 4.0 - 10.5 K/uL   RBC 4.48 3.87 - 5.11 MIL/uL   Hemoglobin 11.7 (L) 12.0 - 15.0 g/dL   HCT 36.5 36.0 - 46.0 %   MCV 81.5 78.0 - 100.0 fL   MCH 26.1 26.0 - 34.0 pg   MCHC 32.1 30.0 - 36.0 g/dL   RDW 14.2 11.5 - 15.5 %   Platelets 212 150 - 400 K/uL  Basic metabolic panel     Status: Abnormal   Collection Time: 05/28/14  5:20 AM  Result Value Ref Range   Sodium 137 135 - 145 mmol/L   Potassium 3.7 3.5 - 5.1 mmol/L   Chloride 106 101 - 111 mmol/L   CO2 23 22 - 32 mmol/L   Glucose, Bld 111 (H) 65 - 99 mg/dL   BUN 9 6 -  20 mg/dL   Creatinine, Ser 0.85 0.44 - 1.00 mg/dL   Calcium 8.9 8.9 - 10.3 mg/dL   GFR calc non Af Amer >60 >60 mL/min   GFR calc Af Amer >60 >60 mL/min    Comment: (NOTE) The eGFR has been calculated using the CKD EPI equation. This calculation has not been validated in all clinical situations. eGFR's persistently <60 mL/min signify possible Chronic Kidney Disease.    Anion gap 8 5 - 15   Dg Abd Acute W/chest  05/26/2014   CLINICAL DATA:  Persistent vomiting and nausea.  EXAM: DG ABDOMEN ACUTE W/ 1V CHEST  COMPARISON:  05/22/2014  FINDINGS: There is minimal distention of 2 small bowel loops in the left mid abdomen. This is unchanged. Bowel gas pattern is otherwise normal. No free air or free fluid. Heart and lungs appear normal. No osseous abnormality.  IMPRESSION: Minimal nonspecific distention of 2 small bowel loops in the left mid abdomen, unchanged. The finding is not suggestive of small bowel obstruction.   Electronically Signed   By: Lorriane Shire M.D.   On: 05/26/2014 15:12   US Abdomen Limited Ruq  05/27/2014   CLINICAL DATA:  Epigastric pain for the past 3 days  EXAM: US ABDOMEN LIMITED - RIGHT UPPER QUADRANT  COMPARISON:  CT scan of the abdomen and pelvis of May 22, 2014  FINDINGS: Gallbladder:  The gallbladder is adequately distended. There is echogenic intraluminal material compatible with sludge. No discrete stones are evident. There is no wall thickening, pericholecystic fluid, or positive sonographic Murphy's sign.  Common bile duct:  Diameter: 2-6 mm.  There are no abnormal intraluminal echoes.  Liver:  The hepatic echotexture is slightly heterogeneous. There is no focal mass or ductal dilation.  IMPRESSION: There is sludge within the gallbladder lumen but no evidence of acute cholecystitis. The liver and common bile duct exhibit no acute abnormalities.   Electronically Signed   By: David  Martinique M.D.   On: 05/27/2014 12:13    Assessment: Persistent epigastric pain nausea and  vomiting after uncomplicated periumbilical hernia repair 6 weeks ago.  possible history of peptic ulcer disease Plan:  Will proceed with EGD at this time to rule out acid peptic disease, pyloric channel obstruction or deformity etc. Quierra Silverio C 05/28/2014, 1:24 PM

## 2014-05-28 NOTE — Progress Notes (Signed)
EGD done. See full report. She does have mild peptic ulcer disease with one small shallow prepyloric ulcer and a few small erosions although not sure that this would cause the severity of her symptoms. Will treat aggressively with antipeptic therapy and eradication of H. pylori if present.

## 2014-05-28 NOTE — Op Note (Signed)
[facility] [place], [address]   ENDOSCOPY PROCEDURE REPORT  PATIENT: Michelle Hurley, Michelle Hurley  MR#: [id number] BIRTHDATE: [birth year] , 85  yrs. old GENDER: female ENDOSCOPIST:Muath Hallam Amedeo Plenty, MD REFERRED BY: PROCEDURE DATE:  06/20/14 PROCEDURE: ASA CLASS: INDICATIONS: abdominal pain nausea and vomiting MEDICATION: fentanyl 50 g, Versed 4 mg TOPICAL ANESTHETIC:   Cetacaine spray  DESCRIPTION OF PROCEDURE:   After the risks and benefits of the procedure were explained, informed consent was obtained.  The Pentax Gastroscope [id number]  endoscope was introduced through the mouth  and advanced to the second portion of the duodenum .  The instrument was slowly withdrawn as the mucosa was fully examined. Estimated blood loss is zero unless otherwise noted in this procedure report.    Esophagus: Minimal hiatal hernia stomach: Several small antral erosions with central exudate. 1 lesser curve 3-4 mm shallow ulcer with no stigmata of hemorrhage. No retained food in the stomach. Pylorus was not deformed retroflex view the cardia was unremarkable Duodenum: Minimal bulbar duodenitis otherwise normal second portion normal         The scope was then withdrawn from the patient and the procedure completed.  COMPLICATIONS: There were no immediate complications.  ENDOSCOPIC IMPRESSION: small shallow gastric ulcer with several small antral erosions Small hiatal hernia No evidence of pyloric or duodenal outlet obstruction or deformity, no retained food in the stomach RECOMMENDATIONS: aggressive antipeptic therapy and await biopsies to rule out H. pylori. If does not respond to this therapy consider hida scan or cholecystectomy   _______________________________ eSigned:  Teena Irani, MD 06-20-2014 1:53 PM     cc:  CPT CODES: ICD CODES:  The ICD and CPT codes recommended by this software are interpretations from the data that the clinical staff has  captured with the software.  The verification of the translation of this report to the ICD and CPT codes and modifiers is the sole responsibility of the health care institution and practicing physician where this report was generated.  [employer] will not be held responsible for the validity of the ICD and CPT codes included on this report.  AMA assumes no liability for data contained or not contained herein. CPT is a Designer, television/film set of the [employer].

## 2014-05-29 ENCOUNTER — Encounter (HOSPITAL_COMMUNITY): Payer: Self-pay | Admitting: Gastroenterology

## 2014-05-29 LAB — CLOSTRIDIUM DIFFICILE BY PCR: Toxigenic C. Difficile by PCR: NEGATIVE

## 2014-05-29 MED ORDER — FLUCONAZOLE 200 MG PO TABS
200.0000 mg | ORAL_TABLET | Freq: Once | ORAL | Status: AC
  Administered 2014-05-29: 200 mg via ORAL
  Filled 2014-05-29: qty 1

## 2014-05-29 NOTE — Progress Notes (Signed)
[facility] Progress Note  Subjective: Still complaining of significant immediate postprandial abdominal pain even for full liquids  Objective: Vital signs in last 24 hours: Temp:  [98 F (36.7 C)-98.2 F (36.8 C)] 98.1 F (36.7 C) (05/20 0537) Pulse Rate:  [65-71] 70 (05/20 0852) Resp:  [11-20] 18 (05/20 0537) BP: (105-170)/(65-92) 113/65 mmHg (05/20 0852) SpO2:  [97 %-100 %] 98 % (05/20 0537) Weight:  [78.472 kg (173 lb)] 78.472 kg (173 lb) (05/19 1235) Weight change:    PE: Abdomen soft but tender in the epigastrium  Lab Results: No results found for this or any previous visit (from the past 24 hour(s)).  Studies/Results: US Abdomen Limited Ruq  05/27/2014   CLINICAL DATA:  Epigastric pain for the past 3 days  EXAM: US ABDOMEN LIMITED - RIGHT UPPER QUADRANT  COMPARISON:  CT scan of the abdomen and pelvis of May 22, 2014  FINDINGS: Gallbladder:  The gallbladder is adequately distended. There is echogenic intraluminal material compatible with sludge. No discrete stones are evident. There is no wall thickening, pericholecystic fluid, or positive sonographic Murphy's sign.  Common bile duct:  Diameter: 2-6 mm.  There are no abnormal intraluminal echoes.  Liver:  The hepatic echotexture is slightly heterogeneous. There is no focal mass or ductal dilation.  IMPRESSION: There is sludge within the gallbladder lumen but no evidence of acute cholecystitis. The liver and common bile duct exhibit no acute abnormalities.   Electronically Signed   By: David  Martinique M.D.   On: 05/27/2014 12:13      Assessment: Continued epigastric abdominal pain with relatively small gastric ulcer and a few erosions  Plan: 1. Continue aggressive antipeptic therapy 2. Await biopsies to rule out H. pylori and treat if positive 3. Consider gallbladder disease does not respond to antipeptic therapy soon.    Sybol Morre C 05/29/2014, 9:02 AM

## 2014-05-29 NOTE — Progress Notes (Signed)
[name], female   DOB: 16-Sep-1950, 64 y.o.   MRN: [id number] 1 Day Post-Op  Subjective: Pt had a bad night apparently.  She tried to drink the full liquids last night and had a lot of pain and nausea.    Objective: Vital signs in last 24 hours: Temp:  [98 F (36.7 C)-98.4 F (36.9 C)] 98.1 F (36.7 C) (05/20 0537) Pulse Rate:  [65-71] 65 (05/20 0537) Resp:  [11-20] 18 (05/20 0537) BP: (105-170)/(66-92) 123/78 mmHg (05/20 0537) SpO2:  [97 %-100 %] 98 % (05/20 0537) Weight:  [78.472 kg (173 lb)] 78.472 kg (173 lb) (05/19 1235) Last BM Date: 05/26/14  Intake/Output from previous day: 05/19 0701 - 05/20 0700 In: 642.5 [I.V.:642.5] Out: 600 [Urine:600] Intake/Output this shift:    PE: Abd: soft, minimally tender in epigastrium, +BS, ND, obese Heart: regular Lungs: CTAB  Lab Results:   Recent Labs  05/26/14 1616 05/27/14 0542  WBC 3.1* 3.2*  HGB 13.5 11.7*  HCT 40.8 36.5  PLT 225 212   BMET  Recent Labs  05/27/14 0542 05/28/14 0520  NA 136 137  K 3.2* 3.7  CL 106 106  CO2 20* 23  GLUCOSE 100* 111*  BUN 16 9  CREATININE 0.95 0.85  CALCIUM 8.6* 8.9   PT/INR No results for input(s): LABPROT, INR in the last 72 hours. CMP     Component Value Date/Time   NA 137 05/28/2014 0520   K 3.7 05/28/2014 0520   CL 106 05/28/2014 0520   CO2 23 05/28/2014 0520   GLUCOSE 111* 05/28/2014 0520   BUN 9 05/28/2014 0520   CREATININE 0.85 05/28/2014 0520   CREATININE 1.37* 10/07/2013 1237   CALCIUM 8.9 05/28/2014 0520   PROT 6.7 05/26/2014 1616   ALBUMIN 3.4* 05/26/2014 1616   AST 20 05/26/2014 1616   ALT 23 05/26/2014 1616   ALKPHOS 47 05/26/2014 1616   BILITOT 0.7 05/26/2014 1616   GFRNONAA >60 05/28/2014 0520   GFRNONAA 48* 04/24/2013 1017   GFRAA >60 05/28/2014 0520   GFRAA 55* 04/24/2013 1017   Lipase     Component Value Date/Time   LIPASE 20* 05/26/2014 1616       Studies/Results: US Abdomen Limited Ruq  05/27/2014   CLINICAL  DATA:  Epigastric pain for the past 3 days  EXAM: US ABDOMEN LIMITED - RIGHT UPPER QUADRANT  COMPARISON:  CT scan of the abdomen and pelvis of May 22, 2014  FINDINGS: Gallbladder:  The gallbladder is adequately distended. There is echogenic intraluminal material compatible with sludge. No discrete stones are evident. There is no wall thickening, pericholecystic fluid, or positive sonographic Murphy's sign.  Common bile duct:  Diameter: 2-6 mm.  There are no abnormal intraluminal echoes.  Liver:  The hepatic echotexture is slightly heterogeneous. There is no focal mass or ductal dilation.  IMPRESSION: There is sludge within the gallbladder lumen but no evidence of acute cholecystitis. The liver and common bile duct exhibit no acute abnormalities.   Electronically Signed   By: David  [place] M.D.   On: 05/27/2014 12:13    Anti-infectives: Anti-infectives    None       Assessment/Plan 1. S/p ventral hernia repair 05-04-14 2. Recurrent epigastric abdominal pain with N/V -all post op complications have been ruled out -abdominal US reveals some sludge, but no gallstones -she has a h/o PUD.  -GI did upper endo yesterday which revealed several shallow ulcers.  She will be treated with carafate and BID protonix.  bx are pending to rule out h. Pylori.  If this is present, will treat for this. -clear liquids as she did not tolerate her full liquids well.  FEN - clears, IVFs DVT prophy - SCDs/lovenox  LOS: 3 days    Kristol Almanzar E 05/29/2014, 8:09 AM Pager: [phone]

## 2014-05-30 NOTE — Care Management Note (Signed)
Medicare Important Message given? YES  Date Medicare IM given:05/30/14  Medicare IM given by: Venita Sheffield RN

## 2014-05-30 NOTE — Progress Notes (Signed)
[name], female   DOB: 1950/12/05, 64 y.o.   MRN: [id number] 2 Days Post-Op  Subjective: Pt did better last night.  Tolerated some clear liquids.      Objective: Vital signs in last 24 hours: Temp:  [97.3 F (36.3 C)-97.8 F (36.6 C)] 97.6 F (36.4 C) (05/21 0459) Pulse Rate:  [57-66] 66 (05/21 0459) Resp:  [20] 20 (05/21 0459) BP: (116-128)/(71-77) 118/77 mmHg (05/21 0459) SpO2:  [98 %-100 %] 98 % (05/21 0459) Last BM Date: 05/29/14  Intake/Output from previous day: 05/20 0701 - 05/21 0700 In: 1020 [P.O.:120; I.V.:900] Out: 1000 [Urine:1000] Intake/Output this shift:    PE: Abd: soft, minimally tender in epigastrium, ND, obese Gen A&O x 3 Lungs: breathing comfortably  Lab Results:  No results for input(s): WBC, HGB, HCT, PLT in the last 72 hours. BMET  Recent Labs  05/28/14 0520  NA 137  K 3.7  CL 106  CO2 23  GLUCOSE 111*  BUN 9  CREATININE 0.85  CALCIUM 8.9   PT/INR No results for input(s): LABPROT, INR in the last 72 hours. CMP     Component Value Date/Time   NA 137 05/28/2014 0520   K 3.7 05/28/2014 0520   CL 106 05/28/2014 0520   CO2 23 05/28/2014 0520   GLUCOSE 111* 05/28/2014 0520   BUN 9 05/28/2014 0520   CREATININE 0.85 05/28/2014 0520   CREATININE 1.37* 10/07/2013 1237   CALCIUM 8.9 05/28/2014 0520   PROT 6.7 05/26/2014 1616   ALBUMIN 3.4* 05/26/2014 1616   AST 20 05/26/2014 1616   ALT 23 05/26/2014 1616   ALKPHOS 47 05/26/2014 1616   BILITOT 0.7 05/26/2014 1616   GFRNONAA >60 05/28/2014 0520   GFRNONAA 48* 04/24/2013 1017   GFRAA >60 05/28/2014 0520   GFRAA 55* 04/24/2013 1017   Lipase     Component Value Date/Time   LIPASE 20* 05/26/2014 1616       Studies/Results: No results found.  Anti-infectives: Anti-infectives    Start     Dose/Rate Route Frequency Ordered Stop   05/29/14 1230  fluconazole (DIFLUCAN) tablet 200 mg     200 mg Oral  Once 05/29/14 1222 05/29/14 1352        Assessment/Plan 1. S/p ventral hernia repair 05-04-14 2. Recurrent epigastric abdominal pain with N/V -all post op complications have been ruled out -abdominal US reveals some sludge, but no gallstones -she has a h/o PUD.  -GI did upper endo yesterday which revealed several shallow ulcers.  She will be treated with carafate and BID protonix. bx are pending to rule out h. Pylori.  If this is present, will treat for this. -try full liquids again.   FEN - clears, IVFs DVT prophy - SCDs/lovenox  LOS: 4 days    Kenasia Scheller 05/30/2014, 10:31 AM

## 2014-05-31 ENCOUNTER — Inpatient Hospital Stay (HOSPITAL_COMMUNITY): Payer: Commercial Managed Care - HMO

## 2014-05-31 ENCOUNTER — Encounter (HOSPITAL_COMMUNITY): Payer: Self-pay | Admitting: Radiology

## 2014-05-31 LAB — HEPATIC FUNCTION PANEL
ALBUMIN: 3.6 g/dL (ref 3.5–5.0)
ALT: 51 U/L (ref 14–54)
AST: 33 U/L (ref 15–41)
Alkaline Phosphatase: 51 U/L (ref 38–126)
Bilirubin, Direct: 0.5 mg/dL (ref 0.1–0.5)
Indirect Bilirubin: 1 mg/dL — ABNORMAL HIGH (ref 0.3–0.9)
TOTAL PROTEIN: 7.3 g/dL (ref 6.5–8.1)
Total Bilirubin: 1.5 mg/dL — ABNORMAL HIGH (ref 0.3–1.2)

## 2014-05-31 LAB — CBC
HCT: 46 % (ref 36.0–46.0)
Hemoglobin: 15.1 g/dL — ABNORMAL HIGH (ref 12.0–15.0)
MCH: 27.3 pg (ref 26.0–34.0)
MCHC: 32.8 g/dL (ref 30.0–36.0)
MCV: 83 fL (ref 78.0–100.0)
Platelets: 161 10*3/uL (ref 150–400)
RBC: 5.54 MIL/uL — AB (ref 3.87–5.11)
RDW: 14.1 % (ref 11.5–15.5)
WBC: 3.6 10*3/uL — ABNORMAL LOW (ref 4.0–10.5)

## 2014-05-31 LAB — LIPASE, BLOOD: Lipase: 20 U/L — ABNORMAL LOW (ref 22–51)

## 2014-05-31 LAB — BASIC METABOLIC PANEL
Anion gap: 10 (ref 5–15)
BUN: 8 mg/dL (ref 6–20)
CO2: 24 mmol/L (ref 22–32)
CREATININE: 1.46 mg/dL — AB (ref 0.44–1.00)
Calcium: 9.4 mg/dL (ref 8.9–10.3)
Chloride: 100 mmol/L — ABNORMAL LOW (ref 101–111)
GFR, EST AFRICAN AMERICAN: 42 mL/min — AB (ref >60)
GFR, EST NON AFRICAN AMERICAN: 37 mL/min — AB (ref >60)
GLUCOSE: 108 mg/dL — AB (ref 65–99)
POTASSIUM: 4 mmol/L (ref 3.5–5.1)
SODIUM: 134 mmol/L — AB (ref 135–145)

## 2014-05-31 MED ORDER — IOHEXOL 300 MG/ML  SOLN
100.0000 mL | Freq: Once | INTRAMUSCULAR | Status: AC | PRN
  Administered 2014-05-31: 100 mL via INTRAVENOUS

## 2014-05-31 MED ORDER — AMOXICILLIN 500 MG PO CAPS
1000.0000 mg | ORAL_CAPSULE | Freq: Two times a day (BID) | ORAL | Status: DC
  Administered 2014-05-31 – 2014-06-08 (×16): 1000 mg via ORAL
  Filled 2014-05-31 (×19): qty 2

## 2014-05-31 MED ORDER — CLARITHROMYCIN 500 MG PO TABS
500.0000 mg | ORAL_TABLET | Freq: Two times a day (BID) | ORAL | Status: DC
  Administered 2014-05-31 – 2014-06-08 (×16): 500 mg via ORAL
  Filled 2014-05-31 (×19): qty 1

## 2014-05-31 MED ORDER — SODIUM CHLORIDE 0.9 % IV SOLN
INTRAVENOUS | Status: DC
  Administered 2014-05-31 – 2014-06-03 (×4): via INTRAVENOUS

## 2014-05-31 NOTE — Progress Notes (Signed)
General Surgery Note  LOS: 5 days  POD -  3 Days Post-Op  Assessment/Plan: 1.  Gastric ulcers in the antrum  ESOPHAGOGASTRODUODENOSCOPY (EGD) - J. Amedeo Plenty - 05/28/2014  On Protonix/carafate  Biopsies positive for H. Pylori - will add amoxicillin and clarithromycin orally  Her symptoms seem excessive for what was found on endoscopy  2.  Ventral hernia repair - 05/04/2014 - D. Lillieann Pavlich  3.  Recurrent abdominal pain  Her pain seems out of proportion to what was found on upper endo.  On clear liquids for now.  4.  Gall bladder sludge on [name] - 05/27/2014 5.  HH seen on endo 6.  DVT prophylaxis - on Lovenox   Active Problems:   Postoperative nausea and vomiting  Subjective:  When she eats anything more than clear liquids she develops severe epigastric pain and nausea/vomits.    I broke off rounds on 5 [place] to see her son, but he had left the room by the time I got there to go to the airport (in [place]?) to catch a plane.  She is by herself. Objective:   Filed Vitals:   05/31/14 0937  BP: 142/88  Pulse: 66  Temp:   Resp: 16     Intake/Output from previous day:  05/21 0701 - 05/22 0700 In: 1380 [P.O.:480; I.V.:900] Out: 3050 [Urine:3050]  Intake/Output this shift:  Total I/O In: 150 [I.V.:150] Out: -    Physical Exam:   General: AA F who is alert and oriented.    HEENT: Normal. Pupils equal. .   Lungs: clear   Abdomen: Soft.  BS present   Wound: Well healed upper midline wound that is about one month old.   Lab Results:   No results for input(s): WBC, HGB, HCT, PLT in the last 72 hours.  BMET  No results for input(s): NA, K, CL, CO2, GLUCOSE, BUN, CREATININE, CALCIUM in the last 72 hours.  PT/INR  No results for input(s): LABPROT, INR in the last 72 hours.  ABG  No results for input(s): PHART, HCO3 in the last 72 hours.  Invalid input(s): PCO2, PO2   Studies/Results:  No results found.   Anti-infectives:   Anti-infectives    Start     Dose/Rate Route Frequency  Ordered Stop   05/29/14 1230  fluconazole (DIFLUCAN) tablet 200 mg     200 mg Oral  Once 05/29/14 1222 05/29/14 1352      Alphonsa Overall, MD, FACS Pager: [facility] Office: [phone] 05/31/2014

## 2014-05-31 NOTE — Progress Notes (Addendum)
[name], female   DOB: 10-29-1950, 64 y.o.   MRN: [id number] [facility] Gastroenterology Progress Note  Michelle Hurley 64 y.o. 1950-08-13   Subjective: Complaining of epigastric pain. Having loose stools. No vomiting.  Objective: Vital signs in last 24 hours: Filed Vitals:   05/31/14 0937  BP: 142/88  Pulse: 66  Temp: 97.8  Resp: 16    Physical Exam: Gen: alert, no acute distress CV: RRR Chest: CTA B Abd: epigastric tenderness with guarding, otherwise nontender, soft, nondistended, +BS  Lab Results: No results for input(s): NA, K, CL, CO2, GLUCOSE, BUN, CREATININE, CALCIUM, MG, PHOS in the last 72 hours. No results for input(s): AST, ALT, ALKPHOS, BILITOT, PROT, ALBUMIN in the last 72 hours. No results for input(s): WBC, NEUTROABS, HGB, HCT, MCV, PLT in the last 72 hours. No results for input(s): LABPROT, INR in the last 72 hours.    Assessment/Plan: Epigastric pain with EGD showing small gastric ulcers (H. Pylori gastritis). I do NOT think her small gastric ulcer and gastritis is the source of her ongoing pain and am concerned it is due to her gallstones. Will do updated lipase/LFTs. Updated abd/pelvic CT (IV contrast only) to look for cholecystitis and small bowel obstruction. Change to NPO. Start IVFs. Supportive care.   [place] C. 05/31/2014, 1:09 PM  Pager [phone]  If no answer or after 5 PM call [phone]

## 2014-05-31 NOTE — Progress Notes (Signed)
05/31/14 1700  Per MD patient has chronic diarrhea and does not need to have C-diff test or enteric precautions.

## 2014-06-01 ENCOUNTER — Inpatient Hospital Stay (HOSPITAL_COMMUNITY): Payer: Commercial Managed Care - HMO

## 2014-06-01 LAB — GLUCOSE, CAPILLARY: Glucose-Capillary: 127 mg/dL — ABNORMAL HIGH (ref 65–99)

## 2014-06-01 MED ORDER — SINCALIDE 5 MCG IJ SOLR
0.0200 ug/kg | Freq: Once | INTRAMUSCULAR | Status: AC
  Administered 2014-06-01: 1.6 ug via INTRAVENOUS
  Filled 2014-06-01: qty 5

## 2014-06-01 MED ORDER — TECHNETIUM TC 99M MEBROFENIN IV KIT
5.0000 | PACK | Freq: Once | INTRAVENOUS | Status: AC | PRN
  Administered 2014-06-01: 5 via INTRAVENOUS

## 2014-06-01 NOTE — Progress Notes (Signed)
[name] 10:59 AM  Subjective: Patient looks better than her story sounds and she is tolerating clear liquids and is moving her bowels and has no new complaints and her CTs and endoscopy was reviewed and she did do some walking yesterday but not today  Objective: Vital signs stable afebrile no acute distress exam pertinent for her abdomen being soft nontender good bowel sounds today no new labs HIDA scan pending  Assessment: Nausea vomiting abdominal pain my initial impression is this is due to the isolated loop of small bowel seen on both CT scans probably adhesional  Plan: Await HIDA scan consider laparoscope to evaluate that loop or possibly a CT enterography and will follow with you  [facility]  Pager [phone] After 5PM or if no answer call [phone]

## 2014-06-01 NOTE — Progress Notes (Signed)
[facility] Progress Note  4 Days Post-Op  Subjective: Pt c/o of continued on and off epigastric abdominal pain with nausea and vomiting.  She was feeling dizzy this am and fell while in the bathroom trying to urinate.  The nurses got her off the floor and she became very nauseated and threw up.  She's not very hungry.  Was ambulating well prior to this episode.  Objective: Vital signs in last 24 hours: Temp:  [97.9 F (36.6 C)-98.4 F (36.9 C)] 98.4 F (36.9 C) (05/22 2105) Pulse Rate:  [66-81] 81 (05/23 0110) Resp:  [16-18] 16 (05/23 0110) BP: (102-142)/(71-88) 105/80 mmHg (05/23 0110) SpO2:  [99 %-100 %] 100 % (05/23 0110) Last BM Date: 05/31/14  Intake/Output from previous day: 05/22 0701 - 05/23 0700 In: 2713.8 [P.O.:840; I.V.:1873.8] Out: 1100 [Urine:800; Emesis/NG output:300] Intake/Output this shift:    PE: Gen:  Alert, NAD, pleasant Card:  RRR, no M/G/R heard Pulm:  CTA, no W/R/R Abd: Soft, mild tenderness in epigastrium, ND, +BS, no rebound or guarding, no HSM, scar well healed   Lab Results:   Recent Labs  05/31/14 1341  WBC 3.6*  HGB 15.1*  HCT 46.0  PLT 161   BMET  Recent Labs  05/31/14 1451  NA 134*  K 4.0  CL 100*  CO2 24  GLUCOSE 108*  BUN 8  CREATININE 1.46*  CALCIUM 9.4   PT/INR No results for input(s): LABPROT, INR in the last 72 hours. CMP     Component Value Date/Time   NA 134* 05/31/2014 1451   K 4.0 05/31/2014 1451   CL 100* 05/31/2014 1451   CO2 24 05/31/2014 1451   GLUCOSE 108* 05/31/2014 1451   BUN 8 05/31/2014 1451   CREATININE 1.46* 05/31/2014 1451   CREATININE 1.37* 10/07/2013 1237   CALCIUM 9.4 05/31/2014 1451   PROT 7.3 05/31/2014 1341   ALBUMIN 3.6 05/31/2014 1341   AST 33 05/31/2014 1341   ALT 51 05/31/2014 1341   ALKPHOS 51 05/31/2014 1341   BILITOT 1.5* 05/31/2014 1341   GFRNONAA 37* 05/31/2014 1451   GFRNONAA 48* 04/24/2013 1017   GFRAA 42* 05/31/2014 1451   GFRAA 55* 04/24/2013 1017    Lipase     Component Value Date/Time   LIPASE 20* 05/31/2014 1341       Studies/Results: Ct Abdomen Pelvis W Contrast  05/31/2014   CLINICAL DATA:  Gastric ulcers.  Abdominal pain.  EXAM: CT ABDOMEN AND PELVIS WITH CONTRAST  TECHNIQUE: Multidetector CT imaging of the abdomen and pelvis was performed using the standard protocol following bolus administration of intravenous contrast.  CONTRAST:  116mL OMNIPAQUE IOHEXOL 300 MG/ML  SOLN  COMPARISON:  Multiple exams, including 05/22/2014 and 05/26/2014  FINDINGS: Lower chest: 0.3 by 0.4 cm right middle lobe pulmonary nodule, image 3 series 4. 2 mm subpleural nodule in the right middle lobe, image 7 series 4.  Hepatobiliary: 4 mm segment 4a lesion in the liver, image 10 series 2. This is stable from 05/22/2014. No gallbladder wall thickening or para cholecystic fluid. CBD 7 mm, mildly prominent. No directly visualized choledocholithiasis.  Pancreas: Unremarkable  Spleen: Unremarkable  Adrenals/Urinary Tract: Unremarkable  Stomach/Bowel: Nondistended stomach, reported ulcerative disease not well seen in the stomach.  Mild fold thickening and distension in several loops of small bowel in the left abdomen measuring up to 3.7 cm diameter, with bowel dilation terminating on image 37 series 2 where there is some abnormal stranding of the mesentery adjacent to the transition. The  small bowel distal to this point is nondistended. The there is abnormal mucosal enhancement in the area of transition, for example on image 44 series 602. I do not see an obvious mechanical cause for obstruction.  There are air- fluid levels in the distal colon characteristic for diarrheal process. Terminal ileum grossly unremarkable. Appendix not well seen.  Vascular/Lymphatic: The celiac trunk and SMA appear patent although there is some minimal stranding at the root of the mesentery particularly along the celiac trunk. Aortoiliac atherosclerotic vascular disease. There is atheromatous  calcification of the origin of the lower of the 2 right renal arteries. I do not observe any thrombus in the mesenteric vasculature. Portal vein and superior mesenteric vein appear patent.  Reproductive: Uterus absent.  Ovaries normal.  Other: No supplemental non-categorized findings.  Musculoskeletal: Lumbar spondylosis and degenerative disc disease potentially causing mild foraminal narrowing at the L3-4 and L4-5 levels. Subcutaneous edema and underlying marrow edema in the supraumbilical region, probably related to recent laparoscopy. There is no fluid along this laparoscopy site, and the small low-density along the upper margin of it represents a small amount of adipose tissue rather than a gas bubble.  IMPRESSION: 1. There is a focal region of abnormal small bowel in the mid abdomen which demonstrates mucosal enhancement and focal mesenteric edema as shown on images 37- 40 of series 2. The small bowel leading up to this is moderately dilated. I do not see an obvious cause for obstruction, for example no obvious mass or kinking of the bowel. Reviewing the patient's operative note from 05/04/14 and interval progress nodes, several possibilities are presented. One possibility is that this abnormal loop represents the previously herniated small bowel which was reduced on 05/04/2014, but sustained enough of an injury due to the incarceration that it may be having continued problems, such as damage from low-grade ischemia. This is probably my favored theory based on the CT appearance and information at hand. This loop of small bowel is also right below the position of the supraumbilical trocar, but I favor that this is probably coincidental and not related to a trocar injury. Finally, this could be due to Crohn's disease or of some other occult cause of small bowel focal inflammation. Currently the terminal ileum is not inflamed. 2. There are air- fluid levels in the distal colon which is typical for a diarrheal  process. 3. No recurrent abdominal wall hernia. 4. Mild CBD dilatation, but no visualized stones. There were no stones seen on the prior ultrasound dated 05/27/2014. 5. 3 by 4 mm right middle lobe pulmonary nodule. If the patient is at high risk for bronchogenic carcinoma, follow-up chest CT at 1 year is recommended. If the patient is at low risk, no follow-up is needed. This recommendation follows the consensus statement: Guidelines for Management of Small Pulmonary Nodules Detected on CT Scans: A Statement from the [place] as published in Radiology 2005; 237:395-400. 6. 4 mm lesion in segment 4A of the liver is statistically highly likely to be benign, but technically nonspecific due to small size.   Electronically Signed   By: Van Clines M.D.   On: 05/31/2014 18:25    Anti-infectives: Anti-infectives    Start     Dose/Rate Route Frequency Ordered Stop   05/31/14 1045  amoxicillin (AMOXIL) capsule 1,000 mg     1,000 mg Oral Every 12 hours 05/31/14 1035     05/31/14 1045  clarithromycin (BIAXIN) tablet 500 mg     500 mg Oral Every  12 hours 05/31/14 1035     05/29/14 1230  fluconazole (DIFLUCAN) tablet 200 mg     200 mg Oral  Once 05/29/14 1222 05/29/14 1352       Assessment/Plan S/p ventral hernia repair 05/04/14 Dr. Lucia Gaskins Recurrent epigastric abdominal pain with N/V -CT scan questions low grade ischemia of the small bowel vs Crohn's disease, no recurrent abdominal wall hernia, air fluid levels in the distal colon consistent with diarrheal process -Abdominal US reveals some sludge, but no gallstones -Currently NPO, and not hungry -Order HIDA scan to rule out cholecystitis as the cause of her pain, NPO and no pain meds for 6 hours until scan. -Repeat labs H.pylori/ulcer disease -She has a h/o PUD.  -GI did upper endo 05/28/14 which revealed several shallow ulcers. She will be treated with carafate and BID protonix. Biopsy shows H.pylori infection, but no NO INTESTINAL  METAPLASIA, DYSPLASIA, OR MALIGNANCY. -Started on amoxicillin and clarithromycin for 10-14 days, protonix & carafate FEN - IVFs DVT proph - SCDs/lovenox    LOS: 6 days    DORT, Ambermarie Honeyman 06/01/2014, 8:14 AM Pager: [phone]

## 2014-06-01 NOTE — Progress Notes (Addendum)
Fall: during patient round, pt asked for pain medication. When nurse returned to give pain medication, pt was not in bed, however was on found sitting on the floor, sitting with back against the door frame. Pt stated that she became very hot after getting up to use the bathroom and needed to get cool so she sat down on the floor. She denies hitting head or having body aches from fall. Patient had emesis episode after getting help off the floor. She sat in the chair to have an emesis episode with 234mls output.  Vital signs are stable. P:81, RR: 20 BP: 105/80 SpO2: 99. Pt was he

## 2014-06-02 LAB — CBC
HEMATOCRIT: 38.2 % (ref 36.0–46.0)
HEMOGLOBIN: 12.2 g/dL (ref 12.0–15.0)
MCH: 26 pg (ref 26.0–34.0)
MCHC: 31.9 g/dL (ref 30.0–36.0)
MCV: 81.3 fL (ref 78.0–100.0)
Platelets: 231 10*3/uL (ref 150–400)
RBC: 4.7 MIL/uL (ref 3.87–5.11)
RDW: 14 % (ref 11.5–15.5)
WBC: 2.9 10*3/uL — AB (ref 4.0–10.5)

## 2014-06-02 LAB — BASIC METABOLIC PANEL
ANION GAP: 13 (ref 5–15)
BUN: 12 mg/dL (ref 6–20)
CO2: 21 mmol/L — ABNORMAL LOW (ref 22–32)
Calcium: 8.8 mg/dL — ABNORMAL LOW (ref 8.9–10.3)
Chloride: 102 mmol/L (ref 101–111)
Creatinine, Ser: 1.35 mg/dL — ABNORMAL HIGH (ref 0.44–1.00)
GFR calc Af Amer: 47 mL/min — ABNORMAL LOW (ref >60)
GFR, EST NON AFRICAN AMERICAN: 40 mL/min — AB (ref >60)
GLUCOSE: 89 mg/dL (ref 65–99)
Potassium: 3.1 mmol/L — ABNORMAL LOW (ref 3.5–5.1)
Sodium: 136 mmol/L (ref 135–145)

## 2014-06-02 MED ORDER — LIP MEDEX EX OINT
1.0000 "application " | TOPICAL_OINTMENT | Freq: Two times a day (BID) | CUTANEOUS | Status: DC
  Administered 2014-06-02 – 2014-06-08 (×7): 1 via TOPICAL
  Filled 2014-06-02 (×2): qty 7

## 2014-06-02 MED ORDER — ALUM & MAG HYDROXIDE-SIMETH 200-200-20 MG/5ML PO SUSP
30.0000 mL | Freq: Four times a day (QID) | ORAL | Status: DC | PRN
  Administered 2014-06-02 – 2014-06-07 (×2): 30 mL via ORAL
  Filled 2014-06-02 (×2): qty 30

## 2014-06-02 MED ORDER — MENTHOL 3 MG MT LOZG
1.0000 | LOZENGE | OROMUCOSAL | Status: DC | PRN
  Filled 2014-06-02: qty 9

## 2014-06-02 MED ORDER — PHENOL 1.4 % MT LIQD
2.0000 | OROMUCOSAL | Status: DC | PRN
  Filled 2014-06-02: qty 177

## 2014-06-02 MED ORDER — MAGIC MOUTHWASH
15.0000 mL | Freq: Four times a day (QID) | ORAL | Status: DC | PRN
  Filled 2014-06-02: qty 15

## 2014-06-02 MED ORDER — BISACODYL 10 MG RE SUPP: 10.0000 mg | Freq: Two times a day (BID) | RECTAL | Status: DC | PRN

## 2014-06-02 NOTE — Progress Notes (Signed)
5 Days Post-Op  Subjective: She states she feels fine this morning. No pain. No cramps. No nausea. She reports one stool and flatus during the night. It sounds like she had clear liquids for some last night. Has not advance to full liquid jet. Lab work this morning shows hemoglobin 12.2. WBC 2,900.    Objective: Vital signs in last 24 hours: Temp:  [97.9 F (36.6 C)-98.1 F (36.7 C)] 97.9 F (36.6 C) (05/24 0508) Pulse Rate:  [67-75] 70 (05/24 0508) Resp:  [17-18] 18 (05/24 0508) BP: (114-120)/(67-82) 120/67 mmHg (05/24 0508) SpO2:  [100 %] 100 % (05/24 0508) Weight:  [78.472 kg (173 lb)] 78.472 kg (173 lb) (05/23 1300) Last BM Date: 05/31/14  Intake/Output from previous day: 05/23 0701 - 05/24 0700 In: 2011.3 [P.O.:400; I.V.:1611.3] Out: 1250 [Urine:1250] Intake/Output this shift: Total I/O In: 997.5 [P.O.:300; I.V.:697.5] Out: 700 [Urine:700]  General appearance: Alert. Cooperative. In no distress whatsoever. Mental status normal. GI: Benign exam. Soft. Nontender. Not distended. Bowel sounds present. Upper abdominal incision healing well.  Lab Results:   Recent Labs  05/31/14 1341 06/02/14 0515  WBC 3.6* 2.9*  HGB 15.1* 12.2  HCT 46.0 38.2  PLT 161 231   BMET  Recent Labs  05/31/14 1451  NA 134*  K 4.0  CL 100*  CO2 24  GLUCOSE 108*  BUN 8  CREATININE 1.46*  CALCIUM 9.4   PT/INR No results for input(s): LABPROT, INR in the last 72 hours. ABG No results for input(s): PHART, HCO3 in the last 72 hours.  Invalid input(s): PCO2, PO2  Studies/Results: Ct Abdomen Pelvis W Contrast  05/31/2014   CLINICAL DATA:  Gastric ulcers.  Abdominal pain.  EXAM: CT ABDOMEN AND PELVIS WITH CONTRAST  TECHNIQUE: Multidetector CT imaging of the abdomen and pelvis was performed using the standard protocol following bolus administration of intravenous contrast.  CONTRAST:  148mL OMNIPAQUE IOHEXOL 300 MG/ML  SOLN  COMPARISON:  Multiple exams, including 05/22/2014 and  05/26/2014  FINDINGS: Lower chest: 0.3 by 0.4 cm right middle lobe pulmonary nodule, image 3 series 4. 2 mm subpleural nodule in the right middle lobe, image 7 series 4.  Hepatobiliary: 4 mm segment 4a lesion in the liver, image 10 series 2. This is stable from 05/22/2014. No gallbladder wall thickening or para cholecystic fluid. CBD 7 mm, mildly prominent. No directly visualized choledocholithiasis.  Pancreas: Unremarkable  Spleen: Unremarkable  Adrenals/Urinary Tract: Unremarkable  Stomach/Bowel: Nondistended stomach, reported ulcerative disease not well seen in the stomach.  Mild fold thickening and distension in several loops of small bowel in the left abdomen measuring up to 3.7 cm diameter, with bowel dilation terminating on image 37 series 2 where there is some abnormal stranding of the mesentery adjacent to the transition. The small bowel distal to this point is nondistended. The there is abnormal mucosal enhancement in the area of transition, for example on image 44 series 602. I do not see an obvious mechanical cause for obstruction.  There are air- fluid levels in the distal colon characteristic for diarrheal process. Terminal ileum grossly unremarkable. Appendix not well seen.  Vascular/Lymphatic: The celiac trunk and SMA appear patent although there is some minimal stranding at the root of the mesentery particularly along the celiac trunk. Aortoiliac atherosclerotic vascular disease. There is atheromatous calcification of the origin of the lower of the 2 right renal arteries. I do not observe any thrombus in the mesenteric vasculature. Portal vein and superior mesenteric vein appear patent.  Reproductive: Uterus absent.  Ovaries normal.  Other: No supplemental non-categorized findings.  Musculoskeletal: Lumbar spondylosis and degenerative disc disease potentially causing mild foraminal narrowing at the L3-4 and L4-5 levels. Subcutaneous edema and underlying marrow edema in the supraumbilical region,  probably related to recent laparoscopy. There is no fluid along this laparoscopy site, and the small low-density along the upper margin of it represents a small amount of adipose tissue rather than a gas bubble.  IMPRESSION: 1. There is a focal region of abnormal small bowel in the mid abdomen which demonstrates mucosal enhancement and focal mesenteric edema as shown on images 37- 40 of series 2. The small bowel leading up to this is moderately dilated. I do not see an obvious cause for obstruction, for example no obvious mass or kinking of the bowel. Reviewing the patient's operative note from 05/04/14 and interval progress nodes, several possibilities are presented. One possibility is that this abnormal loop represents the previously herniated small bowel which was reduced on 05/04/2014, but sustained enough of an injury due to the incarceration that it may be having continued problems, such as damage from low-grade ischemia. This is probably my favored theory based on the CT appearance and information at hand. This loop of small bowel is also right below the position of the supraumbilical trocar, but I favor that this is probably coincidental and not related to a trocar injury. Finally, this could be due to Crohn's disease or of some other occult cause of small bowel focal inflammation. Currently the terminal ileum is not inflamed. 2. There are air- fluid levels in the distal colon which is typical for a diarrheal process. 3. No recurrent abdominal wall hernia. 4. Mild CBD dilatation, but no visualized stones. There were no stones seen on the prior ultrasound dated 05/27/2014. 5. 3 by 4 mm right middle lobe pulmonary nodule. If the patient is at high risk for bronchogenic carcinoma, follow-up chest CT at 1 year is recommended. If the patient is at low risk, no follow-up is needed. This recommendation follows the consensus statement: Guidelines for Management of Small Pulmonary Nodules Detected on CT Scans: A  Statement from the [place] as published in Radiology 2005; 237:395-400. 6. 4 mm lesion in segment 4A of the liver is statistically highly likely to be benign, but technically nonspecific due to small size.   Electronically Signed   By: Van Clines M.D.   On: 05/31/2014 18:25   Nm Hepato W/eject Fract  06/01/2014   CLINICAL DATA:  Right upper quadrant abdominal pain  EXAM: NUCLEAR MEDICINE HEPATOBILIARY IMAGING WITH GALLBLADDER EF  TECHNIQUE: Sequential images of the abdomen were obtained out to 60 minutes following intravenous administration of radiopharmaceutical. After slow intravenous infusion of 1.2 micrograms Cholecystokinin, gallbladder ejection fraction was determined.  RADIOPHARMACEUTICALS:  5.0 mCi Technetium-32m Choletec IV  COMPARISON:  Abdominal and pelvic CT scan of May 31, 2014  FINDINGS: There is adequate uptake of the radiopharmaceutical by the liver. Activity is visible within the intrahepatic and common bile duct by 5 minutes. Bowel activity is visible by 15 minutes. The gallbladder is visualized by 35 minutes. Following CCK administration the 45 minutes ejection fraction is 85% which is normal. The patient did experience discomfort with CCK administration. At 45 min, normal ejection fraction is greater than 40%.  IMPRESSION: Normal hepatobiliary scan with normal gallbladder ejection fraction.   Electronically Signed   By: David  Martinique M.D.   On: 06/01/2014 14:14    Anti-infectives: Anti-infectives    Start  Dose/Rate Route Frequency Ordered Stop   05/31/14 1045  amoxicillin (AMOXIL) capsule 1,000 mg     1,000 mg Oral Every 12 hours 05/31/14 1035     05/31/14 1045  clarithromycin (BIAXIN) tablet 500 mg     500 mg Oral Every 12 hours 05/31/14 1035     05/29/14 1230  fluconazole (DIFLUCAN) tablet 200 mg     200 mg Oral  Once 05/29/14 1222 05/29/14 1352      Assessment/Plan: s/p Procedure(s): ESOPHAGOGASTRODUODENOSCOPY (EGD)  Abdominal pain and vomiting.  Etiology not established but the most probable cause is low-grade partial obstruction from a segment of jejunum seen on CT which probably correlates with the incarcerated segment seen at surgery on April 25.  Asymptomatic currently.  Advance diet to full liquids plus grits and eggs  Patient is aware that this segment of small bowel may heal and her symptoms may resolve without intervention.  Alternatively., I have explained to her that this segment of small bowel  may stricture and cause recurrent symptoms, necessitating laparoscopy, laparotomy, possible small bowel resection.  For now, we are going to advance diet and activities and see how she does.   Will hold off on any further imaging today.  Shallow gastric ulcers. Seen on upper endo 05/28/2014. On Carafate, twice a day per [employer], amoxicillin and clarithromycin due to H. pylori positive. Doubt that shallow gastric ulcers are continuing to her pain.  No evidence of acute symptomatic biliary tract disease.  DVT prophylaxis. SCDs and Lovenox. Ambulate.     LOS: 7 days    Valiant Dills M 06/02/2014

## 2014-06-02 NOTE — Progress Notes (Signed)
Michelle Hurley 10:26 AM  Subjective: Patient doing better today and able to have soft solids and her case was discussed with Dr. Dalbert Batman and she has no new complaints and is in even better spirits today  Objective: Vital signs stable afebrile no acute distress labs stable abdomen soft nontender good bowel sounds  Assessment: Abdominal pain nausea vomiting probably due to ischemic small bowel short segment  Plan: Surgical options per Dr. Dalbert Batman particularly if patient fails diet advancement and please let me know if an attempt at enteroscope is needed otherwise consider CT enterography as well however will leave decision to surgical team and be on standby to help when necessary  [facility]  Pager [phone] After 5PM or if no answer call [phone]

## 2014-06-03 ENCOUNTER — Encounter (HOSPITAL_COMMUNITY): Payer: Self-pay | Admitting: Radiology

## 2014-06-03 ENCOUNTER — Ambulatory Visit (HOSPITAL_COMMUNITY): Payer: Commercial Managed Care - HMO

## 2014-06-03 LAB — CBC WITH DIFFERENTIAL/PLATELET
BASOS ABS: 0 10*3/uL (ref 0.0–0.1)
Basophils Relative: 0 % (ref 0–1)
Eosinophils Absolute: 0 10*3/uL (ref 0.0–0.7)
Eosinophils Relative: 1 % (ref 0–5)
HCT: 36.4 % (ref 36.0–46.0)
Hemoglobin: 12 g/dL (ref 12.0–15.0)
LYMPHS ABS: 1.6 10*3/uL (ref 0.7–4.0)
LYMPHS PCT: 53 % — AB (ref 12–46)
MCH: 26.6 pg (ref 26.0–34.0)
MCHC: 33 g/dL (ref 30.0–36.0)
MCV: 80.7 fL (ref 78.0–100.0)
MONO ABS: 0.4 10*3/uL (ref 0.1–1.0)
Monocytes Relative: 12 % (ref 3–12)
NEUTROS ABS: 1.1 10*3/uL — AB (ref 1.7–7.7)
Neutrophils Relative %: 34 % — ABNORMAL LOW (ref 43–77)
Platelets: 225 10*3/uL (ref 150–400)
RBC: 4.51 MIL/uL (ref 3.87–5.11)
RDW: 13.9 % (ref 11.5–15.5)
WBC: 3.1 10*3/uL — AB (ref 4.0–10.5)

## 2014-06-03 LAB — BASIC METABOLIC PANEL
ANION GAP: 11 (ref 5–15)
BUN: 11 mg/dL (ref 6–20)
CALCIUM: 8.6 mg/dL — AB (ref 8.9–10.3)
CHLORIDE: 100 mmol/L — AB (ref 101–111)
CO2: 23 mmol/L (ref 22–32)
Creatinine, Ser: 1.42 mg/dL — ABNORMAL HIGH (ref 0.44–1.00)
GFR calc Af Amer: 44 mL/min — ABNORMAL LOW (ref >60)
GFR, EST NON AFRICAN AMERICAN: 38 mL/min — AB (ref >60)
Glucose, Bld: 96 mg/dL (ref 65–99)
Potassium: 2.8 mmol/L — ABNORMAL LOW (ref 3.5–5.1)
Sodium: 134 mmol/L — ABNORMAL LOW (ref 135–145)

## 2014-06-03 LAB — MRSA PCR SCREENING: MRSA by PCR: NEGATIVE

## 2014-06-03 MED ORDER — CHLORHEXIDINE GLUCONATE 4 % EX LIQD
1.0000 "application " | Freq: Once | CUTANEOUS | Status: AC
  Administered 2014-06-04: 1 via TOPICAL
  Filled 2014-06-03: qty 15

## 2014-06-03 MED ORDER — IOHEXOL 300 MG/ML  SOLN
100.0000 mL | Freq: Once | INTRAMUSCULAR | Status: AC | PRN
  Administered 2014-06-03: 100 mL via INTRAVENOUS

## 2014-06-03 MED ORDER — ALVIMOPAN 12 MG PO CAPS
12.0000 mg | ORAL_CAPSULE | Freq: Once | ORAL | Status: AC
  Administered 2014-06-04: 12 mg via ORAL
  Filled 2014-06-03: qty 1

## 2014-06-03 MED ORDER — POTASSIUM CHLORIDE 10 MEQ/100ML IV SOLN
10.0000 meq | INTRAVENOUS | Status: AC
  Administered 2014-06-03 (×4): 10 meq via INTRAVENOUS
  Filled 2014-06-03 (×4): qty 100

## 2014-06-03 MED ORDER — POTASSIUM CHLORIDE 2 MEQ/ML IV SOLN
INTRAVENOUS | Status: DC
  Administered 2014-06-03 – 2014-06-04 (×4): via INTRAVENOUS
  Filled 2014-06-03 (×7): qty 1000

## 2014-06-03 NOTE — Care Management Note (Signed)
Case Management Note  Patient Details  Name: Michelle Hurley MRN: [id number] Date of Birth: 1950-03-30  Subjective/Objective:    64 yo female with postop nausea and vomiting                Action/Plan: Patient stated that she lives at home alone, independently but she will be having a friend stay with her once she is discharged from the hospital. She also stated that her daughter is trying to fly to town from [place]. No needs at this time. Will continue to follow.  Expected Discharge Date:                  Expected Discharge Plan:  Home/Self Care  In-House Referral:     Discharge planning Services  CM Consult  Post Acute Care Choice:  NA Choice offered to:  NA  DME Arranged:    DME Agency:     HH Arranged:    [place]:     Status of Service:     Medicare Important Message Given:  Yes Date Medicare IM Given:  06/03/14 Medicare IM give by:  Leanne Chang Date Additional Medicare IM Given:    Additional Medicare Important Message give by:     If discussed at [place] of Stay Meetings, dates discussed:    Additional Comments:  Scot Dock, RN 06/03/2014, 10:34 AM

## 2014-06-03 NOTE — Progress Notes (Signed)
6 Days Post-Op  Subjective: Had some crampy abdominal pain after 8 PM last night. This has now resolved. Tolerating full liquids. No nausea or vomiting. Had 3 or 4 small loose stools yesterday. Says she did eat some grits and eggs yesterday.  Objective: Vital signs in last 24 hours: Temp:  [98.1 F (36.7 C)-98.2 F (36.8 C)] 98.2 F (36.8 C) (05/24 2146) Pulse Rate:  [64-66] 66 (05/24 2146) Resp:  [18-20] 18 (05/24 2146) BP: (106-123)/(63-84) 106/63 mmHg (05/24 2146) SpO2:  [100 %] 100 % (05/24 2146) Last BM Date: 06/02/14  Intake/Output from previous day: 05/24 0701 - 05/25 0700 In: 600 [P.O.:600] Out: 900 [Urine:900] Intake/Output this shift: Total I/O In: 360 [P.O.:360] Out: -   General appearance: Alert. Cooperative. In no physical distress. Resp: clear to auscultation bilaterally GI: Soft. Not distended. Nontender. Upper abdominal incision clean. Completed benign exam.  Lab Results:   Recent Labs  05/31/14 1341 06/02/14 0515  WBC 3.6* 2.9*  HGB 15.1* 12.2  HCT 46.0 38.2  PLT 161 231   BMET  Recent Labs  05/31/14 1451 06/02/14 0515  NA 134* 136  K 4.0 3.1*  CL 100* 102  CO2 24 21*  GLUCOSE 108* 89  BUN 8 12  CREATININE 1.46* 1.35*  CALCIUM 9.4 8.8*   PT/INR No results for input(s): LABPROT, INR in the last 72 hours. ABG No results for input(s): PHART, HCO3 in the last 72 hours.  Invalid input(s): PCO2, PO2  Studies/Results: Nm Hepato W/eject Fract  06/01/2014   CLINICAL DATA:  Right upper quadrant abdominal pain  EXAM: NUCLEAR MEDICINE HEPATOBILIARY IMAGING WITH GALLBLADDER EF  TECHNIQUE: Sequential images of the abdomen were obtained out to 60 minutes following intravenous administration of radiopharmaceutical. After slow intravenous infusion of 1.2 micrograms Cholecystokinin, gallbladder ejection fraction was determined.  RADIOPHARMACEUTICALS:  5.0 mCi Technetium-25m Choletec IV  COMPARISON:  Abdominal and pelvic CT scan of May 31, 2014   FINDINGS: There is adequate uptake of the radiopharmaceutical by the liver. Activity is visible within the intrahepatic and common bile duct by 5 minutes. Bowel activity is visible by 15 minutes. The gallbladder is visualized by 35 minutes. Following CCK administration the 45 minutes ejection fraction is 85% which is normal. The patient did experience discomfort with CCK administration. At 45 min, normal ejection fraction is greater than 40%.  IMPRESSION: Normal hepatobiliary scan with normal gallbladder ejection fraction.   Electronically Signed   By: David  Martinique M.D.   On: 06/01/2014 14:14    Anti-infectives: Anti-infectives    Start     Dose/Rate Route Frequency Ordered Stop   05/31/14 1045  amoxicillin (AMOXIL) capsule 1,000 mg     1,000 mg Oral Every 12 hours 05/31/14 1035     05/31/14 1045  clarithromycin (BIAXIN) tablet 500 mg     500 mg Oral Every 12 hours 05/31/14 1035     05/29/14 1230  fluconazole (DIFLUCAN) tablet 200 mg     200 mg Oral  Once 05/29/14 1222 05/29/14 1352      Assessment/Plan: s/p Procedure(s): ESOPHAGOGASTRODUODENOSCOPY (EGD)  Abdominal pain and vomiting. Etiology not established but the most probable cause is low-grade partial obstruction from a segment of jejunum seen on CT which probably correlates with the incarcerated segment seen at surgery on April 25.  Transient crampy pain last night. Asymptomatic now.Physical exam benign.  Proceed with CT enterography now  Patient is aware that this segment of small bowel may heal and her symptoms may resolve without intervention. Alternatively.,  I have explained to her that this segment of small bowel may stricture and cause recurrent symptoms, necessitating laparoscopy, laparotomy, possible small bowel resection.    Shallow gastric ulcers. Seen on upper endo 05/28/2014. On Carafate, twice a day per [employer], amoxicillin and clarithromycin due to H. pylori positive. Doubt that shallow gastric ulcers are  continuing to her pain.  No evidence of acute symptomatic biliary tract disease.  DVT prophylaxis. SCDs and Lovenox. Ambulate.   LOS: 8 days    Woodroe Vogan M 06/03/2014

## 2014-06-03 NOTE — Progress Notes (Addendum)
General Surgery:  CT enterography again shows short segment of small bowel that appears strictured and enhances. The small bowel proximal to this is more dilated than it was this past weekend and  the distal bowel is collapsed.  This looks like a higher grade obstruction and before.    Also again noted is a small 4 mm. right middle lobe pulmonary nodule and follow-up chest CT at 1 year is recommended.  I discussed this with her. She feels very full after the procedure but has not vomited not having much pain. I advised her that I thought that this was a  fixed high-grade partial obstruction, most likely secondary to small bowel injury from its incarceration in the hernia last month. I told her that my opinion was to take her to the operating room and resect this segment of bowel. After lengthy discussion she completely agrees. I discussed this with her and with her daughter by phone.    She will be scheduled for laparoscopic assisted small bowel resection, possible open resection tomorrow. I discussed the indications, details, techniques, and numerous risk of the surgery with her. She's aware the risk of bleeding, infection, recurrent hernia, anastomotic leak, injury to adjacent organs with major reconstructive surgery or reoperation for sepsis. At this time she understands all these issues and all of her questions are answered. She is in full agreement with this plan.   Edsel Petrin. Dalbert Batman, M.D., [facility] General and Minimally invasive Surgery Breast and Colorectal Surgery Office:   [phone]

## 2014-06-04 ENCOUNTER — Inpatient Hospital Stay (HOSPITAL_COMMUNITY): Payer: Commercial Managed Care - HMO | Admitting: Anesthesiology

## 2014-06-04 ENCOUNTER — Encounter (HOSPITAL_COMMUNITY): Payer: Self-pay | Admitting: Anesthesiology

## 2014-06-04 ENCOUNTER — Encounter (HOSPITAL_COMMUNITY): Admission: EM | Disposition: A | Discharge status: Home / Self Care | Payer: Self-pay | Source: Home / Self Care

## 2014-06-04 HISTORY — PX: LAPAROSCOPIC SMALL BOWEL RESECTION: SHX5929

## 2014-06-04 LAB — CBC
HCT: 37.7 % (ref 36.0–46.0)
Hemoglobin: 12 g/dL (ref 12.0–15.0)
MCH: 25.9 pg — ABNORMAL LOW (ref 26.0–34.0)
MCHC: 31.8 g/dL (ref 30.0–36.0)
MCV: 81.3 fL (ref 78.0–100.0)
PLATELETS: 242 10*3/uL (ref 150–400)
RBC: 4.64 MIL/uL (ref 3.87–5.11)
RDW: 14 % (ref 11.5–15.5)
WBC: 3.8 10*3/uL — ABNORMAL LOW (ref 4.0–10.5)

## 2014-06-04 LAB — BASIC METABOLIC PANEL
Anion gap: 11 (ref 5–15)
BUN: 8 mg/dL (ref 6–20)
CHLORIDE: 107 mmol/L (ref 101–111)
CO2: 19 mmol/L — ABNORMAL LOW (ref 22–32)
CREATININE: 1.07 mg/dL — AB (ref 0.44–1.00)
Calcium: 8.9 mg/dL (ref 8.9–10.3)
GFR calc Af Amer: >60 mL/min (ref >60)
GFR, EST NON AFRICAN AMERICAN: 53 mL/min — AB (ref >60)
Glucose, Bld: 80 mg/dL (ref 65–99)
Potassium: 4.2 mmol/L (ref 3.5–5.1)
SODIUM: 137 mmol/L (ref 135–145)

## 2014-06-04 SURGERY — EXCISION, SMALL INTESTINE, LAPAROSCOPIC
Anesthesia: General | Site: Abdomen

## 2014-06-04 MED ORDER — ONDANSETRON HCL 4 MG/2ML IJ SOLN
INTRAMUSCULAR | Status: AC
  Filled 2014-06-04: qty 2

## 2014-06-04 MED ORDER — METHOCARBAMOL 1000 MG/10ML IJ SOLN
1000.0000 mg | Freq: Three times a day (TID) | INTRAVENOUS | Status: DC | PRN
  Administered 2014-06-04 (×2): 1000 mg via INTRAVENOUS
  Filled 2014-06-04 (×5): qty 10

## 2014-06-04 MED ORDER — BUPIVACAINE-EPINEPHRINE 0.25% -1:200000 IJ SOLN
INTRAMUSCULAR | Status: DC | PRN
Start: 2014-06-04 — End: 2014-06-04
  Administered 2014-06-04: 17 mL

## 2014-06-04 MED ORDER — DEXTROSE 5 % IV SOLN
2.0000 g | INTRAVENOUS | Status: DC | PRN
  Administered 2014-06-04: 2 g via INTRAVENOUS

## 2014-06-04 MED ORDER — FENTANYL CITRATE (PF) 250 MCG/5ML IJ SOLN
INTRAMUSCULAR | Status: AC
  Filled 2014-06-04: qty 5

## 2014-06-04 MED ORDER — LACTATED RINGERS IV SOLN
INTRAVENOUS | Status: DC | PRN
  Administered 2014-06-04 (×2): via INTRAVENOUS

## 2014-06-04 MED ORDER — NEOSTIGMINE METHYLSULFATE 10 MG/10ML IV SOLN
INTRAVENOUS | Status: DC | PRN
  Administered 2014-06-04: 4 mg via INTRAVENOUS

## 2014-06-04 MED ORDER — DEXAMETHASONE SODIUM PHOSPHATE 10 MG/ML IJ SOLN
INTRAMUSCULAR | Status: DC | PRN
  Administered 2014-06-04: 10 mg via INTRAVENOUS

## 2014-06-04 MED ORDER — LIDOCAINE HCL (CARDIAC) 20 MG/ML IV SOLN
INTRAVENOUS | Status: DC | PRN
  Administered 2014-06-04: 50 mg via INTRAVENOUS

## 2014-06-04 MED ORDER — ENOXAPARIN SODIUM 40 MG/0.4ML ~~LOC~~ SOLN
40.0000 mg | SUBCUTANEOUS | Status: DC
  Administered 2014-06-05 – 2014-06-08 (×4): 40 mg via SUBCUTANEOUS
  Filled 2014-06-04 (×4): qty 0.4

## 2014-06-04 MED ORDER — NEOSTIGMINE METHYLSULFATE 10 MG/10ML IV SOLN
INTRAVENOUS | Status: AC
  Filled 2014-06-04: qty 1

## 2014-06-04 MED ORDER — ROCURONIUM BROMIDE 100 MG/10ML IV SOLN
INTRAVENOUS | Status: DC | PRN
  Administered 2014-06-04: 30 mg via INTRAVENOUS
  Administered 2014-06-04 (×3): 5 mg via INTRAVENOUS

## 2014-06-04 MED ORDER — GLYCOPYRROLATE 0.2 MG/ML IJ SOLN
INTRAMUSCULAR | Status: DC | PRN
  Administered 2014-06-04: 0.6 mg via INTRAVENOUS

## 2014-06-04 MED ORDER — FENTANYL CITRATE (PF) 100 MCG/2ML IJ SOLN
INTRAMUSCULAR | Status: DC | PRN
  Administered 2014-06-04 (×4): 25 ug via INTRAVENOUS
  Administered 2014-06-04: 150 ug via INTRAVENOUS

## 2014-06-04 MED ORDER — ALVIMOPAN 12 MG PO CAPS
12.0000 mg | ORAL_CAPSULE | Freq: Two times a day (BID) | ORAL | Status: DC
  Administered 2014-06-04 – 2014-06-06 (×4): 12 mg via ORAL
  Filled 2014-06-04 (×5): qty 1

## 2014-06-04 MED ORDER — ARTIFICIAL TEARS OP OINT
TOPICAL_OINTMENT | OPHTHALMIC | Status: AC
Start: 2014-06-04 — End: 2014-06-04
  Filled 2014-06-04: qty 3.5

## 2014-06-04 MED ORDER — BUPIVACAINE-EPINEPHRINE (PF) 0.25% -1:200000 IJ SOLN
INTRAMUSCULAR | Status: AC
  Filled 2014-06-04: qty 30

## 2014-06-04 MED ORDER — FENTANYL CITRATE (PF) 100 MCG/2ML IJ SOLN
INTRAMUSCULAR | Status: AC
  Filled 2014-06-04: qty 2

## 2014-06-04 MED ORDER — HYDROMORPHONE HCL 2 MG/ML IJ SOLN
2.0000 mg | INTRAMUSCULAR | Status: AC
  Administered 2014-06-04: 2 mg via INTRAVENOUS
  Filled 2014-06-04: qty 1

## 2014-06-04 MED ORDER — ACETAMINOPHEN 10 MG/ML IV SOLN
1000.0000 mg | Freq: Once | INTRAVENOUS | Status: AC
  Administered 2014-06-04: 1000 mg via INTRAVENOUS
  Filled 2014-06-04: qty 100

## 2014-06-04 MED ORDER — CEFOTETAN DISODIUM-DEXTROSE 2-2.08 GM-% IV SOLR
INTRAVENOUS | Status: AC
  Filled 2014-06-04: qty 50

## 2014-06-04 MED ORDER — DEXTROSE 5 % IV SOLN
2.0000 g | Freq: Once | INTRAVENOUS | Status: AC
  Administered 2014-06-04: 2 g via INTRAVENOUS
  Filled 2014-06-04: qty 2

## 2014-06-04 MED ORDER — BUPIVACAINE-EPINEPHRINE (PF) 0.5% -1:200000 IJ SOLN
INTRAMUSCULAR | Status: AC
  Filled 2014-06-04: qty 30

## 2014-06-04 MED ORDER — EPHEDRINE SULFATE 50 MG/ML IJ SOLN
INTRAMUSCULAR | Status: DC | PRN
  Administered 2014-06-04: 10 mg via INTRAVENOUS
  Administered 2014-06-04 (×3): 5 mg via INTRAVENOUS

## 2014-06-04 MED ORDER — FENTANYL CITRATE (PF) 100 MCG/2ML IJ SOLN
25.0000 ug | INTRAMUSCULAR | Status: DC | PRN
  Administered 2014-06-04 (×2): 50 ug via INTRAVENOUS

## 2014-06-04 MED ORDER — HYDROMORPHONE HCL 1 MG/ML IJ SOLN
1.0000 mg | INTRAMUSCULAR | Status: DC | PRN
Start: 2014-06-04 — End: 2014-06-08
  Administered 2014-06-04: 1 mg via INTRAVENOUS
  Administered 2014-06-04: 2 mg via INTRAVENOUS
  Administered 2014-06-05: 1.5 mg via INTRAVENOUS
  Filled 2014-06-04 (×3): qty 1
  Filled 2014-06-04: qty 2

## 2014-06-04 MED ORDER — ROCURONIUM BROMIDE 100 MG/10ML IV SOLN
INTRAVENOUS | Status: AC
  Filled 2014-06-04: qty 1

## 2014-06-04 MED ORDER — SUCCINYLCHOLINE CHLORIDE 20 MG/ML IJ SOLN
INTRAMUSCULAR | Status: DC | PRN
  Administered 2014-06-04: 100 mg via INTRAVENOUS

## 2014-06-04 MED ORDER — GLYCOPYRROLATE 0.2 MG/ML IJ SOLN
INTRAMUSCULAR | Status: AC
  Filled 2014-06-04: qty 2

## 2014-06-04 MED ORDER — PROPOFOL 10 MG/ML IV BOLUS
INTRAVENOUS | Status: DC | PRN
  Administered 2014-06-04: 150 mg via INTRAVENOUS

## 2014-06-04 MED ORDER — 0.9 % SODIUM CHLORIDE (POUR BTL) OPTIME
TOPICAL | Status: DC | PRN
  Administered 2014-06-04: 1000 mL

## 2014-06-04 SURGICAL SUPPLY — 47 items
APL SKNCLS STERI-STRIP NONHPOA (GAUZE/BANDAGES/DRESSINGS)
BENZOIN TINCTURE PRP APPL 2/3 (GAUZE/BANDAGES/DRESSINGS) IMPLANT
CLOSURE WOUND 1/2 X4 (GAUZE/BANDAGES/DRESSINGS)
DECANTER SPIKE VIAL GLASS SM (MISCELLANEOUS) IMPLANT
DRAPE LAPAROSCOPIC ABDOMINAL (DRAPES) ×3 IMPLANT
DRAPE POUCH INSTRU U-SHP 10X18 (DRAPES) ×1 IMPLANT
DRSG OPSITE POSTOP 4X6 (GAUZE/BANDAGES/DRESSINGS) ×2 IMPLANT
DRSG TELFA 3X8 NADH (GAUZE/BANDAGES/DRESSINGS) ×3 IMPLANT
ELECT REM PT RETURN 9FT ADLT (ELECTROSURGICAL) ×3
ELECTRODE REM PT RTRN 9FT ADLT (ELECTROSURGICAL) ×1 IMPLANT
GAUZE SPONGE 4X4 12PLY STRL (GAUZE/BANDAGES/DRESSINGS) ×2 IMPLANT
GLOVE BIO SURGEON STRL SZ 6.5 (GLOVE) ×1 IMPLANT
GLOVE BIO SURGEON STRL SZ7 (GLOVE) ×2 IMPLANT
GLOVE BIO SURGEON STRL SZ7.5 (GLOVE) ×2 IMPLANT
GLOVE BIO SURGEONS STRL SZ 6.5 (GLOVE) ×1
GLOVE BIOGEL PI IND STRL 7.0 (GLOVE) ×1 IMPLANT
GLOVE BIOGEL PI IND STRL 7.5 (GLOVE) IMPLANT
GLOVE BIOGEL PI INDICATOR 7.0 (GLOVE) ×8
GLOVE BIOGEL PI INDICATOR 7.5 (GLOVE) ×2
GLOVE EUDERMIC 7 POWDERFREE (GLOVE) ×3 IMPLANT
GOWN STRL NON-REIN LRG LVL3 (GOWN DISPOSABLE) ×2 IMPLANT
GOWN STRL REUS W/TWL LRG LVL3 (GOWN DISPOSABLE) ×5 IMPLANT
GOWN STRL REUS W/TWL XL LVL3 (GOWN DISPOSABLE) ×6 IMPLANT
KIT BASIN OR (CUSTOM PROCEDURE TRAY) ×3 IMPLANT
LIGASURE IMPACT 36 18CM CVD LR (INSTRUMENTS) ×2 IMPLANT
PAD DRESSING TELFA 3X8 NADH (GAUZE/BANDAGES/DRESSINGS) IMPLANT
RELOAD PROXIMATE 75MM BLUE (ENDOMECHANICALS) ×6 IMPLANT
RELOAD STAPLE 75 3.8 BLU REG (ENDOMECHANICALS) IMPLANT
SET IRRIG TUBING LAPAROSCOPIC (IRRIGATION / IRRIGATOR) ×2 IMPLANT
SHEARS HARMONIC ACE PLUS 36CM (ENDOMECHANICALS) IMPLANT
SLEEVE XCEL OPT CAN 5 100 (ENDOMECHANICALS) ×4 IMPLANT
SOLUTION ANTI FOG 6CC (MISCELLANEOUS) ×1 IMPLANT
STAPLER GUN LINEAR PROX 60 (STAPLE) ×2 IMPLANT
STAPLER PROXIMATE 75MM BLUE (STAPLE) ×2 IMPLANT
STRIP CLOSURE SKIN 1/2X4 (GAUZE/BANDAGES/DRESSINGS) IMPLANT
SUT NOVA 1 T20/GS 25DT (SUTURE) ×4 IMPLANT
SUT SILK 3 0 SH CR/8 (SUTURE) ×2 IMPLANT
SUT VIC AB 4-0 PS2 27 (SUTURE) IMPLANT
TOWEL OR 17X26 10 PK STRL BLUE (TOWEL DISPOSABLE) ×3 IMPLANT
TRAY FOLEY W/METER SILVER 14FR (SET/KITS/TRAYS/PACK) ×2 IMPLANT
TRAY LAPAROSCOPIC (CUSTOM PROCEDURE TRAY) ×3 IMPLANT
TROCAR BLADELESS OPT 5 75 (ENDOMECHANICALS) ×2 IMPLANT
TROCAR XCEL BLUNT TIP 100MML (ENDOMECHANICALS) ×3 IMPLANT
TROCAR XCEL NON-BLD 11X100MML (ENDOMECHANICALS) IMPLANT
TROCAR XCEL UNIV SLVE 11M 100M (ENDOMECHANICALS) IMPLANT
TUBING INSUFFLATION 10FT LAP (TUBING) ×1 IMPLANT
WATER STERILE IRR 1500ML POUR (IV SOLUTION) ×3 IMPLANT

## 2014-06-04 NOTE — Anesthesia Procedure Notes (Signed)
Procedure Name: Intubation Date/Time: 06/04/2014 10:10 AM Performed by: Markos Theil, Virgel Gess Pre-anesthesia Checklist: Patient identified, Emergency Drugs available, Suction available, Patient being monitored and Timeout performed Patient Re-evaluated:Patient Re-evaluated prior to inductionOxygen Delivery Method: Circle system utilized Preoxygenation: Pre-oxygenation with 100% oxygen Intubation Type: IV induction Ventilation: Mask ventilation without difficulty Laryngoscope Size: Mac and 3 Grade View: Grade I Tube type: Oral Tube size: 7.5 mm Number of attempts: 1 Airway Equipment and Method: Stylet Placement Confirmation: ETT inserted through vocal cords under direct vision,  positive ETCO2,  CO2 detector and breath sounds checked- equal and bilateral Secured at: 22 cm Tube secured with: Tape Dental Injury: Teeth and Oropharynx as per pre-operative assessment

## 2014-06-04 NOTE — Anesthesia Preprocedure Evaluation (Signed)
Anesthesia Evaluation  Patient identified by MRN, date of birth, ID band  History of Anesthesia Complications (+) PONV  Airway Mallampati: II  TM Distance: >3 FB Neck ROM: Full    Dental   Pulmonary neg pulmonary ROS,  breath sounds clear to auscultation        Cardiovascular hypertension, Rhythm:Regular Rate:Normal     Neuro/Psych    GI/Hepatic negative GI ROS, Neg liver ROS,   Endo/Other  negative endocrine ROS  Renal/GU      Musculoskeletal   Abdominal   Peds  Hematology   Anesthesia Other Findings   Reproductive/Obstetrics                             Anesthesia Physical Anesthesia Plan  ASA: III  Anesthesia Plan: General   Post-op Pain Management:    Induction: Intravenous  Airway Management Planned: Oral ETT  Additional Equipment:   Intra-op Plan:   Post-operative Plan: Extubation in OR  Informed Consent: I have reviewed the patients History and Physical, chart, labs and discussed the procedure including the risks, benefits and alternatives for the proposed anesthesia with the patient or authorized representative who has indicated his/her understanding and acceptance.   Dental advisory given  Plan Discussed with: CRNA, Anesthesiologist and Surgeon  Anesthesia Plan Comments:         Anesthesia Quick Evaluation

## 2014-06-04 NOTE — Op Note (Signed)
Patient Name:           Michelle Hurley   Date of Surgery:        06/04/2014  Pre op Diagnosis:      Small bowel obstruction  Post op Diagnosis:    Small bowel obstruction due to focal adhesion and stricture of mid jejunum  Procedure:                 Laparoscopic-assisted small bowel resection  Surgeon:                     Edsel Petrin. Dalbert Batman, M.D., FACS  Assistant:                      Jomarie Longs, [place]  Operative Indications:   This is a 64 year old Afro-American female who underwent emergent surgery about one month ago. Dr. Lucia Gaskins performed diagnostic laparoscopy, open repair of incarcerated epigastric hernia. He found a short segment of small bowel which was discolored but looked better after it was released and the hernia was repaired in the midline with primary Novafil sutures. She initially did well was discharged home. She has been readmitted twice with postprandial abdominal pain nausea and vomiting. Upper endoscopy shows superficial gastric ulcers and she is H. pylori positive. She is being treated for that. CT scan shows a high-grade partial obstruction due to a short segment of small bowel which appears somewhat strictured or inflamed. There is no evidence of recurrent hernia. It is theorized that this was the segment of bowel that was entrapped in the hernia and is now causing the obstruction.  Operative Findings:       There was a segment of mid jejunum causing the obstruction. The small bowel was folded over on itself with adhesions between 2 loops. It felt thickened and strictured but it was pink and viable. There is no evidence of perforation or peritonitis. The diseased segment was probably about 6 cm in length and I probably resected 4-6 cm beyond this both proximally and distally. The small bowel was examined from the ligament of Treitz to  the distal ileum and found no other disease process. The liver, stomach, pylorus, transverse colon otherwise looks normal. The hernia repair  was intact.  Procedure in Detail:          Following the induction of general endotracheal anesthesia an oral gastric tube and a Foley catheter were inserted. The abdomen was prepped and draped in a sterile fashion. Surgical timeout was performed. Intravenous antibiotics were given. 0.5% Marcaine with epinephrine was used as a local infiltration anesthetic.     A 5 mm optical trocar was placed in the right upper quadrant. Entry was uneventful. Pneumoperitoneum was created.  Video camera was inserted. I placed a 5 mm trocar in the midline below the umbilicus and a 5 mm trocar in the right mid lateral abdomen. Exploration was carried out with findings as described above. I ran the small bowel from the ligament of Treitz distally and then  all the way back to ligament of Treitz. I felt that I identified the point of obstruction. This was controlled with a grasper. I then reopened the midline incision in the upper abdomen where the epigastric hernia had been repaired. The fascia was incised slowly with cautery and knife removing the old Novafil sutures. I was able to enter the abdominal cavity under good control. I then delivered the loop of small bowel into the wound. I decided  on limits of resection and transected the small bowel proximally and distally with a GIA stapling device. The mesentery was divided with the LigaSure device. The specimen was passed off and sent to the lab. Anastomosis was created with an GIA stapling device and the common defect looked good without bleeding and was closed with a TA 60 stapling device. We closed the mesentery with interrupted silk sutures. The anastomosis was checked. I placed silk sutures at a few critical points along the staple line. The small bowel was returned to its anatomic position and the upper abdomen irrigated.    We changed over our gowns gloves instruments and drapes. The midline fascia was closed with interrupted sutures of #1 Novafil. Pneumoperitoneum was  created and we examined the operative field and found no evidence of bleeding. We looked at the anastomosis and it looked healthy without any signs of leakage or bleeding. We released the pneumoperitoneum and removed the trochars. We irrigated the wounds and closed the skin with skin staples and placed Telfa wicks in between. Clean bandages were placed and the patient taken to PACU in stable condition. EBL 30 40 mL. Counts correct. Complications none.    Edsel Petrin. Dalbert Batman, M.D., FACS General and Minimally Invasive Surgery Breast and Colorectal Surgery  06/04/2014 11:53 AM

## 2014-06-04 NOTE — Progress Notes (Signed)
Patient reporting pain not relieved by morphine.  Dr. Dalbert Batman notified, with orders received for Dilaudid.  PA's on floor to assess pain.

## 2014-06-04 NOTE — Progress Notes (Addendum)
Chaplain saw pt while rounding on unit.  Provided support with pt at bedside around lengthy illness, readmission / recent surgery.  Pt requested prayers for pain.  Pt's daughter present.  Pt requests follow up for continued support.  [place], [place]

## 2014-06-04 NOTE — Anesthesia Postprocedure Evaluation (Signed)
  Anesthesia Post-op Note  Patient: Michelle Hurley  Procedure(s) Performed: Procedure(s): LAPAROSCOPIC SMALL BOWEL RESECTION (N/A)  Patient Location: PACU  Anesthesia Type:General  Level of Consciousness: awake  Airway and Oxygen Therapy: Patient Spontanous Breathing  Post-op Pain: mild  Post-op Assessment: Post-op Vital signs reviewed  Post-op Vital Signs: Reviewed  Last Vitals:  Filed Vitals:   06/04/14 1446  BP: 143/84  Pulse: 70  Temp: 36.4 C  Resp: 20    Complications: No apparent anesthesia complications

## 2014-06-04 NOTE — Transfer of Care (Signed)
Immediate Anesthesia Transfer of Care Note  Patient: Michelle Hurley  Procedure(s) Performed: Procedure(s): LAPAROSCOPIC SMALL BOWEL RESECTION (N/A)  Patient Location: PACU  Anesthesia Type:General  Level of Consciousness:  sedated, patient cooperative and responds to stimulation  Airway & Oxygen Therapy:Patient Spontanous Breathing and Patient connected to face mask oxgen  Post-op Assessment:  Report given to PACU RN and Post -op Vital signs reviewed and stable  Post vital signs:  Reviewed and stable  Last Vitals:  Filed Vitals:   06/04/14 0452  BP: 117/76  Pulse: 71  Temp: 36.7 C  Resp: 20    Complications: No apparent anesthesia complications

## 2014-06-04 NOTE — Progress Notes (Signed)
Gen. Surgery:  She had increased diarrhea yesterday, presumably secondary to the CT contrast. This is slowing down. Minimal background abdominal discomfort. No cramps, nausea.  Abdomen is soft this morning. Objectively not tender.  We plan to proceed to the OR today for a laparoscopic assisted small bowel resection. We discussed this again this morning in detail. She is ready to go ahead with this. All of her questions have been answered.   Michelle Hurley. Dalbert Batman, M.D., [facility] General and Minimally invasive Surgery Breast and Colorectal Surgery Office:   [phone]

## 2014-06-05 ENCOUNTER — Encounter (HOSPITAL_COMMUNITY): Payer: Self-pay | Admitting: General Surgery

## 2014-06-05 LAB — BASIC METABOLIC PANEL
Anion gap: 11 (ref 5–15)
BUN: 14 mg/dL (ref 6–20)
CALCIUM: 8.9 mg/dL (ref 8.9–10.3)
CO2: 19 mmol/L — ABNORMAL LOW (ref 22–32)
Chloride: 107 mmol/L (ref 101–111)
Creatinine, Ser: 1.1 mg/dL — ABNORMAL HIGH (ref 0.44–1.00)
GFR calc Af Amer: 60 mL/min — ABNORMAL LOW (ref >60)
GFR, EST NON AFRICAN AMERICAN: 52 mL/min — AB (ref >60)
GLUCOSE: 100 mg/dL — AB (ref 65–99)
POTASSIUM: 4.8 mmol/L (ref 3.5–5.1)
Sodium: 137 mmol/L (ref 135–145)

## 2014-06-05 LAB — CBC
HEMATOCRIT: 37.8 % (ref 36.0–46.0)
HEMOGLOBIN: 12.2 g/dL (ref 12.0–15.0)
MCH: 26.4 pg (ref 26.0–34.0)
MCHC: 32.3 g/dL (ref 30.0–36.0)
MCV: 81.8 fL (ref 78.0–100.0)
Platelets: 199 10*3/uL (ref 150–400)
RBC: 4.62 MIL/uL (ref 3.87–5.11)
RDW: 14.3 % (ref 11.5–15.5)
WBC: 5.8 10*3/uL (ref 4.0–10.5)

## 2014-06-05 MED ORDER — POTASSIUM CHLORIDE IN NACL 40-0.9 MEQ/L-% IV SOLN
INTRAVENOUS | Status: DC
  Administered 2014-06-05 (×2): 100 mL/h via INTRAVENOUS
  Administered 2014-06-06: 50 mL/h via INTRAVENOUS
  Filled 2014-06-05 (×7): qty 1000

## 2014-06-05 MED ORDER — GUAIFENESIN 100 MG/5ML PO SOLN
200.0000 mg | Freq: Four times a day (QID) | ORAL | Status: DC | PRN
  Administered 2014-06-05: 200 mg via ORAL
  Filled 2014-06-05: qty 10

## 2014-06-05 NOTE — Progress Notes (Signed)
1 Day Post-Op  Subjective: Stable and alert. Denies nausea. Has been ambulating. Pain control good. I explained operative procedure and findings with patient and family. Hemoglobin 12.2. WBC 5800. Potassium 4.8. Creatinine 1.1. Glucose 100.  Objective: Vital signs in last 24 hours: Temp:  [97.5 F (36.4 C)-98 F (36.7 C)] 97.5 F (36.4 C) (05/26 2221) Pulse Rate:  [70-78] 78 (05/26 2221) Resp:  [11-20] 18 (05/26 2221) BP: (110-143)/(66-84) 110/77 mmHg (05/26 2221) SpO2:  [92 %-100 %] 92 % (05/26 2221) Last BM Date: 06/03/14  Intake/Output from previous day: 05/26 0701 - 05/27 0700 In: 3325 [I.V.:3325] Out: 1150 [Urine:1100; Blood:50] Intake/Output this shift: Total I/O In: -  Out: 500 [Urine:500]  General appearance: Alert. Very pleasant. No distress. Mental status normal. Resp: clear to auscultation bilaterally GI: Soft. Nondistended. Wounds clean. Hypoactive bowel sounds. Appropriate incisional tenderness.  Lab Results:  Results for orders placed or performed during the hospital encounter of 05/26/14 (from the past 24 hour(s))  Basic metabolic panel     Status: Abnormal   Collection Time: 06/05/14  5:35 AM  Result Value Ref Range   Sodium 137 135 - 145 mmol/L   Potassium 4.8 3.5 - 5.1 mmol/L   Chloride 107 101 - 111 mmol/L   CO2 19 (L) 22 - 32 mmol/L   Glucose, Bld 100 (H) 65 - 99 mg/dL   BUN 14 6 - 20 mg/dL   Creatinine, Ser 1.10 (H) 0.44 - 1.00 mg/dL   Calcium 8.9 8.9 - 10.3 mg/dL   GFR calc non Af Amer 52 (L) >60 mL/min   GFR calc Af Amer 60 (L) >60 mL/min   Anion gap 11 5 - 15  CBC     Status: None   Collection Time: 06/05/14  5:35 AM  Result Value Ref Range   WBC 5.8 4.0 - 10.5 K/uL   RBC 4.62 3.87 - 5.11 MIL/uL   Hemoglobin 12.2 12.0 - 15.0 g/dL   HCT 37.8 36.0 - 46.0 %   MCV 81.8 78.0 - 100.0 fL   MCH 26.4 26.0 - 34.0 pg   MCHC 32.3 30.0 - 36.0 g/dL   RDW 14.3 11.5 - 15.5 %   Platelets 199 150 - 400 K/uL     Studies/Results: No results  found.  Marland Kitchen alvimopan  12 mg Oral BID  . amoxicillin  1,000 mg Oral Q12H  . carvedilol  25 mg Oral BID WC  . clarithromycin  500 mg Oral Q12H  . enoxaparin (LOVENOX) injection  40 mg Subcutaneous Q24H  . furosemide  40 mg Oral Daily  . lip balm  1 application Topical BID  . pantoprazole  40 mg Oral BID  . pravastatin  20 mg Oral q1800  . spironolactone  25 mg Oral Daily  . sucralfate  1 g Oral TID WC & HS  . verapamil  60 mg Oral TID     Assessment/Plan: s/p Procedure(s): LAPAROSCOPIC SMALL BOWEL RESECTION  POD #1. Laparoscopic assisted small bowel resection for compromised small bowel segment--Dr. Dalbert Batman Stable DC Foley Clear liquids Ambulate entereg  POD #32. Repair incarcerated epigastric hernia with primary tissue repair(05/04/2014)-- Dr. Lucia Gaskins  Superficial gastric ulceration, H. pylori positive. Continue amoxicillin, Biaxin, Protonix twice a day, Carafate  Hypertension. Continue verapamil, Sparano lactone, Coreg  DVT prophylaxis. SCDs and Lovenox.  @PROBHOSP @  LOS: 10 days    Terek Bee M 06/05/2014  . .prob

## 2014-06-06 NOTE — Progress Notes (Signed)
[name], female   DOB: 1950-02-13, 64 y.o.   MRN: [id number] 2 Days Post-Op  Subjective: Overall doing very well. Some pain at midline incision but controlled with oral medications. Had a bowel movement. No nausea with clear liquid diet. Had some bloody drainage on her bandage that required being changed.  Objective: Vital signs in last 24 hours: Temp:  [98 F (36.7 C)-98.5 F (36.9 C)] 98 F (36.7 C) (05/28 5681) Pulse Rate:  [72-79] 72 (05/28 0642) Resp:  [18] 18 (05/28 0642) BP: (144-151)/(76-87) 144/76 mmHg (05/28 0642) SpO2:  [97 %-100 %] 100 % (05/28 0642) Last BM Date: 06/03/14  Intake/Output from previous day: 05/27 0701 - 05/28 0700 In: 1415 [P.O.:480; I.V.:935] Out: 1250 [Urine:1250] Intake/Output this shift:    General appearance: alert, cooperative and no distress GI: normal findings: soft, non-tender Incision/Wound: 5 or 6 cm area of bloody drainage on her dressing which has been stable since last night  Lab Results:   Recent Labs  06/04/14 0520 06/05/14 0535  WBC 3.8* 5.8  HGB 12.0 12.2  HCT 37.7 37.8  PLT 242 199   BMET  Recent Labs  06/04/14 0520 06/05/14 0535  NA 137 137  K 4.2 4.8  CL 107 107  CO2 19* 19*  GLUCOSE 80 100*  BUN 8 14  CREATININE 1.07* 1.10*  CALCIUM 8.9 8.9     Studies/Results: No results found.  Anti-infectives: Anti-infectives    Start     Dose/Rate Route Frequency Ordered Stop   06/04/14 1900  cefoTEtan (CEFOTAN) 2 g in dextrose 5 % 50 mL IVPB    Comments:  Pharmacy may adjust dose strength for optimal dosing.   2 g 100 mL/hr over 30 Minutes Intravenous  Once 06/04/14 1309 06/04/14 1909   05/31/14 1045  amoxicillin (AMOXIL) capsule 1,000 mg     1,000 mg Oral Every 12 hours 05/31/14 1035     05/31/14 1045  clarithromycin (BIAXIN) tablet 500 mg     500 mg Oral Every 12 hours 05/31/14 1035     05/29/14 1230  fluconazole (DIFLUCAN) tablet 200 mg     200 mg Oral  Once 05/29/14 1222 05/29/14  1352      Assessment/Plan: s/p Procedure(s): LAPAROSCOPIC SMALL BOWEL RESECTION Overall appears to be doing very well. Advance to full liquid diet.   LOS: 11 days    Michelle Hurley T 06/06/2014

## 2014-06-06 NOTE — Progress Notes (Signed)
Pharmacy Brief Note - Alvimopan (Entereg)  The standing order set for alvimopan (Entereg) now includes an automatic order to discontinue the drug after the patient has had a bowel movement. The change was approved by the [facility] and the Medical Executive Committee.   This patient has had bowel movements documented by nursing. Therefore, alvimopan has been discontinued. If there are questions, please contact the pharmacy at [phone].   Thank you-  Garnet Sierras, PharmD 06/06/2014

## 2014-06-06 NOTE — Progress Notes (Signed)
Honeycomb and gauze dressing were about 3/4 saturated with red blood upon initial assessment at [place] 5/27.  At 2300, patient asked nurse to look at dressing, stating it was more bloody and felt wet on her skin. RN changed honeycomb dressing at this time. Sutures intact, no active bleeding noted. Will continue to monitor site.

## 2014-06-07 NOTE — Progress Notes (Signed)
[name], female   DOB: 1950/05/27, 64 y.o.   MRN: [id number] 3 Days Post-Op  Subjective: No complaints this morning. Not having any pain. Loose bowel movements. Tolerating full liquids without difficulty.  Objective: Vital signs in last 24 hours: Temp:  [97.9 F (36.6 C)-98.5 F (36.9 C)] 97.9 F (36.6 C) (05/29 0630) Pulse Rate:  [66-85] 72 (05/29 0630) Resp:  [16] 16 (05/29 0630) BP: (117-142)/(68-87) 117/87 mmHg (05/29 0630) SpO2:  [100 %] 100 % (05/29 0630) Last BM Date: 06/06/14  Intake/Output from previous day: 05/28 0701 - 05/29 0700 In: 1899 [P.O.:560; I.V.:1339] Out: 407 [Urine:404; Stool:3] Intake/Output this shift: Total I/O In: -  Out: 250 [Urine:250]  General appearance: alert, cooperative and no distress GI: normal findings: soft, non-tender Incision/Wound: no evidence of infection. Dressing changed and wicks removed  Lab Results:   Recent Labs  06/05/14 0535  WBC 5.8  HGB 12.2  HCT 37.8  PLT 199   BMET  Recent Labs  06/05/14 0535  NA 137  K 4.8  CL 107  CO2 19*  GLUCOSE 100*  BUN 14  CREATININE 1.10*  CALCIUM 8.9     Studies/Results: No results found.  Anti-infectives: Anti-infectives    Start     Dose/Rate Route Frequency Ordered Stop   06/04/14 1900  cefoTEtan (CEFOTAN) 2 g in dextrose 5 % 50 mL IVPB    Comments:  Pharmacy may adjust dose strength for optimal dosing.   2 g 100 mL/hr over 30 Minutes Intravenous  Once 06/04/14 1309 06/04/14 1909   05/31/14 1045  amoxicillin (AMOXIL) capsule 1,000 mg     1,000 mg Oral Every 12 hours 05/31/14 1035     05/31/14 1045  clarithromycin (BIAXIN) tablet 500 mg     500 mg Oral Every 12 hours 05/31/14 1035     05/29/14 1230  fluconazole (DIFLUCAN) tablet 200 mg     200 mg Oral  Once 05/29/14 1222 05/29/14 1352      Assessment/Plan: s/p Procedure(s): LAPAROSCOPIC SMALL BOWEL RESECTION Doing well without apparent complication. Advance to regular diet. Possible  discharge tomorrow.   LOS: 12 days    Talan Gildner T 06/07/2014

## 2014-06-08 MED ORDER — AMOXICILLIN 500 MG PO CAPS: 1000.0000 mg | ORAL_CAPSULE | Freq: Two times a day (BID) | ORAL | Status: DC

## 2014-06-08 MED ORDER — SUCRALFATE 1 G PO TABS: 1.0000 g | ORAL_TABLET | Freq: Three times a day (TID) | ORAL | Status: DC

## 2014-06-08 MED ORDER — PANTOPRAZOLE SODIUM 40 MG PO TBEC: 40.0000 mg | DELAYED_RELEASE_TABLET | Freq: Two times a day (BID) | ORAL | Status: DC

## 2014-06-08 MED ORDER — OXYCODONE HCL 5 MG PO TABS: 5.0000 mg | ORAL_TABLET | ORAL | Status: DC | PRN

## 2014-06-08 MED ORDER — CLARITHROMYCIN 500 MG PO TABS: 500.0000 mg | ORAL_TABLET | Freq: Two times a day (BID) | ORAL | Status: DC

## 2014-06-08 NOTE — Discharge Instructions (Signed)
ABDOMINAL SURGERY: POST OP INSTRUCTIONS  1. DIET: Follow a light bland diet the first 24 hours after arrival home, such as soup, liquids, crackers, etc.  Be sure to include lots of fluids daily.  Avoid fast food or heavy meals as your are more likely to get nauseated.  Eat a low fat the next few days after surgery.   2. Take your usually prescribed home medications unless otherwise directed. 3. PAIN CONTROL: a. Pain is best controlled by a usual combination of three different methods TOGETHER: i. Ice/Heat ii. Over the counter pain medication iii. Prescription pain medication b. Most patients will experience some swelling and bruising around the incisions.  Ice packs or heating pads (30-60 minutes up to 6 times a day) will help. Use ice for the first few days to help decrease swelling and bruising, then switch to heat to help relax tight/sore spots and speed recovery.  Some people prefer to use ice alone, heat alone, alternating between ice & heat.  Experiment to what works for you.  Swelling and bruising can take several weeks to resolve.   c. It is helpful to take an over-the-counter pain medication regularly for the first few weeks.  Choose one of the following that works best for you: i. Naproxen (Aleve, etc)  Two 220mg  tabs twice a day ii. Ibuprofen (Advil, etc) Three 200mg  tabs four times a day (every meal & bedtime) iii. Acetaminophen (Tylenol, etc) 500-650mg  four times a day (every meal & bedtime) d. A  prescription for pain medication (such as oxycodone, hydrocodone, etc) should be given to you upon discharge.  Take your pain medication as prescribed.  i. If you are having problems/concerns with the prescription medicine (does not control pain, nausea, vomiting, rash, itching, etc), please call us [phone] to see if we need to switch you to a different pain medicine that will work better for you and/or control your side effect better. ii. If you need a refill on your pain medication,  please contact your pharmacy.  They will contact our office to request authorization. Prescriptions will not be filled after 5 pm or on week-ends. 4. Avoid getting constipated.  Between the surgery and the pain medications, it is common to experience some constipation.  Increasing fluid intake and taking a fiber supplement (such as Metamucil, Citrucel, FiberCon, MiraLax, etc) 1-2 times a day regularly will usually help prevent this problem from occurring.  A mild laxative (prune juice, Milk of Magnesia, MiraLax, etc) should be taken according to package directions if there are no bowel movements after 48 hours.   5. Watch out for diarrhea.  If you have many loose bowel movements, simplify your diet to bland foods & liquids for a few days.  Stop any stool softeners and decrease your fiber supplement.  Switching to mild anti-diarrheal medications (Kayopectate, Pepto Bismol) can help.  If this worsens or does not improve, please call us. 6. Wash / shower every day.  You may shower over the incision / wound.  Avoid baths until the skin is fully healed.  Continue to shower over incision(s) after the dressing is off. 7. Change your dressing daily  You may leave the incision open to air when there is no more drainage.  You may replace a dressing/Band-Aid to cover the incision for comfort if you wish. 8. ACTIVITIES as tolerated:   a. You may resume regular (light) daily activities beginning the next day--such as daily self-care, walking, climbing stairs--gradually increasing activities as tolerated.  If  you can walk 30 minutes without difficulty, it is safe to try more intense activity such as jogging, treadmill, bicycling, low-impact aerobics, swimming, etc. b. Save the most intensive and strenuous activity for last such as sit-ups, heavy lifting, contact sports, etc  Refrain from any heavy lifting or straining until you are off narcotics for pain control.   c. DO NOT PUSH THROUGH PAIN.  Let pain be your guide: If  it hurts to do something, don't do it.  Pain is your body warning you to avoid that activity for another week until the pain goes down. d. You may drive when you are no longer taking prescription pain medication, you can comfortably wear a seatbelt, and you can safely maneuver your car and apply brakes. e. Dennis Bast may have sexual intercourse when it is comfortable.  9. FOLLOW UP in our office a. Please call [facility] at [phone] to set up an appointment to see your surgeon in the office for a follow-up appointment approximately 1-2 weeks after your surgery. b. Make sure that you call for this appointment the day you arrive home to insure a convenient appointment time. 10. IF YOU HAVE DISABILITY OR FAMILY LEAVE FORMS, BRING THEM TO THE OFFICE FOR PROCESSING.  DO NOT GIVE THEM TO YOUR DOCTOR.   WHEN TO CALL us [phone]: 1. Poor pain control 2. Reactions / problems with new medications (rash/itching, nausea, etc)  3. Fever over 101.5 F (38.5 C) 4. Inability to urinate 5. Nausea and/or vomiting 6. Worsening swelling or bruising 7. Continued bleeding from incision. 8. Increased pain, redness, or drainage from the incision  The clinic staff is available to answer your questions during regular business hours (8:30am-5pm).  Please dont hesitate to call and ask to speak to one of our nurses for clinical concerns.   A surgeon from [facility] is always on call at the hospitals   If you have a medical emergency, go to the nearest emergency room or call 911.    [facility], [place], [place], [place], [place]  [address] ? MAIN: [phone] ? TOLL FREE: [phone] ? FAX [phone] [email or web address]

## 2014-06-08 NOTE — Care Management Note (Signed)
Case Management Note  Patient Details  Name: Michelle Hurley MRN: [id number] Date of Birth: 1950-06-19  Subjective/Objective:            Admitted with Postoperative nausea and vomiting        Action/Plan: Discharge planning  Expected Discharge Date:                  Expected Discharge Plan:  Home/Self Care  In-House Referral:  NA  Discharge planning Services  CM Consult  Post Acute Care Choice:  NA Choice offered to:  NA  DME Arranged:    DME Agency:     HH Arranged:    [place] Agency:     Status of Service:  Completed, signed off  Medicare Important Message Given:  Yes Date Medicare IM Given:  06/03/14 Medicare IM give by:  Leanne Chang Date Additional Medicare IM Given:  06/08/14 Additional Medicare Important Message give by:  Sunday Spillers RN CM  If discussed at Long Length of Stay Meetings, dates discussed:    Additional Comments:  Guadalupe Maple, RN 06/08/2014, 10:12 AM

## 2014-06-08 NOTE — Discharge Summary (Signed)
Physician Discharge Summary  [name] MRN: [id number] DOB/AGE: February 18, 1950 64 y.o.  Admit date: 05/26/2014 Discharge date: 06/08/2014  Admission Diagnoses:  Discharge Diagnoses:  Active Problems:   Postoperative nausea and vomiting   Discharged Condition: good  Hospital Course: One month postop laparoscopic assisted repair of incarcerated epigastric hernia by Dr Alphonsa Overall. Had incarcerated knuckle of bowel that looked bad but he said it pinked up. She has been readmitted twice with abdominal pain and vomiting. Endoscopy shows shallow gastric ulcers and is H. pylori positive, which was treated but that is not the cause of her pain. Biliary tract workup was negative. CT scan showed abnormal short segment of small bowel with proximal dilatation.She was taken to the OR on 5/26 by Dr Dalbert Batman, and a short segment of small bowel that looked scarred and kinked was found.  The bowel was dilated proximal to this consistent with a partial obstruction.  A segmental small bowel resection was performed and the hernia was closed with a primary tissue repair. There is no mesh.  Her diet was advanced slowly.  She tolerated this well, and was ready for discharge on POD 4.    Consults: None  Significant Diagnostic Studies: labs: cbc, chemistry  Treatments: IV hydration, analgesia: acetaminophen w/ codeine and surgery: small bowel resection  Discharge Exam: Blood pressure 99/75, pulse 76, temperature 98.4 F (36.9 C), temperature source Oral, resp. rate 18, height 5\' 2"  (1.575 m), weight 78.472 kg (173 lb), last menstrual period 04/05/2012, SpO2 100 %. General appearance: alert and cooperative GI: normal findings: soft, non-tender Incision/Wound:clean, dry, intact  Disposition: 01-Home or Self Care     Medication List    TAKE these medications        acetaminophen 500 MG tablet  Commonly known as:  TYLENOL  Take 1,000 mg by mouth every 6 (six) hours as needed for mild pain  or moderate pain.     amoxicillin 500 MG capsule  Commonly known as:  AMOXIL  Take 2 capsules (1,000 mg total) by mouth every 12 (twelve) hours.     carvedilol 25 MG tablet  Commonly known as:  COREG  Take 1 tablet (25 mg total) by mouth 2 (two) times daily with a meal.     clarithromycin 500 MG tablet  Commonly known as:  BIAXIN  Take 1 tablet (500 mg total) by mouth every 12 (twelve) hours.     furosemide 40 MG tablet  Commonly known as:  LASIX  Take 1 tablet (40 mg total) by mouth daily.     loratadine 10 MG tablet  Commonly known as:  CLARITIN  Take 10 mg by mouth daily as needed for allergies (allergies).     lovastatin 20 MG tablet  Commonly known as:  MEVACOR  Take 1 tablet (20 mg total) by mouth at bedtime.     oxyCODONE 5 MG immediate release tablet  Commonly known as:  Oxy IR/ROXICODONE  Take 1 tablet (5 mg total) by mouth every 4 (four) hours as needed for moderate pain.     pantoprazole 40 MG tablet  Commonly known as:  PROTONIX  Take 1 tablet (40 mg total) by mouth 2 (two) times daily.     spironolactone 25 MG tablet  Commonly known as:  ALDACTONE  Take 1 tablet (25 mg total) by mouth daily.     sucralfate 1 G tablet  Commonly known as:  CARAFATE  Take 1 tablet (1 g total) by mouth 4 (four) times daily -  with meals and at bedtime.     verapamil 120 MG tablet  Commonly known as:  CALAN  TAKE ONE-HALF TABLET BY MOUTH THREE TIMES DAILY           Follow-up Information    Follow up with Adin Hector, MD. Schedule an appointment as soon as possible for a visit in 2 weeks.   Specialty:  General Surgery   Why:  call the office on Tues to schedule apt for staple removal on Fri   Contact information:   [address] [place] [address] [phone]       Signed: Rosario Adie 0/72/2575, 0:51 AM

## 2014-06-08 NOTE — Progress Notes (Signed)
Pt leaving at this time with her daughter. Alert, oriented, and without c/o. Discharge instructions/prescriptions given/explained with pt verbalizing understanding.  Followup appointment noted. Dressing supplies given.

## 2014-06-09 ENCOUNTER — Telehealth: Payer: Self-pay | Admitting: Internal Medicine

## 2014-06-09 NOTE — Telephone Encounter (Signed)
Patient called to speak to her PCP, she recently had surgery and wanted to know if she needs to come in for an appointment. Please f/u with pt.

## 2014-06-12 NOTE — Patient Outreach (Signed)
[facility]) Care Management  06/12/2014  Michelle Hurley February 29, 1950 [id number]   Referral from [facility] for Pharmacist, assigned Deanne Coffer, PharmD.  Ronnell Freshwater. [place], [place] Management [place] Assistant Phone: [phone] Fax: [phone]

## 2014-06-15 ENCOUNTER — Ambulatory Visit: Payer: Commercial Managed Care - HMO | Attending: Internal Medicine | Admitting: Internal Medicine

## 2014-06-15 ENCOUNTER — Encounter: Payer: Self-pay | Admitting: Internal Medicine

## 2014-06-15 VITALS — BP 96/75 | HR 79 | Temp 98.2°F | Ht 62.0 in | Wt 170.0 lb

## 2014-06-15 DIAGNOSIS — I10 Essential (primary) hypertension: Secondary | ICD-10-CM | POA: Diagnosis not present

## 2014-06-15 DIAGNOSIS — R42 Dizziness and giddiness: Secondary | ICD-10-CM | POA: Diagnosis not present

## 2014-06-15 DIAGNOSIS — Z09 Encounter for follow-up examination after completed treatment for conditions other than malignant neoplasm: Secondary | ICD-10-CM | POA: Insufficient documentation

## 2014-06-15 MED ORDER — FUROSEMIDE 20 MG PO TABS: 20.0000 mg | ORAL_TABLET | Freq: Every day | ORAL | Status: DC

## 2014-06-15 MED ORDER — PANTOPRAZOLE SODIUM 40 MG PO TBEC: 40.0000 mg | DELAYED_RELEASE_TABLET | Freq: Every day | ORAL | Status: DC

## 2014-06-15 NOTE — Progress Notes (Signed)
[name], female   DOB: 1950/03/14, 64 y.o.   MRN: [id number]   Michelle Hurley, is a 64 y.o. female  ION:[id number]  [id number]  DOB - [birth year]  Chief Complaint  Patient presents with  . Medication Problem  . Post-op Problem        Subjective:   Michelle Hurley is a 64 y.o. female with history of hypertension, dyslipidemia, paroxysmal ventricular tachycardia currently on verapamil, morbid obesity, generalized anxiety, recent surgery for umbilical hernia here today for a follow up post laparoscopic assisted repair of incarcerated epigastric hernia by Dr Alphonsa Overall. She had incarcerated knuckle of bowel that looked bad but he said it pinked up. She has been readmitted twice with abdominal pain and vomiting since the first surgery. Endoscopy showed shallow gastric ulcers and is H. pylori positive, which was treated but that is not the cause of her pain. Biliary tract workup was negative. CT scan showed abnormal short segment of small bowel with proximal dilatation.She was taken to the OR on 5/26 by Dr Dalbert Batman, and a short segment of small bowel that looked scarred and kinked was found. The bowel was dilated proximal to this consistent with a partial obstruction. A segmental small bowel resection was performed and the hernia was closed with a primary tissue repair. There is no mesh. Her diet was advanced slowly. She tolerated this well, and was discharged on POD 4. She has no new complaints today. She says since surgery her blood pressure has been running on the low side, today blood pressure is 97/75 mmHg sitting. Patient has No headache, No chest pain, No abdominal pain - No Nausea, No new weakness tingling or numbness, No Cough - SOB.  No problems updated.  ALLERGIES: Allergies  Allergen Reactions  . Mushroom Extract Complex Anaphylaxis, Itching and Swelling    Swelling all over body   . Codeine Nausea And Vomiting  . Hydrocodone Nausea And Vomiting  .  Codeine Nausea And Vomiting  . Prednisone Nausea And Vomiting    PAST MEDICAL HISTORY: Past Medical History  Diagnosis Date  . Hypertension   . Myopia   . Anxiety state, unspecified   . Morbid obesity   . Other and unspecified hyperlipidemia   . Unspecified essential hypertension   . Osteopenia   . Umbilical hernia   . Palpitations   . Arthritis   . H/O: hysterectomy   . NSVT (nonsustained ventricular tachycardia)     a. 01/2005 Echo: EF 55-65%    MEDICATIONS AT HOME: Prior to Admission medications   Medication Sig Start Date End Date Taking? Authorizing Provider  acetaminophen (TYLENOL) 500 MG tablet Take 1,000 mg by mouth every 6 (six) hours as needed for mild pain or moderate pain.     Historical Provider, MD  amoxicillin (AMOXIL) 500 MG capsule Take 2 capsules (1,000 mg total) by mouth every 12 (twelve) hours. 6/44/03   Leighton Ruff, MD  carvedilol (COREG) 25 MG tablet Take 1 tablet (25 mg total) by mouth 2 (two) times daily with a meal. 05/11/14   Tresa Garter, MD  clarithromycin (BIAXIN) 500 MG tablet Take 1 tablet (500 mg total) by mouth every 12 (twelve) hours. 4/74/25   Leighton Ruff, MD  furosemide (LASIX) 20 MG tablet Take 1 tablet (20 mg total) by mouth daily. 06/15/14   Tresa Garter, MD  loratadine (CLARITIN) 10 MG tablet Take 10 mg by mouth daily as needed for allergies (allergies).    Historical Provider, MD  lovastatin (  MEVACOR) 20 MG tablet Take 1 tablet (20 mg total) by mouth at bedtime. Patient not taking: Reported on 06/15/2014 05/11/14   Tresa Garter, MD  oxyCODONE (OXY IR/ROXICODONE) 5 MG immediate release tablet Take 1 tablet (5 mg total) by mouth every 4 (four) hours as needed for moderate pain. 3/84/53   Leighton Ruff, MD  pantoprazole (PROTONIX) 40 MG tablet Take 1 tablet (40 mg total) by mouth daily. 06/15/14   Tresa Garter, MD  spironolactone (ALDACTONE) 25 MG tablet Take 1 tablet (25 mg total) by mouth daily. 05/11/14   Tresa Garter, MD  sucralfate (CARAFATE) 1 G tablet Take 1 tablet (1 g total) by mouth 4 (four) times daily -  with meals and at bedtime. 6/46/80   Leighton Ruff, MD  verapamil (CALAN) 120 MG tablet TAKE ONE-HALF TABLET BY MOUTH THREE TIMES DAILY Patient taking differently: Take 60 mg by mouth 3 (three) times daily.  05/11/14   Tresa Garter, MD     Objective:   Filed Vitals:   06/15/14 1507  BP: 96/75  Pulse: 79  Temp: 98.2 F (36.8 C)  TempSrc: Oral  Height: 5\' 2"  (1.575 m)  Weight: 170 lb (77.111 kg)    Exam General appearance : Awake, alert, not in any distress. Speech Clear. Not toxic looking HEENT: Atraumatic and Normocephalic, pupils equally reactive to light and accomodation Neck: supple, no JVD. No cervical lymphadenopathy.  Chest:Good air entry bilaterally, no added sounds  CVS: S1 S2 regular, no murmurs.  Abdomen: Bowel sounds present, Non tender and not distended with no gaurding, rigidity or rebound. Extremities: B/L Lower Ext shows no edema, both legs are warm to touch Neurology: Awake alert, and oriented X 3, CN II-XII intact, Non focal Skin:No Rash  Data Review Lab Results  Component Value Date   HGBA1C 5.9 04/24/2013   HGBA1C 5.7 08/15/2012   HGBA1C 6.4* 04/06/2012     Assessment & Plan   1. Essential hypertension: On the low side. Reduce furosemide to 20 mg from 40 mg PO daily  Reduce - furosemide (LASIX) 20 MG tablet; Take 1 tablet (20 mg total) by mouth daily.  Dispense: 90 tablet; Refill: 3  We have discussed target BP range and blood pressure goal. I have advised patient to check BP regularly and to call us back or report to clinic if the numbers are consistently higher than 140/90. We discussed the importance of compliance with medical therapy and DASH diet recommended, consequences of uncontrolled hypertension discussed.   - continue current BP medications  2. Dizziness  Reduce - furosemide (LASIX) 20 MG tablet; Take 1 tablet (20 mg total) by  mouth daily.  Dispense: 90 tablet; Refill: 3  3. Follow-up examination following surgery Patient doing well, follow up with surgery as scheduled.  Patient have been counseled extensively about nutrition and exercise Return in about 4 weeks (around 07/13/2014), or if symptoms worsen or fail to improve, for Routine Follow Up.  The patient was given clear instructions to go to ER or return to medical center if symptoms don't improve, worsen or new problems develop. The patient verbalized understanding. The patient was told to call to get lab results if they haven't heard anything in the next week.   This note has been created with Surveyor, quantity. Any transcriptional errors are unintentional.    Angelica Chessman, MD, [place], [place], [place], [place] and [facility], [place]   06/15/2014,  3:38 PM

## 2014-06-15 NOTE — Progress Notes (Signed)
Pt is s/p hernia surgery. Her BP has been running low since her surgery. Michelle Hurley

## 2014-06-15 NOTE — Patient Instructions (Signed)
Gastroesophageal Reflux Disease, Adult Gastroesophageal reflux disease (GERD) happens when acid from your stomach flows up into the esophagus. When acid comes in contact with the esophagus, the acid causes soreness (inflammation) in the esophagus. Over time, GERD may create small holes (ulcers) in the lining of the esophagus. CAUSES   Increased body weight. This puts pressure on the stomach, making acid rise from the stomach into the esophagus.  Smoking. This increases acid production in the stomach.  Drinking alcohol. This causes decreased pressure in the lower esophageal sphincter (valve or ring of muscle between the esophagus and stomach), allowing acid from the stomach into the esophagus.  Late evening meals and a full stomach. This increases pressure and acid production in the stomach.  A malformed lower esophageal sphincter. Sometimes, no cause is found. SYMPTOMS   Burning pain in the lower part of the mid-chest behind the breastbone and in the mid-stomach area. This may occur twice a week or more often.  Trouble swallowing.  Sore throat.  Dry cough.  Asthma-like symptoms including chest tightness, shortness of breath, or wheezing. DIAGNOSIS  Your caregiver may be able to diagnose GERD based on your symptoms. In some cases, X-rays and other tests may be done to check for complications or to check the condition of your stomach and esophagus. TREATMENT  Your caregiver may recommend over-the-counter or prescription medicines to help decrease acid production. Ask your caregiver before starting or adding any new medicines.  HOME CARE INSTRUCTIONS   Change the factors that you can control. Ask your caregiver for guidance concerning weight loss, quitting smoking, and alcohol consumption.  Avoid foods and drinks that make your symptoms worse, such as:  Caffeine or alcoholic drinks.  Chocolate.  Peppermint or mint flavorings.  Garlic and onions.  Spicy foods.  Citrus fruits,  such as oranges, lemons, or limes.  Tomato-based foods such as sauce, chili, salsa, and pizza.  Fried and fatty foods.  Avoid lying down for the 3 hours prior to your bedtime or prior to taking a nap.  Eat small, frequent meals instead of large meals.  Wear loose-fitting clothing. Do not wear anything tight around your waist that causes pressure on your stomach.  Raise the head of your bed 6 to 8 inches with wood blocks to help you sleep. Extra pillows will not help.  Only take over-the-counter or prescription medicines for pain, discomfort, or fever as directed by your caregiver.  Do not take aspirin, ibuprofen, or other nonsteroidal anti-inflammatory drugs (NSAIDs). SEEK IMMEDIATE MEDICAL CARE IF:   You have pain in your arms, neck, jaw, teeth, or back.  Your pain increases or changes in intensity or duration.  You develop nausea, vomiting, or sweating (diaphoresis).  You develop shortness of breath, or you faint.  Your vomit is green, yellow, black, or looks like coffee grounds or blood.  Your stool is red, bloody, or black. These symptoms could be signs of other problems, such as heart disease, gastric bleeding, or esophageal bleeding. MAKE SURE YOU:   Understand these instructions.  Will watch your condition.  Will get help right away if you are not doing well or get worse. Document Released: 10/05/2004 Document Revised: 03/20/2011 Document Reviewed: 07/15/2010 ExitCare Patient Information 2015 [employer]. This information is not intended to replace advice given to you by your health care provider. Make sure you discuss any questions you have with your health care provider.  

## 2014-06-18 ENCOUNTER — Telehealth: Payer: Self-pay | Admitting: Internal Medicine

## 2014-06-18 NOTE — Telephone Encounter (Signed)
Patient called to request a med change, she was prescribed pantoprazole (PROTONIX) 40 MG tablet  But it is too expensive and she would like it changed. Please f/u

## 2014-06-19 ENCOUNTER — Encounter: Payer: Self-pay | Admitting: Pharmacist

## 2014-06-19 NOTE — Patient Outreach (Signed)
Michelle Hurley) Care Management  [place]   06/19/2014  Michelle Hurley 08-15-1950 [id number]  Subjective: Michelle Hurley is a 64 y.o. female who was referred to [facility] for a medication review consult.   Patient is concerned about drug interaction between statin and another one of her medications, as well as her medications causing nausea/vomiting.   Objective:   Current Medications: Current Outpatient Prescriptions  Medication Sig Dispense Refill  . acetaminophen (TYLENOL) 500 MG tablet Take 1,000 mg by mouth every 6 (six) hours as needed for mild pain or moderate pain.     Marland Kitchen amoxicillin (AMOXIL) 500 MG capsule Take 2 capsules (1,000 mg total) by mouth every 12 (twelve) hours. 14 capsule 0  . carvedilol (COREG) 25 MG tablet Take 1 tablet (25 mg total) by mouth 2 (two) times daily with a meal. 180 tablet 3  . clarithromycin (BIAXIN) 500 MG tablet Take 1 tablet (500 mg total) by mouth every 12 (twelve) hours. 14 tablet 0  . furosemide (LASIX) 20 MG tablet Take 1 tablet (20 mg total) by mouth daily. 90 tablet 3  . loratadine (CLARITIN) 10 MG tablet Take 10 mg by mouth daily as needed for allergies (allergies).    Marland Kitchen lovastatin (MEVACOR) 20 MG tablet Take 1 tablet (20 mg total) by mouth at bedtime. (Patient not taking: Reported on 06/15/2014) 90 tablet 3  . oxyCODONE (OXY IR/ROXICODONE) 5 MG immediate release tablet Take 1 tablet (5 mg total) by mouth every 4 (four) hours as needed for moderate pain. 30 tablet 0  . pantoprazole (PROTONIX) 40 MG tablet Take 1 tablet (40 mg total) by mouth daily. 90 tablet 3  . spironolactone (ALDACTONE) 25 MG tablet Take 1 tablet (25 mg total) by mouth daily. 90 tablet 3  . sucralfate (CARAFATE) 1 G tablet Take 1 tablet (1 g total) by mouth 4 (four) times daily -  with meals and at bedtime. 90 tablet 0  . verapamil (CALAN) 120 MG tablet TAKE ONE-HALF TABLET BY MOUTH THREE TIMES DAILY (Patient taking differently: Take 60 mg by  mouth 3 (three) times daily. ) 90 tablet 3   No current facility-administered medications for this visit.    Functional Status: In your present state of health, do you have any difficulty performing the following activities: 05/26/2014 05/22/2014  Hearing? N N  Vision? N N  Difficulty concentrating or making decisions? N N  Walking or climbing stairs? N N  Dressing or bathing? N N  Doing errands, shopping? N N    Fall/Depression Screening: PHQ 2/9 Scores 06/15/2014  PHQ - 2 Score 0    Assessment:  Drugs sorted by system:  Neurologic/Psychologic: none noted  Cardiovascular: carvedilol, furosemide, lovastatin, spironolactone, verapamil  Pulmonary/Allergy: loratadine  Gastrointestinal: pantoprazole, sucralfate  Endocrine: none noted  Renal: none noted  Topical: none noted  Pain: acetaminophen, oxycodone  Miscellaneous: amoxicillin, clarithromycin   Duplications in therapy: none noted Gaps in therapy: none - on a statin. Medications to avoid in the elderly: none noted Drug interactions:  Lovastatin and verapamil. Dose of lovastatin should not exceed 20 mg a day while patient is on verapamil.  Clarithromycin and lovastatin use together is contraindicated due to risk of lovastatin toxicity.   Clarithromycin may decrease the metabolism of verapamil.   Of note, patient was given a week supply of clarithromycin so she should be off clarithromycin by now.  Other issues noted: the antibiotics can cause nausea/vomiting.    Plan: 1. Medication review  consult: tried to reach out to patient to make sure that she was off of the clarithromycin. Unable to reach the patient. Left a HIPAA compliant voicemail and will try to reach the patient again 06/22/14 if I do not hear from her by then.  Nicoletta Ba, PharmD, [place] Resident [place] [phone]

## 2014-06-22 ENCOUNTER — Other Ambulatory Visit: Payer: Self-pay | Admitting: Pharmacist

## 2014-06-22 NOTE — Patient Outreach (Signed)
[facility]) Care Management  [place]   06/22/2014  Michelle Hurley 04/30/1950 [id number]  Subjective: Michelle Hurley is a 64 y.o. female who was referred to [facility] for a medication review consult.   Patient is concerned about drug interaction between statin and another one of her medications, as well as her medications causing nausea/vomiting.   I spoke to the patient today and she is already off of the clarithromycin, which would cause some drug interactions.  She completed her antibiotic course and has restarted taking her statin. She denies any issues with her medications or the need for care management.   Objective:   Current Medications: Current Outpatient Prescriptions  Medication Sig Dispense Refill  . acetaminophen (TYLENOL) 500 MG tablet Take 1,000 mg by mouth every 6 (six) hours as needed for mild pain or moderate pain.     Marland Kitchen amoxicillin (AMOXIL) 500 MG capsule Take 2 capsules (1,000 mg total) by mouth every 12 (twelve) hours. 14 capsule 0  . carvedilol (COREG) 25 MG tablet Take 1 tablet (25 mg total) by mouth 2 (two) times daily with a meal. 180 tablet 3  . clarithromycin (BIAXIN) 500 MG tablet Take 1 tablet (500 mg total) by mouth every 12 (twelve) hours. 14 tablet 0  . furosemide (LASIX) 20 MG tablet Take 1 tablet (20 mg total) by mouth daily. 90 tablet 3  . loratadine (CLARITIN) 10 MG tablet Take 10 mg by mouth daily as needed for allergies (allergies).    Marland Kitchen lovastatin (MEVACOR) 20 MG tablet Take 1 tablet (20 mg total) by mouth at bedtime. (Patient not taking: Reported on 06/15/2014) 90 tablet 3  . oxyCODONE (OXY IR/ROXICODONE) 5 MG immediate release tablet Take 1 tablet (5 mg total) by mouth every 4 (four) hours as needed for moderate pain. 30 tablet 0  . pantoprazole (PROTONIX) 40 MG tablet Take 1 tablet (40 mg total) by mouth daily. 90 tablet 3  . spironolactone (ALDACTONE) 25 MG tablet Take 1 tablet (25 mg total) by mouth daily.  90 tablet 3  . sucralfate (CARAFATE) 1 G tablet Take 1 tablet (1 g total) by mouth 4 (four) times daily -  with meals and at bedtime. 90 tablet 0  . verapamil (CALAN) 120 MG tablet TAKE ONE-HALF TABLET BY MOUTH THREE TIMES DAILY (Patient taking differently: Take 60 mg by mouth 3 (three) times daily. ) 90 tablet 3   No current facility-administered medications for this visit.    Functional Status: In your present state of health, do you have any difficulty performing the following activities: 05/26/2014 05/22/2014  Hearing? N N  Vision? N N  Difficulty concentrating or making decisions? N N  Walking or climbing stairs? N N  Dressing or bathing? N N  Doing errands, shopping? N N    Fall/Depression Screening: PHQ 2/9 Scores 06/15/2014  PHQ - 2 Score 0    Assessment: 1. Drug interactions: patient is off of the offending agent (clarithromycin) and did not take the lovastatin during the time that she was taking her antibiotics. She denies any other issues with her medications and denies the need for care management services.    Plan: 1. Medication review consult: no issues noted, will not open the case to case management.   Nicoletta Ba, PharmD, [place] Resident [place] [phone]

## 2014-06-23 NOTE — Patient Outreach (Signed)
[facility]) Care Management  06/23/2014  KESLEY MULLENS Mar 03, 1950 [id number]   Notification form Nicoletta Ba, PharmD to close case due to patient refused services from [place] Management.  Ronnell [place]. [place], [place] Management [place] Assistant Phone: [phone] Fax: [phone]

## 2014-08-10 ENCOUNTER — Encounter: Payer: Self-pay | Admitting: Internal Medicine

## 2014-08-10 ENCOUNTER — Ambulatory Visit: Payer: Commercial Managed Care - HMO | Attending: Internal Medicine | Admitting: Internal Medicine

## 2014-08-10 VITALS — BP 124/78 | HR 67 | Temp 98.0°F | Resp 18 | Wt 177.0 lb

## 2014-08-10 DIAGNOSIS — M8580 Other specified disorders of bone density and structure, unspecified site: Secondary | ICD-10-CM | POA: Diagnosis not present

## 2014-08-10 DIAGNOSIS — Z79899 Other long term (current) drug therapy: Secondary | ICD-10-CM | POA: Insufficient documentation

## 2014-08-10 DIAGNOSIS — Z0000 Encounter for general adult medical examination without abnormal findings: Secondary | ICD-10-CM | POA: Diagnosis not present

## 2014-08-10 DIAGNOSIS — I10 Essential (primary) hypertension: Secondary | ICD-10-CM | POA: Diagnosis not present

## 2014-08-10 DIAGNOSIS — E785 Hyperlipidemia, unspecified: Secondary | ICD-10-CM | POA: Diagnosis not present

## 2014-08-10 DIAGNOSIS — Z09 Encounter for follow-up examination after completed treatment for conditions other than malignant neoplasm: Secondary | ICD-10-CM | POA: Diagnosis not present

## 2014-08-10 NOTE — Progress Notes (Signed)
Here for a f/u post hernia repair Reports she's feeling better Alert, no signs of acute distress.

## 2014-08-10 NOTE — Progress Notes (Signed)
[name], female   DOB: Jul 15, 1950, 64 y.o.   MRN: [id number]   Michelle Hurley, is a 64 y.o. female  [id number]  [id number]  DOB - [birth year]  Chief Complaint  Patient presents with  . Follow-up        Subjective:   Michelle Hurley is a 64 y.o. female here today for a follow up visit. Patient with medical history of hypertension, obesity, umbilical hernia status post surgery here today for routine follow-up. She would like to know when to start her normal daily routines following surgery especially her routine exercise like walking. She has no new complaints today. Her blood pressure has been stable and well controlled, no leg swelling, no abdominal pain. Patient has No headache, No chest pain, No Nausea, No new weakness tingling or numbness, No Cough - SOB.  Problem  Follow-Up Examination Following Surgery  Physical Exam, Annual    ALLERGIES: Allergies  Allergen Reactions  . Mushroom Extract Complex Anaphylaxis, Itching and Swelling    Swelling all over body   . Codeine Nausea And Vomiting  . Hydrocodone Nausea And Vomiting  . Codeine Nausea And Vomiting  . Prednisone Nausea And Vomiting    PAST MEDICAL HISTORY: Past Medical History  Diagnosis Date  . Hypertension   . Myopia   . Anxiety state, unspecified   . Morbid obesity   . Other and unspecified hyperlipidemia   . Unspecified essential hypertension   . Osteopenia   . Umbilical hernia   . Palpitations   . Arthritis   . H/O: hysterectomy   . NSVT (nonsustained ventricular tachycardia)     a. 01/2005 Echo: EF 55-65%    MEDICATIONS AT HOME: Prior to Admission medications   Medication Sig Start Date End Date Taking? Authorizing Provider  carvedilol (COREG) 25 MG tablet Take 1 tablet (25 mg total) by mouth 2 (two) times daily with a meal. 05/11/14  Yes Glee Lashomb E Hyman Hopes, MD  clarithromycin (BIAXIN) 500 MG tablet Take 1 tablet (500 mg total) by mouth every 12 (twelve) hours. 06/08/14   Yes Romie Levee, MD  furosemide (LASIX) 20 MG tablet Take 1 tablet (20 mg total) by mouth daily. 06/15/14  Yes Quentin Angst, MD  loratadine (CLARITIN) 10 MG tablet Take 10 mg by mouth daily as needed for allergies (allergies).   Yes Historical Provider, MD  lovastatin (MEVACOR) 20 MG tablet Take 1 tablet (20 mg total) by mouth at bedtime. 05/11/14  Yes Quentin Angst, MD  pantoprazole (PROTONIX) 40 MG tablet Take 1 tablet (40 mg total) by mouth daily. 06/15/14  Yes Quentin Angst, MD  spironolactone (ALDACTONE) 25 MG tablet Take 1 tablet (25 mg total) by mouth daily. 05/11/14  Yes Josefina Rynders Annitta Needs, MD  sucralfate (CARAFATE) 1 G tablet Take 1 tablet (1 g total) by mouth 4 (four) times daily -  with meals and at bedtime. 06/08/14  Yes Romie Levee, MD  verapamil (CALAN) 120 MG tablet TAKE ONE-HALF TABLET BY MOUTH THREE TIMES DAILY Patient taking differently: Take 60 mg by mouth 3 (three) times daily.  05/11/14  Yes Quentin Angst, MD  acetaminophen (TYLENOL) 500 MG tablet Take 1,000 mg by mouth every 6 (six) hours as needed for mild pain or moderate pain.     Historical Provider, MD  amoxicillin (AMOXIL) 500 MG capsule Take 2 capsules (1,000 mg total) by mouth every 12 (twelve) hours. Patient not taking: Reported on 08/10/2014 06/08/14   Romie Levee, MD  oxyCODONE (OXY IR/ROXICODONE)  5 MG immediate release tablet Take 1 tablet (5 mg total) by mouth every 4 (four) hours as needed for moderate pain. Patient not taking: Reported on 08/10/2014 06/08/14   Romie Levee, MD     Objective:   Filed Vitals:   08/10/14 0911  BP: 124/78  Pulse: 67  Temp: 98 F (36.7 C)  TempSrc: Oral  Resp: 18  Weight: 177 lb (80.287 kg)  SpO2: 98%    Exam General appearance : Awake, alert, not in any distress. Speech Clear. Not toxic looking HEENT: Atraumatic and Normocephalic, pupils equally reactive to light and accomodation Neck: supple, no JVD. No cervical lymphadenopathy.  Chest:Good air  entry bilaterally, no added sounds  CVS: S1 S2 regular, no murmurs.  Abdomen: Surgical scar well-healed, Bowel sounds present, Non tender and not distended with no gaurding, rigidity or rebound. Extremities: B/L Lower Ext shows no edema, both legs are warm to touch Neurology: Awake alert, and oriented X 3, CN II-XII intact, Non focal Skin:No Rash  Data Review Lab Results  Component Value Date   HGBA1C 5.9 04/24/2013   HGBA1C 5.7 08/15/2012   HGBA1C 6.4* 04/06/2012     Assessment & Plan   1. Follow-up examination following surgery Patient has done very well post surgery Midline supraumbilical surgical scar well-healed No abdominal tenderness to palpation Patient can resume normal daily routines including gentle exercise, no weight lifting emphasized  2. Essential hypertension - We have discussed target BP range and blood pressure goal - I have advised patient to check BP regularly and to call us back or report to clinic if the numbers are consistently higher than 140/90  - We discussed the importance of compliance with medical therapy and DASH diet recommended, consequences of uncontrolled hypertension discussed.  - continue current BP medications  3. Physical exam, annual  - MM Digital Screening; Future - Ambulatory referral to Gastroenterology  Patient have been counseled extensively about nutrition and exercise Return in about 3 months (around 11/10/2014) for Heart Failure and Hypertension, Abdominal Pain.  The patient was given clear instructions to go to ER or return to medical center if symptoms don't improve, worsen or new problems develop. The patient verbalized understanding. The patient was told to call to get lab results if they haven't heard anything in the next week.   This note has been created with Education officer, environmental. Any transcriptional errors are unintentional.    Jeanann Lewandowsky, MD, MHA, CPE, FACP, FAAP [facility] [place], [place] [phone]   08/10/2014, 9:46 AM

## 2014-08-10 NOTE — Patient Instructions (Signed)
DASH Eating Plan DASH stands for "Dietary Approaches to Stop Hypertension." The DASH eating plan is a healthy eating plan that has been shown to reduce high blood pressure (hypertension). Additional health benefits may include reducing the risk of type 2 diabetes mellitus, heart disease, and stroke. The DASH eating plan may also help with weight loss. WHAT DO I NEED TO KNOW ABOUT THE DASH EATING PLAN? For the DASH eating plan, you will follow these general guidelines:  Choose foods with a percent daily value for sodium of less than 5% (as listed on the food label).  Use salt-free seasonings or herbs instead of table salt or sea salt.  Check with your health care provider or pharmacist before using salt substitutes.  Eat lower-sodium products, often labeled as "lower sodium" or "no salt added."  Eat fresh foods.  Eat more vegetables, fruits, and low-fat dairy products.  Choose whole grains. Look for the word "whole" as the first word in the ingredient list.  Choose fish and skinless chicken or turkey more often than red meat. Limit fish, poultry, and meat to 6 oz (170 g) each day.  Limit sweets, desserts, sugars, and sugary drinks.  Choose heart-healthy fats.  Limit cheese to 1 oz (28 g) per day.  Eat more home-cooked food and less restaurant, buffet, and fast food.  Limit fried foods.  Cook foods using methods other than frying.  Limit canned vegetables. If you do use them, rinse them well to decrease the sodium.  When eating at a restaurant, ask that your food be prepared with less salt, or no salt if possible. WHAT FOODS CAN I EAT? Seek help from a dietitian for individual calorie needs. Grains Whole grain or whole wheat bread. Brown rice. Whole grain or whole wheat pasta. Quinoa, bulgur, and whole grain cereals. Low-sodium cereals. Corn or whole wheat flour tortillas. Whole grain cornbread. Whole grain crackers. Low-sodium crackers. Vegetables Fresh or frozen vegetables  (raw, steamed, roasted, or grilled). Low-sodium or reduced-sodium tomato and vegetable juices. Low-sodium or reduced-sodium tomato sauce and paste. Low-sodium or reduced-sodium canned vegetables.  Fruits All fresh, canned (in natural juice), or frozen fruits. Meat and Other Protein Products Ground beef (85% or leaner), grass-fed beef, or beef trimmed of fat. Skinless chicken or turkey. Ground chicken or turkey. Pork trimmed of fat. All fish and seafood. Eggs. Dried beans, peas, or lentils. Unsalted nuts and seeds. Unsalted canned beans. Dairy Low-fat dairy products, such as skim or 1% milk, 2% or reduced-fat cheeses, low-fat ricotta or cottage cheese, or plain low-fat yogurt. Low-sodium or reduced-sodium cheeses. Fats and Oils Tub margarines without trans fats. Light or reduced-fat mayonnaise and salad dressings (reduced sodium). Avocado. Safflower, olive, or canola oils. Natural peanut or almond butter. Other Unsalted popcorn and pretzels. The items listed above may not be a complete list of recommended foods or beverages. Contact your dietitian for more options. WHAT FOODS ARE NOT RECOMMENDED? Grains White bread. White pasta. White rice. Refined cornbread. Bagels and croissants. Crackers that contain trans fat. Vegetables Creamed or fried vegetables. Vegetables in a cheese sauce. Regular canned vegetables. Regular canned tomato sauce and paste. Regular tomato and vegetable juices. Fruits Dried fruits. Canned fruit in light or heavy syrup. Fruit juice. Meat and Other Protein Products Fatty cuts of meat. Ribs, chicken wings, bacon, sausage, bologna, salami, chitterlings, fatback, hot dogs, bratwurst, and packaged luncheon meats. Salted nuts and seeds. Canned beans with salt. Dairy Whole or 2% milk, cream, half-and-half, and cream cheese. Whole-fat or sweetened yogurt. Full-fat   cheeses or blue cheese. Nondairy creamers and whipped toppings. Processed cheese, cheese spreads, or cheese  curds. Condiments Onion and garlic salt, seasoned salt, table salt, and sea salt. Canned and packaged gravies. Worcestershire sauce. Tartar sauce. Barbecue sauce. Teriyaki sauce. Soy sauce, including reduced sodium. Steak sauce. Fish sauce. Oyster sauce. Cocktail sauce. Horseradish. Ketchup and mustard. Meat flavorings and tenderizers. Bouillon cubes. Hot sauce. Tabasco sauce. Marinades. Taco seasonings. Relishes. Fats and Oils Butter, stick margarine, lard, shortening, ghee, and bacon fat. Coconut, palm kernel, or palm oils. Regular salad dressings. Other Pickles and olives. Salted popcorn and pretzels. The items listed above may not be a complete list of foods and beverages to avoid. Contact your dietitian for more information. WHERE CAN I FIND MORE INFORMATION? National Heart, Lung, and [employer] Institute: www.nhlbi.nih.gov/health/health-topics/topics/dash/ Document Released: 12/15/2010 Document Revised: 05/12/2013 Document Reviewed: 10/30/2012 ExitCare Patient Information 2015 [employer]. This information is not intended to replace advice given to you by your health care provider. Make sure you discuss any questions you have with your health care provider. Hypertension Hypertension, commonly called high blood pressure, is when the force of blood pumping through your arteries is too strong. Your arteries are the blood vessels that carry blood from your heart throughout your body. A blood pressure reading consists of a higher number over a lower number, such as 110/72. The higher number (systolic) is the pressure inside your arteries when your heart pumps. The lower number (diastolic) is the pressure inside your arteries when your heart relaxes. Ideally you want your blood pressure below 120/80. Hypertension forces your heart to work harder to pump blood. Your arteries may become narrow or stiff. Having hypertension puts you at risk for heart disease, stroke, and other problems.  RISK  FACTORS Some risk factors for high blood pressure are controllable. Others are not.  Risk factors you cannot control include:   Race. You may be at higher risk if you are African American.  Age. Risk increases with age.  Gender. Men are at higher risk than women before age 45 years. After age 65, women are at higher risk than men. Risk factors you can control include:  Not getting enough exercise or physical activity.  Being overweight.  Getting too much fat, sugar, calories, or salt in your diet.  Drinking too much alcohol. SIGNS AND SYMPTOMS Hypertension does not usually cause signs or symptoms. Extremely high blood pressure (hypertensive crisis) may cause headache, anxiety, shortness of breath, and nosebleed. DIAGNOSIS  To check if you have hypertension, your health care provider will measure your blood pressure while you are seated, with your arm held at the level of your heart. It should be measured at least twice using the same arm. Certain conditions can cause a difference in blood pressure between your right and left arms. A blood pressure reading that is higher than normal on one occasion does not mean that you need treatment. If one blood pressure reading is high, ask your health care provider about having it checked again. TREATMENT  Treating high blood pressure includes making lifestyle changes and possibly taking medicine. Living a healthy lifestyle can help lower high blood pressure. You may need to change some of your habits. Lifestyle changes may include:  Following the DASH diet. This diet is high in fruits, vegetables, and whole grains. It is low in salt, red meat, and added sugars.  Getting at least 2 hours of brisk physical activity every week.  Losing weight if necessary.  Not smoking.  Limiting   alcoholic beverages.  Learning ways to reduce stress. If lifestyle changes are not enough to get your blood pressure under control, your health care provider may  prescribe medicine. You may need to take more than one. Work closely with your health care provider to understand the risks and benefits. HOME CARE INSTRUCTIONS  Have your blood pressure rechecked as directed by your health care provider.   Take medicines only as directed by your health care provider. Follow the directions carefully. Blood pressure medicines must be taken as prescribed. The medicine does not work as well when you skip doses. Skipping doses also puts you at risk for problems.   Do not smoke.   Monitor your blood pressure at home as directed by your health care provider. SEEK MEDICAL CARE IF:   You think you are having a reaction to medicines taken.  You have recurrent headaches or feel dizzy.  You have swelling in your ankles.  You have trouble with your vision. SEEK IMMEDIATE MEDICAL CARE IF:  You develop a severe headache or confusion.  You have unusual weakness, numbness, or feel faint.  You have severe chest or abdominal pain.  You vomit repeatedly.  You have trouble breathing. MAKE SURE YOU:   Understand these instructions.  Will watch your condition.  Will get help right away if you are not doing well or get worse. Document Released: 12/26/2004 Document Revised: 05/12/2013 Document Reviewed: 10/18/2012 ExitCare Patient Information 2015 [employer]. This information is not intended to replace advice given to you by your health care provider. Make sure you discuss any questions you have with your health care provider.  

## 2014-08-19 ENCOUNTER — Ambulatory Visit (HOSPITAL_COMMUNITY)
Admission: RE | Admit: 2014-08-19 | Discharge: 2014-08-19 | Disposition: A | Discharge status: Home / Self Care | Payer: Commercial Managed Care - HMO | Source: Ambulatory Visit | Attending: Internal Medicine | Admitting: Internal Medicine

## 2014-08-19 DIAGNOSIS — Z0000 Encounter for general adult medical examination without abnormal findings: Secondary | ICD-10-CM

## 2014-08-19 DIAGNOSIS — Z1231 Encounter for screening mammogram for malignant neoplasm of breast: Secondary | ICD-10-CM | POA: Diagnosis not present

## 2014-09-11 ENCOUNTER — Telehealth: Payer: Self-pay

## 2014-09-11 NOTE — Telephone Encounter (Signed)
Patient no show to her appointment 09/02/14 At [facility]

## 2014-09-11 NOTE — Telephone Encounter (Signed)
Nurse called patient, patient verified date of birth. Patient aware of mammogram showing no evidence of malignancy.  Patient agrees to have screening mammogram in one year.  Patient explains having a call from someone to set up an appointment but patient does not know who it was or where they were from. Nurse will send message to Alinda Sierras with referrals to see if it could have been a call for the gastroenterology referral.

## 2014-09-11 NOTE — Telephone Encounter (Signed)
-----   Message from Nila Nephew, RN sent at 09/02/2014  9:33 AM EDT -----   ----- Message -----    From: Tresa Garter, MD    Sent: 08/28/2014   6:04 PM      To: Esperanza Sheets, RN  Please inform patient that her mammogram shows no evidence of malignancy. Recommend screening mammogram in one year.

## 2014-11-03 ENCOUNTER — Emergency Department (HOSPITAL_COMMUNITY)
Admission: EM | Admit: 2014-11-03 | Discharge: 2014-11-03 | Disposition: A | Discharge status: Home / Self Care | Payer: Commercial Managed Care - HMO | Attending: Emergency Medicine | Admitting: Emergency Medicine

## 2014-11-03 ENCOUNTER — Encounter (HOSPITAL_COMMUNITY): Payer: Self-pay | Admitting: Emergency Medicine

## 2014-11-03 ENCOUNTER — Telehealth: Payer: Self-pay | Admitting: Internal Medicine

## 2014-11-03 DIAGNOSIS — Z79899 Other long term (current) drug therapy: Secondary | ICD-10-CM | POA: Insufficient documentation

## 2014-11-03 DIAGNOSIS — F411 Generalized anxiety disorder: Secondary | ICD-10-CM | POA: Insufficient documentation

## 2014-11-03 DIAGNOSIS — I10 Essential (primary) hypertension: Secondary | ICD-10-CM | POA: Insufficient documentation

## 2014-11-03 DIAGNOSIS — R21 Rash and other nonspecific skin eruption: Secondary | ICD-10-CM

## 2014-11-03 DIAGNOSIS — Z8719 Personal history of other diseases of the digestive system: Secondary | ICD-10-CM | POA: Insufficient documentation

## 2014-11-03 DIAGNOSIS — Z8669 Personal history of other diseases of the nervous system and sense organs: Secondary | ICD-10-CM | POA: Diagnosis not present

## 2014-11-03 DIAGNOSIS — E785 Hyperlipidemia, unspecified: Secondary | ICD-10-CM | POA: Insufficient documentation

## 2014-11-03 DIAGNOSIS — M1990 Unspecified osteoarthritis, unspecified site: Secondary | ICD-10-CM | POA: Insufficient documentation

## 2014-11-03 DIAGNOSIS — Z90710 Acquired absence of both cervix and uterus: Secondary | ICD-10-CM | POA: Diagnosis not present

## 2014-11-03 DIAGNOSIS — S40869A Insect bite (nonvenomous) of unspecified upper arm, initial encounter: Secondary | ICD-10-CM | POA: Diagnosis not present

## 2014-11-03 DIAGNOSIS — S80869A Insect bite (nonvenomous), unspecified lower leg, initial encounter: Secondary | ICD-10-CM | POA: Diagnosis not present

## 2014-11-03 NOTE — ED Provider Notes (Signed)
CSN: [id number]     Arrival date & time 11/03/14  1050 History  By signing my name below, I, Michelle Hurley, attest that this documentation has been prepared under the direction and in the presence of [name], PA-C Electronically Signed: Soijett Hurley, ED Scribe. 11/03/2014. 1:33 PM.   Chief Complaint  Patient presents with  . Urticaria      The history is provided by the patient. No language interpreter was used.    Michelle Hurley is a 64 y.o. female with a medical hx of HTN who presents to the Emergency Department complaining of constant moderately itchy hives onset 1 month. She reports that she will only itch when there is an area of redness that appears and that the hives are not worsened during any particular time of the day. She notes that she has had the rash since she was d/c from the hospital following her hernia and dissection surgery. She reports that she was giving medications that she was been using since the surgery. She states that in her assisted living apartment they were painting with the doors open. She reports that she is unsure if it is due to mosquitos. She reports that she had her apartment checked for bed bugs 2.5-3 weeks ago due to the other floors in the apartment having bed bugs and her apartment was negative for bed bugs. She states that she is the only one that lives in her apartment. Pt denies new soaps/medications/pets/environment/lotion/detergent. Pt is having associated symptoms of color change. She notes that she has not tried any medications for the relief of her symptoms. She denies wound, swelling, and any other symptoms.    Past Medical History  Diagnosis Date  . Hypertension   . Myopia   . Anxiety state, unspecified   . Morbid obesity ([place])   . Other and unspecified hyperlipidemia   . Unspecified essential hypertension   . Osteopenia   . Umbilical hernia   . Palpitations   . Arthritis   . H/O: hysterectomy   . NSVT (nonsustained ventricular  tachycardia) ([place])     a. 01/2005 Echo: EF 55-65%   Past Surgical History  Procedure Laterality Date  . Laparoscopic transabdominal hernia N/A 05/03/2014    Procedure: LAPAROSCOPIC EXPLORATION WITH OPEN REPAIR OF INCARCERATED EPIGASTRIC HERNIA ;  Surgeon: Alphonsa Overall, MD;  Location: [facility];  Service: General;  Laterality: N/A;  . Abdominal hysterectomy  1974  . Esophagogastroduodenoscopy N/A 05/28/2014    Procedure: ESOPHAGOGASTRODUODENOSCOPY (EGD);  Surgeon: Teena Irani, MD;  Location: [facility];  Service: Endoscopy;  Laterality: N/A;  . Laparoscopic small bowel resection N/A 06/04/2014    Procedure: LAPAROSCOPIC SMALL BOWEL RESECTION;  Surgeon: Fanny Skates, MD;  Location: [facility];  Service: General;  Laterality: N/A;   Family History  Problem Relation Age of Onset  . Heart disease Mother   . Diabetes Mother   . Thyroid cancer Sister   . Diabetes Sister   . Hypertension Brother    Social History  Substance Use Topics  . Smoking status: Never Smoker   . Smokeless tobacco: Never Used  . Alcohol Use: No   OB History    Gravida Para Term Preterm AB TAB SAB Ectopic Multiple Living   0 0 0 0 0 0 0 0       Review of Systems  Constitutional: Negative for fever and chills.  Musculoskeletal: Negative for joint swelling.  Skin: Positive for color change and rash. Negative for wound.  Allergies  Mushroom extract complex; Codeine; Hydrocodone; Codeine; and Prednisone  Home Medications   Prior to Admission medications   Medication Sig Start Date End Date Taking? Authorizing Provider  acetaminophen (TYLENOL) 500 MG tablet Take 1,000 mg by mouth every 6 (six) hours as needed for mild pain or moderate pain.     Historical Provider, MD  amoxicillin (AMOXIL) 500 MG capsule Take 2 capsules (1,000 mg total) by mouth every 12 (twelve) hours. Patient not taking: Reported on 7/0/6237 07/06/29   Leighton Ruff, MD  carvedilol (COREG) 25 MG tablet Take 1 tablet (25 mg total) by mouth 2  (two) times daily with a meal. 05/11/14   Tresa Garter, MD  clarithromycin (BIAXIN) 500 MG tablet Take 1 tablet (500 mg total) by mouth every 12 (twelve) hours. 05/26/59   Leighton Ruff, MD  furosemide (LASIX) 20 MG tablet Take 1 tablet (20 mg total) by mouth daily. 06/15/14   Tresa Garter, MD  loratadine (CLARITIN) 10 MG tablet Take 10 mg by mouth daily as needed for allergies (allergies).    Historical Provider, MD  lovastatin (MEVACOR) 20 MG tablet Take 1 tablet (20 mg total) by mouth at bedtime. 05/11/14   Tresa Garter, MD  oxyCODONE (OXY IR/ROXICODONE) 5 MG immediate release tablet Take 1 tablet (5 mg total) by mouth every 4 (four) hours as needed for moderate pain. Patient not taking: Reported on 6/0/7371 0/62/69   Leighton Ruff, MD  pantoprazole (PROTONIX) 40 MG tablet Take 1 tablet (40 mg total) by mouth daily. 06/15/14   Tresa Garter, MD  spironolactone (ALDACTONE) 25 MG tablet Take 1 tablet (25 mg total) by mouth daily. 05/11/14   Tresa Garter, MD  sucralfate (CARAFATE) 1 G tablet Take 1 tablet (1 g total) by mouth 4 (four) times daily -  with meals and at bedtime. 4/85/46   Leighton Ruff, MD  verapamil (CALAN) 120 MG tablet TAKE ONE-HALF TABLET BY MOUTH THREE TIMES DAILY Patient taking differently: Take 60 mg by mouth 3 (three) times daily.  05/11/14   Tresa Garter, MD   BP 130/86 mmHg  Pulse 86  Temp(Src) 98 F (36.7 C) (Oral)  Resp 18  SpO2 96%  LMP 04/05/2012 Physical Exam  Constitutional: She is oriented to person, place, and time. She appears well-developed and well-nourished. No distress.  HENT:  Head: Normocephalic and atraumatic.  Eyes: EOM are normal.  Neck: Neck supple.  Cardiovascular: Normal rate.   Pulmonary/Chest: Effort normal. No respiratory distress.  Abdominal: Soft. There is no tenderness.  Musculoskeletal: Normal range of motion.  Neurological: She is alert and oriented to person, place, and time.  Skin: Skin is warm and dry.   Scribe Chaperone Present. Several insect bites at different stage of healing to her upper and lower extremities. No sign of surrounding cellulitis. Some excoriation noted.   Psychiatric: She has a normal mood and affect. Her behavior is normal.  Nursing note and vitals reviewed.   ED Course  Procedures (including critical care time) DIAGNOSTIC STUDIES: Oxygen Saturation is 96% on RA, nl by my interpretation.    COORDINATION OF CARE: 1:16 PM Discussed treatment plan with pt at bedside which includes Permethrin Rx and f/u with PCP and pt agreed to plan.    Labs Review Labs Reviewed - No data to display  Imaging Review No results found.    EKG Interpretation None      MDM   Final diagnoses:  Rash    Labs:  Imaging:   Consults:   Therapeutics:   Discharge Meds: Permethrin Rx  Assessment/Plan: This is likely bed bugs, pt insisted that she has had her apartment checked and exterminated and that these are not bed bugs, differential includes scabies. Due to patient persistence and pruritis, she will be given a dose of permethrin for potential scabies. This option was discussed with the patient who agrees that this would be the next best option. Pt is instructed to follow up with her PCP if her symptoms persists.  Pt verbalized understanding and agreement to today's plan and had no further questions or concerns at this time.   1:37 PM- Pt ambulated out of the ED before the Rx printed. Asked patient if she would like a Rx to be printed and she refused, stating that she will f/u with her PCP.   I personally performed the services described in this documentation, which was scribed in my presence. The recorded information has been reviewed and is accurate.    Okey Regal, PA-C 11/03/14 1649  Daleen Bo, MD 11/06/14 [phone]

## 2014-11-03 NOTE — ED Notes (Signed)
Pt sts hives x 1 month; pt sts unsure of cause

## 2014-11-03 NOTE — Telephone Encounter (Signed)
Patient verified DOB Patient states she had 2 surgeries in June. Patient states she saw Dr. Doreene Burke after surgeries. Patient states Dr. Doreene Burke changed her Lasix from 1 pill to 1.5 pills a day. Patient states she has been having swelling in her feet. Patient states she now has "bites" ever since she stopped the medications. Patient was treated for bed bugs but nothing was found upon examination. Patient states ER diagnosed bumps as bed bugs. The bumps do itch.  Patient is requesting an appointment.  Patient states sites turn black after itching.  Patient states sites are all over her body.

## 2014-11-03 NOTE — Telephone Encounter (Signed)
Patient called and requested a call back from PCP. Please f/u

## 2014-11-03 NOTE — ED Notes (Signed)
Patient upset "I don't have no bedbugs or scabies.  I lived there for 3 years and have never had nothing".   Patient states "they didn't do any blood tests or nothing".   I asked patient if she would like PA to come back in and talk with her, she refused.

## 2014-11-03 NOTE — ED Notes (Signed)
Pt walked out. "He never came back in so I'm leaving".

## 2014-11-09 NOTE — Telephone Encounter (Signed)
Patient has an appointment scheduled for November 19, 2014.

## 2014-11-19 ENCOUNTER — Encounter: Payer: Self-pay | Admitting: Internal Medicine

## 2014-11-19 ENCOUNTER — Ambulatory Visit: Payer: Commercial Managed Care - HMO | Attending: Internal Medicine | Admitting: Internal Medicine

## 2014-11-19 VITALS — BP 145/87 | HR 78 | Temp 98.2°F | Resp 18 | Ht 62.0 in | Wt 185.6 lb

## 2014-11-19 DIAGNOSIS — E7800 Pure hypercholesterolemia, unspecified: Secondary | ICD-10-CM | POA: Insufficient documentation

## 2014-11-19 DIAGNOSIS — I10 Essential (primary) hypertension: Secondary | ICD-10-CM | POA: Diagnosis not present

## 2014-11-19 DIAGNOSIS — Z1211 Encounter for screening for malignant neoplasm of colon: Secondary | ICD-10-CM

## 2014-11-19 DIAGNOSIS — I4729 Other ventricular tachycardia: Secondary | ICD-10-CM

## 2014-11-19 DIAGNOSIS — I472 Ventricular tachycardia: Secondary | ICD-10-CM | POA: Insufficient documentation

## 2014-11-19 LAB — COMPLETE METABOLIC PANEL WITH GFR
ALT: 19 U/L (ref 6–29)
AST: 18 U/L (ref 10–35)
Albumin: 4.4 g/dL (ref 3.6–5.1)
Alkaline Phosphatase: 59 U/L (ref 33–130)
BUN: 14 mg/dL (ref 7–25)
CALCIUM: 9.8 mg/dL (ref 8.6–10.4)
CO2: 28 mmol/L (ref 20–31)
CREATININE: 1.03 mg/dL — AB (ref 0.50–0.99)
Chloride: 102 mmol/L (ref 98–110)
GFR, Est African American: 65 mL/min (ref >=60)
GFR, Est Non African American: 57 mL/min — ABNORMAL LOW (ref >=60)
Glucose, Bld: 96 mg/dL (ref 65–99)
POTASSIUM: 4.3 mmol/L (ref 3.5–5.3)
SODIUM: 139 mmol/L (ref 135–146)
Total Bilirubin: 0.5 mg/dL (ref 0.2–1.2)
Total Protein: 7.8 g/dL (ref 6.1–8.1)

## 2014-11-19 LAB — LIPID PANEL
CHOL/HDL RATIO: 3.5 ratio (ref <=5.0)
CHOLESTEROL: 161 mg/dL (ref 125–200)
HDL: 46 mg/dL (ref >=46)
LDL CALC: 96 mg/dL (ref <130)
Triglycerides: 93 mg/dL (ref <150)
VLDL: 19 mg/dL (ref <30)

## 2014-11-19 MED ORDER — VERAPAMIL HCL 120 MG PO TABS: ORAL_TABLET | ORAL | Status: DC

## 2014-11-19 NOTE — Progress Notes (Signed)
Patient here for F/U for Heart Failure and HTN  Patient denies pain at this time.  Patient states she is upset with the facility and will speak with Dr. Doreene Burke about her concerns. BP- 145/87

## 2014-11-19 NOTE — Patient Instructions (Signed)
DASH Eating Plan °DASH stands for "Dietary Approaches to Stop Hypertension." The DASH eating plan is a healthy eating plan that has been shown to reduce high blood pressure (hypertension). Additional health benefits may include reducing the risk of type 2 diabetes mellitus, heart disease, and stroke. The DASH eating plan may also help with weight loss. °WHAT DO I NEED TO KNOW ABOUT THE DASH EATING PLAN? °For the DASH eating plan, you will follow these general guidelines: °· Choose foods with a percent daily value for sodium of less than 5% (as listed on the food label). °· Use salt-free seasonings or herbs instead of table salt or sea salt. °· Check with your health care provider or pharmacist before using salt substitutes. °· Eat lower-sodium products, often labeled as "lower sodium" or "no salt added." °· Eat fresh foods. °· Eat more vegetables, fruits, and low-fat dairy products. °· Choose whole grains. Look for the word "whole" as the first word in the ingredient list. °· Choose fish and skinless chicken or turkey more often than red meat. Limit fish, poultry, and meat to 6 oz (170 g) each day. °· Limit sweets, desserts, sugars, and sugary drinks. °· Choose heart-healthy fats. °· Limit cheese to 1 oz (28 g) per day. °· Eat more home-cooked food and less restaurant, buffet, and fast food. °· Limit fried foods. °· Cook foods using methods other than frying. °· Limit canned vegetables. If you do use them, rinse them well to decrease the sodium. °· When eating at a restaurant, ask that your food be prepared with less salt, or no salt if possible. °WHAT FOODS CAN I EAT? °Seek help from a dietitian for individual calorie needs. °Grains °Whole grain or whole wheat bread. Brown rice. Whole grain or whole wheat pasta. Quinoa, bulgur, and whole grain cereals. Low-sodium cereals. Corn or whole wheat flour tortillas. Whole grain cornbread. Whole grain crackers. Low-sodium crackers. °Vegetables °Fresh or frozen vegetables  (raw, steamed, roasted, or grilled). Low-sodium or reduced-sodium tomato and vegetable juices. Low-sodium or reduced-sodium tomato sauce and paste. Low-sodium or reduced-sodium canned vegetables.  °Fruits °All fresh, canned (in natural juice), or frozen fruits. °Meat and Other Protein Products °Ground beef (85% or leaner), grass-fed beef, or beef trimmed of fat. Skinless chicken or turkey. Ground chicken or turkey. Pork trimmed of fat. All fish and seafood. Eggs. Dried beans, peas, or lentils. Unsalted nuts and seeds. Unsalted canned beans. °Dairy °Low-fat dairy products, such as skim or 1% milk, 2% or reduced-fat cheeses, low-fat ricotta or cottage cheese, or plain low-fat yogurt. Low-sodium or reduced-sodium cheeses. °Fats and Oils °Tub margarines without trans fats. Light or reduced-fat mayonnaise and salad dressings (reduced sodium). Avocado. Safflower, olive, or canola oils. Natural peanut or almond butter. °Other °Unsalted popcorn and pretzels. °The items listed above may not be a complete list of recommended foods or beverages. Contact your dietitian for more options. °WHAT FOODS ARE NOT RECOMMENDED? °Grains °White bread. White pasta. White rice. Refined cornbread. Bagels and croissants. Crackers that contain trans fat. °Vegetables °Creamed or fried vegetables. Vegetables in a cheese sauce. Regular canned vegetables. Regular canned tomato sauce and paste. Regular tomato and vegetable juices. °Fruits °Dried fruits. Canned fruit in light or heavy syrup. Fruit juice. °Meat and Other Protein Products °Fatty cuts of meat. Ribs, chicken wings, bacon, sausage, bologna, salami, chitterlings, fatback, hot dogs, bratwurst, and packaged luncheon meats. Salted nuts and seeds. Canned beans with salt. °Dairy °Whole or 2% milk, cream, half-and-half, and cream cheese. Whole-fat or sweetened yogurt. Full-fat   cheeses or blue cheese. Nondairy creamers and whipped toppings. Processed cheese, cheese spreads, or cheese  curds. °Condiments °Onion and garlic salt, seasoned salt, table salt, and sea salt. Canned and packaged gravies. Worcestershire sauce. Tartar sauce. Barbecue sauce. Teriyaki sauce. Soy sauce, including reduced sodium. Steak sauce. Fish sauce. Oyster sauce. Cocktail sauce. Horseradish. Ketchup and mustard. Meat flavorings and tenderizers. Bouillon cubes. Hot sauce. Tabasco sauce. Marinades. Taco seasonings. Relishes. °Fats and Oils °Butter, stick margarine, lard, shortening, ghee, and bacon fat. Coconut, palm kernel, or palm oils. Regular salad dressings. °Other °Pickles and olives. Salted popcorn and pretzels. °The items listed above may not be a complete list of foods and beverages to avoid. Contact your dietitian for more information. °WHERE CAN I FIND MORE INFORMATION? °National Heart, Lung, [employer] Institute: www.nhlbi.nih.gov/health/health-topics/topics/dash/ °  °This information is not intended to replace advice given to you by your health care provider. Make sure you discuss any questions you have with your health care provider. °  °Document Released: 12/15/2010 Document Revised: 01/16/2014 Document Reviewed: 10/30/2012 °Elsevier Interactive Patient Education ©2016 Elsevier Inc. ° °Hypertension °Hypertension, commonly called high blood pressure, is when the force of blood pumping through your arteries is too strong. Your arteries are the blood vessels that carry blood from your heart throughout your body. A blood pressure reading consists of a higher number over a lower number, such as 110/72. The higher number (systolic) is the pressure inside your arteries when your heart pumps. The lower number (diastolic) is the pressure inside your arteries when your heart relaxes. Ideally you want your blood pressure below 120/80. °Hypertension forces your heart to work harder to pump blood. Your arteries may become narrow or stiff. Having untreated or uncontrolled hypertension can cause heart attack, stroke, kidney  disease, and other problems. °RISK FACTORS °Some risk factors for high blood pressure are controllable. Others are not.  °Risk factors you cannot control include:  °· Race. You may be at higher risk if you are African American. °· Age. Risk increases with age. °· Gender. Men are at higher risk than women before age 45 years. After age 65, women are at higher risk than men. °Risk factors you can control include: °· Not getting enough exercise or physical activity. °· Being overweight. °· Getting too much fat, sugar, calories, or salt in your diet. °· Drinking too much alcohol. °SIGNS AND SYMPTOMS °Hypertension does not usually cause signs or symptoms. Extremely high blood pressure (hypertensive crisis) may cause headache, anxiety, shortness of breath, and nosebleed. °DIAGNOSIS °To check if you have hypertension, your health care provider will measure your blood pressure while you are seated, with your arm held at the level of your heart. It should be measured at least twice using the same arm. Certain conditions can cause a difference in blood pressure between your right and left arms. A blood pressure reading that is higher than normal on one occasion does not mean that you need treatment. If it is not clear whether you have high blood pressure, you may be asked to return on a different day to have your blood pressure checked again. Or, you may be asked to monitor your blood pressure at home for 1 or more weeks. °TREATMENT °Treating high blood pressure includes making lifestyle changes and possibly taking medicine. Living a healthy lifestyle can help lower high blood pressure. You may need to change some of your habits. °Lifestyle changes may include: °· Following the DASH diet. This diet is high in fruits, vegetables, and whole   grains. It is low in salt, red meat, and added sugars. °· Keep your sodium intake below 2,300 mg per day. °· Getting at least 30-45 minutes of aerobic exercise at least 4 times per  week. °· Losing weight if necessary. °· Not smoking. °· Limiting alcoholic beverages. °· Learning ways to reduce stress. °Your health care provider may prescribe medicine if lifestyle changes are not enough to get your blood pressure under control, and if one of the following is true: °· You are 18-59 years of age and your systolic blood pressure is above 140. °· You are 60 years of age or older, and your systolic blood pressure is above 150. °· Your diastolic blood pressure is above 90. °· You have diabetes, and your systolic blood pressure is over 140 or your diastolic blood pressure is over 90. °· You have kidney disease and your blood pressure is above 140/90. °· You have heart disease and your blood pressure is above 140/90. °Your personal target blood pressure may vary depending on your medical conditions, your age, and other factors. °HOME CARE INSTRUCTIONS °· Have your blood pressure rechecked as directed by your health care provider.   °· Take medicines only as directed by your health care provider. Follow the directions carefully. Blood pressure medicines must be taken as prescribed. The medicine does not work as well when you skip doses. Skipping doses also puts you at risk for problems. °· Do not smoke.   °· Monitor your blood pressure at home as directed by your health care provider.  °SEEK MEDICAL CARE IF:  °· You think you are having a reaction to medicines taken. °· You have recurrent headaches or feel dizzy. °· You have swelling in your ankles. °· You have trouble with your vision. °SEEK IMMEDIATE MEDICAL CARE IF: °· You develop a severe headache or confusion. °· You have unusual weakness, numbness, or feel faint. °· You have severe chest or abdominal pain. °· You vomit repeatedly. °· You have trouble breathing. °MAKE SURE YOU:  °· Understand these instructions. °· Will watch your condition. °· Will get help right away if you are not doing well or get worse. °  °This information is not intended to  replace advice given to you by your health care provider. Make sure you discuss any questions you have with your health care provider. °  °Document Released: 12/26/2004 Document Revised: 05/12/2014 Document Reviewed: 10/18/2012 °Elsevier Interactive Patient Education ©2016 Elsevier Inc. ° °

## 2014-11-19 NOTE — Progress Notes (Signed)
[name], female   DOB: 03-09-1950, 64 y.o.   MRN: [id number]   Michelle Hurley, is a 64 y.o. female  [id number]  [id number]  DOB - Dec 31, 1950  Chief Complaint  Patient presents with  . Follow-up        Subjective:   Michelle Hurley is a 64 y.o. female here today for a follow up visit. Patient has history of essential hypertension, dyslipidemia, generalized anxiety, morbid obesity, paroxysmal ventricular tachycardia currently on verapamil. Patient was recently seen in the ED for possible bedbug infestation and generalized hive. She is here today for ED follow-up. Her new complaint is occasional palpitation and feeling of generalized weakness associated with the palpitation. She controls this with deep breathing exercise and closing her eyes, each episode lasts for a few seconds and then she feels better. She has been told by another doctor that this may be due to anxiety attack. She has not been taking her verapamil as prescribed, she is taking 60 mg tablet by mouth twice a day instead of 3 times a day. She denies any syncopal episode, she denies any chest pain. Patient has No headache, No abdominal pain - No Nausea, No new weakness tingling or numbness. She will be traveling to [facility] after the Thanksgiving holiday she wants to know exactly what's going on. She does not smoke cigarettes, she does not drink alcohol.  Problem  Colon Cancer Screening    ALLERGIES: Allergies  Allergen Reactions  . Mushroom Extract Complex Anaphylaxis, Itching and Swelling    Swelling all over body   . Codeine Nausea And Vomiting  . Hydrocodone Nausea And Vomiting  . Codeine Nausea And Vomiting  . Prednisone Nausea And Vomiting    PAST MEDICAL HISTORY: Past Medical History  Diagnosis Date  . Hypertension   . Myopia   . Anxiety state, unspecified   . Morbid obesity (HCC)   . Other and unspecified hyperlipidemia   . Unspecified essential hypertension   . Osteopenia    . Umbilical hernia   . Palpitations   . Arthritis   . H/O: hysterectomy   . NSVT (nonsustained ventricular tachycardia) (HCC)     a. 01/2005 Echo: EF 55-65%    MEDICATIONS AT HOME: Prior to Admission medications   Medication Sig Start Date End Date Taking? Authorizing Provider  acetaminophen (TYLENOL) 500 MG tablet Take 1,000 mg by mouth every 6 (six) hours as needed for mild pain or moderate pain.    Yes Historical Provider, MD  carvedilol (COREG) 25 MG tablet Take 1 tablet (25 mg total) by mouth 2 (two) times daily with a meal. 05/11/14  Yes Vanessa Alesi E Hyman Hopes, MD  furosemide (LASIX) 20 MG tablet Take 1 tablet (20 mg total) by mouth daily. 06/15/14  Yes Quentin Angst, MD  loratadine (CLARITIN) 10 MG tablet Take 10 mg by mouth daily as needed for allergies (allergies).   Yes Historical Provider, MD  lovastatin (MEVACOR) 20 MG tablet Take 1 tablet (20 mg total) by mouth at bedtime. 05/11/14  Yes Quentin Angst, MD  spironolactone (ALDACTONE) 25 MG tablet Take 1 tablet (25 mg total) by mouth daily. 05/11/14  Yes Ciclaly Mulcahey Annitta Needs, MD  sucralfate (CARAFATE) 1 G tablet Take 1 tablet (1 g total) by mouth 4 (four) times daily -  with meals and at bedtime. 06/08/14  Yes Romie Levee, MD  verapamil (CALAN) 120 MG tablet TAKE ONE-HALF TABLET BY MOUTH THREE TIMES DAILY 11/19/14  Yes Quentin Angst, MD  amoxicillin (AMOXIL) 500 MG capsule Take 2 capsules (1,000 mg total) by mouth every 12 (twelve) hours. Patient not taking: Reported on 11/19/2014 06/08/14   Romie Levee, MD  oxyCODONE (OXY IR/ROXICODONE) 5 MG immediate release tablet Take 1 tablet (5 mg total) by mouth every 4 (four) hours as needed for moderate pain. Patient not taking: Reported on 11/19/2014 06/08/14   Romie Levee, MD  pantoprazole (PROTONIX) 40 MG tablet Take 1 tablet (40 mg total) by mouth daily. Patient not taking: Reported on 11/19/2014 06/15/14   Quentin Angst, MD     Objective:   Filed Vitals:   11/19/14  1024  BP: 145/87  Pulse: 78  Temp: 98.2 F (36.8 C)  TempSrc: Oral  Resp: 18  Height: 5\' 2"  (1.575 m)  Weight: 185 lb 9.6 oz (84.188 kg)  SpO2: 97%    Exam General appearance : Awake, alert, not in any distress. Speech Clear. Not toxic looking HEENT: Atraumatic and Normocephalic, pupils equally reactive to light and accomodation Neck: supple, no JVD. No cervical lymphadenopathy.  Chest:Good air entry bilaterally, no added sounds  CVS: S1 S2 regular, no murmurs.  Abdomen: Bowel sounds present, Non tender and not distended with no gaurding, rigidity or rebound. Extremities: B/L Lower Ext shows no edema, both legs are warm to touch Neurology: Awake alert, and oriented X 3, CN II-XII intact, Non focal  Data Review Lab Results  Component Value Date   HGBA1C 5.9 04/24/2013   HGBA1C 5.7 08/15/2012   HGBA1C 6.4* 04/06/2012     Assessment & Plan   1. Essential hypertension  - verapamil (CALAN) 120 MG tablet; TAKE ONE-HALF TABLET BY MOUTH THREE TIMES DAILY  Dispense: 90 tablet; Refill: 3 - COMPLETE METABOLIC PANEL WITH GFR - Urinalysis, Complete  We have discussed target BP range and blood pressure goal. I have advised patient to check BP regularly and to call us back or report to clinic if the numbers are consistently higher than 140/90. We discussed the importance of compliance with medical therapy and DASH diet recommended, consequences of uncontrolled hypertension discussed.   - continue current BP medications  2. High cholesterol  - Lipid panel  To address this please limit saturated fat to no more than 7% of your calories, limit cholesterol to 200 mg/day, increase fiber and exercise as tolerated. If needed we may add another cholesterol lowering medication to your regimen.   3. Paroxysmal ventricular tachycardia (HCC)  Patient's current symptoms may be due to her PVT, patient has been encouraged to take verapamil as prescribed instead of 2 times a day, report back to  clinic in one week to report changes in current symptoms of palpitations. We will order an echocardiogram to reevaluate the structural integrity of the heart.  - verapamil (CALAN) 120 MG tablet; TAKE ONE-HALF TABLET BY MOUTH THREE TIMES DAILY  Dispense: 90 tablet; Refill: 3  - Echocardiogram; Future  4. Colon cancer screening  - Ambulatory referral to Gastroenterology  Patient have been counseled extensively about nutrition and exercise  Return in about 3 months (around 02/19/2015), or if symptoms worsen or fail to improve, for Follow up HTN, Routine Follow Up.  The patient was given clear instructions to go to ER or return to medical center if symptoms don't improve, worsen or new problems develop. The patient verbalized understanding. The patient was told to call to get lab results if they haven't heard anything in the next week.   This note has been created with Teaching laboratory technician  and smart Lobbyist. Any transcriptional errors are unintentional.    Jeanann Lewandowsky, MD, MHA, FACP, FAAP, CPE [facility] [place], [place] [phone]   11/19/2014, 11:24 AM

## 2014-11-20 ENCOUNTER — Telehealth: Payer: Self-pay | Admitting: *Deleted

## 2014-11-20 LAB — URINALYSIS, COMPLETE
Bacteria, UA: NONE SEEN [HPF]
Bilirubin Urine: NEGATIVE
CASTS: NONE SEEN [LPF]
Crystals: NONE SEEN [HPF]
Glucose, UA: NEGATIVE
Ketones, ur: NEGATIVE
NITRITE: NEGATIVE
Protein, ur: NEGATIVE
RBC / HPF: NONE SEEN RBC/HPF (ref <=2)
SPECIFIC GRAVITY, URINE: 1.008 (ref 1.001–1.035)
SQUAMOUS EPITHELIAL / LPF: NONE SEEN [HPF] (ref <=5)
YEAST: NONE SEEN [HPF]
pH: 7 (ref 5.0–8.0)

## 2014-11-20 NOTE — Telephone Encounter (Signed)
Patient verified DOB Patient was informed of ECHO being scheduled at [facility] on the first floor Radiology department. Patient advised to arrive 15 minutes prior to appointment time which is 3:00pm. Patient wrote information down and had no further questions.

## 2014-11-23 ENCOUNTER — Ambulatory Visit (HOSPITAL_COMMUNITY)
Admission: RE | Admit: 2014-11-23 | Discharge: 2014-11-23 | Disposition: A | Discharge status: Home / Self Care | Payer: Commercial Managed Care - HMO | Source: Ambulatory Visit | Attending: Internal Medicine | Admitting: Internal Medicine

## 2014-11-23 DIAGNOSIS — I10 Essential (primary) hypertension: Secondary | ICD-10-CM | POA: Insufficient documentation

## 2014-11-23 DIAGNOSIS — I472 Ventricular tachycardia, unspecified: Secondary | ICD-10-CM

## 2014-11-23 DIAGNOSIS — I340 Nonrheumatic mitral (valve) insufficiency: Secondary | ICD-10-CM | POA: Insufficient documentation

## 2014-11-23 DIAGNOSIS — I517 Cardiomegaly: Secondary | ICD-10-CM | POA: Insufficient documentation

## 2014-11-23 DIAGNOSIS — I429 Cardiomyopathy, unspecified: Secondary | ICD-10-CM | POA: Insufficient documentation

## 2014-11-23 DIAGNOSIS — I4729 Other ventricular tachycardia: Secondary | ICD-10-CM

## 2014-11-23 NOTE — Progress Notes (Signed)
  Echocardiogram 2D Echocardiogram has been performed.  Michelle Hurley 11/23/2014, 4:10 PM

## 2014-11-25 ENCOUNTER — Telehealth: Payer: Self-pay | Admitting: *Deleted

## 2014-11-25 NOTE — Telephone Encounter (Signed)
-----   Message from Tresa Garter, MD sent at 11/25/2014  8:36 AM EST ----- Please inform patient that her laboratory test results are mostly normal.

## 2014-11-25 NOTE — Telephone Encounter (Signed)
Patient verified DOB Patient was made aware of her lab results being mostly normal. Patient is going to stop by the office tomorrow per Dr. Christa See request. Patient had no further questions.

## 2014-11-30 ENCOUNTER — Telehealth: Payer: Self-pay | Admitting: Internal Medicine

## 2014-11-30 NOTE — Telephone Encounter (Signed)
Patient called stating that [facility] faxed over a form that her PCP needs to fill out in order to get her medications through her insurance but that the insurance has not herd anything back. Please f/u with pt.

## 2014-11-30 NOTE — Telephone Encounter (Signed)
-----   Message from Tresa Garter, MD sent at 11/25/2014  8:35 AM EST ----- Please inform patient that her heart ultrasound is normal.

## 2014-11-30 NOTE — Telephone Encounter (Signed)
Patient verified DOB Patient states she has had 3 panic attacks since Friday evening "hot flashes, heart pounding". Patient was informed of ultrasound of heart being normal. Patient states she took a whole pill of furosemide on Friday and Saturday. Patient complains of slight attacks. Patient was advised of staff receiving paperwork regarding [employer].  Patients paperwork has been faxed to [facility].

## 2014-12-14 NOTE — Telephone Encounter (Signed)
Pt. Called requesting to speak to nurse regarding a bump in her vaginal area. Pt. Has not been feeling good. Please f/u with pt.

## 2014-12-28 NOTE — Telephone Encounter (Signed)
Medical Assistant left message on patient's home and cell voicemail. Voicemail states to give a call back to Cherlynn Popiel with [facility] at [phone].  

## 2014-12-30 NOTE — Telephone Encounter (Signed)
error 

## 2015-01-21 ENCOUNTER — Ambulatory Visit: Payer: Commercial Managed Care - HMO | Attending: Internal Medicine | Admitting: Physician Assistant

## 2015-01-21 VITALS — BP 124/80 | HR 66 | Temp 98.4°F | Resp 16 | Ht 62.5 in | Wt 187.0 lb

## 2015-01-21 DIAGNOSIS — S93402A Sprain of unspecified ligament of left ankle, initial encounter: Secondary | ICD-10-CM | POA: Diagnosis not present

## 2015-01-21 DIAGNOSIS — J302 Other seasonal allergic rhinitis: Secondary | ICD-10-CM | POA: Insufficient documentation

## 2015-01-21 DIAGNOSIS — M25572 Pain in left ankle and joints of left foot: Secondary | ICD-10-CM | POA: Diagnosis not present

## 2015-01-21 DIAGNOSIS — Z79899 Other long term (current) drug therapy: Secondary | ICD-10-CM | POA: Diagnosis not present

## 2015-01-21 NOTE — Progress Notes (Signed)
Patient c/o leg pain.   Patient states she twisted her left ankle x 3wks ago and reports that she was having some swelling. Patient denies pain for 2wks now. But is concern of a possible hairline fracture.  Patient asking for something for her allergies and have it sent through [facility] home delivery.

## 2015-01-21 NOTE — Progress Notes (Signed)
Chief Complaint: Left ankle injury  Subjective: This is a pleasant 65 year old female who is presenting today after having an ankle injury 3-4 weeks ago. She states that she doesn't remember exactly what she did but she woke up one morning in her left ankle was swollen. It was tender to touch. It also caused a great deal of pain to walk. She questions whether she twisted it. She took some over-the-counter pain medications and over the last couple weeks her ankle has settled down. Is back to normal. She even took a trip here recently without incident. She made the appointment several weeks ago and thought about canceling today but came on in any way.  She also has seasonal allergies. She takes Claritin daily. She is considering using a mail order prescription will be in contact to potentially change this over to a 90 day supply.   ROS:  GEN: denies fever or chills, denies change in weight HEENT: denies headache, earache, epistaxis, sore throat, or neck pain LUNGS: denies SHOB, dyspnea, PND, orthopnea CV: denies CP or palpitations ABD: denies abd pain, N or V EXT: denies muscle spasms or swelling; no pain in lower ext, no weakness NEURO: denies numbness or tingling, denies sz, stroke or TIA   Objective:  Filed Vitals:   01/21/15 0908  BP: 124/80  Pulse: 66  Temp: 98.4 F (36.9 C)  TempSrc: Oral  Resp: 16  Height: 5' 2.5" (1.588 m)  Weight: 187 lb (84.823 kg)  SpO2: 99%    Physical Exam:  General: in no acute distress. Extremities: No clubbing cyanosis or edema with positive pedal pulses. NL ROM. Neuro: Alert, awake, oriented x3, nonfocal.    Medications: Prior to Admission medications   Medication Sig Start Date End Date Taking? Authorizing Provider  carvedilol (COREG) 25 MG tablet Take 1 tablet (25 mg total) by mouth 2 (two) times daily with a meal. 05/11/14  Yes Olugbemiga E Doreene Burke, MD  furosemide (LASIX) 20 MG tablet Take 1 tablet (20 mg total) by mouth daily. 06/15/14  Yes  Tresa Garter, MD  loratadine (CLARITIN) 10 MG tablet Take 10 mg by mouth daily as needed for allergies (allergies).   Yes Historical Provider, MD  lovastatin (MEVACOR) 20 MG tablet Take 1 tablet (20 mg total) by mouth at bedtime. 05/11/14  Yes Tresa Garter, MD  spironolactone (ALDACTONE) 25 MG tablet Take 1 tablet (25 mg total) by mouth daily. 05/11/14  Yes Tresa Garter, MD  verapamil (CALAN) 120 MG tablet TAKE ONE-HALF TABLET BY MOUTH THREE TIMES DAILY 11/19/14  Yes Tresa Garter, MD  acetaminophen (TYLENOL) 500 MG tablet Take 1,000 mg by mouth every 6 (six) hours as needed for mild pain or moderate pain. Reported on 01/21/2015    Historical Provider, MD  amoxicillin (AMOXIL) 500 MG capsule Take 2 capsules (1,000 mg total) by mouth every 12 (twelve) hours. Patient not taking: Reported on [id number]   Leighton Ruff, MD  oxyCODONE (OXY IR/ROXICODONE) 5 MG immediate release tablet Take 1 tablet (5 mg total) by mouth every 4 (four) hours as needed for moderate pain. Patient not taking: Reported on [id number]   Leighton Ruff, MD  pantoprazole (PROTONIX) 40 MG tablet Take 1 tablet (40 mg total) by mouth daily. Patient not taking: Reported on 11/19/2014 06/15/14   Tresa Garter, MD  sucralfate (CARAFATE) 1 G tablet Take 1 tablet (1 g total) by mouth 4 (four) times daily -  with meals and at bedtime. Patient not taking: Reported  on [id number]   Leighton Ruff, MD    Assessment: 1. Left ankle pain-resolved 2. Seasonal/Environmental Allergies   Plan: Nothing to do at this time for left ankle. Her sxs have resolved. Encouraged her to contact me if she has a recurrence.  She will contact us when she is ready for her meds to be changed over to [facility] 90 day supply/mail order.  Follow up:as scheduled  The patient was given clear instructions to go to ER or return to medical center if symptoms don't improve, worsen or new problems develop. The patient  verbalized understanding. The patient was told to call to get lab results if they haven't heard anything in the next week.   This note has been created with Surveyor, quantity. Any transcriptional errors are unintentional.   Zettie Pho, PA-C 01/21/2015, 9:32 AM

## 2015-01-27 DIAGNOSIS — Z1211 Encounter for screening for malignant neoplasm of colon: Secondary | ICD-10-CM | POA: Diagnosis not present

## 2015-03-03 ENCOUNTER — Telehealth: Payer: Self-pay | Admitting: Internal Medicine

## 2015-03-03 NOTE — Telephone Encounter (Signed)
Pt. Called statating that [facility] sent a fax over to pt. PCP for her to be able to receive an allergy medication.  Pt. Stated the fax was sent to [facility] about three weeks ago and they have not received anything. Please f/u

## 2015-04-16 ENCOUNTER — Ambulatory Visit (INDEPENDENT_AMBULATORY_CARE_PROVIDER_SITE_OTHER): Payer: Commercial Managed Care - HMO | Admitting: Internal Medicine

## 2015-04-16 ENCOUNTER — Encounter: Payer: Self-pay | Admitting: Internal Medicine

## 2015-04-16 VITALS — BP 127/86 | Ht 62.0 in | Wt 189.2 lb

## 2015-04-16 DIAGNOSIS — R002 Palpitations: Secondary | ICD-10-CM

## 2015-04-16 DIAGNOSIS — Z0000 Encounter for general adult medical examination without abnormal findings: Secondary | ICD-10-CM | POA: Insufficient documentation

## 2015-04-16 DIAGNOSIS — R42 Dizziness and giddiness: Secondary | ICD-10-CM | POA: Diagnosis not present

## 2015-04-16 DIAGNOSIS — I10 Essential (primary) hypertension: Secondary | ICD-10-CM

## 2015-04-16 DIAGNOSIS — J309 Allergic rhinitis, unspecified: Secondary | ICD-10-CM

## 2015-04-16 DIAGNOSIS — Z23 Encounter for immunization: Secondary | ICD-10-CM | POA: Diagnosis not present

## 2015-04-16 DIAGNOSIS — J302 Other seasonal allergic rhinitis: Secondary | ICD-10-CM

## 2015-04-16 HISTORY — DX: Allergic rhinitis, unspecified: J30.9

## 2015-04-16 MED ORDER — LORATADINE 10 MG PO TABS: 10.0000 mg | ORAL_TABLET | Freq: Every day | ORAL | Status: DC | PRN

## 2015-04-16 NOTE — Patient Instructions (Signed)
Today we are checking your basic blood test to monitor your kidney function and make sure you are doing well with the lasix and spironolactone medications that are prescribed.  We gave you a vaccine for many of the most common pneumonia causing bacteria. This vaccine is protective for 7-10 years and reduces your risk of serious pneumonia infection by up to 60%.  Your blood pressure is looking great, keep up the good work!  Refill for your allergy medicine was sent to your mail order pharmacy, please call our clinic if you have further difficulties with this so I can talk to the pharmacy to address it.  A prescription for the shingles vaccine (Zostavax) was sent to local [place]. This should be covered by insurance but if you are told it is excessively expensive please let us know instead.

## 2015-04-17 LAB — BMP8+ANION GAP
Anion Gap: 20 mmol/L — ABNORMAL HIGH (ref 10.0–18.0)
BUN/Creatinine Ratio: 16 (ref 12–28)
BUN: 19 mg/dL (ref 8–27)
CALCIUM: 9.5 mg/dL (ref 8.7–10.3)
CHLORIDE: 97 mmol/L (ref 96–106)
CO2: 22 mmol/L (ref 18–29)
Creatinine, Ser: 1.16 mg/dL — ABNORMAL HIGH (ref 0.57–1.00)
GFR calc Af Amer: 57 mL/min/{1.73_m2} — ABNORMAL LOW (ref >59)
GFR, EST NON AFRICAN AMERICAN: 49 mL/min/{1.73_m2} — AB (ref >59)
Glucose: 98 mg/dL (ref 65–99)
POTASSIUM: 4.6 mmol/L (ref 3.5–5.2)
Sodium: 139 mmol/L (ref 134–144)

## 2015-04-19 ENCOUNTER — Encounter: Payer: Self-pay | Admitting: Internal Medicine

## 2015-04-19 NOTE — Assessment & Plan Note (Signed)
She is due for PCV23 pneumonia vaccination based on guidelines for preventative healthcare due to age. She also expressed some interest in shingles vaccination and should qualify for this based on her age. She had some concern about getting this and having to inject herself, and I recommended she contact the clinic again if she has trouble with obtaining or administering it and still wants to.  PCV23 vaccine today Prescription for Zostavax sent to local pharmacy

## 2015-04-19 NOTE — Assessment & Plan Note (Signed)
BP Readings from Last 3 Encounters:  04/16/15 127/86  01/21/15 124/80  11/19/14 145/87    Lab Results  Component Value Date   NA 139 04/16/2015   K 4.6 04/16/2015   CREATININE 1.16* 04/16/2015    Assessment: Blood pressure control: controlled Progress toward BP goal:  at goal Comments: Brought her medications to appointment, is taking Coreg 25mg  BID, lasix 40mg  daily. Her blood pressure is well controlled at this time.  Plan: Medications:  continue current medications Educational resources provided: brochure (declines) Self management tools provided: other (see comments) (doesnt use blood pressure cuff at home) Other plans: Bmet, on chronic diuretics, last was was nearly 1 year prior

## 2015-04-19 NOTE — Assessment & Plan Note (Signed)
Assessment: Mild symptoms of ?perennial allergic rhinitis. She reports being well controlled on 10mg  loratadine as per previous treatment. She has no major risk factors or other pulmonary disease for complications from this.  Plan: New prescription sent to [facility] mail order pharmacy

## 2015-04-19 NOTE — Assessment & Plan Note (Signed)
Assessment: History of dizziness and heart palpitations. Palpitations continue to occur infrequently but she denies episodic dyspnea or chest pain from these. She has not fallen from dizzy spells and they are infrequent currently off medication. On Coreg 25mg  BID.  Plan: No intervention, continue to monitor carefully for fall risk

## 2015-04-19 NOTE — Progress Notes (Signed)
Subjective:   [name] female   DOB: August 01, 1950 65 y.o.   MRN: [id number]  HPI: [name] is a 65 y.o. woman with PMHx detailed below presenting to clinic to establish care as she was previously followed at [facility] with Dr. Doreene Burke. She is feeling well today except for some rhinitis symptoms for about 2 weeks she attributes to running out of loratadine through her mail order pharmacy. This is accompanied with eye irritation and an occasional nonproductive cough.  See problem based assessment and plan below for additional details.  Past Medical History  Diagnosis Date  . Hypertension   . Myopia   . Anxiety state, unspecified   . Morbid obesity ([place])   . Other and unspecified hyperlipidemia   . Unspecified essential hypertension   . Osteopenia   . Umbilical hernia   . Palpitations   . Arthritis   . H/O: hysterectomy   . NSVT (nonsustained ventricular tachycardia) ([place])     a. 01/2005 Echo: EF 55-65%   Current Outpatient Prescriptions  Medication Sig Dispense Refill  . acetaminophen (TYLENOL) 500 MG tablet Take 1,000 mg by mouth every 6 (six) hours as needed for mild pain or moderate pain. Reported on 01/21/2015    . carvedilol (COREG) 25 MG tablet Take 1 tablet (25 mg total) by mouth 2 (two) times daily with a meal. 180 tablet 3  . furosemide (LASIX) 20 MG tablet Take 1 tablet (20 mg total) by mouth daily. 90 tablet 3  . GAVILYTE-N WITH FLAVOR PACK 420 g solution     . loratadine (CLARITIN) 10 MG tablet Take 1 tablet (10 mg total) by mouth daily as needed for allergies or rhinitis (allergies). 30 tablet 6  . lovastatin (MEVACOR) 20 MG tablet Take 1 tablet (20 mg total) by mouth at bedtime. 90 tablet 3  . spironolactone (ALDACTONE) 25 MG tablet Take 1 tablet (25 mg total) by mouth daily. 90 tablet 3  . verapamil (CALAN) 120 MG tablet TAKE ONE-HALF TABLET BY MOUTH THREE TIMES DAILY 90 tablet 3   No current facility-administered  medications for this visit.   Family History  Problem Relation Age of Onset  . Heart disease Mother   . Diabetes Mother   . Thyroid cancer Sister   . Diabetes Sister   . Hypertension Brother    Social History   Social History  . Marital Status: Married    Spouse Name: N/A  . Number of Children: N/A  . Years of Education: N/A   Occupational History  . Retired    Social History Main Topics  . Smoking status: Never Smoker   . Smokeless tobacco: Never Used  . Alcohol Use: No  . Drug Use: No  . Sexual Activity: Not on file   Other Topics Concern  . Not on file   Social History Narrative   ** Merged History Encounter **       Review of Systems: Review of Systems  Constitutional: Negative for fever.  HENT: Positive for congestion.   Eyes: Negative for blurred vision.  Respiratory: Positive for cough. Negative for shortness of breath.   Cardiovascular: Positive for palpitations. Negative for chest pain.  Gastrointestinal: Negative for abdominal pain.  Genitourinary: Negative for dysuria.  Musculoskeletal: Negative.   Skin: Negative for rash.  Neurological: Negative for dizziness and headaches.  Endo/Heme/Allergies: Positive for environmental allergies.  Psychiatric/Behavioral: The patient does not have insomnia.     Objective:  Physical Exam: Danley Danker  Vitals:   04/16/15 0953 04/16/15 0954  BP:  127/86  Height: 5\' 2"  (1.575 m)   Weight: 189 lb 3.2 oz (85.821 kg)   SpO2:  100%   GENERAL- alert, co-operative, NAD HEENT- Conjunctiva not injected, oral mucosa appears moist, no cervical LN enlargement. CARDIAC- RRR, no murmurs, rubs or gallops. RESP- CTAB, no wheezes or crackles. ABDOMEN- Soft, nontender, no guarding or rebound, normoactive bowel sounds present NEURO- No obvious Cr N abnormality, strength upper and lower extremities- 5/5, Sensation intact globally EXTREMITIES- pulse 2+, symmetric, trace pedal edema. SKIN- Small patchy hyperpigmented skin on lower  extremities PSYCH- Normal mood and affect, appropriate thought content and speech.  Assessment & Plan:

## 2015-04-21 ENCOUNTER — Telehealth: Payer: Self-pay

## 2015-04-21 DIAGNOSIS — J302 Other seasonal allergic rhinitis: Secondary | ICD-10-CM

## 2015-04-21 NOTE — Telephone Encounter (Signed)
rec'd request from pharmacy to change the loratadine to levocetirizine due to formulary/coverage Is this change appropriate?  If so can you update rx

## 2015-04-23 MED ORDER — LEVOCETIRIZINE DIHYDROCHLORIDE 2.5 MG/5ML PO SOLN: 2.5000 mg | Freq: Every evening | ORAL | Status: DC

## 2015-04-30 ENCOUNTER — Telehealth: Payer: Self-pay

## 2015-04-30 DIAGNOSIS — Z0000 Encounter for general adult medical examination without abnormal findings: Secondary | ICD-10-CM

## 2015-04-30 DIAGNOSIS — R1084 Generalized abdominal pain: Secondary | ICD-10-CM

## 2015-04-30 MED ORDER — ZOSTER VACCINE LIVE 19400 UNT/0.65ML ~~LOC~~ SOLR: 0.6500 mL | Freq: Once | SUBCUTANEOUS | Status: DC

## 2015-04-30 MED ORDER — SENNA 8.6 MG PO TABS: 2.0000 | ORAL_TABLET | Freq: Every evening | ORAL | Status: DC | PRN

## 2015-04-30 MED ORDER — POLYETHYLENE GLYCOL 3350 17 GM/SCOOP PO POWD: 17.0000 g | Freq: Every day | ORAL | Status: AC | PRN

## 2015-04-30 NOTE — Telephone Encounter (Signed)
Called Michelle Hurley back and discussed her medical issues. Zostavax prescription was re-sent to [place], previous order was not effectively placed.  Her abdominal bloating with Hx of abdominal surgeries may be only constipation vs motility problem. Recommended miralax 17g daily and senna 2 tablets QHS as PRN medications for constipation. If her bloating and poor appetite continue she may need referral for GI evaluation or imaging study for bowel motility.  I recommended she resume her physical activity as tolerated since adequate time has elapsed since surgery that there would be low risk of wound complications. She may also see some improvement in her GI motility problems with increasing activity.  [name], MD 04/30/2015, 5:36 PM

## 2015-04-30 NOTE — Telephone Encounter (Signed)
Please call pt back.

## 2015-04-30 NOTE — Telephone Encounter (Signed)
Spoke with patient  Shingles vaccine never got to pharmacy- Dr. Benjamine Mola will you send (looks like it never got ordered to me)  Pt wants to know if it is OK for her to participate in silver sneakers keeping in mind the surgery she had last year.  Pt also complaining of feeling really bloated, states she looks like shes gained 10 lb along her abdomen.  She is having bowel movements but nothing substantial.  She is inquiring if there is something that can be added (already taking metamucil) to aid her bowels.  She will be out of town next week but is open to an appointment to further discuss this if necessary.

## 2015-05-05 NOTE — Progress Notes (Signed)
Internal Medicine Clinic Attending  Case discussed with Dr. Rice at the time of the visit.  We reviewed the resident's history and exam and pertinent patient test results.  I agree with the assessment, diagnosis, and plan of care documented in the resident's note.  

## 2015-05-13 ENCOUNTER — Other Ambulatory Visit: Payer: Self-pay | Admitting: Internal Medicine

## 2015-05-19 ENCOUNTER — Telehealth: Payer: Self-pay | Admitting: Internal Medicine

## 2015-05-19 NOTE — Telephone Encounter (Signed)
Pt requesting Motion Patches for a Cruise she is taking

## 2015-05-23 MED ORDER — SCOPOLAMINE 1 MG/3DAYS TD PT72: 1.0000 | MEDICATED_PATCH | TRANSDERMAL | Status: DC | PRN

## 2015-05-23 MED ORDER — SCOPOLAMINE 1 MG/3DAYS TD PT72: 1.0000 | MEDICATED_PATCH | TRANSDERMAL | Status: DC

## 2015-05-23 NOTE — Telephone Encounter (Addendum)
Prescription sent for scopolamine patches for motion sickness for upcoming cruise. I assigned it to her listed [facility] pharmacy. Please let her know these are available to pick up.

## 2015-05-24 NOTE — Telephone Encounter (Signed)
Left voicemail to return call. 

## 2015-05-26 ENCOUNTER — Telehealth: Payer: Self-pay | Admitting: *Deleted

## 2015-05-26 NOTE — Telephone Encounter (Signed)
Patient returning call. Notified patient that her scopolamine patches were ready at [place]. Patient requesting this prescription to be filled through [facility] and was questioning where her shingles vaccine was sent and what she needed to do to get it. Contacted [employer] who requested verification from the patient before they would call [facility] to transfer the prescription. Contacted [facility] who verified that the shingles vaccine was ready for her and they would give it to here there. Charge for the vaccine is $3.70. Spoke with patient and let her know that she needed to contact [facility] and that she could go to [facility] at any time to receive her vaccine for $3.70. Verified Appointment 05/28/2015 at 10:45. Patient verbalized understanding.

## 2015-05-28 ENCOUNTER — Encounter: Payer: Self-pay | Admitting: Internal Medicine

## 2015-05-28 ENCOUNTER — Ambulatory Visit (INDEPENDENT_AMBULATORY_CARE_PROVIDER_SITE_OTHER): Payer: Commercial Managed Care - HMO | Admitting: Internal Medicine

## 2015-05-28 VITALS — BP 122/68 | HR 59 | Temp 98.0°F | Wt 191.3 lb

## 2015-05-28 DIAGNOSIS — I10 Essential (primary) hypertension: Secondary | ICD-10-CM

## 2015-05-28 NOTE — Assessment & Plan Note (Addendum)
Pt presents for a BP recheck today.  BP 122/68.  Asymptomatic. Reports not using much salt in her diet. She is trying to lose weight.  -cont current meds -DASH diet

## 2015-05-28 NOTE — Progress Notes (Signed)
Patient ID: HELA BRUEGGEMAN, female   DOB: 02-04-1950, 65 y.o.   MRN: [id number]     Subjective:   Patient ID: EUSTACIA KUBAS female    DOB: [birth year] 65 y.o.    MRN: [id number] Health Maintenance Due: Health Maintenance Due  Topic Date Due  . Hepatitis C Screening  06-Aug-1950  . TETANUS/TDAP  10/23/1967  . ZOSTAVAX  10/22/2008  . DEXA SCAN  10/22/2013    _________________________________________________  HPI: Ms.Jonte SORAH BLODGETT is a 65 y.o. female here for a BP recheck. Pt has a PMH outlined below.  Please see problem-based charting assessment and plan for further status of patient's chronic medical problems addressed at today's visit.  PMH: Past Medical History  Diagnosis Date  . Hypertension   . Myopia   . Anxiety state, unspecified   . Morbid obesity ([place])   . Other and unspecified hyperlipidemia   . Unspecified essential hypertension   . Osteopenia   . Umbilical hernia   . Palpitations   . Arthritis   . H/O: hysterectomy   . NSVT (nonsustained ventricular tachycardia) ([place])     a. 01/2005 Echo: EF 55-65%    Medications: Current Outpatient Prescriptions on File Prior to Visit  Medication Sig Dispense Refill  . acetaminophen (TYLENOL) 500 MG tablet Take 1,000 mg by mouth every 6 (six) hours as needed for mild pain or moderate pain. Reported on 01/21/2015    . carvedilol (COREG) 25 MG tablet Take 1 tablet (25 mg total) by mouth 2 (two) times daily with a meal. 180 tablet 3  . furosemide (LASIX) 20 MG tablet Take 1 tablet (20 mg total) by mouth daily. 90 tablet 3  . GAVILYTE-N WITH FLAVOR PACK 420 g solution     . levocetirizine (XYZAL) 2.5 MG/5ML solution Take 5 mLs (2.5 mg total) by mouth every evening. 148 mL 12  . lovastatin (MEVACOR) 20 MG tablet Take 1 tablet (20 mg total) by mouth at bedtime. 90 tablet 3  . polyethylene glycol powder (GLYCOLAX/MIRALAX) powder Take 17 g by mouth daily as needed for moderate constipation or severe constipation. 255 g 0  .  scopolamine (TRANSDERM-SCOP, 1.5 MG,) 1 MG/3DAYS Place 1 patch (1.5 mg total) onto the skin every 3 (three) days as needed. 4 patch 1  . senna (SENOKOT) 8.6 MG TABS tablet Take 2 tablets (17.2 mg total) by mouth at bedtime as needed for mild constipation. 120 each 0  . spironolactone (ALDACTONE) 25 MG tablet Take 1 tablet (25 mg total) by mouth daily. 90 tablet 3  . verapamil (CALAN) 120 MG tablet TAKE 1/2 TABLET THREE TIMES DAILY 135 tablet 3   No current facility-administered medications on file prior to visit.    Allergies: Allergies  Allergen Reactions  . Mushroom Extract Complex Anaphylaxis, Itching and Swelling    Swelling all over body   . Codeine Nausea And Vomiting  . Hydrocodone Nausea And Vomiting  . Codeine Nausea And Vomiting  . Prednisone Nausea And Vomiting    FH: Family History  Problem Relation Age of Onset  . Heart disease Mother   . Diabetes Mother   . Thyroid cancer Sister   . Diabetes Sister   . Hypertension Brother     SH: Social History   Social History  . Marital Status: Married    Spouse Name: N/A  . Number of Children: N/A  . Years of Education: N/A   Occupational History  . Retired    Social History Main Topics  .  Smoking status: Never Smoker   . Smokeless tobacco: Never Used  . Alcohol Use: No  . Drug Use: No  . Sexual Activity: Not Asked   Other Topics Concern  . None   Social History Narrative   ** Merged History Encounter **        Review of Systems: Constitutional: Negative for fever, chills.  Eyes: Negative for blurred vision.  Respiratory: Negative for cough and shortness of breath.  Cardiovascular: Negative for chest pain.  Gastrointestinal: Negative for nausea, vomiting. Neurological: Negative for dizziness.   Objective:   Vital Signs: Filed Vitals:   05/28/15 1058  BP: 122/68  Pulse: 59  Temp: 98 F (36.7 C)  TempSrc: Oral  Weight: 191 lb 4.8 oz (86.773 kg)  SpO2: 100%      BP Readings from Last 3  Encounters:  05/28/15 122/68  04/16/15 127/86  01/21/15 124/80    Physical Exam: Constitutional: Vital signs reviewed.  Patient is in NAD and cooperative with exam.  Head: Normocephalic and atraumatic. Eyes: EOMI, conjunctivae nl, no scleral icterus.  Neck: Supple. Cardiovascular: RRR, no MRG. Pulmonary/Chest: Normal effort, CTAB, no wheezes, rales, or rhonchi. Abdominal: Soft. NT/ND +BS. Neurological: A&O x3, cranial nerves II-XII are grossly intact, moving all extremities. Extremities: No LE edema. Skin: Warm, dry and intact. No rash.   Assessment & Plan:   Assessment and plan was discussed and formulated with my attending.

## 2015-05-28 NOTE — Patient Instructions (Signed)
Thank you for your visit today.   Please return to the internal medicine clinic in about 3-4 month(s) or sooner if needed.     I have made the following additions/changes to your medications:  Continue your current medications.    Please be sure to bring all of your medications with you to every visit; this includes herbal supplements, vitamins, eye drops, and any over-the-counter medications.   Should you have any questions regarding your medications and/or any new or worsening symptoms, please be sure to call the clinic at [phone].   If you believe that you are suffering from a life threatening condition or one that may result in the loss of limb or function, then you should call 911 and proceed to the nearest Emergency Department.   A healthy lifestyle and preventative care can promote health and wellness.   Maintain regular health, dental, and eye exams.  Eat a healthy diet. Foods like vegetables, fruits, whole grains, low-fat dairy products, and lean protein foods contain the nutrients you need without too many calories. Decrease your intake of foods high in solid fats, added sugars, and salt. Get information about a proper diet from your caregiver, if necessary.  Regular physical exercise is one of the most important things you can do for your health. Most adults should get at least 150 minutes of moderate-intensity exercise (any activity that increases your heart rate and causes you to sweat) each week. In addition, most adults need muscle-strengthening exercises on 2 or more days a week.   Maintain a healthy weight. The body mass index (BMI) is a screening tool to identify possible weight problems. It provides an estimate of body fat based on height and weight. Your caregiver can help determine your BMI, and can help you achieve or maintain a healthy weight. For adults 20 years and older:  A BMI below 18.5 is considered underweight.  A BMI of 18.5 to 24.9 is normal.  A  BMI of 25 to 29.9 is considered overweight.  A BMI of 30 and above is considered obese. DASH Eating Plan DASH stands for "Dietary Approaches to Stop Hypertension." The DASH eating plan is a healthy eating plan that has been shown to reduce high blood pressure (hypertension). Additional health benefits may include reducing the risk of type 2 diabetes mellitus, heart disease, and stroke. The DASH eating plan may also help with weight loss. WHAT DO I NEED TO KNOW ABOUT THE DASH EATING PLAN? For the DASH eating plan, you will follow these general guidelines:  Choose foods with a percent daily value for sodium of less than 5% (as listed on the food label).  Use salt-free seasonings or herbs instead of table salt or sea salt.  Check with your health care provider or pharmacist before using salt substitutes.  Eat lower-sodium products, often labeled as "lower sodium" or "no salt added."  Eat fresh foods.  Eat more vegetables, fruits, and low-fat dairy products.  Choose whole grains. Look for the word "whole" as the first word in the ingredient list.  Choose fish and skinless chicken or [place] more often than red meat. Limit fish, poultry, and meat to 6 oz (170 g) each day.  Limit sweets, desserts, sugars, and sugary drinks.  Choose heart-healthy fats.  Limit cheese to 1 oz (28 g) per day.  Eat more home-cooked food and less restaurant, buffet, and fast food.  Limit fried foods.  Cook foods using methods other than frying.  Limit canned vegetables. If you do  use them, rinse them well to decrease the sodium.  When eating at a restaurant, ask that your food be prepared with less salt, or no salt if possible. WHAT FOODS CAN I EAT? Seek help from a dietitian for individual calorie needs. Grains Whole grain or whole wheat bread. Brown rice. Whole grain or whole wheat pasta. Quinoa, bulgur, and whole grain cereals. Low-sodium cereals. Corn or whole wheat flour tortillas. Whole grain  cornbread. Whole grain crackers. Low-sodium crackers. Vegetables Fresh or frozen vegetables (raw, steamed, roasted, or grilled). Low-sodium or reduced-sodium tomato and vegetable juices. Low-sodium or reduced-sodium tomato sauce and paste. Low-sodium or reduced-sodium canned vegetables.  Fruits All fresh, canned (in natural juice), or frozen fruits. Meat and Other Protein Products Ground beef (85% or leaner), grass-fed beef, or beef trimmed of fat. Skinless chicken or [place]. Ground chicken or [place]. Pork trimmed of fat. All fish and seafood. Eggs. Dried beans, peas, or lentils. Unsalted nuts and seeds. Unsalted canned beans. Dairy Low-fat dairy products, such as skim or 1% milk, 2% or reduced-fat cheeses, low-fat ricotta or cottage cheese, or plain low-fat yogurt. Low-sodium or reduced-sodium cheeses. Fats and Oils Tub margarines without trans fats. Light or reduced-fat mayonnaise and salad dressings (reduced sodium). Avocado. Safflower, olive, or canola oils. Natural peanut or almond butter. Other Unsalted popcorn and pretzels. The items listed above may not be a complete list of recommended foods or beverages. Contact your dietitian for more options. WHAT FOODS ARE NOT RECOMMENDED? Grains White bread. White pasta. White rice. Refined cornbread. Bagels and croissants. Crackers that contain trans fat. Vegetables Creamed or fried vegetables. Vegetables in a cheese sauce. Regular canned vegetables. Regular canned tomato sauce and paste. Regular tomato and vegetable juices. Fruits Dried fruits. Canned fruit in light or heavy syrup. Fruit juice. Meat and Other Protein Products Fatty cuts of meat. Ribs, chicken wings, bacon, sausage, bologna, salami, chitterlings, fatback, hot dogs, bratwurst, and packaged luncheon meats. Salted nuts and seeds. Canned beans with salt. Dairy Whole or 2% milk, cream, half-and-half, and cream cheese. Whole-fat or sweetened yogurt. Full-fat cheeses or blue cheese.  Nondairy creamers and whipped toppings. Processed cheese, cheese spreads, or cheese curds. Condiments Onion and garlic salt, seasoned salt, table salt, and sea salt. Canned and packaged gravies. Worcestershire sauce. Tartar sauce. Barbecue sauce. Teriyaki sauce. Soy sauce, including reduced sodium. Steak sauce. Fish sauce. Oyster sauce. Cocktail sauce. Horseradish. Ketchup and mustard. Meat flavorings and tenderizers. Bouillon cubes. Hot sauce. Tabasco sauce. Marinades. Taco seasonings. Relishes. Fats and Oils Butter, stick margarine, lard, shortening, ghee, and bacon fat. Coconut, palm kernel, or palm oils. Regular salad dressings. Other Pickles and olives. Salted popcorn and pretzels. The items listed above may not be a complete list of foods and beverages to avoid. Contact your dietitian for more information. WHERE CAN I FIND MORE INFORMATION? National Heart, Lung, and Blood Institute: travelstabloid.com   This information is not intended to replace advice given to you by your health care provider. Make sure you discuss any questions you have with your health care provider.   Document Released: 12/15/2010 Document Revised: 01/16/2014 Document Reviewed: 10/30/2012 Elsevier Interactive Patient Education [employer].

## 2015-05-30 NOTE — Assessment & Plan Note (Signed)
Pt reports she has recently began participating in the silver sneakers program and is going to try and lose some weight.  -I encouraged her to participate in the silver sneakers program

## 2015-06-01 ENCOUNTER — Ambulatory Visit: Payer: Commercial Managed Care - HMO | Admitting: Internal Medicine

## 2015-06-01 ENCOUNTER — Encounter: Payer: Self-pay | Admitting: Internal Medicine

## 2015-06-01 ENCOUNTER — Ambulatory Visit (INDEPENDENT_AMBULATORY_CARE_PROVIDER_SITE_OTHER): Payer: Commercial Managed Care - HMO | Admitting: Internal Medicine

## 2015-06-01 DIAGNOSIS — J302 Other seasonal allergic rhinitis: Secondary | ICD-10-CM | POA: Diagnosis not present

## 2015-06-01 DIAGNOSIS — R05 Cough: Secondary | ICD-10-CM | POA: Diagnosis not present

## 2015-06-01 DIAGNOSIS — R0981 Nasal congestion: Principal | ICD-10-CM

## 2015-06-01 DIAGNOSIS — R058 Other specified cough: Secondary | ICD-10-CM | POA: Insufficient documentation

## 2015-06-01 HISTORY — DX: Nasal congestion: R09.81

## 2015-06-01 MED ORDER — FLUTICASONE PROPIONATE 50 MCG/ACT NA SUSP: 1.0000 | Freq: Every day | NASAL | Status: DC

## 2015-06-01 NOTE — Progress Notes (Signed)
   Subjective:    [name], female    DOB: 1950/04/18, 65 y.o.   MRN: [id number]  HPI Michelle Hurley is a 65 year old female with Morbid obesity, allergic rhinitis, history of TIA who presents today for cough. Please see assessment & plan for status of chronic medical problems.    Review of Systems  Constitutional: Negative for fever, chills and appetite change.  HENT: Positive for congestion, rhinorrhea and sore throat.   Eyes: Positive for itching.  Respiratory: Positive for cough. Negative for shortness of breath.        Objective:   Physical Exam  Constitutional: She appears well-developed and well-nourished.  HENT:  Head: Normocephalic and atraumatic.  Posterior oropharynx without erythema or exudates noted. Moist mucous membranes.  Eyes: Conjunctivae are normal. No scleral icterus.  Cardiovascular: Normal rate and regular rhythm.   Pulmonary/Chest: Effort normal and breath sounds normal. She has no wheezes.          Assessment & Plan:

## 2015-06-01 NOTE — Progress Notes (Signed)
A user error has taken place: encounter opened in error, closed for administrative reasons.

## 2015-06-01 NOTE — Patient Instructions (Addendum)
For the cough, STOP the alka-seltzer.   Try Flonase 1-2 sprays in your nostril per day. Try rinsing your sinuses using the [facility] as well.  For allergies, you can also try fexofenadine [Allegra] or cetirizine [Zyrtec].

## 2015-06-01 NOTE — Assessment & Plan Note (Signed)
Overview She notes she has seasonal allergies with predominantly histaminergic symptoms: Otorrhea, itchy eyes, rhinorrhea, congestion. She finds relief with loratadine 10 mg daily after trying both fexofenadine and cetirizine. She is not taking levocetirizine which is on her medications list as it costs her money and has never tried a nasal spray.  Assessment Seasonal allergic rhinitis with antihistamine therapy  Plan Prescribe fluticasone nasal spray 1-2 sprays per nostril daily as noted under "cough with congestion of paranasal sinus"

## 2015-06-01 NOTE — Assessment & Plan Note (Signed)
Overview Prior to her last office visit 05/28/11, she noted she had a sore throat which then evolved to a dry cough, headaches, generalized aches. She did take 2 capsules of Alka-Seltzer which contained acetaminophen 325 mg, dextromethorphan 10 mg, phenylephrine 5 mg and was able to sleep for the first time last night. Overall, she feels she is improving and denies any fevers or chills. She would like something for relief from her cough.  Assessment Cough likely in the setting of congestion and postnasal drip following viral pharyngitis.  Plan -Encouraged her to stop taking Alka-Seltzer -Prescribed fluticasone nasal spray with 1-2 sprays per nostril. Reviewed proper spray technique with her. -Recommended she ask the pharmacist about the [employer] as dryness of her sinuses can further worsen her headaches

## 2015-06-01 NOTE — Progress Notes (Deleted)
[name], female   DOB: [birth year], 65 y.o.   MRN: [id number]     Subjective:   [name] female    DOB: 1950/04/05 65 y.o.    MRN: [id number] Health Maintenance Due: Health Maintenance Due  Topic Date Due  . Hepatitis C Screening  October 16, 1950  . TETANUS/TDAP  10/23/1967  . ZOSTAVAX  10/22/2008  . DEXA SCAN  10/22/2013    _________________________________________________  HPI: [name] is a 65 y.o. female here for *** *** visit.  Pt has a PMH outlined below.  Please see problem-based charting assessment and plan for further status of patient's chronic medical problems addressed at today's visit.  PMH: Past Medical History  Diagnosis Date  . Hypertension   . Myopia   . Anxiety state, unspecified   . Morbid obesity ([place])   . Other and unspecified hyperlipidemia   . Unspecified essential hypertension   . Osteopenia   . Umbilical hernia   . Palpitations   . Arthritis   . H/O: hysterectomy   . NSVT (nonsustained ventricular tachycardia) ([place])     a. 01/2005 Echo: EF 55-65%    Medications: Current Outpatient Prescriptions on File Prior to Visit  Medication Sig Dispense Refill  . acetaminophen (TYLENOL) 500 MG tablet Take 1,000 mg by mouth every 6 (six) hours as needed for mild pain or moderate pain. Reported on 01/21/2015    . carvedilol (COREG) 25 MG tablet Take 1 tablet (25 mg total) by mouth 2 (two) times daily with a meal. 180 tablet 3  . furosemide (LASIX) 20 MG tablet Take 1 tablet (20 mg total) by mouth daily. 90 tablet 3  . GAVILYTE-N WITH FLAVOR PACK 420 g solution     . levocetirizine (XYZAL) 2.5 MG/5ML solution Take 5 mLs (2.5 mg total) by mouth every evening. 148 mL 12  . lovastatin (MEVACOR) 20 MG tablet Take 1 tablet (20 mg total) by mouth at bedtime. 90 tablet 3  . polyethylene glycol powder (GLYCOLAX/MIRALAX) powder Take 17 g by mouth daily as needed for moderate constipation or severe constipation. 255 g 0    . scopolamine (TRANSDERM-SCOP, 1.5 MG,) 1 MG/3DAYS Place 1 patch (1.5 mg total) onto the skin every 3 (three) days as needed. 4 patch 1  . senna (SENOKOT) 8.6 MG TABS tablet Take 2 tablets (17.2 mg total) by mouth at bedtime as needed for mild constipation. 120 each 0  . spironolactone (ALDACTONE) 25 MG tablet Take 1 tablet (25 mg total) by mouth daily. 90 tablet 3  . verapamil (CALAN) 120 MG tablet TAKE 1/2 TABLET THREE TIMES DAILY 135 tablet 3   No current facility-administered medications on file prior to visit.    Allergies: Allergies  Allergen Reactions  . Mushroom Extract Complex Anaphylaxis, Itching and Swelling    Swelling all over body   . Codeine Nausea And Vomiting  . Hydrocodone Nausea And Vomiting  . Codeine Nausea And Vomiting  . Prednisone Nausea And Vomiting    FH: Family History  Problem Relation Age of Onset  . Heart disease Mother   . Diabetes Mother   . Thyroid cancer Sister   . Diabetes Sister   . Hypertension Brother     SH: Social History   Social History  . Marital Status: Married    Spouse Name: N/A  . Number of Children: N/A  . Years of Education: N/A   Occupational History  . Retired    Social History Main Topics  .  Smoking status: Never Smoker   . Smokeless tobacco: Never Used  . Alcohol Use: No  . Drug Use: No  . Sexual Activity: Not on file   Other Topics Concern  . Not on file   Social History Narrative   ** Merged History Encounter **        Review of Systems: Constitutional: Negative for fever, chills.  Eyes: Negative for blurred vision.  Respiratory: Negative for cough and shortness of breath.  Cardiovascular: Negative for chest pain.  Gastrointestinal: Negative for nausea, vomiting. Neurological: Negative for dizziness.   Objective:   Vital Signs: There were no vitals filed for this visit.    BP Readings from Last 3 Encounters:  05/28/15 122/68  04/16/15 127/86  01/21/15 124/80    Physical  Exam: Constitutional: Vital signs reviewed.  Patient is in NAD and cooperative with exam.  Head: Normocephalic and atraumatic. Eyes: EOMI, conjunctivae nl, no scleral icterus.  Neck: Supple. Cardiovascular: RRR, no MRG. Pulmonary/Chest: normal effort, CTAB, no wheezes, rales, or rhonchi. Neurological: A&O x3, cranial nerves II-XII are grossly intact, moving all extremities. Extremities: 2+DP b/l; ***no LE edema. Skin: Warm, dry and intact. No rash.   Assessment & Plan:   Assessment and plan was discussed and formulated with my attending.

## 2015-06-01 NOTE — Progress Notes (Signed)
Internal Medicine Clinic Attending  Case discussed with Dr. Gill soon after the resident saw the patient.  We reviewed the resident's history and exam and pertinent patient test results.  I agree with the assessment, diagnosis, and plan of care documented in the resident's note.  

## 2015-06-02 NOTE — Progress Notes (Signed)
Internal Medicine Clinic Attending  Case discussed with Dr. Patel,Rushil at the time of the visit.  We reviewed the resident's history and exam and pertinent patient test results.  I agree with the assessment, diagnosis, and plan of care documented in the resident's note.  

## 2015-06-08 ENCOUNTER — Telehealth: Payer: Self-pay

## 2015-06-08 NOTE — Telephone Encounter (Signed)
Pt calls and states R side of face is sore, swollen and "stopped up", wants to know if you can call something in for her

## 2015-06-08 NOTE — Telephone Encounter (Signed)
Made appt, she will be in thurs, wed is 100% booked

## 2015-06-08 NOTE — Telephone Encounter (Signed)
Per my last note, she is already taking all the right medications. Let's have her come in tomorrow to be assessed.

## 2015-06-08 NOTE — Telephone Encounter (Signed)
Pt requesting the nurse to call back regarding sinus infection.

## 2015-06-10 ENCOUNTER — Encounter: Payer: Self-pay | Admitting: Internal Medicine

## 2015-06-10 ENCOUNTER — Ambulatory Visit (INDEPENDENT_AMBULATORY_CARE_PROVIDER_SITE_OTHER): Payer: Commercial Managed Care - HMO | Admitting: Internal Medicine

## 2015-06-10 VITALS — BP 138/88 | HR 66 | Temp 98.0°F | Ht 62.0 in | Wt 191.6 lb

## 2015-06-10 DIAGNOSIS — R05 Cough: Secondary | ICD-10-CM

## 2015-06-10 DIAGNOSIS — R0981 Nasal congestion: Secondary | ICD-10-CM | POA: Diagnosis not present

## 2015-06-10 DIAGNOSIS — R058 Other specified cough: Secondary | ICD-10-CM

## 2015-06-10 NOTE — Assessment & Plan Note (Signed)
Since seeing both Dr. Artis Delay and Dr. Posey Pronto over the past 1-2 weeks, her symptoms of sore throat, dry cough, headaches and generalized achiness have continued to improve. However, she started noticing right maxillary sinus swelling and fullness 2 days ago without other symptoms such as worsening unilateral rhinorrhea or subjective fevers or chills. She has tried to use the Flonase, but is unable to due to the uncomfortable sensation it gives her. She is also not able to use the Nettie pot due to the same issue. However, she is continuing to use the loratadine, which is helping her significantly. She has continued to refrain from using further Alka-Seltzer, as suggested by Dr. Posey Pronto. She said that while she had the swelling and fullness on her left face 2 days ago and called in to make an appointment, her symptoms have resolved over the past day with warm compresses over her cheek as well as a cotton ball in her ear to help relieve the sinus pressure, which has worked well for her. She feels like she is getting much better overall and does not feel like she wants to try any other medicines at this time. -Continue current symptomatic management -Did instruct patient that if she has recurrent facial pain and swelling that is unilateral or subjective fevers or chills, to call us, for which I can prescribe Augmentin twice a day for a short course if needed given her persistent symptoms -Did reassure patient that the symptoms should continue to improve over the next several days

## 2015-06-10 NOTE — Progress Notes (Signed)
Internal Medicine Clinic Attending  Case discussed with Dr. Kennedy at the time of the visit.  We reviewed the resident's history and exam and pertinent patient test results.  I agree with the assessment, diagnosis, and plan of care documented in the resident's note.  

## 2015-06-10 NOTE — Patient Instructions (Signed)
Mrs. Knepper,  I'm happy you're starting to feel better! Keep doing everything you're doing now to get better. If you start to feel worse again with the same sinus pressure or congestion on one side, call us and I can call in an antibiotic. I don't think this will be the case though, since it looks like your sinuses are running its course.  Thanks, Blane Ohara

## 2015-06-10 NOTE — Progress Notes (Signed)
   [name] female   DOB: Feb 16, 1950 65 y.o.   MRN: [id number]  Subjective:   HPI: [name] is a 65 y.o. with PMH of Allergic rhinosinusitis and history of TIA who presents to [facility] today for follow-up of her sinusitis symptoms.   Please see problem-based charting for status of medical issues pertinent to this visit.  Review of Systems: Pertinent items noted in HPI and remainder of comprehensive ROS otherwise negative.  Objective:  Physical Exam: Filed Vitals:   06/10/15 1324  BP: 138/88  Pulse: 66  Temp: 98 F (36.7 C)  TempSrc: Oral  Height: 5\' 2"  (1.575 m)  Weight: 191 lb 9.6 oz (86.909 kg)  SpO2: 100%   Gen: Well-appearing, alert and oriented to person, place, and time HEENT: Oropharynx clear without erythema or exudate. There is mild cobblestoning of the posterior oropharynx. No nasal mucosal hyperemia, normal appearing with scant clear mucus. No tenderness to palpation over the maxillary or frontal sinuses bilaterally. Neck: No cervical LAD, no thyromegaly or nodules, no JVD noted. CV: Normal rate, regular rhythm, no murmurs, rubs, or gallops Pulmonary: Normal effort, CTA bilaterally, no wheezing, rales, or rhonchi Abdominal: Soft, non-tender, non-distended, without rebound, guarding, or masses Extremities: Distal pulses 2+ in upper and lower extremities bilaterally, no tenderness, erythema or edema Skin: No atypical appearing moles. No rashes  Assessment & Plan:  Please see problem-based charting for assessment and plan.  Blane Ohara, MD Resident Physician, PGY-1 Department of Internal Medicine [facility]

## 2015-08-27 ENCOUNTER — Ambulatory Visit (INDEPENDENT_AMBULATORY_CARE_PROVIDER_SITE_OTHER): Payer: Commercial Managed Care - HMO | Admitting: Internal Medicine

## 2015-08-27 VITALS — BP 139/74 | HR 65 | Temp 98.0°F | Ht 62.0 in | Wt 193.1 lb

## 2015-08-27 DIAGNOSIS — R0981 Nasal congestion: Secondary | ICD-10-CM | POA: Diagnosis not present

## 2015-08-27 DIAGNOSIS — Z780 Asymptomatic menopausal state: Secondary | ICD-10-CM

## 2015-08-27 DIAGNOSIS — Z0000 Encounter for general adult medical examination without abnormal findings: Secondary | ICD-10-CM

## 2015-08-27 DIAGNOSIS — R05 Cough: Secondary | ICD-10-CM

## 2015-08-27 DIAGNOSIS — R0982 Postnasal drip: Secondary | ICD-10-CM

## 2015-08-27 DIAGNOSIS — I10 Essential (primary) hypertension: Secondary | ICD-10-CM | POA: Diagnosis not present

## 2015-08-27 DIAGNOSIS — J392 Other diseases of pharynx: Secondary | ICD-10-CM

## 2015-08-27 DIAGNOSIS — R058 Other specified cough: Secondary | ICD-10-CM

## 2015-08-27 NOTE — Patient Instructions (Signed)
It was a pleasure to see you today Michelle Hurley. I am very happy that your symptoms are improving and you feel well today. I will call a  referral over to have a new appointment for a bone density scan. Let's keep you as healthy as possible for the years to come.  We are checking your blood tests for routine monitoring today and will let you know if we find anything to recommend a change in treatment.  Please don't hesitate to call us if you have questions or any new problems.

## 2015-08-27 NOTE — Progress Notes (Signed)
   CC: Follow up for hypertension  HPI:  [name] is a 65 y.o. woman with past medical history detailed below presenting for a follow up of her hypertension and health maintenance. Her upper respiratory symptoms with rhinitis and pruritis are much improved now since her previous 2 visits. Since these symptoms improved she has not had any dizziness or palpitations. She has little to no leg swelling and feels well overall.  See problem based assessment and plan below for additional details.  Past Medical History:  Diagnosis Date  . Anxiety state, unspecified   . Arthritis   . H/O: hysterectomy   . Hypertension   . Morbid obesity ([place])   . Myopia   . NSVT (nonsustained ventricular tachycardia) ([place])    a. 01/2005 Echo: EF 55-65%  . Osteopenia   . Other and unspecified hyperlipidemia   . Palpitations   . Umbilical hernia   . Unspecified essential hypertension     Review of Systems:  Review of Systems  Eyes: Negative for blurred vision.  Respiratory: Negative for cough.   Cardiovascular: Negative for chest pain and leg swelling.  Gastrointestinal: Negative for diarrhea.  Genitourinary: Negative for dysuria.  Musculoskeletal: Negative for falls.  Skin: Negative for itching and rash.  Neurological: Negative for dizziness, weakness and headaches.  Endo/Heme/Allergies: Positive for environmental allergies.     Physical Exam:  Vitals:   08/27/15 1330  BP: 139/74  Pulse: 65  Temp: 98 F (36.7 C)  TempSrc: Oral  Weight: 193 lb 1.6 oz (87.6 kg)  Height: 5\' 2"  (1.575 m)   GENERAL- alert, co-operative, NAD HEENT- Moist oral mucosa, some dark patches on tongue, posterior oropharynx appears slightly erythematous CARDIAC- RRR, no murmurs, rubs or gallops. RESP- CTAB, no wheezes or crackles. NEURO- No obvious Cr N abnormality, strength upper and lower extremities- 5/5, Sensation intact globally EXTREMITIES- pulse 2+, symmetric, no pedal edema. SKIN- Warm, dry, No  rash or lesion. PSYCH- Normal mood and affect, appropriate thought content and speech.    Assessment & Plan:   See Encounters Tab for problem based charting.  Patient discussed with Dr. Lynnae January

## 2015-08-28 LAB — BMP8+ANION GAP
ANION GAP: 20 mmol/L — AB (ref 10.0–18.0)
BUN/Creatinine Ratio: 13 (ref 12–28)
BUN: 17 mg/dL (ref 8–27)
CO2: 23 mmol/L (ref 18–29)
CREATININE: 1.3 mg/dL — AB (ref 0.57–1.00)
Calcium: 9.9 mg/dL (ref 8.7–10.3)
Chloride: 98 mmol/L (ref 96–106)
GFR calc Af Amer: 49 mL/min/{1.73_m2} — ABNORMAL LOW (ref >59)
GFR, EST NON AFRICAN AMERICAN: 43 mL/min/{1.73_m2} — AB (ref >59)
Glucose: 97 mg/dL (ref 65–99)
Potassium: 4.5 mmol/L (ref 3.5–5.2)
Sodium: 141 mmol/L (ref 134–144)

## 2015-08-30 MED ORDER — ASPIRIN EC 81 MG PO TBEC: 81.0000 mg | DELAYED_RELEASE_TABLET | Freq: Every day | ORAL | Status: AC

## 2015-08-30 NOTE — Assessment & Plan Note (Signed)
Referral for bone densitometry today. She states th was ordered before but never got scheduled.

## 2015-08-30 NOTE — Progress Notes (Signed)
Internal Medicine Clinic Attending  Case discussed with Dr. Rice soon after the resident saw the patient.  We reviewed the resident's history and exam and pertinent patient test results.  I agree with the assessment, diagnosis, and plan of care documented in the resident's note. 

## 2015-08-30 NOTE — Assessment & Plan Note (Signed)
BP Readings from Last 3 Encounters:  08/27/15 139/74  06/10/15 138/88  05/28/15 122/68    Lab Results  Component Value Date   NA 141 08/27/2015   K 4.5 08/27/2015   CREATININE 1.30 (H) 08/27/2015    Assessment: Blood pressure control: Controlled Progress toward BP goal:  At goal Comments: She is doing well on Coreg 25mg  BID, lasix 40mg  Bmet checked today showing mild increase in serum creatinine compared to previous. She is no longer having any edema and possibly does not need this high a dose of diuretic.  Plan: Medications:  Continue Coreg, will recommend she try having current lasix dose to 20mg  and see if edema remains well managed Other plans: RTC with repeat labs at 3 months

## 2015-08-30 NOTE — Assessment & Plan Note (Signed)
Symptoms are now almost completely improved. She does still have some postnasal drip based on history and mild oropharyngeal erythema.  Continue loratadine daily

## 2015-10-01 ENCOUNTER — Encounter: Payer: Self-pay | Admitting: Internal Medicine

## 2015-10-01 ENCOUNTER — Ambulatory Visit (INDEPENDENT_AMBULATORY_CARE_PROVIDER_SITE_OTHER): Payer: Commercial Managed Care - HMO | Admitting: Internal Medicine

## 2015-10-01 VITALS — BP 136/73 | HR 65 | Temp 98.0°F | Ht 62.0 in | Wt 193.3 lb

## 2015-10-01 DIAGNOSIS — Z23 Encounter for immunization: Secondary | ICD-10-CM | POA: Diagnosis not present

## 2015-10-01 DIAGNOSIS — Z0000 Encounter for general adult medical examination without abnormal findings: Secondary | ICD-10-CM

## 2015-10-01 DIAGNOSIS — I700 Atherosclerosis of aorta: Secondary | ICD-10-CM

## 2015-10-01 DIAGNOSIS — R05 Cough: Secondary | ICD-10-CM

## 2015-10-01 DIAGNOSIS — R058 Other specified cough: Secondary | ICD-10-CM

## 2015-10-01 DIAGNOSIS — R0981 Nasal congestion: Principal | ICD-10-CM

## 2015-10-01 HISTORY — DX: Atherosclerosis of aorta: I70.0

## 2015-10-01 MED ORDER — LORATADINE 10 MG PO TABS: 10.0000 mg | ORAL_TABLET | Freq: Every day | ORAL | 2 refills | Status: DC

## 2015-10-01 NOTE — Patient Instructions (Signed)
It was a pleasure to see you today Ms. Queenan. I think your cough could be related to stopping the antihistamine (claritin) that you were taking. This leads to upper airway congestion with drainage irritating the throat.  I recommend restarting this medicine 10mg  once daily. If you are not seeing any improvement in your coughing within about 1-2 weeks from now you should call our clinic so they can let me know.

## 2015-10-01 NOTE — Progress Notes (Signed)
   CC: Cough  HPI:  Michelle Hurley is a 65 y.o. woman with a medical history detailed below presenting with a complaint of coughing for the past 2 weeks. She has been having a daily cough with clear, thick sputum that is worst when lying down flat on her back to sleep at night. She also has occasional runny nose. She denies any shortness of breath from this cough. She denies any fevers, headaches, hearing change, or sinus pressure. Of note she discontinued taking claritin 10mg  daily after her last office visit 1 month ago.  See problem based assessment and plan below for additional details.  Past Medical History:  Diagnosis Date  . Anxiety state, unspecified   . Arthritis   . H/O: hysterectomy   . Hypertension   . Morbid obesity ([place])   . Myopia   . NSVT (nonsustained ventricular tachycardia) ([place])    a. 01/2005 Echo: EF 55-65%  . Osteopenia   . Other and unspecified hyperlipidemia   . Palpitations   . Umbilical hernia   . Unspecified essential hypertension     Review of Systems:  Review of Systems  Constitutional: Negative for fever.  HENT: Positive for congestion. Negative for sore throat.   Eyes: Negative for blurred vision.  Respiratory: Positive for cough and sputum production. Negative for hemoptysis, shortness of breath and wheezing.   Cardiovascular: Negative for chest pain and leg swelling.  Gastrointestinal: Negative for heartburn.  Genitourinary: Negative for dysuria.  Musculoskeletal: Negative for myalgias.  Skin: Negative for rash.  Neurological: Negative for dizziness and headaches.  Endo/Heme/Allergies: Positive for environmental allergies.     Physical Exam:  Vitals:   10/01/15 1355  BP: 136/73  Pulse: 65  Temp: 98 F (36.7 C)  TempSrc: Oral  SpO2: 100%  Weight: 193 lb 4.8 oz (87.7 kg)  Height: 5\' 2"  (1.575 m)   GENERAL- alert, co-operative, NAD HEENT- Moist oral mucosa, some dark patches on tongue, posterior oropharynx appears  erythematous CARDIAC- RRR, no murmurs, rubs or gallops RESP- CTAB, no wheezes or crackles EXTREMITIES- symmetric, no pedal edema SKIN- Warm, dry, No rash or lesion PSYCH- Normal mood and affect, appropriate thought content and speech    Assessment & Plan:   See Encounters Tab for problem based charting.  Patient discussed with Dr. Angelia Mould

## 2015-10-04 ENCOUNTER — Other Ambulatory Visit: Payer: Self-pay | Admitting: Internal Medicine

## 2015-10-04 DIAGNOSIS — E7800 Pure hypercholesterolemia, unspecified: Secondary | ICD-10-CM

## 2015-10-04 NOTE — Assessment & Plan Note (Signed)
A: Michelle Hurley's cough is most consistent with upper airway cough syndrome at this time. It redeveloped within 1-2 weeks after discontinuing a daily antihistamine. There may be a component of acute inflammation as well but she did not experience overt URI symptoms. She is very adverse to taking any intranasal treatments or nasal sinus hygiene at this time.  P: Resume loratadine 10mg  daily Instructed to call back if not improved in 1-2 weeks

## 2015-10-04 NOTE — Assessment & Plan Note (Signed)
Flu shot given

## 2015-10-04 NOTE — Progress Notes (Signed)
Internal Medicine Clinic Attending  Case discussed with Dr. Rice at the time of the visit.  We reviewed the resident's history and exam and pertinent patient test results.  I agree with the assessment, diagnosis, and plan of care documented in the resident's note.  

## 2015-11-26 ENCOUNTER — Other Ambulatory Visit: Payer: Self-pay

## 2015-11-26 ENCOUNTER — Other Ambulatory Visit: Payer: Self-pay | Admitting: Internal Medicine

## 2015-11-26 DIAGNOSIS — E7800 Pure hypercholesterolemia, unspecified: Secondary | ICD-10-CM

## 2015-11-26 NOTE — Telephone Encounter (Signed)
Requesting spironolactone, furosemide, carvedilol, lovastatin and verapamil need to be filled @ [facility].

## 2015-11-27 MED ORDER — FUROSEMIDE 40 MG PO TABS: 40.0000 mg | ORAL_TABLET | Freq: Every day | ORAL | 0 refills | Status: DC

## 2015-11-27 MED ORDER — LOVASTATIN 20 MG PO TABS: 20.0000 mg | ORAL_TABLET | Freq: Every day | ORAL | 0 refills | Status: DC

## 2015-11-27 MED ORDER — SPIRONOLACTONE 25 MG PO TABS: 25.0000 mg | ORAL_TABLET | Freq: Every day | ORAL | 0 refills | Status: DC

## 2015-11-27 MED ORDER — CARVEDILOL 25 MG PO TABS: 25.0000 mg | ORAL_TABLET | Freq: Two times a day (BID) | ORAL | 0 refills | Status: DC

## 2015-11-27 MED ORDER — VERAPAMIL HCL 120 MG PO TABS: 60.0000 mg | ORAL_TABLET | Freq: Three times a day (TID) | ORAL | 0 refills | Status: DC

## 2015-12-01 ENCOUNTER — Other Ambulatory Visit: Payer: Self-pay | Admitting: *Deleted

## 2015-12-01 NOTE — Telephone Encounter (Signed)
Refills complete 

## 2016-01-21 ENCOUNTER — Encounter (HOSPITAL_COMMUNITY): Payer: Self-pay

## 2016-01-21 ENCOUNTER — Encounter: Payer: Self-pay | Admitting: Internal Medicine

## 2016-01-21 ENCOUNTER — Ambulatory Visit (HOSPITAL_COMMUNITY)
Admission: RE | Admit: 2016-01-21 | Discharge: 2016-01-21 | Disposition: A | Discharge status: Home / Self Care | Payer: Medicare HMO | Source: Ambulatory Visit | Attending: Internal Medicine | Admitting: Internal Medicine

## 2016-01-21 ENCOUNTER — Ambulatory Visit (INDEPENDENT_AMBULATORY_CARE_PROVIDER_SITE_OTHER): Payer: Medicare HMO | Admitting: Internal Medicine

## 2016-01-21 VITALS — BP 134/85 | HR 75 | Temp 98.2°F | Ht 62.0 in | Wt 195.2 lb

## 2016-01-21 DIAGNOSIS — I10 Essential (primary) hypertension: Secondary | ICD-10-CM | POA: Diagnosis not present

## 2016-01-21 DIAGNOSIS — J3489 Other specified disorders of nose and nasal sinuses: Secondary | ICD-10-CM | POA: Diagnosis not present

## 2016-01-21 DIAGNOSIS — R0981 Nasal congestion: Secondary | ICD-10-CM

## 2016-01-21 DIAGNOSIS — R05 Cough: Secondary | ICD-10-CM

## 2016-01-21 DIAGNOSIS — Z79899 Other long term (current) drug therapy: Secondary | ICD-10-CM | POA: Diagnosis not present

## 2016-01-21 DIAGNOSIS — Z0000 Encounter for general adult medical examination without abnormal findings: Secondary | ICD-10-CM

## 2016-01-21 DIAGNOSIS — J018 Other acute sinusitis: Secondary | ICD-10-CM | POA: Insufficient documentation

## 2016-01-21 MED ORDER — FLUTICASONE PROPIONATE 50 MCG/ACT NA SUSP: 2.0000 | Freq: Every day | NASAL | 2 refills | Status: DC

## 2016-01-21 NOTE — Progress Notes (Signed)
   CC: Cough, hypertension  HPI:  [name] is a 66 y.o. woman with a medical history detailed below who is here today for follow up of her chronic cough and her hypertension. She has been coughing on a daily basis occasionally productive of clear sputum since I saw her last summer now over 6 months. There is no associated shortness of breath or chest pain with this coughing. She does have sinus congestion for which she takes a daily antihistamine. She does not have any complaints of heartburn or indigestion. She is a never smoker  See problem based assessment and plan below for additional details  Past Medical History:  Diagnosis Date  . Anxiety state, unspecified   . Arthritis   . H/O: hysterectomy   . Hypertension   . Morbid obesity ([place])   . Myopia   . NSVT (nonsustained ventricular tachycardia) ([place])    a. 01/2005 Echo: EF 55-65%  . Osteopenia   . Other and unspecified hyperlipidemia   . Palpitations   . Umbilical hernia   . Unspecified essential hypertension     Review of Systems:  Review of Systems  Constitutional: Negative for fever.  HENT: Positive for congestion. Negative for sinus pain and sore throat.   Eyes: Negative for redness.  Respiratory: Positive for cough and sputum production. Negative for hemoptysis, shortness of breath and wheezing.   Gastrointestinal: Negative for abdominal pain and heartburn.  Skin: Negative for rash.  Neurological: Negative for dizziness.    Physical Exam: Physical Exam  Constitutional: She is well-developed, well-nourished, and in no distress.  HENT:  Head: Normocephalic and atraumatic.  Mouth/Throat: No oropharyngeal exudate.  Eyes: Conjunctivae are normal.  Cardiovascular: Normal rate and regular rhythm.   Pulmonary/Chest: Effort normal and breath sounds normal.  Lymphadenopathy:    She has no cervical adenopathy.  Neurological: She is alert.  Skin: Skin is warm and dry. No rash noted.  Psychiatric: Mood and  affect normal.    Vitals:   01/21/16 1430  BP: 134/85  Pulse: 75  Temp: 98.2 F (36.8 C)  TempSrc: Oral  SpO2: 100%  Weight: 195 lb 3.2 oz (88.5 kg)  Height: 5\' 2"  (1.575 m)    Assessment & Plan:   See Encounters Tab for problem based charting.  Patient discussed with Dr. Lynnae January

## 2016-01-21 NOTE — Patient Instructions (Signed)
It was a pleasure to see you today Michelle Hurley.  I am glad you are doing so well staying active and our blood pressure is perfect. I want to get to the bottom of this cough a bit more so I am sending you for an xray of the chest to make sure there is no problem in the lungs contributing to it. I will give you a call or leave a message about the xray next week. I also want you to try starting a nasal steroid spray with Flonase (fluticasone) 2 sprays per nostril per day while you have your symptoms. I think we can see you again in a few months if everything looks good otherwise I will ask you to come back sooner.  I will talk to [facility] about getting that bone density scan. You should get a call from [name] or from them.

## 2016-01-25 NOTE — Assessment & Plan Note (Signed)
A: Her symptoms are still most characteristic of an upper airway cough syndrome with the associated chronic rhinitis and congestion. She is a never smoker. However given the duration of cough to 6 months this warrants more thorough evaluation.  She does not have obvious symptoms of GERD but if symptoms do no improve will trial a course of PPI. She is agreeable to adding intranasal steroid for her congestion but was pretty resistant to sinus irrigation. We will check a chest xray to rule out structural abnormality although her exam is normal today

## 2016-01-25 NOTE — Assessment & Plan Note (Signed)
Her blood pressure is well controlled today at 134/85. She is only taking Coreg 25mg  BID and lasix 20mg  for her history of tachycardia and edema but is well controlled.  No change in medications today.

## 2016-01-25 NOTE — Progress Notes (Signed)
Internal Medicine Clinic Attending  Case discussed with Dr. Rice at the time of the visit.  We reviewed the resident's history and exam and pertinent patient test results.  I agree with the assessment, diagnosis, and plan of care documented in the resident's note.  

## 2016-01-25 NOTE — Assessment & Plan Note (Signed)
She has been referred for DEXA already last year but never set this up and the order has been standing. We will try to get this referral going again.

## 2016-01-31 ENCOUNTER — Other Ambulatory Visit: Payer: Self-pay | Admitting: Internal Medicine

## 2016-01-31 DIAGNOSIS — E7800 Pure hypercholesterolemia, unspecified: Secondary | ICD-10-CM

## 2016-02-01 ENCOUNTER — Telehealth: Payer: Self-pay

## 2016-02-01 NOTE — Telephone Encounter (Signed)
Requesting x-ray result. Please call back.  

## 2016-02-02 ENCOUNTER — Encounter: Payer: Self-pay | Admitting: Internal Medicine

## 2016-02-02 ENCOUNTER — Ambulatory Visit (INDEPENDENT_AMBULATORY_CARE_PROVIDER_SITE_OTHER): Payer: Medicare HMO | Admitting: Internal Medicine

## 2016-02-02 VITALS — BP 133/71 | HR 83 | Temp 98.2°F | Ht 65.0 in | Wt 196.3 lb

## 2016-02-02 DIAGNOSIS — R5383 Other fatigue: Secondary | ICD-10-CM

## 2016-02-02 DIAGNOSIS — J029 Acute pharyngitis, unspecified: Secondary | ICD-10-CM | POA: Diagnosis not present

## 2016-02-02 DIAGNOSIS — R69 Illness, unspecified: Principal | ICD-10-CM

## 2016-02-02 DIAGNOSIS — M791 Myalgia: Secondary | ICD-10-CM | POA: Diagnosis not present

## 2016-02-02 DIAGNOSIS — J111 Influenza due to unidentified influenza virus with other respiratory manifestations: Secondary | ICD-10-CM

## 2016-02-02 DIAGNOSIS — R05 Cough: Secondary | ICD-10-CM

## 2016-02-02 HISTORY — DX: Influenza due to unidentified influenza virus with other respiratory manifestations: J11.1

## 2016-02-02 MED ORDER — GUAIFENESIN-DM 100-10 MG/5ML PO SYRP: 5.0000 mL | ORAL_SOLUTION | ORAL | 0 refills | Status: DC | PRN

## 2016-02-02 MED ORDER — ACETAMINOPHEN 325 MG PO TABS: 650.0000 mg | ORAL_TABLET | Freq: Four times a day (QID) | ORAL | 0 refills | Status: DC | PRN

## 2016-02-02 MED ORDER — ONDANSETRON HCL 4 MG PO TABS: 4.0000 mg | ORAL_TABLET | Freq: Three times a day (TID) | ORAL | 0 refills | Status: DC | PRN

## 2016-02-02 NOTE — Telephone Encounter (Signed)
I discussed this recent result that was a negative chest xray with Michelle Hurley today by phone. She is also planning to attend clinic this afternoon due to worsening cold symptoms and I will see her at that time.

## 2016-02-02 NOTE — Patient Instructions (Signed)
It was a pleasure to see you today Michelle Hurley.  I am sorry to hear you are feeling poorly today. I think you have a viral infection, possibly the flu or another seasonal virus. This should typical run its course in a total of 7-10 days so you are in the midst of it.  For your body aches and fatigue I recommend taking tylenol 2 regular strength or 1 extra strength up to 4 times daily.  For cough I recommend trying dextromethorphan-guaifenesin (Robutussin). You can take this up to every 4 hours as needed but it can cause drowsiness so you may need to take a smaller dose.  If get nausea I recommend trying Zofran (ondansetron). If you do not get much nausea you don't need to take this medicine.

## 2016-02-02 NOTE — Progress Notes (Signed)
   CC: Cough, fatigue  HPI:  Michelle Hurley is a 66 y.o. woman I recently saw two weeks ago regarding chronic cough who is here today with worsening fatigue and body aches. She has a mild sore throat but overall is less congestion than a few weeks ago since she started Flonase. She has subjective fevers at home and extremely low energy. She does not have any nausea or diarrhea but feels her appetite is minimal. She is not taking any over the counter medications at this time.   See problem based assessment and plan below for additional details  Past Medical History:  Diagnosis Date  . Anxiety state, unspecified   . Arthritis   . H/O: hysterectomy   . Hypertension   . Morbid obesity ([place])   . Myopia   . NSVT (nonsustained ventricular tachycardia) ([place])    a. 01/2005 Echo: EF 55-65%  . Osteopenia   . Other and unspecified hyperlipidemia   . Palpitations   . Umbilical hernia   . Unspecified essential hypertension     Review of Systems:  Review of Systems  Constitutional: Positive for fever.  HENT: Negative for congestion.   Eyes: Negative for blurred vision.  Respiratory: Positive for cough.   Cardiovascular: Negative for chest pain.  Gastrointestinal: Negative for abdominal pain, diarrhea and nausea.  Genitourinary: Negative for dysuria.  Musculoskeletal: Positive for joint pain and myalgias. Negative for falls.  Skin: Negative for rash.  Neurological: Negative for dizziness.    Physical Exam: Physical Exam  Constitutional: She is well-developed, well-nourished, and in no distress.  HENT:  Head: Normocephalic and atraumatic.  Mouth/Throat: No oropharyngeal exudate.  TMs clear bilaterally, no sinus tenderness  Eyes: Conjunctivae are normal.  Cardiovascular: Normal rate and regular rhythm.   Pulmonary/Chest: Effort normal and breath sounds normal.  Abdominal: Soft. There is no tenderness.  Musculoskeletal: She exhibits no edema.  Lymphadenopathy:    She has no  cervical adenopathy.  Skin: Skin is warm and dry. No rash noted.    Vitals:   02/02/16 1417  BP: 133/71  Pulse: 83  Temp: 98.2 F (36.8 C)  TempSrc: Oral  SpO2: 100%  Weight: 196 lb 4.8 oz (89 kg)  Height: 5\' 5"  (1.651 m)    Assessment & Plan:   See Encounters Tab for problem based charting.  Patient discussed with Dr. Lynnae January

## 2016-02-02 NOTE — Telephone Encounter (Signed)
I will send request to her PCP.

## 2016-02-04 NOTE — Assessment & Plan Note (Signed)
Her presentation is suggestive of a viral illness. Influenza is possible although symptoms are mild. She is already taking treatment for upper airway symptoms and has little complaint of this. She has no obvious increased risk of a major complication.  -Recommend tylenol 650mg  q6hrs PRN for body aches -Guaifenesin-codeine PRN for cough if needed -Counseled her on maintaining hydration with low appetite and fevers -Recommend she contact us again if symptoms do not improve within about 10 days

## 2016-02-05 NOTE — Progress Notes (Signed)
Internal Medicine Clinic Attending  Case discussed with Dr. Rice at the time of the visit.  We reviewed the resident's history and exam and pertinent patient test results.  I agree with the assessment, diagnosis, and plan of care documented in the resident's note.  

## 2016-02-11 ENCOUNTER — Ambulatory Visit (HOSPITAL_COMMUNITY)
Admission: RE | Admit: 2016-02-11 | Discharge: 2016-02-11 | Disposition: A | Discharge status: Home / Self Care | Payer: Medicare HMO | Source: Ambulatory Visit | Attending: Internal Medicine | Admitting: Internal Medicine

## 2016-02-11 ENCOUNTER — Ambulatory Visit (INDEPENDENT_AMBULATORY_CARE_PROVIDER_SITE_OTHER): Payer: Medicare HMO | Admitting: Internal Medicine

## 2016-02-11 ENCOUNTER — Encounter: Payer: Self-pay | Admitting: Internal Medicine

## 2016-02-11 DIAGNOSIS — M25572 Pain in left ankle and joints of left foot: Secondary | ICD-10-CM | POA: Insufficient documentation

## 2016-02-11 DIAGNOSIS — M25472 Effusion, left ankle: Secondary | ICD-10-CM | POA: Diagnosis not present

## 2016-02-11 DIAGNOSIS — M25579 Pain in unspecified ankle and joints of unspecified foot: Secondary | ICD-10-CM | POA: Insufficient documentation

## 2016-02-11 NOTE — Patient Instructions (Addendum)
It was a pleasure to meet you today Ms. Verissimo,   If your ankle pain happens again, call the clinic and schedule an appointment for that same day.  Schedule a follow up appointment to be seen in the clinic in 3 months   Elastic Bandage and RICE WHAT DOES AN ELASTIC BANDAGE DO? Elastic bandages come in different shapes and sizes. They generally provide support to your injury and reduce swelling while you are healing, but they can perform different functions. Your health care provider will help you to decide what is best for your protection, recovery, or rehabilitation following an injury. WHAT ARE SOME GENERAL TIPS FOR USING AN ELASTIC BANDAGE?  Use the bandage as directed by the maker of the bandage that you are using.  Do not wrap the bandage too tightly. This may cut off the circulation in the arm or leg in the area below the bandage.  If part of your body beyond the bandage becomes blue, numb, cold, swollen, or is more painful, your bandage is most likely too tight. If this occurs, remove your bandage and reapply it more loosely.  See your health care provider if the bandage seems to be making your problems worse rather than better.  An elastic bandage should be removed and reapplied every 3-4 hours or as directed by your health care provider. WHAT IS RICE? The routine care of many injuries includes rest, ice, compression, and elevation (RICE therapy). Rest Rest is required to allow your body to heal. Generally, you can resume your routine activities when you are comfortable and have been given permission by your health care provider. Ice Icing your injury helps to keep the swelling down and it reduces pain. Do not apply ice directly to your skin.  Put ice in a plastic bag.  Place a towel between your skin and the bag.  Leave the ice on for 20 minutes, 2-3 times per day. Do this for as long as you are directed by your health care provider. Compression Compression helps to keep  swelling down, gives support, and helps with discomfort. Compression may be done with an elastic bandage. Elevation Elevation helps to reduce swelling and it decreases pain. If possible, your injured area should be placed at or above the level of your heart or the center of your chest. [place]? You should seek medical care if:  You have persistent pain and swelling.  Your symptoms are getting worse rather than improving. These symptoms may indicate that further evaluation or further X-rays are needed. Sometimes, X-rays may not show a small broken bone (fracture) until a number of days later. Make a follow-up appointment with your health care provider. Ask when your X-ray results will be ready. Make sure that you get your X-ray results. WHEN SHOULD I SEEK IMMEDIATE MEDICAL CARE? You should seek immediate medical care if:  You have a sudden onset of severe pain at or below the area of your injury.  You develop redness or increased swelling around your injury.  You have tingling or numbness at or below the area of your injury that does not improve after you remove the elastic bandage. This information is not intended to replace advice given to you by your health care provider. Make sure you discuss any questions you have with your health care provider. Document Released: 06/17/2001 Document Revised: 06/03/2015 Document Reviewed: 08/11/2013 Elsevier Interactive Patient Education  2017 [employer].

## 2016-02-11 NOTE — Progress Notes (Signed)
   CC: left ankle pain   HPI: [name] is a 66 y.o. with past medical history as outlined below who presents to clinic for follow up of left ankle pain. The pain has been off and on for the past 6 months. She has had 3 episodes of this pain in the past 6 months. She wakes up with 10/10 pain and is unable to walk comfortably on her ankle then it resolves. She feels the pain over her anterior ankle, from just above her ankle to her midfoot. Her ankle has not gotten swollen, erythematous, or warm to touch. She denies knee or hip pain. She has not had trauma to the area but does wear heals daily. The pain has resolved to 2/10 today, it usually last a couple of days then resolves on its own as it has this time.  She has had a similar pain in her right wrist where her wrist suddenly becomes stiff at any given time during the day. Her wrist becomes swollen and feels hot to touch. This is associated with numbness in various fingers.   Please see problem list for status of the pt's chronic medical problems.  Past Medical History:  Diagnosis Date  . Anxiety state, unspecified   . Arthritis   . H/O: hysterectomy   . Hypertension   . Morbid obesity ([place])   . Myopia   . NSVT (nonsustained ventricular tachycardia) ([place])    a. 01/2005 Echo: EF 55-65%  . Osteopenia   . Other and unspecified hyperlipidemia   . Palpitations   . Umbilical hernia   . Unspecified essential hypertension     Review of Systems:  Please see each problem below for a pertinent review of systems.  Physical Exam:  Vitals:   02/11/16 1336  BP: (!) 142/83  Pulse: 71  Temp: 98.2 F (36.8 C)  TempSrc: Oral  SpO2: 100%  Weight: 193 lb 11.2 oz (87.9 kg)  Height: 5\' 2"  (1.575 m)   Physical Exam  Constitutional: She is oriented to person, place, and time. She appears well-developed and well-nourished. No distress.  HENT:  Head: Normocephalic and atraumatic.  Eyes: Conjunctivae are normal. No scleral icterus.    Musculoskeletal: Normal range of motion. She exhibits no edema, tenderness or deformity.  Tenderness over left ankle, area anterior to lateral malleolus  No warmth, swelling, changes in range of motion in bilateral list.  Hands are non swollen, non tender, non erythematous.    Neurological: She is alert and oriented to person, place, and time.  Skin: Skin is warm and dry. She is not diaphoretic.   Assessment & Plan:   See Encounters Tab for problem based charting.  Left ankle pain  Tenderness on exam is concerning for anterior talofibular ligament injury. Description of the history of this ankle pain is concerning for gout. We have asked the patient to return to clinic on the day that this pain reoccurs for evaluation.  - ordered left ankle xray  - advised conservative management with RICE and wearing sneakers when at all possible  Patient seen with Dr. Angelia Mould

## 2016-02-13 NOTE — Assessment & Plan Note (Addendum)
The pain has been off and on for the past 6 months. She has had 3 episodes of this pain in the past 6 months. She wakes up with 10/10 pain and is unable to walk comfortably on her ankle then it resolves. She feels the pain over her anterior ankle, from just above her ankle to her midfoot. Her ankle has not gotten swollen, erythematous, or warm to touch. She denies knee or hip pain. She has not had trauma to the area but does wear heals daily. The pain has resolved to 2/10 today, it usually last a couple of days then resolves on its own as it has this time.   Tenderness on exam is concerning for anterior talofibular ligament injury. Description of the history of this ankle pain is concerning for gout. We have asked the patient to return to clinic on the day that this pain reoccurs for evaluation.  - ordered left ankle xray  - advised conservative management with RICE and wearing sneakers when at all possible  Addendum: Ankle Xray showed joint effusion without obvious fracture or changes related to arthritis. Called and spoke with patient, she says she has continued to have ankle pain. She has tried wearing smaller heals and keeping it elevated while she is sitting. Has not yet tried ace wrap or extensive resting of the ankle yet. Discussed that she may have a ligament tear which was not visible on xray. Advised that she should try to rest the ankle and wear sneakers for this next week and call us if pain continues or worsens.  She agreed with this plan.

## 2016-02-14 ENCOUNTER — Telehealth: Payer: Self-pay

## 2016-02-14 NOTE — Telephone Encounter (Signed)
Returned call to pt regarding left ankle xray results. Xray showed joint effusion without obvious fracture or changes related to arthritis. She says she has continued to have ankle pain. She has tried wearing smaller heals and keeping it elevated while she is sitting. Has not yet tried ace wrap or extensive resting of the ankle yet. Discussed that she may have a tendon tear which was not visible on xray. Advised that she should try to rest the ankle and wear sneakers for this next week and call us if pain continues or worsens. She agreed with this plan.

## 2016-02-14 NOTE — Telephone Encounter (Signed)
Requesting x-ray result. Please call back.  

## 2016-02-15 NOTE — Progress Notes (Signed)
Internal Medicine Clinic Attending  I saw and evaluated the patient.  I personally confirmed the key portions of the history and exam documented by Dr. Hetty Ely and I reviewed pertinent patient test results.  The assessment, diagnosis, and plan were formulated together and I agree with the documentation in the resident's note. Patient has pain with inversion and pinpoint tenderness over the anterior talo-fibular ligament.  She has had not trauma and does not remember an inversion injury however I suspect she has an ATF ligament sprain.  I agree with Dr Fredrik Cove conservative management.

## 2016-03-27 ENCOUNTER — Other Ambulatory Visit: Payer: Self-pay | Admitting: Internal Medicine

## 2016-04-03 ENCOUNTER — Other Ambulatory Visit: Payer: Self-pay | Admitting: Internal Medicine

## 2016-04-03 DIAGNOSIS — E7800 Pure hypercholesterolemia, unspecified: Secondary | ICD-10-CM

## 2016-04-21 ENCOUNTER — Encounter: Payer: Self-pay | Admitting: Internal Medicine

## 2016-04-21 ENCOUNTER — Ambulatory Visit (INDEPENDENT_AMBULATORY_CARE_PROVIDER_SITE_OTHER): Payer: Medicare HMO | Admitting: Internal Medicine

## 2016-04-21 VITALS — BP 133/77 | HR 69 | Temp 97.8°F | Ht 62.0 in | Wt 195.1 lb

## 2016-04-21 DIAGNOSIS — Z780 Asymptomatic menopausal state: Secondary | ICD-10-CM

## 2016-04-21 DIAGNOSIS — M25572 Pain in left ankle and joints of left foot: Secondary | ICD-10-CM

## 2016-04-21 DIAGNOSIS — I10 Essential (primary) hypertension: Secondary | ICD-10-CM

## 2016-04-21 DIAGNOSIS — Z0000 Encounter for general adult medical examination without abnormal findings: Secondary | ICD-10-CM

## 2016-04-21 DIAGNOSIS — J3089 Other allergic rhinitis: Secondary | ICD-10-CM

## 2016-04-21 DIAGNOSIS — Z79899 Other long term (current) drug therapy: Secondary | ICD-10-CM | POA: Diagnosis not present

## 2016-04-21 DIAGNOSIS — Z760 Encounter for issue of repeat prescription: Secondary | ICD-10-CM

## 2016-04-21 DIAGNOSIS — E2839 Other primary ovarian failure: Secondary | ICD-10-CM

## 2016-04-21 DIAGNOSIS — J309 Allergic rhinitis, unspecified: Secondary | ICD-10-CM | POA: Diagnosis not present

## 2016-04-21 MED ORDER — FLUTICASONE PROPIONATE 50 MCG/ACT NA SUSP: 2.0000 | Freq: Every day | NASAL | 2 refills | Status: DC

## 2016-04-21 MED ORDER — CETIRIZINE HCL 1 MG/ML PO SYRP: 10.0000 mg | ORAL_SOLUTION | Freq: Every day | ORAL | 12 refills | Status: DC

## 2016-04-21 NOTE — Progress Notes (Signed)
   CC: Allergy medication refills  HPI:  [name] is a 66 y.o. here today for refill of allergy medication and routine follow up including healthcare screening.   See problem based assessment and plan below for additional details  Past Medical History:  Diagnosis Date  . Anxiety state, unspecified   . Arthritis   . H/O: hysterectomy   . Hypertension   . Morbid obesity ([place])   . Myopia   . NSVT (nonsustained ventricular tachycardia) ([place])    a. 01/2005 Echo: EF 55-65%  . Osteopenia   . Other and unspecified hyperlipidemia   . Palpitations   . Umbilical hernia   . Unspecified essential hypertension     Review of Systems:  Review of Systems  Constitutional: Negative for malaise/fatigue.  HENT: Positive for congestion. Negative for ear pain, hearing loss, nosebleeds, sinus pain and sore throat.   Eyes: Negative for blurred vision and redness.  Respiratory: Positive for cough. Negative for shortness of breath.   Cardiovascular: Negative for leg swelling.  Gastrointestinal: Negative for nausea.  Skin: Negative for rash.    Physical Exam: Physical Exam  Constitutional: She is well-developed, well-nourished, and in no distress.  HENT:  Mouth/Throat: Oropharynx is clear and moist. No oropharyngeal exudate.  No sinus tenderness to palpation,  Eyes: Conjunctivae are normal.  Cardiovascular: Normal rate and regular rhythm.   Pulmonary/Chest: Effort normal and breath sounds normal.  Musculoskeletal: Normal range of motion. She exhibits no edema.  No ankle tenderness or swelling  Lymphadenopathy:    She has no cervical adenopathy.    Vitals:   04/21/16 1321  BP: 133/77  Pulse: 69  Temp: 97.8 F (36.6 C)  TempSrc: Oral  SpO2: 100%  Weight: 195 lb 1.6 oz (88.5 kg)  Height: 5\' 2"  (1.575 m)    Assessment & Plan:   See Encounters Tab for problem based charting.  Patient discussed with Dr. Angelia Mould

## 2016-04-21 NOTE — Patient Instructions (Signed)
It was a pleasure to see you today Michelle Hurley. I am glad you are feeling well overall today. I have sent new prescriptions for your allergy medications so hopefully that will get sent to you soon. If you do notice an increase in your ankle pain with swelling please call to make an appointment with [name] at that time so we could potentially check the swelling and treat the pain at that time. We will work to arrange osteoporosis screening again this year at the [facility] center.

## 2016-04-24 NOTE — Assessment & Plan Note (Signed)
HPI: She has increased symptoms with postnasal drip and congestion as the main complaints. There is no associated fever or fatigue. This is a recurring annual problem that worsened in April of last year as well. She has a good improvement with flonase and loratadine daily and wishes to continue these medications.  A: Perennial allergic rhinitis  P: -Reordered fluticasone daily -Reordered cetirizine 10mg  daily

## 2016-04-24 NOTE — Assessment & Plan Note (Signed)
Her left ankle pain is completely improved at this time. She has changed to wearing flatter footwear which she dislikes but thinks is beneficial for her ankle. I recommended she return to clinic while having symptoms if they return since joint aspiration might be beneficial in diagnosis.

## 2016-04-24 NOTE — Assessment & Plan Note (Signed)
HPI: Blood pressure today is 133/77. She has no complaints of increased leg edema in the past few months. She has no trouble with decreased exertional capacity or dizziness.  A: Well controlled hypertension today with goal <140/90 She has no lower extremity edema today on exam  P: Continue carvedilol 25mg  BID Continue spironolactone 25mg  daily Continue verapamil 60mg  TID Continue lasix 20mg  daily

## 2016-04-24 NOTE — Assessment & Plan Note (Signed)
Again discussed healthcare screening with DEXA scan today. We will order this also repeat mammography coming up within the next few months. She is agreeable to arranging for these screening tests.

## 2016-04-25 NOTE — Progress Notes (Signed)
Internal Medicine Clinic Attending  Case discussed with Dr. Rice at the time of the visit.  We reviewed the resident's history and exam and pertinent patient test results.  I agree with the assessment, diagnosis, and plan of care documented in the resident's note.  

## 2016-06-01 ENCOUNTER — Other Ambulatory Visit: Payer: Self-pay | Admitting: Internal Medicine

## 2016-06-12 ENCOUNTER — Ambulatory Visit (INDEPENDENT_AMBULATORY_CARE_PROVIDER_SITE_OTHER): Payer: Medicare HMO | Admitting: Internal Medicine

## 2016-06-12 ENCOUNTER — Encounter: Payer: Self-pay | Admitting: Internal Medicine

## 2016-06-12 VITALS — BP 134/78 | HR 72 | Temp 98.4°F | Ht 62.5 in | Wt 193.3 lb

## 2016-06-12 DIAGNOSIS — S99911A Unspecified injury of right ankle, initial encounter: Secondary | ICD-10-CM

## 2016-06-12 DIAGNOSIS — W19XXXA Unspecified fall, initial encounter: Secondary | ICD-10-CM | POA: Diagnosis not present

## 2016-06-12 DIAGNOSIS — M722 Plantar fascial fibromatosis: Secondary | ICD-10-CM | POA: Diagnosis not present

## 2016-06-12 DIAGNOSIS — Y92008 Other place in unspecified non-institutional (private) residence as the place of occurrence of the external cause: Secondary | ICD-10-CM | POA: Diagnosis not present

## 2016-06-12 DIAGNOSIS — S99811A Other specified injuries of right ankle, initial encounter: Secondary | ICD-10-CM | POA: Diagnosis not present

## 2016-06-12 HISTORY — DX: Unspecified injury of right ankle, initial encounter: S99.911A

## 2016-06-12 HISTORY — DX: Plantar fascial fibromatosis: M72.2

## 2016-06-12 MED ORDER — NAPROXEN 375 MG PO TABS: 375.0000 mg | ORAL_TABLET | Freq: Two times a day (BID) | ORAL | 0 refills | Status: DC

## 2016-06-12 NOTE — Progress Notes (Signed)
   CC: Fall with right ankle pain  HPI:  Michelle Hurley is a 66 y.o. woman here after falling with right ankle pain.   See problem based assessment and plan below for additional details  Past Medical History:  Diagnosis Date  . Anxiety state, unspecified   . Arthritis   . H/O: hysterectomy   . Hypertension   . Morbid obesity ([place])   . Myopia   . NSVT (nonsustained ventricular tachycardia) ([place])    a. 01/2005 Echo: EF 55-65%  . Osteopenia   . Other and unspecified hyperlipidemia   . Palpitations   . Umbilical hernia   . Unspecified essential hypertension     Review of Systems:  Review of Systems  Cardiovascular: Negative for leg swelling.  Musculoskeletal: Positive for falls and joint pain. Negative for back pain.  Neurological: Negative for dizziness and sensory change.  Endo/Heme/Allergies: Does not bruise/bleed easily.    Physical Exam: Physical Exam  Constitutional: She is well-developed, well-nourished, and in no distress.  Cardiovascular: Normal rate and regular rhythm.   Pulmonary/Chest: Effort normal and breath sounds normal.  Musculoskeletal:  Right ankle pain produced on forceful dorsiflexion and on inversion, minimal tenderness to direct palpation, no obvious swelling or deformity  Neurological: She has normal reflexes.    Vitals:   06/12/16 1534  BP: 134/78  Pulse: 72  Temp: 98.4 F (36.9 C)  TempSrc: Oral  SpO2: 100%  Weight: 193 lb 4.8 oz (87.7 kg)  Height: 5' 2.5" (1.588 m)    Assessment & Plan:   See Encounters Tab for problem based charting.  Patient discussed with Dr. Dareen Piano

## 2016-06-12 NOTE — Patient Instructions (Signed)
It was a pleasure to see you today Michelle Hurley.  I think you have a ankle ligament strain injury from falling. This should improve on its own but will probably continue to hurt for up to 2 weeks.  I recommend taking naproxen 375mg  twice daily WITH FOOD for a week or so while this recovers.  You can also wear a brace, elevate the ankle, or apply ice for up to 15 minutes at a time to relieve pain and swelling.

## 2016-06-14 NOTE — Assessment & Plan Note (Addendum)
HPI: She fell walking to the bathroom this morning but did not hit anything or sustain injury besides her ankle when catching herself. She feels some pain since this but is able to walk on her ankle. She owns ankle sleeves due to difficulty ACE wrapping with a left ankle injury on the left in January. There is no bruising or swelling associated with this ankle pain.  A: Ligamentous right ankle injury without subluxation or displacement  P: Continue ankle sleeve or wrap especially while walking Naproxen 375mg  BID PRN for 2 weeks or less Nadirah Socorro for symptoms Wear flat shoes during this time

## 2016-06-14 NOTE — Progress Notes (Signed)
Internal Medicine Clinic Attending  Case discussed with Dr. Rice at the time of the visit.  We reviewed the resident's history and exam and pertinent patient test results.  I agree with the assessment, diagnosis, and plan of care documented in the resident's note.  

## 2016-06-14 NOTE — Assessment & Plan Note (Signed)
She complains of foot pain worst stepping out of bed in the morning that improves during the day. This is described as worst at the base of the heel. She has been noticing this intermittently for weeks without any change in her behavior. Physical examination was unremarkable  I suspect she is suffering from some degree of plantar fasciitis given the location and timing of her symptoms. She also has a history of gout but this is not consistent due to a location that is atypical, symmetric, and improves with time of day.  I recommended basic stretching techniques for her today.

## 2016-08-07 ENCOUNTER — Other Ambulatory Visit: Payer: Self-pay | Admitting: Internal Medicine

## 2016-08-21 ENCOUNTER — Ambulatory Visit: Payer: Medicaid Other

## 2016-08-21 ENCOUNTER — Encounter: Payer: Self-pay | Admitting: Internal Medicine

## 2016-09-02 ENCOUNTER — Other Ambulatory Visit: Payer: Self-pay | Admitting: Internal Medicine

## 2016-09-02 DIAGNOSIS — E7800 Pure hypercholesterolemia, unspecified: Secondary | ICD-10-CM

## 2016-09-06 ENCOUNTER — Ambulatory Visit (INDEPENDENT_AMBULATORY_CARE_PROVIDER_SITE_OTHER): Payer: Medicare HMO | Admitting: Internal Medicine

## 2016-09-06 ENCOUNTER — Encounter: Payer: Self-pay | Admitting: Internal Medicine

## 2016-09-06 ENCOUNTER — Telehealth: Payer: Self-pay | Admitting: *Deleted

## 2016-09-06 VITALS — BP 133/84 | HR 63 | Temp 98.3°F | Ht 62.0 in | Wt 191.9 lb

## 2016-09-06 DIAGNOSIS — M79604 Pain in right leg: Secondary | ICD-10-CM | POA: Diagnosis not present

## 2016-09-06 DIAGNOSIS — Z79899 Other long term (current) drug therapy: Secondary | ICD-10-CM | POA: Diagnosis not present

## 2016-09-06 HISTORY — DX: Pain in right leg: M79.604

## 2016-09-06 MED ORDER — GABAPENTIN 300 MG PO CAPS: 300.0000 mg | ORAL_CAPSULE | Freq: Two times a day (BID) | ORAL | 0 refills | Status: DC

## 2016-09-06 NOTE — Telephone Encounter (Signed)
Refills are ordered. Thanks for setting her up in [facility]!

## 2016-09-06 NOTE — Assessment & Plan Note (Addendum)
Patient presenting with right, lateral leg pain that started 1-2 moths ago and has progressively worsened over the last 2 weeks. States it starts in her R ankle and radiates up to the R hip. Pain is dull in nature and was initially intermittent, but has been constant for the past 2 weeks. Has tried Ibuprofen a couple of times with minimal relief in pain. She has also been using an ankle brace she purchased for a previous ligament injury that has now resolved, but this is also not providing relief. States rubbing her leg helps some times. Has not been able to identify any aggravating factors. Reports this pain resolved spontaneously today, but is concerned that it may start again. Denies difficulty ambulating and pain does not worsen with ambulation. Denies back pain, recent trauma, injury, and falls. No RLE swelling or deformities noted on exam. No sensory or motor deficits noted.   Suspect patient's pain is neuropathic in nature given that is localized to lateral aspect of leg and radiates from ankle to hip. Her description of the pain is not consistent with sciatica and straight leg negative today. She reports a history of OA in both knees, but would expect pain localized to join and improvement with Ibuprofen. Low suspicion for PAD as patient is not endorsing symptoms consistent with claudication and has strong peripheral pulses on exam. Low suspicion for muscular etiology as she denied myalgias and and no motor deficits on exam. Meralgia paresthetica also in the differential, but this is usually localized to upper thigh and can present with sensory deficits over lateral aspect of thigh.   RLE pain: - Will do a trial of gabapentin 300mg  BID given high suspicion for neuropathy. Patient instructed to take medication only if she starts to experience pain again. She was educated on potential side effects.  - No imaging indicated at this time given low suspicion for MSK pain

## 2016-09-06 NOTE — Patient Instructions (Addendum)
It was a pleasure to meet you today, Ms. Charlestine Massed.  Please start taking gabapentin 300 mg. Take 1 tablet at night and 1 tablet in the morning for pain. This medication can cause drowsiness. Please start first dose at night. If he experienced no drowsiness take the 1 tablet in the morning.  Please call internal was in clinic if you have any trouble getting his medication.

## 2016-09-06 NOTE — Telephone Encounter (Signed)
Pt calls to check on her refills, please review those requests, dr rice.  She then states she needs to make an appt for the pain in her legs, states it is ongoing and has been keeping legs up and wrapped with little help appt offered for dr rice in October but she would like to be seen soon, appt given today at [address] [facility]

## 2016-09-06 NOTE — Progress Notes (Signed)
   CC: Right leg pain   HPI:  [name] is a 66 y.o. female with PMH listed below who presents to clinic with complaint of right leg pain. Please see problem based assessment and plan for further details.   Past Medical History:  Diagnosis Date  . Anxiety state, unspecified   . Arthritis   . H/O: hysterectomy   . Hypertension   . Morbid obesity ([place])   . Myopia   . NSVT (nonsustained ventricular tachycardia) ([place])    a. 01/2005 Echo: EF 55-65%  . Osteopenia   . Other and unspecified hyperlipidemia   . Palpitations   . Umbilical hernia   . Unspecified essential hypertension    Review of Systems:   Review of Systems  Constitutional: Negative for malaise/fatigue.  Musculoskeletal: Negative for back pain, falls, joint pain and myalgias.  Neurological: Negative for tingling, sensory change, speech change, focal weakness and weakness.    Physical Exam:  Vitals:   09/06/16 1404  BP: 133/84  Pulse: 63  Temp: 98.3 F (36.8 C)  TempSrc: Oral  SpO2: 100%  Weight: 191 lb 14.4 oz (87 kg)  Height: 5\' 2"  (1.575 m)   General: pleasant female, well-nourished, well-developed, in no acute distress  Neuro: Sensation intact in all four extremities, 5/5 strength on all four extremities, full range of motion on passive and active movement, straight leg test negative  Ext: warm and well perfused, no peripheral edema, 2+ DP pulses bilaterally  Derm: no rashes or lesions noted on RLE    Assessment & Plan:   See Encounters Tab for problem based charting.  Patient seen with Dr. Lynnae January

## 2016-09-08 NOTE — Progress Notes (Signed)
Internal Medicine Clinic Attending  I saw and evaluated the patient.  I personally confirmed the key portions of the history and exam documented by Dr. Santos-Sanchez and I reviewed pertinent patient test results.  The assessment, diagnosis, and plan were formulated together and I agree with the documentation in the resident's note. 

## 2016-10-09 ENCOUNTER — Other Ambulatory Visit: Payer: Self-pay | Admitting: Internal Medicine

## 2016-11-07 ENCOUNTER — Other Ambulatory Visit: Payer: Self-pay | Admitting: Internal Medicine

## 2016-11-07 DIAGNOSIS — Z1231 Encounter for screening mammogram for malignant neoplasm of breast: Secondary | ICD-10-CM

## 2016-11-08 ENCOUNTER — Other Ambulatory Visit: Payer: Self-pay | Admitting: Internal Medicine

## 2016-11-08 DIAGNOSIS — E7800 Pure hypercholesterolemia, unspecified: Secondary | ICD-10-CM

## 2016-11-13 NOTE — Addendum Note (Signed)
Addended by: Marcelino Duster on: 11/13/2016 11:05 AM   Modules accepted: Orders

## 2016-11-16 DIAGNOSIS — E2839 Other primary ovarian failure: Secondary | ICD-10-CM | POA: Insufficient documentation

## 2016-11-16 HISTORY — DX: Other primary ovarian failure: E28.39

## 2016-11-16 NOTE — Addendum Note (Signed)
Addended by: [name] on: 11/16/2016 05:28 PM   Modules accepted: Orders

## 2016-11-29 ENCOUNTER — Ambulatory Visit: Payer: Medicare HMO

## 2016-12-28 ENCOUNTER — Ambulatory Visit
Admission: RE | Admit: 2016-12-28 | Discharge: 2016-12-28 | Disposition: A | Discharge status: Home / Self Care | Payer: Medicaid Other | Source: Ambulatory Visit | Attending: Internal Medicine | Admitting: Internal Medicine

## 2016-12-28 DIAGNOSIS — Z780 Asymptomatic menopausal state: Secondary | ICD-10-CM | POA: Diagnosis not present

## 2016-12-28 DIAGNOSIS — Z1231 Encounter for screening mammogram for malignant neoplasm of breast: Secondary | ICD-10-CM | POA: Diagnosis not present

## 2016-12-28 DIAGNOSIS — E2839 Other primary ovarian failure: Secondary | ICD-10-CM

## 2016-12-28 DIAGNOSIS — Z13820 Encounter for screening for osteoporosis: Secondary | ICD-10-CM | POA: Diagnosis not present

## 2017-03-23 ENCOUNTER — Ambulatory Visit (INDEPENDENT_AMBULATORY_CARE_PROVIDER_SITE_OTHER): Payer: Medicare HMO | Admitting: Internal Medicine

## 2017-03-23 ENCOUNTER — Other Ambulatory Visit: Payer: Self-pay

## 2017-03-23 ENCOUNTER — Encounter: Payer: Self-pay | Admitting: Internal Medicine

## 2017-03-23 VITALS — BP 132/72 | HR 67 | Temp 98.3°F | Ht 62.0 in | Wt 194.3 lb

## 2017-03-23 DIAGNOSIS — R21 Rash and other nonspecific skin eruption: Secondary | ICD-10-CM | POA: Diagnosis not present

## 2017-03-23 DIAGNOSIS — L299 Pruritus, unspecified: Secondary | ICD-10-CM

## 2017-03-23 HISTORY — DX: Rash and other nonspecific skin eruption: R21

## 2017-03-23 MED ORDER — IVERMECTIN 3 MG PO TABS: ORAL_TABLET | ORAL | 0 refills | Status: DC

## 2017-03-23 NOTE — Assessment & Plan Note (Addendum)
This rash is overwhelmingly her biggest complaint today and all she wished to address. It is chronic enough that it is either an intrinsic process or an exposure that has not been removed. She underwent spraying for presumed bedbug extermination already making this very unlikely. Symptoms are more chronic and diffusely located than is typical for scabies but this remains possible. The lesions do not look like a fixed drug eruption nor urticarial wheals. Papular atopic dermatitis is possible and the linear lesion patterns could just be secondary excoriations. I will start empiric treatment with ivermectin for scabies infection since this could be a cause for nonresolving rash and itching. She will follow up in 2 weeks for improvement. If she has little or no benefit then scabies can be safely ruled out and I would consider skin biopsy and possible dermatology referral.

## 2017-03-23 NOTE — Progress Notes (Signed)
   CC: Itching all over  HPI:  [name] is a 67 y.o. female with PMHx detailed below presenting for chronic itching that has bothered her for over a year but has greatly increased in the past 2-3 months. She has been seen for this problem in the ED and was concerning for bedbugs but has subsequently had inspection and spraying for these without any bedbugs directly observed and without improvement in her symptoms. The itching is bothersome all over her body but is the worst on her arms.  See problem based assessment and plan below for additional details.  Rash This rash is overwhelmingly her biggest complaint today and all she wished to address. It is chronic enough that it is either an intrinsic process or an exposure that has not been removed. She underwent spraying for presumed bedbug extermination already making this very unlikely. Symptoms are more chronic and diffusely located than is typical for scabies but this remains possible. The lesions do not look like a fixed drug eruption nor urticarial wheals. Papular atopic dermatitis is possible and the linear lesion patterns could just be secondary excoriations. I will start empiric treatment with ivermectin for scabies infection since this could be a cause for nonresolving rash and itching. She will follow up in 2 weeks for improvement. If she has little or no benefit then scabies can be safely ruled out and I would consider skin biopsy and possible dermatology referral.   Past Medical History:  Diagnosis Date  . Anxiety state, unspecified   . Arthritis   . H/O: hysterectomy   . Hypertension   . Morbid obesity ([place])   . Myopia   . NSVT (nonsustained ventricular tachycardia) ([place])    a. 01/2005 Echo: EF 55-65%  . Osteopenia   . Other and unspecified hyperlipidemia   . Palpitations   . Umbilical hernia   . Unspecified essential hypertension     Review of Systems: Review of Systems  Constitutional: Negative for fever.    HENT: Negative for hearing loss.   Eyes: Negative for blurred vision.  Respiratory: Negative for shortness of breath.   Cardiovascular: Negative for leg swelling.  Gastrointestinal: Negative for nausea.  Genitourinary: Negative for dysuria.  Musculoskeletal: Negative for joint pain.  Skin: Positive for itching and rash.  Neurological: Negative for sensory change.  Endo/Heme/Allergies: Positive for environmental allergies.     Physical Exam: Vitals:   03/23/17 1538  BP: 132/72  Pulse: 67  Temp: 98.3 F (36.8 C)  TempSrc: Oral  SpO2: 100%  Weight: 194 lb 4.8 oz (88.1 kg)  Height: 5\' 2"  (1.575 m)   GENERAL- alert, co-operative, NAD HEENT- Posterior oropharynx is errythematous without lesions or exudate CARDIAC- RRR, no murmurs, rubs or gallops. RESP- CTAB, no wheezes or crackles. ABDOMEN- Soft, nontender BACK- Scattered excoriations mostly over upper back NEURO- Sensation intact globally EXTREMITIES- pulse 2+, symmetric, no pedal edema. SKIN- Excoriated rash present worst over extensor surfaces of both arms, some linear clusters of roofed and some unroofed papules, no obvious burrows or lesions in the digital webs PSYCH- Normal mood and affect, appropriate thought content and speech.        Assessment & Plan:   See encounters tab for problem based medical decision making.  Patient seen with Dr. Evette Doffing

## 2017-03-23 NOTE — Patient Instructions (Signed)
I suspect your skin rash might be due to scabies, which is inflammtion in reaction a skin parasite. Because it is all over we will try an oral medication. Take 6 tablets once then repeat in 7 days and this should be a complete treatment. You will also need to clean your linens particularly bedsheets in case mites are living on these surfaces.  We will see you again in 2 weeks to follow up for improvement of symptoms. If symptoms are note improved we will need to start further workup.

## 2017-03-26 ENCOUNTER — Other Ambulatory Visit: Payer: Self-pay | Admitting: Internal Medicine

## 2017-03-26 DIAGNOSIS — E7800 Pure hypercholesterolemia, unspecified: Secondary | ICD-10-CM

## 2017-03-26 NOTE — Progress Notes (Signed)
Internal Medicine Clinic Attending  I saw and evaluated the patient.  I personally confirmed the key portions of the history and exam documented by Dr. Rice and I reviewed pertinent patient test results.  The assessment, diagnosis, and plan were formulated together and I agree with the documentation in the resident's note.  

## 2017-03-27 NOTE — Telephone Encounter (Signed)
Spoke with patient. States she has plenty of medicine; thinks [employer] sent request automatically. Will keep f/u appt with PCP on 04/06/2017. Hubbard Hartshorn, RN, BSN

## 2017-03-27 NOTE — Telephone Encounter (Signed)
These were filled 11/1 for 6 months supply. Last BMP 08/2015. Since on spiron, I am not comfortable refilling, esp since should have enough med until appt in 10 days with Dr Benjamine Mola. Would you pls ensure she has enough med to last until appt and if she doesn't - why did she run out early?

## 2017-04-06 ENCOUNTER — Ambulatory Visit (INDEPENDENT_AMBULATORY_CARE_PROVIDER_SITE_OTHER): Payer: Medicare HMO | Admitting: Internal Medicine

## 2017-04-06 ENCOUNTER — Encounter: Payer: Self-pay | Admitting: Internal Medicine

## 2017-04-06 ENCOUNTER — Other Ambulatory Visit: Payer: Self-pay

## 2017-04-06 VITALS — BP 131/72 | HR 74 | Temp 98.5°F | Ht 62.0 in | Wt 194.8 lb

## 2017-04-06 DIAGNOSIS — R21 Rash and other nonspecific skin eruption: Secondary | ICD-10-CM

## 2017-04-06 DIAGNOSIS — I10 Essential (primary) hypertension: Secondary | ICD-10-CM | POA: Diagnosis not present

## 2017-04-06 DIAGNOSIS — Z0000 Encounter for general adult medical examination without abnormal findings: Secondary | ICD-10-CM

## 2017-04-06 DIAGNOSIS — Z23 Encounter for immunization: Secondary | ICD-10-CM | POA: Diagnosis not present

## 2017-04-06 DIAGNOSIS — L814 Other melanin hyperpigmentation: Secondary | ICD-10-CM

## 2017-04-06 MED ORDER — CLOBETASOL PROPIONATE 0.05 % EX CREA: 1.0000 "application " | TOPICAL_CREAM | Freq: Two times a day (BID) | CUTANEOUS | 0 refills | Status: DC

## 2017-04-06 NOTE — Progress Notes (Signed)
   CC: Follow up for skin rash  HPI:  [name] is a 67 y.o. female with PMHx detailed below presenting for 2 week follow up after a recent visit for pruritic skin rash. She took oral ivermectin in the past 2 weeks with complete resolution of her symptoms after the second dose. She now requests a treatment plan to resolve the remaining hyperpigmented spots on her skin at the healed lesions. Otherwise she feels in good health.  See problem based assessment and plan below for additional details.  Rash The rash is resolved today after treatment with oral ivermectin. Based on this scabies is the presumed cause. She reports thorough eradication measures of clothing and bedsheets. There are healing excoriations and some scattered dark <1cm skin lesions, even at sites healed a few months ago. I recommend trying topical clobetasol BID for the darkened spots If this fails to improve by a few weeks will refer to dermatology for evaluation  Essential hypertension Blood pressure remains well controlled today. Last Bmet documented in 2017. She has no peripheral edema, no headaches, no orthostasis. Repeat Bmet today without significant changes  Preventative health care Tdap vaccine administered today   Past Medical History:  Diagnosis Date  . Anxiety state, unspecified   . Arthritis   . H/O: hysterectomy   . Hypertension   . Morbid obesity ([place])   . Myopia   . NSVT (nonsustained ventricular tachycardia) ([place])    a. 01/2005 Echo: EF 55-65%  . Osteopenia   . Other and unspecified hyperlipidemia   . Palpitations   . Umbilical hernia   . Unspecified essential hypertension     Review of Systems: Review of Systems  Constitutional: Negative for fever.  HENT: Negative for congestion.   Eyes: Negative for blurred vision.  Respiratory: Negative for cough.   Cardiovascular: Negative for leg swelling.  Musculoskeletal: Negative for joint pain.  Skin: Positive for rash. Negative for  itching.  Endo/Heme/Allergies: Negative for environmental allergies.     Physical Exam: Vitals:   04/06/17 1529  BP: 131/72  Pulse: 74  Temp: 98.5 F (36.9 C)  TempSrc: Oral  SpO2: 99%  Weight: 194 lb 12.8 oz (88.4 kg)  Height: 5\' 2"  (1.575 m)   GENERAL- alert, co-operative, NAD HEENT- Atraumatic, PERRL, EOMI, oral mucosa appears moist, no cervical LN enlargement. CARDIAC- RRR, no murmurs, rubs or gallops. RESP- CTAB, no wheezes or crackles. EXTREMITIES- symmetric, no pedal edema. SKIN- scattered flat <1cm hyperpigmented spots on extensor surfaces on both arms PSYCH- Normal mood and affect, appropriate thought content and speech.    Assessment & Plan:   See encounters tab for problem based medical decision making.   Patient discussed with Dr. Beryle Beams

## 2017-04-06 NOTE — Patient Instructions (Addendum)
I am so glad you are feeling better after clearing up this rash. Let me know if it comes back though, as sometimes there can be mites that are missed and start causing the same problem again and we can treat if it does.  I will send a strong steroid cream to your local pharmacy that you can try applying to the dark areas except for your face. Twice a day and try this for a few weeks to see if it lightens these up. Otherwise I would refer you for a dermatology evaluation if it doesn't clear up.  We are checking blood work today and make sure everything looks good. Probably no medicine change is needed, I will call you if something is.  We gave you a TDaP booster shot today, this is good for years.  You can follow up in 3-6 months unless something else comes up.

## 2017-04-07 LAB — BMP8+ANION GAP
Anion Gap: 15 mmol/L (ref 10.0–18.0)
BUN/Creatinine Ratio: 14 (ref 12–28)
BUN: 15 mg/dL (ref 8–27)
CALCIUM: 9.4 mg/dL (ref 8.7–10.3)
CHLORIDE: 103 mmol/L (ref 96–106)
CO2: 23 mmol/L (ref 20–29)
Creatinine, Ser: 1.11 mg/dL — ABNORMAL HIGH (ref 0.57–1.00)
GFR calc non Af Amer: 51 mL/min/{1.73_m2} — ABNORMAL LOW (ref >59)
GFR, EST AFRICAN AMERICAN: 59 mL/min/{1.73_m2} — AB (ref >59)
GLUCOSE: 83 mg/dL (ref 65–99)
POTASSIUM: 4.2 mmol/L (ref 3.5–5.2)
Sodium: 141 mmol/L (ref 134–144)

## 2017-04-10 ENCOUNTER — Encounter: Payer: Self-pay | Admitting: Internal Medicine

## 2017-04-10 NOTE — Assessment & Plan Note (Signed)
Blood pressure remains well controlled today. Last Bmet documented in 2017. She has no peripheral edema, no headaches, no orthostasis. Repeat Bmet today without significant changes

## 2017-04-10 NOTE — Assessment & Plan Note (Signed)
- 

## 2017-04-10 NOTE — Assessment & Plan Note (Addendum)
The rash is resolved today after treatment with oral ivermectin. Based on this scabies is the presumed cause. She reports thorough eradication measures of clothing and bedsheets. There are healing excoriations and some scattered dark <1cm skin lesions, even at sites healed a few months ago. I recommend trying topical clobetasol BID for the darkened spots If this fails to improve by a few weeks will refer to dermatology for evaluation

## 2017-04-10 NOTE — Progress Notes (Signed)
Medicine attending: Medical history, presenting problems, physical findings, and medications, reviewed with resident physician Dr Christopher Rice on the day of the patient visit and I concur with his evaluation and management plan. 

## 2017-05-01 ENCOUNTER — Other Ambulatory Visit: Payer: Self-pay | Admitting: Internal Medicine

## 2017-05-01 DIAGNOSIS — E7800 Pure hypercholesterolemia, unspecified: Secondary | ICD-10-CM

## 2017-06-11 ENCOUNTER — Other Ambulatory Visit: Payer: Self-pay

## 2017-06-11 ENCOUNTER — Ambulatory Visit (INDEPENDENT_AMBULATORY_CARE_PROVIDER_SITE_OTHER): Payer: Medicare HMO | Admitting: Internal Medicine

## 2017-06-11 VITALS — BP 135/79 | HR 69 | Temp 98.3°F | Ht 62.0 in | Wt 192.4 lb

## 2017-06-11 DIAGNOSIS — M7701 Medial epicondylitis, right elbow: Secondary | ICD-10-CM

## 2017-06-11 DIAGNOSIS — M25521 Pain in right elbow: Secondary | ICD-10-CM | POA: Diagnosis not present

## 2017-06-11 HISTORY — DX: Pain in right elbow: M25.521

## 2017-06-11 MED ORDER — DICLOFENAC SODIUM 1 % TD GEL: 2.0000 g | Freq: Four times a day (QID) | TRANSDERMAL | 2 refills | Status: DC

## 2017-06-11 NOTE — Progress Notes (Signed)
Internal Medicine Clinic Attending  Case discussed with Dr. LaCroce at the time of the visit.  We reviewed the resident's history and exam and pertinent patient test results.  I agree with the assessment, diagnosis, and plan of care documented in the resident's note.  

## 2017-06-11 NOTE — Assessment & Plan Note (Signed)
Patient states yesterday around 6 pm she was reaching for a bottle of soda and suddenly had acute, sharp right elbow pain, located medially. She denies redness, warmth, or severe swelling of the joint. She states the pain is worse with elbow extension and improved with flexion. Range of motion intact. The pain impaired her sleep last night. She denies numbness, tingling, or weakness. She states this pain feels similar to other arthritis pain. She states the pain has already improved this morning.   On physical exam, the patient's ROM is intact (passive and active). She has no TTP around the elbow joint. No redness, warmth, or significant swelling. Mild swelling of right medial aspect of the elbow compared to right, but not significant. Patient likely has tendonitis or medial epicondylitis. Presentation does not seem consistent with bursitis.  Plan: -Voltaren gel QID to the elbow  -Tylenol PRN -RTC if symptoms worsen or fail to improve

## 2017-06-11 NOTE — Progress Notes (Signed)
   CC: right elbow pain   HPI:  [name] is a 67 y.o. female with past medical history as documented below presenting for right elbow pain. Please see encounter based charting for a more detailed description of the patient's acute medical problem.   Past Medical History:  Diagnosis Date  . Anxiety state, unspecified   . Arthritis   . H/O: hysterectomy   . Hypertension   . Morbid obesity ([place])   . Myopia   . NSVT (nonsustained ventricular tachycardia) ([place])    a. 01/2005 Echo: EF 55-65%  . Osteopenia   . Other and unspecified hyperlipidemia   . Palpitations   . Umbilical hernia   . Unspecified essential hypertension    Review of Systems:   Review of Systems  Constitutional: Negative for chills and fever.  Respiratory: Negative.   Cardiovascular: Negative.   Musculoskeletal: Positive for joint pain. Negative for myalgias.  Neurological: Negative for tingling, focal weakness and weakness.    Physical Exam:  Vitals:   06/11/17 1100  BP: 135/79  Pulse: 69  Temp: 98.3 F (36.8 C)  TempSrc: Oral  SpO2: 100%  Weight: 192 lb 6.4 oz (87.3 kg)  Height: 5\' 2"  (1.575 m)   Physical Exam  Constitutional: She is oriented to person, place, and time. She appears well-developed and well-nourished. No distress.  HENT:  Head: Normocephalic and atraumatic.  Eyes: Conjunctivae are normal. No scleral icterus.  Cardiovascular: Normal rate, regular rhythm and normal heart sounds.  Pulmonary/Chest: Effort normal and breath sounds normal.  Musculoskeletal:       Right elbow: She exhibits swelling (mild swelling of medial aspect of elbow joint in comparison to right medial elbow). She exhibits normal range of motion and no effusion. Tenderness found. Medial epicondyle tenderness noted.  Neurological: She is alert and oriented to person, place, and time. She has normal strength.  Skin: Skin is warm.  Psychiatric: She has a normal mood and affect.    Assessment & Plan:   See  Encounters Tab for problem based charting.  Patient discussed with Dr. Lynnae January

## 2017-06-11 NOTE — Patient Instructions (Signed)
Ms. Lapierre,   It was a pleasure meeting you today.   I have prescribed you Voltaren gel for you to apply to your elbow up to 4 times daily.    Please return to clinic if your elbow pain gets worse or you develop redness, swelling, or fever.

## 2017-07-03 ENCOUNTER — Other Ambulatory Visit: Payer: Self-pay | Admitting: Internal Medicine

## 2017-07-03 DIAGNOSIS — E7800 Pure hypercholesterolemia, unspecified: Secondary | ICD-10-CM

## 2017-07-05 ENCOUNTER — Other Ambulatory Visit: Payer: Self-pay

## 2017-07-05 ENCOUNTER — Ambulatory Visit (INDEPENDENT_AMBULATORY_CARE_PROVIDER_SITE_OTHER): Payer: Medicare HMO | Admitting: Internal Medicine

## 2017-07-05 ENCOUNTER — Encounter: Payer: Self-pay | Admitting: Internal Medicine

## 2017-07-05 ENCOUNTER — Telehealth: Payer: Self-pay | Admitting: Internal Medicine

## 2017-07-05 DIAGNOSIS — Z79899 Other long term (current) drug therapy: Secondary | ICD-10-CM

## 2017-07-05 DIAGNOSIS — R0981 Nasal congestion: Secondary | ICD-10-CM

## 2017-07-05 DIAGNOSIS — J302 Other seasonal allergic rhinitis: Secondary | ICD-10-CM

## 2017-07-05 DIAGNOSIS — R69 Illness, unspecified: Principal | ICD-10-CM

## 2017-07-05 DIAGNOSIS — R05 Cough: Secondary | ICD-10-CM | POA: Diagnosis not present

## 2017-07-05 DIAGNOSIS — J111 Influenza due to unidentified influenza virus with other respiratory manifestations: Secondary | ICD-10-CM

## 2017-07-05 MED ORDER — GUAIFENESIN-DM 100-10 MG/5ML PO SYRP: 5.0000 mL | ORAL_SOLUTION | ORAL | 0 refills | Status: DC | PRN

## 2017-07-05 MED ORDER — FLUTICASONE PROPIONATE 50 MCG/ACT NA SUSP: 2.0000 | Freq: Every day | NASAL | 0 refills | Status: DC

## 2017-07-05 NOTE — Telephone Encounter (Signed)
Patient notified that 2 Rxs sent to [facility] on [address]. Hubbard Hartshorn, RN, BSN

## 2017-07-05 NOTE — Patient Instructions (Signed)
Ms. Soliz,  I am sorry her cough is bothering you so much.  I am going to send in a cough medication for you I want to get you started back on the Flonase to see if that helps some with your inflammation and allergies.

## 2017-07-05 NOTE — Telephone Encounter (Signed)
Patient lost the paper physician just gave her and wanted to know if physician sent her presc to the pharmacy

## 2017-07-05 NOTE — Progress Notes (Signed)
   CC: cough  HPI:  [name] is a 67 y.o. female with a past medical history listed below here today with complaints of cough.  She reports that she recently traveled to the beach.  While at the beach she developed a cough and mild sinus congestion and rhinorrhea.  She was taking Claritin and started taking a over-the-counter cough syrup which did improve her symptoms.  However, her cough has now returned for the past 2 days.  She reports is interfering with her sleep and is worst at night but does occur throughout the day.  She reports bringing up yellow mucus initially but she is now bringing up clear/white mucus.  She does note a worsening of her sinus congestion as well but denies any pressure or pain.  She denies any fevers or chills.  No shortness of breath or chest pain.  She reports a history of seasonal allergies for which she currently only takes Claritin.  Past Medical History:  Diagnosis Date  . Anxiety state, unspecified   . Arthritis   . H/O: hysterectomy   . Hypertension   . Morbid obesity ([place])   . Myopia   . NSVT (nonsustained ventricular tachycardia) ([place])    a. 01/2005 Echo: EF 55-65%  . Osteopenia   . Other and unspecified hyperlipidemia   . Palpitations   . Umbilical hernia   . Unspecified essential hypertension    Review of Systems:   Negative except as noted in HPI  Physical Exam:  Vitals:   07/05/17 1504  BP: 137/85  Pulse: 78  Temp: 98.2 F (36.8 C)  TempSrc: Oral  SpO2: 100%  Weight: 192 lb 11.2 oz (87.4 kg)  Height: 5\' 3"  (1.6 m)   Physical Exam  Constitutional: She appears well-developed and well-nourished.  HENT:  Head: Normocephalic and atraumatic.  Nose: Nose normal.  Mouth/Throat: Oropharynx is clear and moist. No oropharyngeal exudate.  No sinus tenderness  Eyes: Right eye exhibits no discharge. Left eye exhibits no discharge.  Cardiovascular: Normal rate, regular rhythm and normal heart sounds.  Pulmonary/Chest: Effort  normal and breath sounds normal.     Assessment & Plan:   See Encounters Tab for problem based charting.  Patient discussed with Dr. Angelia Mould

## 2017-07-05 NOTE — Assessment & Plan Note (Signed)
See HPI for full details  Cough for less than 2 weeks.  She has history of allergic rhinitis that is likely contributing to her symptoms and may have been exacerbated by viral URI.  No overt signs of infection today.  Continue her Claritin and will prescribe her Flonase and send in a prescription for a cough syrup.

## 2017-07-09 NOTE — Progress Notes (Signed)
Internal Medicine Clinic Attending  Case discussed with Dr. Boswell at the time of the visit.  We reviewed the resident's history and exam and pertinent patient test results.  I agree with the assessment, diagnosis, and plan of care documented in the resident's note.  

## 2017-07-18 ENCOUNTER — Ambulatory Visit (INDEPENDENT_AMBULATORY_CARE_PROVIDER_SITE_OTHER): Payer: Medicare HMO | Admitting: Internal Medicine

## 2017-07-18 ENCOUNTER — Encounter: Payer: Self-pay | Admitting: Internal Medicine

## 2017-07-18 ENCOUNTER — Other Ambulatory Visit: Payer: Self-pay

## 2017-07-18 VITALS — BP 113/75 | HR 48 | Temp 98.5°F | Wt 192.7 lb

## 2017-07-18 DIAGNOSIS — J309 Allergic rhinitis, unspecified: Secondary | ICD-10-CM

## 2017-07-18 DIAGNOSIS — R05 Cough: Secondary | ICD-10-CM | POA: Diagnosis not present

## 2017-07-18 DIAGNOSIS — Z9114 Patient's other noncompliance with medication regimen: Secondary | ICD-10-CM

## 2017-07-18 DIAGNOSIS — R0981 Nasal congestion: Secondary | ICD-10-CM

## 2017-07-18 MED ORDER — OMEPRAZOLE 20 MG PO CPDR: 20.0000 mg | DELAYED_RELEASE_CAPSULE | Freq: Every day | ORAL | 0 refills | Status: DC

## 2017-07-18 NOTE — Progress Notes (Signed)
Medicine attending: I personally interviewed and briefly examined this patient on the day of the patient visit and reviewed pertinent clinical ,laboratory, and radiographic data  with resident physician Dr. Francesco Runner and we discussed a management plan.

## 2017-07-18 NOTE — Patient Instructions (Signed)
It was nice seeing you today. Thank you for choosing [facility] for your Primary Care.   Today we talked about:  1) Cough: See how it goes a few days without the air conditioning. You can buy a fan or humidifier. If you continue having issues with your cough, pick up the reflux medicine (omeprazole) from [facility] and take this medicine once a day for 4 weeks to see if your cough improves.  2) Congestion: Continue taking Claritin and Flonase daily. If congestion is very severe you can try over the counter phenylephrine for a few days.   FOLLOW-UP INSTRUCTIONS When: prn  Please contact the clinic if you have any problems, or need to be seen sooner.

## 2017-07-18 NOTE — Progress Notes (Signed)
   CC: cough  HPI:  [name] is a 67 y.o. female complaining of 6 weeks of cough with white phlegm, worse at night. Alleviating factors include turning of the air conditioning, going outside, robitussin, cough drops, sitting up. Laying flat makes it worse. Denies heartburn. No pets, never smoker.    Also has chronic issue with allergic rhinitis. Taking claritin. Non compliant with flonase.   Denies fever, chills, sob, chest pain, or recent travel.    Past Medical History:  Diagnosis Date  . Anxiety state, unspecified   . Arthritis   . H/O: hysterectomy   . Hypertension   . Morbid obesity ([place])   . Myopia   . NSVT (nonsustained ventricular tachycardia) ([place])    a. 01/2005 Echo: EF 55-65%  . Osteopenia   . Other and unspecified hyperlipidemia   . Palpitations   . Umbilical hernia   . Unspecified essential hypertension     Physical Exam:  Vitals:   07/18/17 1006  BP: 113/75  Pulse: (!) 48  Temp: 98.5 F (36.9 C)  TempSrc: Oral  SpO2: 100%  Weight: 192 lb 11.2 oz (87.4 kg)   Gen: Well appearing, NAD ENT: OP with erythema but no exudate. Nasal turbinates swollen and erythematous. Bilateral TMs white, transparent, and in neutral position.  Neck: No cervical LAD CV: RRR, no murmurs Pulm: Normal effort, CTA throughout, no wheezing     Assessment & Plan:   See Encounters Tab for problem based charting.  Patient seen with Dr. Beryle Beams

## 2017-07-18 NOTE — Assessment & Plan Note (Signed)
H/o of allergic rhinitis presenting with 6 weeks worsening productive cough. Currently taking claritin, robitussin, cough drops. Worse at night and with lying flat. Resolves when air conditioning is turned off. Not on ACEi. Never smoker. No fevers  Plan: - Advised patient to buy a fan and continue sleeping without air conditioning to see if this truly resolves her cough.  - If cough persists, recommend trial of omeprazole. This was sent to her pharmacy  - Resume flonase, continue Claritin

## 2017-08-01 ENCOUNTER — Encounter: Payer: Self-pay | Admitting: *Deleted

## 2017-08-13 ENCOUNTER — Other Ambulatory Visit: Payer: Self-pay | Admitting: Internal Medicine

## 2017-08-14 NOTE — Telephone Encounter (Signed)
Next appt scheduled 8/15 with PCP. 

## 2017-08-23 ENCOUNTER — Encounter: Payer: Self-pay | Admitting: Internal Medicine

## 2017-08-23 ENCOUNTER — Encounter: Payer: Medicare HMO | Admitting: Internal Medicine

## 2017-09-19 ENCOUNTER — Other Ambulatory Visit: Payer: Self-pay | Admitting: Internal Medicine

## 2017-09-19 DIAGNOSIS — E7800 Pure hypercholesterolemia, unspecified: Secondary | ICD-10-CM

## 2017-11-27 ENCOUNTER — Other Ambulatory Visit: Payer: Self-pay | Admitting: *Deleted

## 2017-11-27 DIAGNOSIS — E7800 Pure hypercholesterolemia, unspecified: Secondary | ICD-10-CM

## 2017-11-28 ENCOUNTER — Encounter: Payer: Self-pay | Admitting: *Deleted

## 2017-11-28 MED ORDER — CARVEDILOL 25 MG PO TABS: 25.0000 mg | ORAL_TABLET | Freq: Two times a day (BID) | ORAL | 1 refills | Status: DC

## 2017-11-28 MED ORDER — SPIRONOLACTONE 25 MG PO TABS: 25.0000 mg | ORAL_TABLET | Freq: Every day | ORAL | 1 refills | Status: DC

## 2017-11-28 NOTE — Telephone Encounter (Signed)
Duplicate Request..Marland KitchenA user error has taken place: encounter opened in error, closed for administrative reasons.Marland Kitchen

## 2017-11-29 ENCOUNTER — Other Ambulatory Visit: Payer: Self-pay | Admitting: *Deleted

## 2017-11-29 NOTE — Telephone Encounter (Signed)
Pt called about her refills. Stated she will call back in January to schedule an appt ( when Dr Nelia Shi schedule is available).

## 2017-11-30 MED ORDER — FUROSEMIDE 40 MG PO TABS: 40.0000 mg | ORAL_TABLET | Freq: Every day | ORAL | 1 refills | Status: DC

## 2018-01-15 ENCOUNTER — Encounter: Payer: Self-pay | Admitting: Internal Medicine

## 2018-01-15 ENCOUNTER — Ambulatory Visit (INDEPENDENT_AMBULATORY_CARE_PROVIDER_SITE_OTHER): Payer: Medicare HMO | Admitting: Internal Medicine

## 2018-01-15 DIAGNOSIS — M5431 Sciatica, right side: Secondary | ICD-10-CM

## 2018-01-15 DIAGNOSIS — M5442 Lumbago with sciatica, left side: Secondary | ICD-10-CM | POA: Diagnosis not present

## 2018-01-15 DIAGNOSIS — M5430 Sciatica, unspecified side: Secondary | ICD-10-CM | POA: Insufficient documentation

## 2018-01-15 DIAGNOSIS — M5432 Sciatica, left side: Secondary | ICD-10-CM

## 2018-01-15 DIAGNOSIS — M5441 Lumbago with sciatica, right side: Secondary | ICD-10-CM

## 2018-01-15 DIAGNOSIS — Z23 Encounter for immunization: Secondary | ICD-10-CM | POA: Diagnosis not present

## 2018-01-15 DIAGNOSIS — J00 Acute nasopharyngitis [common cold]: Secondary | ICD-10-CM | POA: Insufficient documentation

## 2018-01-15 DIAGNOSIS — J039 Acute tonsillitis, unspecified: Secondary | ICD-10-CM

## 2018-01-15 DIAGNOSIS — J069 Acute upper respiratory infection, unspecified: Secondary | ICD-10-CM

## 2018-01-15 HISTORY — DX: Sciatica, unspecified side: M54.30

## 2018-01-15 HISTORY — DX: Acute upper respiratory infection, unspecified: J06.9

## 2018-01-15 NOTE — Assessment & Plan Note (Signed)
Swollen lymph node: The patient endorsed a small painful palpable right-sided lump just below the right mandible that was first observed the evening of the fourth following a night out with her sisters.  She states that this was accompanied by mild pain with swallowing.  Her symptoms rapidly resolved the following 2 days and have remitted completely.  She notes exposure to sick contacts stating that she accompanied 3 of her grandchildren to the pediatrician's office where 1 was diagnosed with strep throat 2 days prior to development of her symptoms.  She denies fever, chills, nausea, vomiting, chest pain, throat pain, odynophagia, dysphagia, rhinorrhea, otitis, or conjunctivitis.  Plan: Likely URI with minimal symptoms.  Has resolved.  No current indication for treatment.  Patient given strict return precautions.

## 2018-01-15 NOTE — Progress Notes (Signed)
Internal Medicine Clinic Attending  Case discussed with Dr. Harbrecht at the time of the visit.  We reviewed the resident's history and exam and pertinent patient test results.  I agree with the assessment, diagnosis, and plan of care documented in the resident's note.   

## 2018-01-15 NOTE — Patient Instructions (Addendum)
FOLLOW-UP INSTRUCTIONS When: With Dr. Eileen Stanford in 5-6 months for a routine visit and standard screening  What to bring: All of your medications   I have not made any changes to your medications today.   Today we discussed the lump in your throat that appeared on Friday but resolved prior to this appointment that was associated with mild pain on swallowing. We also discussed the bilateral leg pain which has also improved greatly since it began prior to Thanksgiving. For both, I recommend observation given the improvement in symptoms. However, please do not hesitate to notify us if there are any issues or the swelling returns, worsens, you develop a fever, headache, can not swallow or a lump in your throat.  Thank you for your visit to the [facility] today. If you have any questions or concerns please call us at [phone].

## 2018-01-15 NOTE — Progress Notes (Signed)
   CC: Evaluation for a painful lump outside her throat  HPI:Michelle Hurley is a 68 y.o. female who presents for evaluation of a painful lump outside her throat. Please see individual problem based A/P for details.   Past Medical History:  Diagnosis Date  . Anxiety state, unspecified   . Arthritis   . H/O: hysterectomy   . Hypertension   . Morbid obesity ([place])   . Myopia   . NSVT (nonsustained ventricular tachycardia) ([place])    a. 01/2005 Echo: EF 55-65%  . Osteopenia   . Other and unspecified hyperlipidemia   . Palpitations   . Umbilical hernia   . Unspecified essential hypertension    Review of Systems:  ROS negative except as per HPI.  Physical Exam: Vitals:   01/15/18 0901  BP: 124/79  Pulse: 73  Temp: 97.8 F (36.6 C)  TempSrc: Oral  SpO2: 99%  Weight: 197 lb 11.2 oz (89.7 kg)   General: A/O x4, in no acute distress, afebrile, nondiaphoretic HEENT: Bilateral tympanic membranes intact, no visible fluid level, cone of light visible bilaterally but partially obstructed on the right by cerumen. Bilateral nasal passages mildly erythematous absent mucus, oropharynx partially visualized but absent lesions, erythema or plaques, slight enlargement of the hard pallet proximal to the upper right posterior teeth which appears chronic absent erythema, fluctuance or edema. No palpable lymphadenopathy of the postauricular, frontal, or supraclavicular areas.  MSK: Strength 5/5 bilaterally, patellar reflexes 2+ bilaterally, no pain of internal external rotation of the hips or with straight leg raise.   Assessment & Plan:   See Encounters Tab for problem based charting.  Patient discussed with Dr. Lynnae January

## 2018-01-15 NOTE — Assessment & Plan Note (Signed)
Sciatica: Patient described sharp pain radiating down from her lower back into her posterior legs, more so on the right than left. This began just prior to Thanksgiving this year and did not appear to be associated with any prior injury, heavy lifting, increased activity or activity changes. She denied loss of bowel or bladder control, numbness or tingling of the perianal area, muscle or leg weakness or spinal tenderness to palpation.  Her most recent Dexa scan demonstrated normal bone mineral density with her lowest T-score of -0.4 decreasing my concern for a nontraumatic vertebral fracture.    Plan:  Will monitor for now.

## 2018-01-22 ENCOUNTER — Inpatient Hospital Stay (HOSPITAL_COMMUNITY)
Admission: EM | Admit: 2018-01-22 | Discharge: 2018-01-27 | DRG: 123 | Disposition: A | Discharge status: Home / Self Care | Payer: Medicare HMO | Attending: Oncology | Admitting: Oncology

## 2018-01-22 ENCOUNTER — Encounter (HOSPITAL_COMMUNITY): Payer: Self-pay

## 2018-01-22 ENCOUNTER — Ambulatory Visit (INDEPENDENT_AMBULATORY_CARE_PROVIDER_SITE_OTHER): Payer: Medicare HMO | Admitting: Internal Medicine

## 2018-01-22 ENCOUNTER — Emergency Department (HOSPITAL_COMMUNITY): Payer: Medicare HMO

## 2018-01-22 ENCOUNTER — Other Ambulatory Visit: Payer: Self-pay

## 2018-01-22 VITALS — BP 112/47 | HR 80 | Temp 97.7°F | Ht 62.0 in | Wt 198.1 lb

## 2018-01-22 DIAGNOSIS — T380X5A Adverse effect of glucocorticoids and synthetic analogues, initial encounter: Secondary | ICD-10-CM | POA: Diagnosis present

## 2018-01-22 DIAGNOSIS — Z833 Family history of diabetes mellitus: Secondary | ICD-10-CM

## 2018-01-22 DIAGNOSIS — Z8249 Family history of ischemic heart disease and other diseases of the circulatory system: Secondary | ICD-10-CM

## 2018-01-22 DIAGNOSIS — Z7951 Long term (current) use of inhaled steroids: Secondary | ICD-10-CM | POA: Diagnosis not present

## 2018-01-22 DIAGNOSIS — E876 Hypokalemia: Secondary | ICD-10-CM | POA: Diagnosis not present

## 2018-01-22 DIAGNOSIS — N183 Chronic kidney disease, stage 3 (moderate): Secondary | ICD-10-CM | POA: Diagnosis not present

## 2018-01-22 DIAGNOSIS — Z823 Family history of stroke: Secondary | ICD-10-CM | POA: Diagnosis not present

## 2018-01-22 DIAGNOSIS — H53131 Sudden visual loss, right eye: Secondary | ICD-10-CM | POA: Diagnosis not present

## 2018-01-22 DIAGNOSIS — E785 Hyperlipidemia, unspecified: Secondary | ICD-10-CM | POA: Diagnosis present

## 2018-01-22 DIAGNOSIS — I129 Hypertensive chronic kidney disease with stage 1 through stage 4 chronic kidney disease, or unspecified chronic kidney disease: Secondary | ICD-10-CM | POA: Diagnosis not present

## 2018-01-22 DIAGNOSIS — H469 Unspecified optic neuritis: Principal | ICD-10-CM

## 2018-01-22 DIAGNOSIS — Z791 Long term (current) use of non-steroidal anti-inflammatories (NSAID): Secondary | ICD-10-CM

## 2018-01-22 DIAGNOSIS — I10 Essential (primary) hypertension: Secondary | ICD-10-CM | POA: Diagnosis not present

## 2018-01-22 DIAGNOSIS — R739 Hyperglycemia, unspecified: Secondary | ICD-10-CM | POA: Diagnosis present

## 2018-01-22 DIAGNOSIS — Z6836 Body mass index (BMI) 36.0-36.9, adult: Secondary | ICD-10-CM

## 2018-01-22 DIAGNOSIS — H5461 Unqualified visual loss, right eye, normal vision left eye: Secondary | ICD-10-CM

## 2018-01-22 DIAGNOSIS — Z885 Allergy status to narcotic agent status: Secondary | ICD-10-CM | POA: Diagnosis not present

## 2018-01-22 DIAGNOSIS — Z888 Allergy status to other drugs, medicaments and biological substances status: Secondary | ICD-10-CM | POA: Diagnosis not present

## 2018-01-22 DIAGNOSIS — H2513 Age-related nuclear cataract, bilateral: Secondary | ICD-10-CM | POA: Diagnosis not present

## 2018-01-22 DIAGNOSIS — Z79899 Other long term (current) drug therapy: Secondary | ICD-10-CM | POA: Diagnosis not present

## 2018-01-22 DIAGNOSIS — Z91018 Allergy to other foods: Secondary | ICD-10-CM | POA: Diagnosis not present

## 2018-01-22 DIAGNOSIS — F411 Generalized anxiety disorder: Secondary | ICD-10-CM | POA: Diagnosis present

## 2018-01-22 DIAGNOSIS — M159 Polyosteoarthritis, unspecified: Secondary | ICD-10-CM | POA: Diagnosis not present

## 2018-01-22 DIAGNOSIS — M8580 Other specified disorders of bone density and structure, unspecified site: Secondary | ICD-10-CM | POA: Diagnosis present

## 2018-01-22 DIAGNOSIS — I619 Nontraumatic intracerebral hemorrhage, unspecified: Secondary | ICD-10-CM | POA: Diagnosis not present

## 2018-01-22 DIAGNOSIS — Z90710 Acquired absence of both cervix and uterus: Secondary | ICD-10-CM | POA: Diagnosis not present

## 2018-01-22 DIAGNOSIS — H547 Unspecified visual loss: Secondary | ICD-10-CM

## 2018-01-22 DIAGNOSIS — H43822 Vitreomacular adhesion, left eye: Secondary | ICD-10-CM | POA: Diagnosis not present

## 2018-01-22 DIAGNOSIS — H26033 Infantile and juvenile nuclear cataract, bilateral: Secondary | ICD-10-CM | POA: Diagnosis not present

## 2018-01-22 DIAGNOSIS — H5340 Unspecified visual field defects: Secondary | ICD-10-CM

## 2018-01-22 DIAGNOSIS — E669 Obesity, unspecified: Secondary | ICD-10-CM | POA: Diagnosis not present

## 2018-01-22 DIAGNOSIS — R51 Headache: Secondary | ICD-10-CM | POA: Diagnosis not present

## 2018-01-22 DIAGNOSIS — F419 Anxiety disorder, unspecified: Secondary | ICD-10-CM | POA: Diagnosis not present

## 2018-01-22 DIAGNOSIS — G360 Neuromyelitis optica [Devic]: Secondary | ICD-10-CM | POA: Diagnosis present

## 2018-01-22 DIAGNOSIS — K5900 Constipation, unspecified: Secondary | ICD-10-CM | POA: Diagnosis not present

## 2018-01-22 DIAGNOSIS — H539 Unspecified visual disturbance: Secondary | ICD-10-CM | POA: Diagnosis not present

## 2018-01-22 HISTORY — DX: Neuromyelitis optica (devic): G36.0

## 2018-01-22 HISTORY — DX: Unspecified optic neuritis: H46.9

## 2018-01-22 LAB — BASIC METABOLIC PANEL
Anion gap: 9 (ref 5–15)
BUN: 15 mg/dL (ref 8–23)
CHLORIDE: 106 mmol/L (ref 98–111)
CO2: 26 mmol/L (ref 22–32)
Calcium: 9.2 mg/dL (ref 8.9–10.3)
Creatinine, Ser: 1.23 mg/dL — ABNORMAL HIGH (ref 0.44–1.00)
GFR, EST AFRICAN AMERICAN: 52 mL/min — AB (ref >60)
GFR, EST NON AFRICAN AMERICAN: 45 mL/min — AB (ref >60)
Glucose, Bld: 93 mg/dL (ref 70–99)
POTASSIUM: 3.9 mmol/L (ref 3.5–5.1)
SODIUM: 141 mmol/L (ref 135–145)

## 2018-01-22 LAB — CBC
HCT: 42 % (ref 36.0–46.0)
Hemoglobin: 13.4 g/dL (ref 12.0–15.0)
MCH: 27.2 pg (ref 26.0–34.0)
MCHC: 31.9 g/dL (ref 30.0–36.0)
MCV: 85.2 fL (ref 80.0–100.0)
NRBC: 0 % (ref 0.0–0.2)
PLATELETS: 253 10*3/uL (ref 150–400)
RBC: 4.93 MIL/uL (ref 3.87–5.11)
RDW: 14.6 % (ref 11.5–15.5)
WBC: 4.4 10*3/uL (ref 4.0–10.5)

## 2018-01-22 LAB — C-REACTIVE PROTEIN: CRP: <0.8 mg/dL (ref <1.0)

## 2018-01-22 LAB — SEDIMENTATION RATE: Sed Rate: 22 mm/hr (ref 0–22)

## 2018-01-22 MED ORDER — ACETAMINOPHEN 325 MG PO TABS
650.0000 mg | ORAL_TABLET | Freq: Four times a day (QID) | ORAL | Status: DC | PRN
  Administered 2018-01-23 – 2018-01-25 (×2): 650 mg via ORAL
  Filled 2018-01-22 (×2): qty 2

## 2018-01-22 MED ORDER — PROMETHAZINE HCL 25 MG PO TABS: 12.5000 mg | ORAL_TABLET | Freq: Four times a day (QID) | ORAL | Status: DC | PRN

## 2018-01-22 MED ORDER — VERAPAMIL HCL 120 MG PO TABS
60.0000 mg | ORAL_TABLET | Freq: Three times a day (TID) | ORAL | Status: DC
  Administered 2018-01-23 – 2018-01-27 (×14): 60 mg via ORAL
  Filled 2018-01-22 (×16): qty 0.5

## 2018-01-22 MED ORDER — ACETAMINOPHEN 650 MG RE SUPP: 650.0000 mg | Freq: Four times a day (QID) | RECTAL | Status: DC | PRN

## 2018-01-22 MED ORDER — ENOXAPARIN SODIUM 40 MG/0.4ML ~~LOC~~ SOLN
40.0000 mg | SUBCUTANEOUS | Status: DC
  Administered 2018-01-23 – 2018-01-27 (×5): 40 mg via SUBCUTANEOUS
  Filled 2018-01-22 (×5): qty 0.4

## 2018-01-22 MED ORDER — CARVEDILOL 12.5 MG PO TABS
25.0000 mg | ORAL_TABLET | Freq: Two times a day (BID) | ORAL | Status: DC
  Administered 2018-01-23 – 2018-01-27 (×9): 25 mg via ORAL
  Filled 2018-01-22 (×9): qty 2

## 2018-01-22 MED ORDER — SPIRONOLACTONE 25 MG PO TABS
25.0000 mg | ORAL_TABLET | Freq: Every day | ORAL | Status: DC
  Administered 2018-01-23 – 2018-01-27 (×5): 25 mg via ORAL
  Filled 2018-01-22 (×5): qty 1

## 2018-01-22 MED ORDER — GABAPENTIN 300 MG PO CAPS: 300.0000 mg | ORAL_CAPSULE | Freq: Two times a day (BID) | ORAL | Status: DC

## 2018-01-22 MED ORDER — FLUTICASONE PROPIONATE 50 MCG/ACT NA SUSP
2.0000 | Freq: Every day | NASAL | Status: DC
  Filled 2018-01-22: qty 16

## 2018-01-22 MED ORDER — SENNOSIDES-DOCUSATE SODIUM 8.6-50 MG PO TABS
1.0000 | ORAL_TABLET | Freq: Every evening | ORAL | Status: DC | PRN
  Administered 2018-01-25 (×2): 1 via ORAL
  Filled 2018-01-22 (×2): qty 1

## 2018-01-22 MED ORDER — PRAVASTATIN SODIUM 10 MG PO TABS
20.0000 mg | ORAL_TABLET | Freq: Every day | ORAL | Status: DC
  Administered 2018-01-23 – 2018-01-26 (×4): 20 mg via ORAL
  Filled 2018-01-22 (×4): qty 2

## 2018-01-22 MED ORDER — GADOBUTROL 1 MMOL/ML IV SOLN
9.0000 mL | Freq: Once | INTRAVENOUS | Status: AC | PRN
  Administered 2018-01-22: 9 mL via INTRAVENOUS

## 2018-01-22 NOTE — Consult Note (Addendum)
Neurology Consultation  Reason for Consult: Right eye vision loss, optic neuritis Referring Physician: Dr. Donzetta Kohut, PA-C  CC: Right eye visual loss  History is obtained from: Patient, chart, ophthalmology note from Dr. Carolynn Sayers  HPI: Michelle Hurley is a 68 y.o. female past medical history of hypertension, obesity, anxiety, presented to the emergency room after seeing her ophthalmologist who sent her for an emergent blood work and MRI for right eye altitudinal vision loss. She said that about a week ago she started noticing some pain and discomfort in her right eye and then started losing vision in the right eye which she describes as being unable to see the top half of her visual field on the right.  No problems with the left eye. This progressed to the point where superior altitudinal half from the right eye looks like looking through a very dark black translucent medium. She went to her primary care, and was sent for a same-day visit with ophthalmology.  No gross abnormalities noted on the exam and concern for retrobulbar optic neuritis due to painful vision loss, was sent to the ER for further imaging and lab work. She received an MRI of the brain that showed a right optic nerve enhancement with contrast. She denies any temporal tenderness.  Denies any jaw claudication.  Denies any preceding infection or illnesses. She got a flu shot the day before the symptoms started but got no other side effects from the flu shot.  LKW: 7 days ago tpa given?: no, OSW Premorbid modified Rankin scale (mRS): 0  ROS: ROS was performed and is negative except as noted in the HPI.   Past Medical History:  Diagnosis Date  . Anxiety state, unspecified   . Arthritis   . H/O: hysterectomy   . Hypertension   . Morbid obesity ([place])   . Myopia   . NSVT (nonsustained ventricular tachycardia) ([place])    a. 01/2005 Echo: EF 55-65%  . Osteopenia   . Other and unspecified hyperlipidemia   .  Palpitations   . Umbilical hernia   . Unspecified essential hypertension    Family History  Problem Relation Age of Onset  . Heart disease Mother   . Diabetes Mother   . Thyroid cancer Sister   . Diabetes Sister   . Hypertension Brother   . Breast cancer Neg Hx    Social History:   reports that she has never smoked. She has never used smokeless tobacco. She reports that she does not drink alcohol or use drugs.  Medications No current facility-administered medications for this encounter.   Current Outpatient Medications:  .  acetaminophen (TYLENOL) 325 MG tablet, Take 2 tablets (650 mg total) by mouth every 6 (six) hours as needed for moderate pain, fever or headache., Disp: 100 tablet, Rfl: 0 .  carvedilol (COREG) 25 MG tablet, Take 1 tablet (25 mg total) by mouth 2 (two) times daily with a meal., Disp: 180 tablet, Rfl: 1 .  cetirizine (ZYRTEC) 1 MG/ML syrup, Take 10 mLs (10 mg total) by mouth daily., Disp: 236 mL, Rfl: 12 .  clobetasol cream (TEMOVATE) 5.18 %, Apply 1 application topically 2 (two) times daily., Disp: 30 g, Rfl: 0 .  diclofenac sodium (VOLTAREN) 1 % GEL, Apply 2 g topically 4 (four) times daily., Disp: 1 Tube, Rfl: 2 .  fluticasone (FLONASE) 50 MCG/ACT nasal spray, Place 2 sprays into both nostrils daily., Disp: 16 g, Rfl: 2 .  fluticasone (FLONASE) 50 MCG/ACT nasal spray, Place 2 sprays  into both nostrils daily., Disp: 1 g, Rfl: 0 .  furosemide (LASIX) 40 MG tablet, Take 1 tablet (40 mg total) by mouth daily., Disp: 90 tablet, Rfl: 1 .  gabapentin (NEURONTIN) 300 MG capsule, Take 1 capsule (300 mg total) by mouth 2 (two) times daily. Take 1 tablet in the morning and 1 tablet at night., Disp: 60 capsule, Rfl: 0 .  GAVILYTE-N WITH FLAVOR PACK 420 g solution, , Disp: , Rfl:  .  guaiFENesin-dextromethorphan (ROBITUSSIN DM) 100-10 MG/5ML syrup, Take 5 mLs by mouth every 4 (four) hours as needed for cough., Disp: 118 mL, Rfl: 0 .  ivermectin (STROMECTOL) 3 MG TABS tablet,  Take 6 tablets by mouth once then repeat once in 7 days., Disp: 12 tablet, Rfl: 0 .  loratadine (CLARITIN) 10 MG tablet, Take 1 tablet (10 mg total) by mouth daily., Disp: 30 tablet, Rfl: 2 .  lovastatin (MEVACOR) 20 MG tablet, TAKE 1 TABLET AT BEDTIME, Disp: 90 tablet, Rfl: 1 .  naproxen (NAPROSYN) 375 MG tablet, Take 1 tablet (375 mg total) by mouth 2 (two) times daily with a meal., Disp: 20 tablet, Rfl: 0 .  omeprazole (PRILOSEC) 20 MG capsule, Take 1 capsule (20 mg total) by mouth daily for 28 days., Disp: 28 capsule, Rfl: 0 .  ondansetron (ZOFRAN) 4 MG tablet, Take 1 tablet (4 mg total) by mouth every 8 (eight) hours as needed for nausea or vomiting., Disp: 20 tablet, Rfl: 0 .  polyethylene glycol powder (GLYCOLAX/MIRALAX) powder, Take 17 g by mouth daily as needed for moderate constipation or severe constipation., Disp: 255 g, Rfl: 0 .  scopolamine (TRANSDERM-SCOP, 1.5 MG,) 1 MG/3DAYS, Place 1 patch (1.5 mg total) onto the skin every 3 (three) days as needed., Disp: 4 patch, Rfl: 1 .  senna (SENOKOT) 8.6 MG TABS tablet, Take 2 tablets (17.2 mg total) by mouth at bedtime as needed for mild constipation., Disp: 120 each, Rfl: 0 .  spironolactone (ALDACTONE) 25 MG tablet, Take 1 tablet (25 mg total) by mouth daily., Disp: 90 tablet, Rfl: 1 .  verapamil (CALAN) 120 MG tablet, TAKE 1/2 TABLET THREE TIMES DAILY, Disp: 135 tablet, Rfl: 3 .  ZOSTAVAX 15726 UNT/0.65ML injection, , Disp: , Rfl:   Exam: Current vital signs: BP 113/84 (BP Location: Right Arm)   Pulse 74   Temp 98.7 F (37.1 C) (Oral)   Resp 16   LMP 04/05/2012   SpO2 100%  Vital signs in last 24 hours: Temp:  [97.7 F (36.5 C)-98.7 F (37.1 C)] 98.7 F (37.1 C) (01/14 1539) Pulse Rate:  [72-80] 74 (01/14 1730) Resp:  [16] 16 (01/14 1706) BP: (112-126)/(47-84) 113/84 (01/14 1706) SpO2:  [99 %-100 %] 100 % (01/14 1730) Weight:  [89.9 kg] 89.9 kg (01/14 1024) GENERAL: Awake, alert in NAD HEENT: - Normocephalic and atraumatic,  dry mm, no LN++, no Thyromegally.  Palpable temporal pulses and no palpable tenderness on the scalp or temple. LUNGS - Clear to auscultation bilaterally with no wheezes CV - S1S2 RRR, no m/r/g, equal pulses bilaterally. ABDOMEN - Soft, nontender, nondistended with normoactive BS Ext: warm, well perfused, intact peripheral pulses, no edema NEURO:  Mental Status: AA&Ox3 Language: Speech is fluent and clear with no dysarthria.  Naming, comprehension and repetition are intact. Cranial nerves: Pupils were dilated for the ophthalmologically examination and are 4 mm mildly reactive bilaterally round-CAN NOT COMMENT ON APD DUE TO PHARMACOLOGICAL DILATATION, visual field exam reveals an altitudinal deficit in the superior field from the right eye  only, otherwise intact.  Extraocular movements intact.  Face symmetric.  Large acuity intact.  Palate midline.  Shoulder shrug intact.  Tongue midline. Motor exam: 5/5 with no vertical drift Sensory exam: Intact to light touch all over with no extinction Coordination: Intact finger-nose-finger testing bilaterally. Gait testing was deferred at this time. NIHSS-1 for visual   Labs I have reviewed labs in epic and the results pertinent to this consultation are: ESR 22, CRP less than 0.8.  CBC    Component Value Date/Time   WBC 4.4 01/22/2018 1554   RBC 4.93 01/22/2018 1554   HGB 13.4 01/22/2018 1554   HCT 42.0 01/22/2018 1554   PLT 253 01/22/2018 1554   MCV 85.2 01/22/2018 1554   MCH 27.2 01/22/2018 1554   MCHC 31.9 01/22/2018 1554   RDW 14.6 01/22/2018 1554   LYMPHSABS 1.6 06/03/2014 0628   MONOABS 0.4 06/03/2014 0628   EOSABS 0.0 06/03/2014 0628   BASOSABS 0.0 06/03/2014 0628   CMP     Component Value Date/Time   NA 141 01/22/2018 1554   NA 141 04/06/2017 1618   K 3.9 01/22/2018 1554   CL 106 01/22/2018 1554   CO2 26 01/22/2018 1554   GLUCOSE 93 01/22/2018 1554   BUN 15 01/22/2018 1554   BUN 15 04/06/2017 1618   CREATININE 1.23 (H)  01/22/2018 1554   CREATININE 1.03 (H) 11/19/2014 1131   CALCIUM 9.2 01/22/2018 1554   PROT 7.8 11/19/2014 1131   ALBUMIN 4.4 11/19/2014 1131   AST 18 11/19/2014 1131   ALT 19 11/19/2014 1131   ALKPHOS 59 11/19/2014 1131   BILITOT 0.5 11/19/2014 1131   GFRNONAA 45 (L) 01/22/2018 1554   GFRNONAA 57 (L) 11/19/2014 1131   GFRAA 52 (L) 01/22/2018 1554   GFRAA 65 11/19/2014 1131   Imaging I have reviewed the images obtained: MRI examination of the brain and orbits with and without contrast shows right optic nerve enhancement and scattered T2 hyperintensities with no abnormal enhancement.  Assessment:  68 year old woman presenting for evaluation of 6 to 7 days worth of painful altitudinal vision loss from the right eye in the superior visual field, which has worsened over the last 5 to 6 days. Ophthalmology seen as an outpatient, sent to ER for further imaging and testing. Normal ESR CRP and no temporal tenderness or history of jaw claudication makes temporal arteritis less likely. Painful visual loss with enhancement of the optic nerve is more likely retrobulbar optic neuritis. In her age, demyelination is not impossible but other inflammatory causes such as lupus and sarcoid should also be evaluated. Infectious etiology such as a pneumonia and UTI should be ruled out prior to starting steroids  Impression: -Painful altitudinal visual loss of the right eye for 6 to 7 days -retrobulbar optic neuritis from demyelination versus other inflammatory cause such as lupus or sarcoid. -Giant cell arteritis is less likely given history, clinical exam and normal inflammatory markers. -Less likely ischemic optic neuropathy - will need outpatient ophthalmological evaluation and fluorescein angiogram as an outpatient.  Recommendations: -Obtain chest x-ray rule out pneumonia -Obtain urinalysis to rule out UTI -Check lupus panel -Check serum ACE level -If both CXR and UA are negative, start patient on  Solu-Medrol 1 g IV x1 for at least 3 days. Based on the response, can continue for a total of 5 days.  Some patients based on their insurance and ability to get outpatient infusions scheduled have received 2-3 doses of steroids in house and remainder done at  home with a home infusion nurse - will defer that to the primary team. -GI protection with Protonix while on steroids and monitor sugars closely while on steroids. -She will need outpatient neurology follow-up- and also might benefit from an outpatient spinal tap if deemed appropriate by the outpatient neurologist to look for oligoclonal bands and IgG index.  She might also benefit from visual evoked potentials which might help bolster the diagnosis of demyelination if P100 is abnormal. -Outpatient ophthalmology follow-up for possible fluorescein angiogram.  She might also benefit from a OCT as an outpatient-will defer the ophthalmology and outpatient neurology.  -- Amie Portland, MD [facility] Pager: [phone] If 7pm to 7am, please call on call as listed on AMION.

## 2018-01-22 NOTE — ED Notes (Signed)
Patient transported to MRI 

## 2018-01-22 NOTE — Patient Instructions (Signed)
Thank you for allowing [name] to provide your care today.  You came in due to vision problem on the right eye.  As we discuss I set an urgent appointment to ophthalmology for you today at 1:15 PM at [facility] eye care. Please follow their instruction. Please follow-up with [name] as needed.  Should you have any questions or concerns please call the internal medicine clinic at [phone].    Thank you

## 2018-01-22 NOTE — ED Triage Notes (Signed)
Pt here from MD [facility] office with a visual field change.  Pt ambulatory without assistance.  Symptoms have been there since last Wednesday.  MRI and blood ordered by request of [place] MD. No neuro deficits.

## 2018-01-22 NOTE — Progress Notes (Signed)
  CC: Right eye vision problem  HPI:  [name] is a 68 y.o. female with PMHx listed below, presented to clinic due to right eye partial vision loss. please see problem based charting for further details and assessment and plan.  Past Medical History:  Diagnosis Date  . Anxiety state, unspecified   . Arthritis   . H/O: hysterectomy   . Hypertension   . Morbid obesity ([place])   . Myopia   . NSVT (nonsustained ventricular tachycardia) ([place])    a. 01/2005 Echo: EF 55-65%  . Osteopenia   . Other and unspecified hyperlipidemia   . Palpitations   . Umbilical hernia   . Unspecified essential hypertension    Review of Systems: Review of Systems  Constitutional: Negative for chills and fever.  HENT: Negative for congestion.   Eyes: Negative for double vision, photophobia, discharge and redness.       Visual field deficit  Respiratory: Negative for cough and shortness of breath.   Cardiovascular: Negative for chest pain and leg swelling.  Skin: Negative for rash.    Physical Exam:  Vitals:   01/22/18 1024  BP: (!) 112/47  Pulse: 80  Temp: 97.7 F (36.5 C)  TempSrc: Oral  SpO2: 99%  Weight: 198 lb 1.6 oz (89.9 kg)  Height: 5\' 2"  (1.575 m)   Physical Exam Constitutional:      General: She is not in acute distress.    Appearance: She is not ill-appearing.  Eyes:     General: Lids are normal. Visual field deficit present.        Right eye: No discharge.        Left eye: No discharge.     Conjunctiva/sclera: Conjunctivae normal.     Right eye: Right conjunctiva is not injected.     Left eye: Left conjunctiva is not injected.     Comments:  Right eye: upper field vision of the right eye is lost.  (She mentions that she can not see the upper part of the eye chart or upper part of my face at all and states that it is completely black)  No redness or discharge. Pupils are normal size and reactive to light (with probable delay at right eye reaction to light)  Left eye:  Left eye visual acuity and peripheral vision is intact.     Cardiovascular:     Rate and Rhythm: Normal rate and regular rhythm.     Pulses: Normal pulses.     Heart sounds: Normal heart sounds. No murmur.  Pulmonary:     Effort: Pulmonary effort is normal.     Breath sounds: Normal breath sounds. No wheezing or rales.  Abdominal:     General: Bowel sounds are normal.     Palpations: Abdomen is soft.     Tenderness: There is no abdominal tenderness.  Musculoskeletal:        General: No swelling or tenderness.     Right lower leg: No edema.     Left lower leg: No edema.  Neurological:     Mental Status: She is alert and oriented to person, place, and time.     Sensory: No sensory deficit.     Motor: No weakness.  Psychiatric:        Mood and Affect: Mood normal.        Behavior: Behavior normal.     Assessment & Plan:   See Encounters Tab for problem based charting.  Patient discussed with Dr. Rebeca Alert

## 2018-01-22 NOTE — Assessment & Plan Note (Addendum)
Ms. Windsor presented with sudden onset visual changes of right eye since 1 week ago and a day after getting flu shot. It started with "feeling like, there is a filter in front of her right eye". She then noticed some white spots around her visual field (right eye), and then lost upper half vision of right eye. She reports some mild bilateral pain above her eyes.  On exam: Right eye: upper field vision of the right eye is lost.  (She mentions that she can not see the upper part of the eye chart or upper part of my face at all and states that it is completely black)  No redness or discharge. Pupils are normal size and reactive to light (with probable delayed reflex at right eye)  Left eye: Left eye visual acuity and peripheral vision is intact.   Unilateral partial vision loss, concerning for retinal detachment (reports possible floaters?), retinal vascular occlusions, optic nerve involvement or other urgent ophthalmology etiology. Intracranial etiology is on differential, how ever, no other neurological abnormality and lack of bilateral eye involvement and the pattern, less likely for that and we do not order MRI before ophthalmology visit.  -Urgent ophthalmology refferal and follow up  (I scheduled an urgent referral appointment at [facility] ophthalmology at 1:15 PM for further evaluation and management.)  Addendum 5:17 PM--->MR orbit w w/o contrast ordered by [facility] ophthalmology (no note available at chart at this point). Patient is at ED now to get the imaging. Result pending.

## 2018-01-22 NOTE — H&P (Addendum)
Date: 01/23/2018               Patient Name:  Michelle Hurley MRN: [id number]  DOB: 02-06-1950 Age / Sex: 68 y.o., female   PCP: Jean Rosenthal, MD         Medical Service: Internal Medicine Teaching Service         Attending Physician: Dr. Lucious Groves, DO    First Contact: Dr. Alfonse Spruce Pager: [phone]  Second Contact: Dr. Frederico Hamman Pager: [phone]       After Hours (After 5p/  First Contact Pager: [phone]  weekends / holidays): Second Contact Pager: [phone]   Chief Complaint: right eye visual loss  History of Present Illness: Ms. Troiani is a 68yo female with HTN, obesity, CKD3, anxiety who presented [facility] after seeing her ophthalmologist who suspected retrobulbar optic neuritis and sent her for emergent blood work and MRI for right eye vision loss.  She reports 7 days of progressive, painful right eye vision changes. She got the flu shot last Tuesday and woke up the following morning with blurry vision and mild pain in the right eye, described as having a film over her eye. She has similar symptoms related to allergies so she didn't pay much attention to it. The next day, she noticed a black spot in the upper half of her visual field and the pain was getting worse. She describes the pain as achy, located behind the upper half of the right eye, intermittent, worse with sharp eye movements to the right. Denies eye redness or discharge. She took ibuprofen with some relief. She has never had anything like this happen before. She was evaluated by an ophthalmologist who discovered right upper visual field loss and sent her to the ED.   Denies recent illness, fevers, cough, sob, chest pain, palpitations, dysuria, increased urinary frequency.  Denies headache, temporal tenderness, jaw claudication, myalgias, skin rash, or family history of autoimmune diseases. She does report arthritis, worse in her left shoulder and right hand (3rd DIP joint).    Meds:    Current Outpatient  Medications on File Prior to Encounter  Medication Sig Dispense Refill  . acetaminophen (TYLENOL) 325 MG tablet Take 2 tablets (650 mg total) by mouth every 6 (six) hours as needed for moderate pain, fever or headache. 100 tablet 0  . carvedilol (COREG) 25 MG tablet Take 1 tablet (25 mg total) by mouth 2 (two) times daily with a meal. 180 tablet 1  . diclofenac sodium (VOLTAREN) 1 % GEL Apply 2 g topically 4 (four) times daily. 1 Tube 2  . fluticasone (FLONASE) 50 MCG/ACT nasal spray Place 2 sprays into both nostrils daily. 1 g 0  . furosemide (LASIX) 40 MG tablet Take 1 tablet (40 mg total) by mouth daily. 90 tablet 1  . loratadine (CLARITIN) 10 MG tablet Take 1 tablet (10 mg total) by mouth daily. 30 tablet 2  . lovastatin (MEVACOR) 20 MG tablet TAKE 1 TABLET AT BEDTIME 90 tablet 1  . naproxen (NAPROSYN) 375 MG tablet Take 1 tablet (375 mg total) by mouth 2 (two) times daily with a meal. 20 tablet 0  . omeprazole (PRILOSEC) 20 MG capsule Take 1 capsule (20 mg total) by mouth daily for 28 days. 28 capsule 0  . spironolactone (ALDACTONE) 25 MG tablet Take 1 tablet (25 mg total) by mouth daily. 90 tablet 1  . verapamil (CALAN) 120 MG tablet TAKE 1/2 TABLET THREE TIMES DAILY 135 tablet 3  Allergies: Allergies as of 01/22/2018 - Review Complete 01/22/2018  Allergen Reaction Noted  . Mushroom extract complex Anaphylaxis, Itching, and Swelling 05/03/2014  . Codeine Nausea And Vomiting 05/03/2014  . Hydrocodone Nausea And Vomiting 05/05/2014  . Codeine Nausea And Vomiting   . Prednisone Nausea And Vomiting 12/20/2010   Past Medical History:  Diagnosis Date  . Anxiety state, unspecified   . Arthritis   . H/O: hysterectomy   . Hypertension   . Morbid obesity ([place])   . Myopia   . NSVT (nonsustained ventricular tachycardia) ([place])    a. 01/2005 Echo: EF 55-65%  . Osteopenia   . Other and unspecified hyperlipidemia   . Palpitations   . Umbilical hernia   . Unspecified essential  hypertension     Family History: CAD (sister, dad), diabetes (sister). Cancer, unknown type (mom). No fam hx of lupus, sarcoid, or MS  Social History: lives alone, independent in ADLs and IADLs. Retired from [employer]. Stays pretty active caring for young grandchildren and traveling to visit family. Never smoker. No alcohol use.   Review of Systems: A complete ROS was negative except as per HPI.   Physical Exam: Blood pressure 120/74, pulse 69, temperature (!) 97.5 F (36.4 C), temperature source Oral, resp. rate 18, height '5\' 2"'  (1.575 m), weight 87.8 kg, last menstrual period 04/05/2012, SpO2 100 %. Vitals:   01/22/18 2120 01/22/18 2200 01/22/18 2315 01/23/18 0028  BP:  (!) 128/91 110/62 120/74  Pulse: 74 75 79 69  Resp:  16  18  Temp:    (!) 97.5 F (36.4 C)  TempSrc:    Oral  SpO2: 99% 97% 96% 100%  Weight:    87.8 kg  Height:    '5\' 2"'  (1.575 m)   General: Vital signs reviewed.  Patient is well-developed and well-nourished, in no acute distress and cooperative with exam.  Head: Normocephalic and atraumatic. No tenderness over temporal region Eyes: EOMI with pain on right lateral gaze, conjunctivae normal, no scleral icterus. No drainage. Right eye superior visual field is absent. PERRL. Color vision is intact.  Neck: Supple, trachea midline, normal ROM Cardiovascular: RRR, S1 normal, S2 normal, no murmurs, gallops, or rubs. Pulmonary/Chest: Clear to auscultation bilaterally, no wheezes, rales, or rhonchi. Abdominal: Soft, non-tender, non-distended, BS + Musculoskeletal: No joint deformities, erythema, or stiffness, ROM full and nontender. Extremities: No lower extremity edema bilaterally,  pulses symmetric and intact bilaterally. No cyanosis or clubbing. Neurological: A&O x3, Strength is normal and symmetric bilaterally, cranial nerve II-XII are intact, no focal motor deficit, sensory intact to light touch bilaterally.  Skin: Warm, dry and intact. No rashes or  erythema. Psychiatric: Normal mood and affect. speech and behavior is normal. Cognition and memory are normal.    Labs: CMET     Creatine 1.23/BUN 15 CBC     Unremarkable  Sed rate 22 CRP < 0.8  Brain/orbits MRI: 1. Abnormal enhancement involving the right optic nerve as above, suggesting acute optic neuritis. 2. No other acute intracranial abnormality. 3. Mild chronic small vessel ischemic disease for age. 4. Multiple scattered chronic micro hemorrhages involving the right thalamus and pons, most likely related to chronic underlying hypertension.     CXR: personally reviewed my interpretation is normal cardiac silhouette, no bony abnormalities, no pulmonary consolidations or effusions.   Assessment & Plan by Problem: Active Problems:   Optic neuritis  Ms. Almanzar is a 68yo female with HTN, obesity, CKD3, anxiety who presented to [facility] for 6-7 days of progressive,  painful right eye upper field vision loss, MRI revealed evidence of acute optic neuritis.  Optic Neuritis: MR orbit found right optic neuritis, no other acute intracranial changes. Unknown cause. In an older patient (>50 years), ischemic optic neuropathy (due, for example, to diabetes mellitus or giant cell arteritis) is a more likely diagnosis than optic neuritis. Last a1c was in 2015 and was normal. I doubt temporal arteritis given normal ESR, CRP, and no temporal tenderness, jaw claudication, or proximal myalgias. Doubt MS given older age. She denies preceding infectious symptoms that would suggest a postviral etiology. Etiology of optic neuritis could be lupus or sarcoid, further evaluation warranted.  - neurology consulted in the ED - add on a1c (did endorse polydipsia)  - f/u angiotensin converting enzyme and lupus panel, ANA, C3, anti-dsDNA  - r/u infectious etiology prior to starting steroids - CXR does not show any signs of pulmonary consolidation or effusion - UA and UCx has not been collected - If UA is  negative for infection, start solu-medrol 1g IV daily for at least 3-5 days (home infusions may be possible depending on her insurance) - Pt states allergy to prednisone, "itching and coughing." States that she last had it in 2012 and does not want to take it again. I can't find any notes documenting her reaction. Will need to consider premedicating with benadryl - If w/u neg will need outpt visual evoked pot and fluorescein angiogram outpt to be considered.  HTN: Chronic, well controlled. Continue home meds including coreg 76m bid, spironolactone 237mdaily, verapamil 6065mID, Furosemide 30m71mily   HLD: continue home statin Allergies: continue home loratadine  CKD 3: Chronic, stable.   DVT ppx: lovenox  Diet: HH Code: full, confirmed with patient   Dispo: Admit patient to Observation with expected length of stay less than 2 midnights.  Signed: VogeIsabelle Course 01/23/2018, 12:42 AM  Pager: [phone]

## 2018-01-22 NOTE — ED Provider Notes (Signed)
[facility] EMERGENCY DEPARTMENT Provider Note   CSN: [id number] Arrival date & time: 01/22/18  1530     History   Chief Complaint Chief Complaint  Patient presents with  . Visual Field Change    HPI Michelle Hurley is a 68 y.o. female.  HPI  Patient is a 68 yo female with a history of hypertension, morbid obesity presenting for right visual deficit.  Patient ports a began 6 days ago and she woke up with the symptoms.  She reports that the superior aspect of her visual field of the right eye is "black."  Patient reports that it has remained unchanged over the past 6 days.  She reports some mild periorbital particulate supraorbital discomfort to palpation of each orbit bilaterally, but no temporal tenderness.  Denies any jaw claudication or other facial pain.  She denies any weakness in her extremities, numbness in extremities, left eye visual deficit, or blurred or double vision.  Patient denies any personal history of CVA.  She does report she has an extensive family history in first-degree relatives of CVA, but no other neurologic conditions.  Patient denies any personal family history of autoimmune disease to her knowledge.  Patient saw her primary care provider today who referred her to ophthalmology who ultimately everted to the emergency department for urgent MRI brain and orbits.   Past Medical History:  Diagnosis Date  . Anxiety state, unspecified   . Arthritis   . H/O: hysterectomy   . Hypertension   . Morbid obesity ([place])   . Myopia   . NSVT (nonsustained ventricular tachycardia) ([place])    a. 01/2005 Echo: EF 55-65%  . Osteopenia   . Other and unspecified hyperlipidemia   . Palpitations   . Umbilical hernia   . Unspecified essential hypertension     Patient Active Problem List   Diagnosis Date Noted  . Peripheral vision loss, right 01/22/2018  . Vision loss of right eye 01/22/2018  . Sciatica 01/15/2018  . Right elbow pain 06/11/2017  . Rash  03/23/2017  . Estrogen deficiency 11/16/2016  . Right leg pain 09/06/2016  . Right ankle injury, initial encounter 06/12/2016  . Plantar fasciitis, bilateral 06/12/2016  . Pain in joint, ankle and foot 02/11/2016  . Atherosclerosis of abdominal aorta ([place]) 10/01/2015  . Preventative health care 04/16/2015  . Allergic rhinitis 04/16/2015  . Dizziness 04/06/2012  . Hx-TIA (transient ischemic attack) 04/06/2012  . VENTRICULAR TACHYCARDIA 12/04/2008  . HYPERLIPIDEMIA 12/24/2006  . OBESITY, MORBID 12/24/2006  . Essential hypertension 12/24/2006    Past Surgical History:  Procedure Laterality Date  . ABDOMINAL HYSTERECTOMY  1974  . ESOPHAGOGASTRODUODENOSCOPY N/A 05/28/2014   Procedure: ESOPHAGOGASTRODUODENOSCOPY (EGD);  Surgeon: Teena Irani, MD;  Location: [facility];  Service: Endoscopy;  Laterality: N/A;  . LAPAROSCOPIC SMALL BOWEL RESECTION N/A 06/04/2014   Procedure: LAPAROSCOPIC SMALL BOWEL RESECTION;  Surgeon: Fanny Skates, MD;  Location: [facility];  Service: General;  Laterality: N/A;  . LAPAROSCOPIC TRANSABDOMINAL HERNIA N/A 05/03/2014   Procedure: LAPAROSCOPIC EXPLORATION WITH OPEN REPAIR OF INCARCERATED EPIGASTRIC HERNIA ;  Surgeon: Alphonsa Overall, MD;  Location: [facility];  Service: General;  Laterality: N/A;     OB History    Gravida  0   Para  0   Term  0   Preterm  0   AB  0   Living        SAB  0   TAB  0   Ectopic  0   Multiple  Live Births               Home Medications    Prior to Admission medications   Medication Sig Start Date End Date Taking? Authorizing Provider  acetaminophen (TYLENOL) 325 MG tablet Take 2 tablets (650 mg total) by mouth every 6 (six) hours as needed for moderate pain, fever or headache. 02/02/16   Rice, Resa Miner, MD  carvedilol (COREG) 25 MG tablet Take 1 tablet (25 mg total) by mouth 2 (two) times daily with a meal. 11/28/17   Agyei, Caprice Kluver, MD  cetirizine (ZYRTEC) 1 MG/ML syrup Take 10 mLs (10 mg total) by mouth  daily. 04/21/16   Rice, Resa Miner, MD  clobetasol cream (TEMOVATE) 3.73 % Apply 1 application topically 2 (two) times daily. 04/06/17   Rice, Resa Miner, MD  diclofenac sodium (VOLTAREN) 1 % GEL Apply 2 g topically 4 (four) times daily. 06/11/17   Lacroce, Hulen Shouts, MD  fluticasone (FLONASE) 50 MCG/ACT nasal spray Place 2 sprays into both nostrils daily. 04/21/16   [name], MD  fluticasone (FLONASE) 50 MCG/ACT nasal spray Place 2 sprays into both nostrils daily. 07/05/17 08/04/17  Maryellen Pile, MD  furosemide (LASIX) 40 MG tablet Take 1 tablet (40 mg total) by mouth daily. 11/30/17   Jean Rosenthal, MD  gabapentin (NEURONTIN) 300 MG capsule Take 1 capsule (300 mg total) by mouth 2 (two) times daily. Take 1 tablet in the morning and 1 tablet at night. 09/06/16 09/06/17  Welford Roche, MD  GAVILYTE-N WITH FLAVOR PACK 420 g solution  01/23/15   [provider]  guaiFENesin-dextromethorphan (ROBITUSSIN DM) 100-10 MG/5ML syrup Take 5 mLs by mouth every 4 (four) hours as needed for cough. 07/05/17   Maryellen Pile, MD  ivermectin (STROMECTOL) 3 MG TABS tablet Take 6 tablets by mouth once then repeat once in 7 days. 03/23/17   Rice, Resa Miner, MD  loratadine (CLARITIN) 10 MG tablet Take 1 tablet (10 mg total) by mouth daily. 10/01/15 09/30/16  [name], MD  lovastatin (MEVACOR) 20 MG tablet TAKE 1 TABLET AT BEDTIME 09/20/17   Jean Rosenthal, MD  naproxen (NAPROSYN) 375 MG tablet Take 1 tablet (375 mg total) by mouth 2 (two) times daily with a meal. 06/12/16   Rice, Resa Miner, MD  omeprazole (PRILOSEC) 20 MG capsule Take 1 capsule (20 mg total) by mouth daily for 28 days. 07/18/17 08/15/17  Isabelle Course, MD  ondansetron (ZOFRAN) 4 MG tablet Take 1 tablet (4 mg total) by mouth every 8 (eight) hours as needed for nausea or vomiting. 02/02/16   Rice, Resa Miner, MD  polyethylene glycol powder (GLYCOLAX/MIRALAX) powder Take 17 g by mouth daily as needed for moderate  constipation or severe constipation. 04/30/15   Rice, Resa Miner, MD  scopolamine (TRANSDERM-SCOP, 1.5 MG,) 1 MG/3DAYS Place 1 patch (1.5 mg total) onto the skin every 3 (three) days as needed. 05/23/15   Rice, Resa Miner, MD  senna (SENOKOT) 8.6 MG TABS tablet Take 2 tablets (17.2 mg total) by mouth at bedtime as needed for mild constipation. 04/30/15   [name], MD  spironolactone (ALDACTONE) 25 MG tablet Take 1 tablet (25 mg total) by mouth daily. 11/28/17   Jean Rosenthal, MD  verapamil (CALAN) 120 MG tablet TAKE 1/2 TABLET THREE TIMES DAILY 08/14/17   Jean Rosenthal, MD  ZOSTAVAX 42876 UNT/0.65ML injection  05/26/15   [provider]    Family History Family History  Problem  Relation Age of Onset  . Heart disease Mother   . Diabetes Mother   . Thyroid cancer Sister   . Diabetes Sister   . Hypertension Brother   . Breast cancer Neg Hx     Social History Social History   Tobacco Use  . Smoking status: Never Smoker  . Smokeless tobacco: Never Used  Substance Use Topics  . Alcohol use: No    Alcohol/week: 0.0 standard drinks  . Drug use: No     Allergies   Mushroom extract complex; Codeine; Hydrocodone; Codeine; and Prednisone   Review of Systems Review of Systems  Constitutional: Negative for chills and fever.  HENT: Negative for congestion and sore throat.   Eyes: Positive for visual disturbance.  Respiratory: Negative for cough, chest tightness and shortness of breath.   Cardiovascular: Negative for chest pain and leg swelling.  Gastrointestinal: Negative for abdominal pain, nausea and vomiting.  Genitourinary: Negative for dysuria and flank pain.  Musculoskeletal: Negative for back pain and myalgias.  Skin: Negative for rash.  Neurological: Positive for headaches. Negative for dizziness, syncope and light-headedness.     Physical Exam Updated Vital Signs BP (!) 143/98   Pulse 74   Temp 98.7 F (37.1 C) (Oral)   Resp 16   LMP 04/05/2012    SpO2 99%   Physical Exam Vitals signs and nursing note reviewed.  Constitutional:      General: She is not in acute distress.    Appearance: She is well-developed.  HENT:     Head: Normocephalic and atraumatic.     Comments: No tenderness to palpation of bilateral temporal regions. Eyes:     Conjunctiva/sclera: Conjunctivae normal.     Pupils: Pupils are equal, round, and reactive to light.  Neck:     Musculoskeletal: Normal range of motion and neck supple.  Cardiovascular:     Rate and Rhythm: Normal rate and regular rhythm.     Heart sounds: S1 normal and S2 normal. No murmur.  Pulmonary:     Effort: Pulmonary effort is normal.     Breath sounds: Normal breath sounds. No wheezing or rales.  Abdominal:     General: There is no distension.     Palpations: Abdomen is soft.     Tenderness: There is no abdominal tenderness. There is no guarding.  Musculoskeletal: Normal range of motion.        General: No deformity.  Lymphadenopathy:     Cervical: No cervical adenopathy.  Skin:    General: Skin is warm and dry.     Findings: No erythema or rash.  Neurological:     Mental Status: She is alert.     Comments: Mental Status:  Alert, oriented, thought content appropriate, able to give a coherent history. Speech fluent without evidence of aphasia. Able to follow 2 step commands without difficulty.  Cranial Nerves:  II:  Peripheral visual fields not intact in right eye superior fields suggesting altitudinal visual field deficit, pupils equal, round, 92m bilaterally but not reactive (like 2/2 recent dilation)  III,IV, VI: ptosis not present, extra-ocular motions intact bilaterally  V,VII: smile symmetric, facial light touch sensation equal VIII: hearing grossly normal to voice  X: uvula elevates symmetrically  XI: bilateral shoulder shrug symmetric and strong XII: midline tongue extension without fassiculations Motor:  Normal tone. 5/5 in upper and lower extremities bilaterally  including strong and equal grip strength and dorsiflexion/plantar flexion Sensory: Pinprick and light touch normal in all extremities.  Deep Tendon Reflexes:  2+ and symmetric in the biceps and patella. Cerebellar: normal finger-to-nose with bilateral upper extremities Stance: No pronator drift and good coordination, strength, and position sense with tapping of bilateral arms (performed in sitting position). CV: distal pulses palpable throughout    Psychiatric:        Behavior: Behavior normal.        Thought Content: Thought content normal.        Judgment: Judgment normal.      ED Treatments / Results  Labs (all labs ordered are listed, but only abnormal results are displayed) Labs Reviewed  BASIC METABOLIC PANEL - Abnormal; Notable for the following components:      Result Value   Creatinine, Ser 1.23 (*)    GFR calc non Af Amer 45 (*)    GFR calc Af Amer 52 (*)    All other components within normal limits  CBC  C-REACTIVE PROTEIN  SEDIMENTATION RATE  URINALYSIS, ROUTINE W REFLEX MICROSCOPIC  EXTRACTABLE NUCLEAR ANTIGEN ANTIBODY  ANTI-JO 1 ANTIBODY, IGG  ANGIOTENSIN CONVERTING ENZYME    EKG None  Radiology Mr Jeri Cos And Wo Contrast  Result Date: 01/22/2018 CLINICAL DATA:  Initial evaluation for acute right-sided visual changes. EXAM: MRI HEAD AND ORBITS WITHOUT AND WITH CONTRAST TECHNIQUE: Multiplanar, multiecho pulse sequences of the brain and surrounding structures were obtained without and with intravenous contrast. Multiplanar, multiecho pulse sequences of the orbits and surrounding structures were obtained including fat saturation techniques, before and after intravenous contrast administration. CONTRAST:  9 cc of Gadavist. COMPARISON:  Previous MRI from 02/29/2014. FINDINGS: MRI HEAD FINDINGS Brain: Cerebral volume within normal limits for age. Few scattered subcentimeter T2/FLAIR hyperintensities noted within the periventricular and deep white matter both cerebral  hemispheres, nonspecific. Mild chronic small vessel ischemic changes present within the pons. No abnormal foci of restricted diffusion to suggest acute or subacute ischemia. Gray-white matter differentiation maintained. No encephalomalacia to suggest chronic cortical infarction. No acute intracranial hemorrhage. Few scattered chronic micro hemorrhages noted involving the pons and right thalamus, most likely related to underlying hypertension. No mass lesion, midline shift or mass effect. No hydrocephalus. No extra-axial fluid collection. 2 cherry gland and suprasellar region normal. Midline structures intact and normal. No abnormal enhancement within the brain. 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 Vascular: Major intracranial vascular flow voids are well maintained. Skull and upper cervical spine: Craniocervical junction normal. Upper cervical spine within normal limits. Bone marrow signal intensity normal. No scalp soft tissue abnormality. Other: No mastoid effusion.  Inner ear structures grossly normal. MRI ORBITS FINDINGS Orbits: Globes symmetric in size with normal appearance and morphology bilaterally. There is abnormal enhancement involving the right optic nerve at the level of the right orbital apex (series 16, image 10). Finding also seen on coronal sequence series 15, image 14). The nerve itself is fairly symmetric in size with the contralateral left. Findings suggest acute optic neuritis. No abnormality about the left optic nerve. Optic chiasm within normal limits. No abnormality along the visualized optic radiations. Extra-ocular muscles symmetric and normal. Intraconal and extraconal fat well-maintained. Superior orbital veins within normal limits. Lacrimal glands normal. Visualized sinuses: Clear. Soft tissues: Unremarkable. Limited intracranial: Unremarkable. IMPRESSION: 1. Abnormal enhancement involving the right optic nerve as above, suggesting acute optic neuritis. 2. No other acute  intracranial abnormality. 3. Mild chronic small vessel ischemic disease for age. 4. Multiple scattered chronic micro hemorrhages involving the right thalamus and pons, most likely related to chronic underlying hypertension. Electronically Signed   By:  Jeannine Boga M.D.   On: 01/22/2018 19:22   Mr Rosealee Albee EL Contrast  Result Date: 01/22/2018 CLINICAL DATA:  Initial evaluation for acute right-sided visual changes. EXAM: MRI HEAD AND ORBITS WITHOUT AND WITH CONTRAST TECHNIQUE: Multiplanar, multiecho pulse sequences of the brain and surrounding structures were obtained without and with intravenous contrast. Multiplanar, multiecho pulse sequences of the orbits and surrounding structures were obtained including fat saturation techniques, before and after intravenous contrast administration. CONTRAST:  9 cc of Gadavist. COMPARISON:  Previous MRI from 02/29/2014. FINDINGS: MRI HEAD FINDINGS Brain: Cerebral volume within normal limits for age. Few scattered subcentimeter T2/FLAIR hyperintensities noted within the periventricular and deep white matter both cerebral hemispheres, nonspecific. Mild chronic small vessel ischemic changes present within the pons. No abnormal foci of restricted diffusion to suggest acute or subacute ischemia. Gray-white matter differentiation maintained. No encephalomalacia to suggest chronic cortical infarction. No acute intracranial hemorrhage. Few scattered chronic micro hemorrhages noted involving the pons and right thalamus, most likely related to underlying hypertension. No mass lesion, midline shift or mass effect. No hydrocephalus. No extra-axial fluid collection. 2 cherry gland and suprasellar region normal. Midline structures intact and normal. No abnormal enhancement within the brain. 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 Vascular: Major intracranial vascular flow voids are well maintained. Skull and upper cervical spine: Craniocervical junction normal. Upper  cervical spine within normal limits. Bone marrow signal intensity normal. No scalp soft tissue abnormality. Other: No mastoid effusion.  Inner ear structures grossly normal. MRI ORBITS FINDINGS Orbits: Globes symmetric in size with normal appearance and morphology bilaterally. There is abnormal enhancement involving the right optic nerve at the level of the right orbital apex (series 16, image 10). Finding also seen on coronal sequence series 15, image 14). The nerve itself is fairly symmetric in size with the contralateral left. Findings suggest acute optic neuritis. No abnormality about the left optic nerve. Optic chiasm within normal limits. No abnormality along the visualized optic radiations. Extra-ocular muscles symmetric and normal. Intraconal and extraconal fat well-maintained. Superior orbital veins within normal limits. Lacrimal glands normal. Visualized sinuses: Clear. Soft tissues: Unremarkable. Limited intracranial: Unremarkable. IMPRESSION: 1. Abnormal enhancement involving the right optic nerve as above, suggesting acute optic neuritis. 2. No other acute intracranial abnormality. 3. Mild chronic small vessel ischemic disease for age. 4. Multiple scattered chronic micro hemorrhages involving the right thalamus and pons, most likely related to chronic underlying hypertension. Electronically Signed   By: Jeannine Boga M.D.   On: 01/22/2018 19:22    Procedures Procedures (including critical care time)  Medications Ordered in ED Medications  gadobutrol (GADAVIST) 1 MMOL/ML injection 9 mL (9 mLs Intravenous Contrast Given 01/22/18 1832)     Initial Impression / Assessment and Plan / ED Course  I have reviewed the triage vital signs and the nursing notes.  Pertinent labs & imaging results that were available during my care of the patient were reviewed by me and considered in my medical decision making (see chart for details).  Clinical Course as of Jan 24 208  Tue Jan 22, 2018  1728  Unlikely to be temporal   Sed Rate: 22 [AM]  3810 Reviewed records brought over from patient's ophthalmologist, Dr. Katy Fitch.  States that patient should be receiving MRI brain and orbits with and without contrast.  Appreciate his involvement.   [AM]  2241 Spoke with Dr.    [AM]    Clinical Course User Index [AM] Albesa Seen, PA-C    Patient  nontoxic-appearing, afebrile, otherwise neurologically intact with exception of the altitudinal visual field deficit.  MRI brain and orbits ordered per recommendation of ophthalmology.    Lab work overall unremarkable.  ESR and CRP are normal.  Do not suspect temporal arteritis based on exam and laboratory findings.  MRI brain and orbits demonstrating right optic neuritis.  Patient also has multiple microvascular microhemorrhages.  No acute CVA.  Case discussed with Dr. Rory Percy of neurology who states that it is unusual to develop optic neuritis secondary to multiple sclerosis at the age of 24 however not impossible.  MRI findings appear atypical for ischemic optic neuritis.  Recommends chest x-ray and urinalysis ruling out concomitant infection, as well as additional amatory studies for lupus and ACE level, and admission for high-dose IV steroids if an infectious work-up is negative.  Urine is pending at time of admission.  No signs of pneumonia.  Discussed with internal medicine teaching service resident, Dr. Donne Hazel who will await urine results. High dose steroids not initiated at this time. Appreciate her involvement in the care of this patient.   Final Clinical Impressions(s) / ED Diagnoses   Final diagnoses:  Optic neuritis    ED Discharge Orders    None       Tamala Julian 01/23/18 [place], [place], DO 01/24/18 [phone]

## 2018-01-23 ENCOUNTER — Other Ambulatory Visit: Payer: Self-pay

## 2018-01-23 DIAGNOSIS — Z888 Allergy status to other drugs, medicaments and biological substances status: Secondary | ICD-10-CM | POA: Diagnosis not present

## 2018-01-23 DIAGNOSIS — Z885 Allergy status to narcotic agent status: Secondary | ICD-10-CM

## 2018-01-23 DIAGNOSIS — Z90710 Acquired absence of both cervix and uterus: Secondary | ICD-10-CM | POA: Diagnosis not present

## 2018-01-23 DIAGNOSIS — E785 Hyperlipidemia, unspecified: Secondary | ICD-10-CM

## 2018-01-23 DIAGNOSIS — M8580 Other specified disorders of bone density and structure, unspecified site: Secondary | ICD-10-CM | POA: Diagnosis present

## 2018-01-23 DIAGNOSIS — I129 Hypertensive chronic kidney disease with stage 1 through stage 4 chronic kidney disease, or unspecified chronic kidney disease: Secondary | ICD-10-CM

## 2018-01-23 DIAGNOSIS — I10 Essential (primary) hypertension: Secondary | ICD-10-CM | POA: Diagnosis not present

## 2018-01-23 DIAGNOSIS — Z8249 Family history of ischemic heart disease and other diseases of the circulatory system: Secondary | ICD-10-CM | POA: Diagnosis not present

## 2018-01-23 DIAGNOSIS — R739 Hyperglycemia, unspecified: Secondary | ICD-10-CM | POA: Diagnosis present

## 2018-01-23 DIAGNOSIS — I619 Nontraumatic intracerebral hemorrhage, unspecified: Secondary | ICD-10-CM

## 2018-01-23 DIAGNOSIS — Z79899 Other long term (current) drug therapy: Secondary | ICD-10-CM | POA: Diagnosis not present

## 2018-01-23 DIAGNOSIS — H469 Unspecified optic neuritis: Secondary | ICD-10-CM | POA: Diagnosis present

## 2018-01-23 DIAGNOSIS — Z823 Family history of stroke: Secondary | ICD-10-CM | POA: Diagnosis not present

## 2018-01-23 DIAGNOSIS — N183 Chronic kidney disease, stage 3 (moderate): Secondary | ICD-10-CM

## 2018-01-23 DIAGNOSIS — K5900 Constipation, unspecified: Secondary | ICD-10-CM | POA: Diagnosis not present

## 2018-01-23 DIAGNOSIS — Z91018 Allergy to other foods: Secondary | ICD-10-CM

## 2018-01-23 DIAGNOSIS — E876 Hypokalemia: Secondary | ICD-10-CM | POA: Diagnosis present

## 2018-01-23 DIAGNOSIS — F419 Anxiety disorder, unspecified: Secondary | ICD-10-CM

## 2018-01-23 DIAGNOSIS — H5461 Unqualified visual loss, right eye, normal vision left eye: Secondary | ICD-10-CM | POA: Diagnosis present

## 2018-01-23 DIAGNOSIS — E669 Obesity, unspecified: Secondary | ICD-10-CM

## 2018-01-23 DIAGNOSIS — H5340 Unspecified visual field defects: Secondary | ICD-10-CM

## 2018-01-23 DIAGNOSIS — T380X5A Adverse effect of glucocorticoids and synthetic analogues, initial encounter: Secondary | ICD-10-CM | POA: Diagnosis present

## 2018-01-23 DIAGNOSIS — Z7951 Long term (current) use of inhaled steroids: Secondary | ICD-10-CM | POA: Diagnosis not present

## 2018-01-23 DIAGNOSIS — Z833 Family history of diabetes mellitus: Secondary | ICD-10-CM | POA: Diagnosis not present

## 2018-01-23 DIAGNOSIS — M159 Polyosteoarthritis, unspecified: Secondary | ICD-10-CM

## 2018-01-23 DIAGNOSIS — F411 Generalized anxiety disorder: Secondary | ICD-10-CM | POA: Diagnosis present

## 2018-01-23 DIAGNOSIS — Z791 Long term (current) use of non-steroidal anti-inflammatories (NSAID): Secondary | ICD-10-CM | POA: Diagnosis not present

## 2018-01-23 LAB — GLUCOSE, CAPILLARY
GLUCOSE-CAPILLARY: 128 mg/dL — AB (ref 70–99)
Glucose-Capillary: 89 mg/dL (ref 70–99)
Glucose-Capillary: 157 mg/dL — ABNORMAL HIGH (ref 70–99)
Glucose-Capillary: 231 mg/dL — ABNORMAL HIGH (ref 70–99)
Glucose-Capillary: 144 mg/dL — ABNORMAL HIGH (ref 70–99)

## 2018-01-23 LAB — URINALYSIS, ROUTINE W REFLEX MICROSCOPIC
BILIRUBIN URINE: NEGATIVE
Bilirubin Urine: NEGATIVE
Glucose, UA: NEGATIVE mg/dL
Glucose, UA: NEGATIVE mg/dL
Hgb urine dipstick: NEGATIVE
Hgb urine dipstick: NEGATIVE
Ketones, ur: NEGATIVE mg/dL
Ketones, ur: NEGATIVE mg/dL
Leukocytes, UA: NEGATIVE
Nitrite: POSITIVE — AB
Nitrite: NEGATIVE
PH: 6 (ref 5.0–8.0)
Protein, ur: NEGATIVE mg/dL
Protein, ur: NEGATIVE mg/dL
Specific Gravity, Urine: 1.023 (ref 1.005–1.030)
Specific Gravity, Urine: 1.019 (ref 1.005–1.030)
pH: 5 (ref 5.0–8.0)

## 2018-01-23 LAB — VITAMIN B12: VITAMIN B 12: 664 pg/mL (ref 180–914)

## 2018-01-23 LAB — BASIC METABOLIC PANEL
Anion gap: 12 (ref 5–15)
BUN: 16 mg/dL (ref 8–23)
CALCIUM: 9.3 mg/dL (ref 8.9–10.3)
CO2: 24 mmol/L (ref 22–32)
CREATININE: 1.11 mg/dL — AB (ref 0.44–1.00)
Chloride: 104 mmol/L (ref 98–111)
GFR calc Af Amer: 59 mL/min — ABNORMAL LOW (ref >60)
GFR calc non Af Amer: 51 mL/min — ABNORMAL LOW (ref >60)
Glucose, Bld: 80 mg/dL (ref 70–99)
Potassium: 3.3 mmol/L — ABNORMAL LOW (ref 3.5–5.1)
Sodium: 140 mmol/L (ref 135–145)

## 2018-01-23 LAB — HEMOGLOBIN A1C
Hgb A1c MFr Bld: 6.2 % — ABNORMAL HIGH (ref 4.8–5.6)
Mean Plasma Glucose: 131.24 mg/dL

## 2018-01-23 LAB — HIV ANTIBODY (ROUTINE TESTING W REFLEX): HIV Screen 4th Generation wRfx: NONREACTIVE

## 2018-01-23 MED ORDER — INSULIN ASPART 100 UNIT/ML ~~LOC~~ SOLN
0.0000 [IU] | SUBCUTANEOUS | Status: DC
  Administered 2018-01-23: 4 [IU] via SUBCUTANEOUS
  Administered 2018-01-23: 7 [IU] via SUBCUTANEOUS
  Administered 2018-01-24 (×2): 4 [IU] via SUBCUTANEOUS
  Administered 2018-01-24 (×2): 3 [IU] via SUBCUTANEOUS
  Administered 2018-01-24: 4 [IU] via SUBCUTANEOUS
  Administered 2018-01-24 (×2): 3 [IU] via SUBCUTANEOUS
  Administered 2018-01-25: 4 [IU] via SUBCUTANEOUS
  Administered 2018-01-25 (×4): 3 [IU] via SUBCUTANEOUS
  Administered 2018-01-26: 4 [IU] via SUBCUTANEOUS
  Administered 2018-01-26: 3 [IU] via SUBCUTANEOUS
  Administered 2018-01-26: 4 [IU] via SUBCUTANEOUS
  Administered 2018-01-26: 11 [IU] via SUBCUTANEOUS
  Administered 2018-01-26: 7 [IU] via SUBCUTANEOUS
  Administered 2018-01-26 – 2018-01-27 (×2): 3 [IU] via SUBCUTANEOUS
  Administered 2018-01-27 (×2): 4 [IU] via SUBCUTANEOUS

## 2018-01-23 MED ORDER — FUROSEMIDE 40 MG PO TABS
40.0000 mg | ORAL_TABLET | Freq: Every day | ORAL | Status: DC
  Administered 2018-01-23 – 2018-01-27 (×5): 40 mg via ORAL
  Filled 2018-01-23 (×5): qty 1

## 2018-01-23 MED ORDER — SODIUM CHLORIDE 0.9 % IV SOLN
INTRAVENOUS | Status: DC | PRN
  Administered 2018-01-23: 250 mL via INTRAVENOUS

## 2018-01-23 MED ORDER — PANTOPRAZOLE SODIUM 40 MG PO TBEC
40.0000 mg | DELAYED_RELEASE_TABLET | Freq: Every day | ORAL | Status: DC
  Administered 2018-01-23 – 2018-01-27 (×5): 40 mg via ORAL
  Filled 2018-01-23 (×6): qty 1

## 2018-01-23 MED ORDER — LORATADINE 10 MG PO TABS
10.0000 mg | ORAL_TABLET | Freq: Every day | ORAL | Status: DC
  Administered 2018-01-23 – 2018-01-27 (×5): 10 mg via ORAL
  Filled 2018-01-23 (×5): qty 1

## 2018-01-23 MED ORDER — SODIUM CHLORIDE 0.9 % IV SOLN
1000.0000 mg | Freq: Every day | INTRAVENOUS | Status: AC
  Administered 2018-01-23 – 2018-01-27 (×5): 1000 mg via INTRAVENOUS
  Filled 2018-01-23 (×5): qty 8

## 2018-01-23 NOTE — Progress Notes (Signed)
Subjective: There has been some slight improvement to the patient's altitudinal right monocular visual field defect since first dose of IV Solumedrol. She states that formerly it was like "half of a pie" missing from the vertical half of her visual fields and that a portion of the missing vision has come back. The portions affected have no vision - "all black". Eye pain with movement is now resolved.   Objective: Current vital signs: BP 110/80 (BP Location: Right Arm)   Pulse 73   Temp 99 F (37.2 C) (Oral)   Resp 18   Ht _0  (1.575 m)   Wt 87.8 kg   LMP 04/05/2012   SpO2 98%   BMI 35.40 kg/m  Vital signs in last 24 hours: Temp:  [97.5 F (36.4 C)-99 F (37.2 C)] 99 F (37.2 C) (01/15 0818) Pulse Rate:  [63-80] 73 (01/15 0818) Resp:  [16-18] 18 (01/15 0818) BP: (110-143)/(47-98) 110/80 (01/15 0818) SpO2:  [96 %-100 %] 98 % (01/15 0818) Weight:  [87.8 kg-89.9 kg] 87.8 kg (01/15 0028)  Intake/Output from previous day: 01/14 0701 - 01/15 0700 In: 360 [P.O.:360] Out: 200 [Urine:200] Intake/Output this shift: Total I/O In: 240 [P.O.:240] Out: -  Nutritional status:  Diet Order            Diet 2 gram sodium Room service appropriate? Yes; Fluid consistency: Thin  Diet effective now             HEENT: [place]/AT Lungs: Respirations unlabored Ext: Warm and well perfused  Neurologic Exam: Ment: Alert and oriented. Speech fluent.  CN: PERRL without RAPD. Right eye vision with upper half missing except for sparing of a portion of the left upper quadrant. Left eye with visual fields intact all 4 quadrants. EOMI without pain elicited by movement. No nystagmus. Face symmetric. Tongue midline. Motor: 5/5 x 5 Sensory: Normal response to tactile stimulation   Lab Results: Results for orders placed or performed during the hospital encounter of 01/22/18 (from the past 48 hour(s))  CBC     Status: None   Collection Time: 01/22/18  3:54 PM  Result Value Ref Range   WBC 4.4 4.0 -  10.5 K/uL   RBC 4.93 3.87 - 5.11 MIL/uL   Hemoglobin 13.4 12.0 - 15.0 g/dL   HCT 42.0 36.0 - 46.0 %   MCV 85.2 80.0 - 100.0 fL   MCH 27.2 26.0 - 34.0 pg   MCHC 31.9 30.0 - 36.0 g/dL   RDW 14.6 11.5 - 15.5 %   Platelets 253 150 - 400 K/uL   nRBC 0.0 0.0 - 0.2 %    Comment: Performed at [facility], [place] [address] [place], [place] [address]  Basic metabolic panel     Status: Abnormal   Collection Time: 01/22/18  3:54 PM  Result Value Ref Range   Sodium 141 135 - 145 mmol/L   Potassium 3.9 3.5 - 5.1 mmol/L    Comment: HEMOLYSIS AT THIS LEVEL MAY AFFECT RESULT   Chloride 106 98 - 111 mmol/L   CO2 26 22 - 32 mmol/L   Glucose, Bld 93 70 - 99 mg/dL   BUN 15 8 - 23 mg/dL   Creatinine, Ser 1.23 (H) 0.44 - 1.00 mg/dL   Calcium 9.2 8.9 - 10.3 mg/dL   GFR calc non Af Amer 45 (L) >60 mL/min   GFR calc Af Amer 52 (L) >60 mL/min   Anion gap 9 5 - 15    Comment: Performed at [facility], [place] [address] [place], [place] [address]  C-reactive protein     Status: None   Collection Time: 01/22/18  3:54 PM  Result Value Ref Range   CRP <0.8 <1.0 mg/dL    Comment: Performed at [facility], [place] [address] [place], [place] [address]  Sedimentation rate     Status: None   Collection Time: 01/22/18  3:54 PM  Result Value Ref Range   Sed Rate 22 0 - 22 mm/hr    Comment: Performed at [facility] [address], [place], [place] [address]  Urinalysis, Routine w reflex microscopic     Status: Abnormal   Collection Time: 01/23/18  1:13 AM  Result Value Ref Range   Color, Urine YELLOW YELLOW   APPearance HAZY (A) CLEAR   Specific Gravity, Urine 1.023 1.005 - 1.030   pH 5.0 5.0 - 8.0   Glucose, UA NEGATIVE NEGATIVE mg/dL   Hgb urine dipstick NEGATIVE NEGATIVE   Bilirubin Urine NEGATIVE NEGATIVE   Ketones, ur NEGATIVE NEGATIVE mg/dL   Protein, ur NEGATIVE NEGATIVE mg/dL   Nitrite POSITIVE (A) NEGATIVE   Leukocytes, UA NEGATIVE NEGATIVE   RBC / HPF 0-5 0 - 5 RBC/hpf    WBC, UA 11-20 0 - 5 WBC/hpf   Bacteria, UA MANY (A) NONE SEEN   Squamous Epithelial / LPF 6-10 0 - 5   Mucus PRESENT     Comment: Performed at [facility], [address], [place], [place] [address]  Urinalysis, Routine w reflex microscopic     Status: Abnormal   Collection Time: 01/23/18  2:31 AM  Result Value Ref Range   Color, Urine YELLOW YELLOW   APPearance HAZY (A) CLEAR   Specific Gravity, Urine 1.019 1.005 - 1.030   pH 6.0 5.0 - 8.0   Glucose, UA NEGATIVE NEGATIVE mg/dL   Hgb urine dipstick NEGATIVE NEGATIVE   Bilirubin Urine NEGATIVE NEGATIVE   Ketones, ur NEGATIVE NEGATIVE mg/dL   Protein, ur NEGATIVE NEGATIVE mg/dL   Nitrite NEGATIVE NEGATIVE   Leukocytes, UA TRACE (A) NEGATIVE   RBC / HPF 0-5 0 - 5 RBC/hpf   WBC, UA 11-20 0 - 5 WBC/hpf   Bacteria, UA MANY (A) NONE SEEN   Squamous Epithelial / LPF 0-5 0 - 5   Mucus PRESENT    Hyaline Casts, UA PRESENT     Comment: Performed at [facility], [place] [address], [place], [place] [address]  Basic metabolic panel     Status: Abnormal   Collection Time: 01/23/18  2:47 AM  Result Value Ref Range   Sodium 140 135 - 145 mmol/L   Potassium 3.3 (L) 3.5 - 5.1 mmol/L   Chloride 104 98 - 111 mmol/L   CO2 24 22 - 32 mmol/L   Glucose, Bld 80 70 - 99 mg/dL   BUN 16 8 - 23 mg/dL   Creatinine, Ser 1.11 (H) 0.44 - 1.00 mg/dL   Calcium 9.3 8.9 - 10.3 mg/dL   GFR calc non Af Amer 51 (L) >60 mL/min   GFR calc Af Amer 59 (L) >60 mL/min   Anion gap 12 5 - 15    Comment: Performed at [facility] [address], [place], [place] [address]  Hemoglobin A1c     Status: Abnormal   Collection Time: 01/23/18  2:47 AM  Result Value Ref Range   Hgb A1c MFr Bld 6.2 (H) 4.8 - 5.6 %  Comment: (NOTE) Pre diabetes:          5.7%-6.4% Diabetes:              >6.4% Glycemic control for   <7.0% adults with diabetes    Mean Plasma Glucose 131.24 mg/dL    Comment: Performed at [facility] [address], [place],  [place] [address]  Vitamin B12     Status: None   Collection Time: 01/23/18  2:47 AM  Result Value Ref Range   Vitamin B-12 664 180 - 914 pg/mL    Comment: (NOTE) This assay is not validated for testing neonatal or myeloproliferative syndrome specimens for Vitamin B12 levels. Performed at [facility], [place] [address] [place], [place] [address]   Glucose, capillary     Status: None   Collection Time: 01/23/18  4:35 AM  Result Value Ref Range   Glucose-Capillary 89 70 - 99 mg/dL  Glucose, capillary     Status: Abnormal   Collection Time: 01/23/18  8:18 AM  Result Value Ref Range   Glucose-Capillary 128 (H) 70 - 99 mg/dL    No results found for this or any previous visit (from the past 240 hour(s)).  Lipid Panel No results for input(s): CHOL, TRIG, HDL, CHOLHDL, VLDL, LDLCALC in the last 72 hours.  Studies/Results: Dg Chest 2 View  Result Date: 01/22/2018 CLINICAL DATA:  Loss of vision in the right eye yesterday. EXAM: CHEST - 2 VIEW COMPARISON:  PA and lateral chest 01/21/2016. FINDINGS: The lungs are clear. Heart size is normal. No pneumothorax or pleural fluid. No acute or focal bony abnormality. IMPRESSION: Negative chest. Electronically Signed   By: Inge Rise M.D.   On: 01/22/2018 21:51   Mr Jeri Cos And Wo Contrast  Result Date: 01/22/2018 CLINICAL DATA:  Initial evaluation for acute right-sided visual changes. EXAM: MRI HEAD AND ORBITS WITHOUT AND WITH CONTRAST TECHNIQUE: Multiplanar, multiecho pulse sequences of the brain and surrounding structures were obtained without and with intravenous contrast. Multiplanar, multiecho pulse sequences of the orbits and surrounding structures were obtained including fat saturation techniques, before and after intravenous contrast administration. CONTRAST:  9 cc of Gadavist. COMPARISON:  Previous MRI from 02/29/2014. FINDINGS: MRI HEAD FINDINGS Brain: Cerebral volume within normal limits for age. Few scattered subcentimeter T2/FLAIR  hyperintensities noted within the periventricular and deep white matter both cerebral hemispheres, nonspecific. Mild chronic small vessel ischemic changes present within the pons. No abnormal foci of restricted diffusion to suggest acute or subacute ischemia. Gray-white matter differentiation maintained. No encephalomalacia to suggest chronic cortical infarction. No acute intracranial hemorrhage. Few scattered chronic micro hemorrhages noted involving the pons and right thalamus, most likely related to underlying hypertension. No mass lesion, midline shift or mass effect. No hydrocephalus. No extra-axial fluid collection. 2 cherry gland and suprasellar region normal. Midline structures intact and normal. No abnormal enhancement within the brain. 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 Vascular: Major intracranial vascular flow voids are well maintained. Skull and upper cervical spine: Craniocervical junction normal. Upper cervical spine within normal limits. Bone marrow signal intensity normal. No scalp soft tissue abnormality. Other: No mastoid effusion.  Inner ear structures grossly normal. MRI ORBITS FINDINGS Orbits: Globes symmetric in size with normal appearance and morphology bilaterally. There is abnormal enhancement involving the right optic nerve at the level of the right orbital apex (series 16, image 10). Finding also seen on coronal sequence series 15, image 14). The nerve itself is fairly symmetric in size  with the contralateral left. Findings suggest acute optic neuritis. No abnormality about the left optic nerve. Optic chiasm within normal limits. No abnormality along the visualized optic radiations. Extra-ocular muscles symmetric and normal. Intraconal and extraconal fat well-maintained. Superior orbital veins within normal limits. Lacrimal glands normal. Visualized sinuses: Clear. Soft tissues: Unremarkable. Limited intracranial: Unremarkable. IMPRESSION: 1. Abnormal enhancement involving the  right optic nerve as above, suggesting acute optic neuritis. 2. No other acute intracranial abnormality. 3. Mild chronic small vessel ischemic disease for age. 4. Multiple scattered chronic micro hemorrhages involving the right thalamus and pons, most likely related to chronic underlying hypertension. Electronically Signed   By: Jeannine Boga M.D.   On: 01/22/2018 19:22   Mr Rosealee Albee ZH Contrast  Result Date: 01/22/2018 CLINICAL DATA:  Initial evaluation for acute right-sided visual changes. EXAM: MRI HEAD AND ORBITS WITHOUT AND WITH CONTRAST TECHNIQUE: Multiplanar, multiecho pulse sequences of the brain and surrounding structures were obtained without and with intravenous contrast. Multiplanar, multiecho pulse sequences of the orbits and surrounding structures were obtained including fat saturation techniques, before and after intravenous contrast administration. CONTRAST:  9 cc of Gadavist. COMPARISON:  Previous MRI from 02/29/2014. FINDINGS: MRI HEAD FINDINGS Brain: Cerebral volume within normal limits for age. Few scattered subcentimeter T2/FLAIR hyperintensities noted within the periventricular and deep white matter both cerebral hemispheres, nonspecific. Mild chronic small vessel ischemic changes present within the pons. No abnormal foci of restricted diffusion to suggest acute or subacute ischemia. Gray-white matter differentiation maintained. No encephalomalacia to suggest chronic cortical infarction. No acute intracranial hemorrhage. Few scattered chronic micro hemorrhages noted involving the pons and right thalamus, most likely related to underlying hypertension. No mass lesion, midline shift or mass effect. No hydrocephalus. No extra-axial fluid collection. 2 cherry gland and suprasellar region normal. Midline structures intact and normal. No abnormal enhancement within the brain. 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 Vascular: Major intracranial vascular flow voids are well  maintained. Skull and upper cervical spine: Craniocervical junction normal. Upper cervical spine within normal limits. Bone marrow signal intensity normal. No scalp soft tissue abnormality. Other: No mastoid effusion.  Inner ear structures grossly normal. MRI ORBITS FINDINGS Orbits: Globes symmetric in size with normal appearance and morphology bilaterally. There is abnormal enhancement involving the right optic nerve at the level of the right orbital apex (series 16, image 10). Finding also seen on coronal sequence series 15, image 14). The nerve itself is fairly symmetric in size with the contralateral left. Findings suggest acute optic neuritis. No abnormality about the left optic nerve. Optic chiasm within normal limits. No abnormality along the visualized optic radiations. Extra-ocular muscles symmetric and normal. Intraconal and extraconal fat well-maintained. Superior orbital veins within normal limits. Lacrimal glands normal. Visualized sinuses: Clear. Soft tissues: Unremarkable. Limited intracranial: Unremarkable. IMPRESSION: 1. Abnormal enhancement involving the right optic nerve as above, suggesting acute optic neuritis. 2. No other acute intracranial abnormality. 3. Mild chronic small vessel ischemic disease for age. 4. Multiple scattered chronic micro hemorrhages involving the right thalamus and pons, most likely related to chronic underlying hypertension. Electronically Signed   By: Jeannine Boga M.D.   On: 01/22/2018 19:22    Medications:  Scheduled: . carvedilol  25 mg Oral BID WC  . enoxaparin (LOVENOX) injection  40 mg Subcutaneous Q24H  . furosemide  40 mg Oral Daily  . insulin aspart  0-20 Units Subcutaneous Q4H  . pantoprazole  40 mg Oral Daily  . pravastatin  20 mg Oral q1800  .  spironolactone  25 mg Oral Daily  . verapamil  60 mg Oral TID   Continuous: . sodium chloride 250 mL (01/23/18 0502)  . methylPREDNISolone (SOLU-MEDROL) injection 1,000 mg (01/23/18 0402)    Assessment: 68 year old female with right optic neuritis. Presented with 6 to 7 days of painful progressive altitudinal vision loss OD.   -- Ophthalmology seen as an outpatient, sent to ER for further imaging and testing. -- Normal ESR and CRP. No temporal tenderness or history of jaw claudication. -- MRI brain and orbits revealed enhancement of right the optic nerve. Appearance is most consistent with retrobulbar optic neuritis. -- Also seen on MRI: Mild chronic small vessel ischemic disease for age. Multiple scattered chronic micro hemorrhages involving the right thalamus and pons, most likely related to chronic underlying hypertension.  -- Given her age, demyelination is somewhat less common as an etiology and other inflammatory causes such as lupus and sarcoid should also be evaluated. -Less likely ischemic optic neuropathy - will need outpatient ophthalmological evaluation and fluorescein angiogram as an outpatient.  Recommendations: -Check lupus panel -Check serum ACE level -Continue Solu-Medrol 1 g IV x 5 days -GI protection with Protonix while on steroids and monitor sugars closely while on steroids. -She will need outpatient neurology follow-up- and also might benefit from an outpatient spinal tap if deemed appropriate by the outpatient neurologist to look for oligoclonal bands and IgG index.  She might also benefit from visual evoked potentials which might help bolster the diagnosis of demyelination if P100 is abnormal. -Outpatient ophthalmology follow-up for possible fluorescein angiogram.  She might also benefit from a OCT as an outpatient-will defer the ophthalmology and outpatient neurology. -BP management given chronic microhemorrhages on MRI brain, which appear most likely to be secondary to prior episodes of acute on chronic HTN    LOS: 0 days   _0  signed: Dr. Kerney Elbe 01/23/2018  9:13 AM

## 2018-01-23 NOTE — Progress Notes (Signed)
   Subjective: Michelle Hurley states that her vision has improved since receiving her first dose of IV Solu-Medrol.  She did state that she had a headache at her occipital lobe yesterday.  She was given Tylenol which help resolve the headache.  She does state that she has had these headaches intermittently over the last several years and is concerned that it is related to her optic neuritis.  She is otherwise doing well.  Objective:  Vital signs in last 24 hours: Vitals:   01/23/18 0028 01/23/18 0329 01/23/18 0818 01/23/18 1135  BP: 120/74 121/66 110/80 112/72  Pulse: 69 63 73 78  Resp: 18  18 18   Temp: (!) 97.5 F (36.4 C) 97.8 F (36.6 C) 99 F (37.2 C) 97.6 F (36.4 C)  TempSrc: Oral Oral Oral Oral  SpO2: 100% 100% 98% 98%  Weight: 87.8 kg     Height: 5\' 2"  (1.575 m)      General: Alert, sitting up in bed in no acute distress Cardiovascular: Normal rate, regular rhythm Respiratory: Clear to auscultation bilaterally, no respiratory distress Neurologic: Left eye- visual field intact in all 4 quadrants, right eye- upper half of visual field missing.  Extraocular movements intact.  Face symmetric.  Upper and lower extremities 5 out of 5 strength.  Normal sensation to face, upper and lower extremities. MSK: No lower extremity edema or tenderness to palpation bilaterally.  Assessment/Plan:  Principal Problem:   Optic neuritis Active Problems:   Essential hypertension  Michelle Hurley presented with right monocular vision loss and found to have retrobulbar optic neuritis.  She is being treated with IV Solu-Medrol.  Additionally she is being worked up for secondary causes of her optic neuritis.  Differential diagnosis includes ischemic optic neuropathy (risk factors include HTN and DM) and autoimmune processes (sarcoidosis and lupus).  Temporal arteritis much less likely given normal sed rate and CRP. Multiple sclerosis is also unlikely given her advanced age.  Optic Neuritis: - Continue  Solu-Medrol 1 g x 5 days - Follow-up ANA, C3 complement, Anti-DNA Ab, ACE, Anti-Jo Ab, extractable nuclear antigen ab, HIV - She will likely undergo outpatient ophthalmology follow-up for possible fluorescein angiogram.  - SSI-resistant scale for potential steroid-induced hyperglycemia.  Hypertension: Well-controlled on home meds per below. - Continue Coreg 25 mg BID - Continue Spironolactone 25 mg QD - Continue Verapamil 60 mg TID - Continue Furosemide 40 mg QD  CKD Stage 3: Creatinine stable. Will continue to monitor.  DVT PPX: Lovenox Diet: Heart healthy Code: Full, confirmed with patient  Dispo: Anticipated discharge in 5-6 days after completion of IV Solu-Medrol   Carroll Sage, MD 01/23/2018, 11:54 AM Pager: [phone]

## 2018-01-23 NOTE — Progress Notes (Signed)
Internal Medicine Clinic Attending  I saw and evaluated the patient.  I personally confirmed the key portions of the history and exam documented by Dr. Myrtie Hawk and I reviewed pertinent patient test results.  The assessment, diagnosis, and plan were formulated together and I agree with the documentation in the resident's note.  Recent onset monocular vision loss of superior visual field of right eye, concerning for intraocular or optic nerve problem, including emergent issues such as vascular occlusion, retinal detachment. Dr. Myrtie Hawk arranged for urgent evaluation by ophthalmology this afternoon, patient has a ride to their office, may need further evaluation with MRI if no intraocular cause is found.   Lenice Pressman, M.D., Ph.D.

## 2018-01-23 NOTE — ED Notes (Signed)
Admitting MDs at bedside.

## 2018-01-24 DIAGNOSIS — R739 Hyperglycemia, unspecified: Secondary | ICD-10-CM

## 2018-01-24 LAB — BASIC METABOLIC PANEL
Anion gap: 10 (ref 5–15)
BUN: 21 mg/dL (ref 8–23)
CO2: 21 mmol/L — ABNORMAL LOW (ref 22–32)
CREATININE: 1.15 mg/dL — AB (ref 0.44–1.00)
Calcium: 9.2 mg/dL (ref 8.9–10.3)
Chloride: 106 mmol/L (ref 98–111)
GFR calc Af Amer: 56 mL/min — ABNORMAL LOW (ref >60)
GFR calc non Af Amer: 49 mL/min — ABNORMAL LOW (ref >60)
Glucose, Bld: 145 mg/dL — ABNORMAL HIGH (ref 70–99)
Potassium: 3.8 mmol/L (ref 3.5–5.1)
Sodium: 137 mmol/L (ref 135–145)

## 2018-01-24 LAB — GLUCOSE, CAPILLARY
GLUCOSE-CAPILLARY: 150 mg/dL — AB (ref 70–99)
GLUCOSE-CAPILLARY: 140 mg/dL — AB (ref 70–99)
Glucose-Capillary: 124 mg/dL — ABNORMAL HIGH (ref 70–99)
Glucose-Capillary: 130 mg/dL — ABNORMAL HIGH (ref 70–99)
Glucose-Capillary: 154 mg/dL — ABNORMAL HIGH (ref 70–99)
Glucose-Capillary: 188 mg/dL — ABNORMAL HIGH (ref 70–99)
Glucose-Capillary: 191 mg/dL — ABNORMAL HIGH (ref 70–99)

## 2018-01-24 LAB — ANTI-DNA ANTIBODY, DOUBLE-STRANDED

## 2018-01-24 LAB — EXTRACTABLE NUCLEAR ANTIGEN ANTIBODY
ENA SM Ab Ser-aCnc: <0.2 AI (ref 0.0–0.9)
Ribonucleic Protein: <0.2 AI (ref 0.0–0.9)
SSA (Ro) (ENA) Antibody, IgG: <0.2 AI (ref 0.0–0.9)
SSB (La) (ENA) Antibody, IgG: <0.2 AI (ref 0.0–0.9)
ds DNA Ab: <1 IU/mL (ref 0–9)

## 2018-01-24 LAB — ANA: Anti Nuclear Antibody(ANA): NEGATIVE

## 2018-01-24 LAB — ANGIOTENSIN CONVERTING ENZYME: Angiotensin-Converting Enzyme: 40 U/L (ref 14–82)

## 2018-01-24 LAB — C3 COMPLEMENT: C3 Complement: 141 mg/dL (ref 82–167)

## 2018-01-24 LAB — ANTI-JO 1 ANTIBODY, IGG: Anti JO-1: <0.2 AI (ref 0.0–0.9)

## 2018-01-24 NOTE — Plan of Care (Signed)
Patient stable, discussed POC with patient, agreeable with plan, IV steroids tolerated well, denies question/concerns at this time.

## 2018-01-24 NOTE — Progress Notes (Signed)
   Subjective: Ms. Locust does believe that the vision in her right eye has improved.  She states that she is able to see more of her upper visual field.  She does state that she is having very strange "snowy like flakes and psychedelic colors" in her right eye vision since yesterday at 11 PM.  She states that this is new for her.  She denies any recent headaches.  Objective:  Vital signs in last 24 hours: Vitals:   01/24/18 0003 01/24/18 0531 01/24/18 0758 01/24/18 1131  BP: 130/82 132/85 (!) 147/85 (!) (P) 144/89  Pulse: 73 70 69 (P) 63  Resp: 20 20 20  (P) 20  Temp: 98 F (36.7 C) 97.7 F (36.5 C) 97.7 F (36.5 C) (P) 97.7 F (36.5 C)  TempSrc: Oral Oral Oral (P) Oral  SpO2: 100% 96% 100% (P) 98%  Weight:      Height:       General: Alert sitting up in bed in no acute distress Cardiovascular: Normal rate, regular rhythm Respiratory: clear to auscultation, bilaterally no respiratory distress Neuro, left eye-vision intact in all 4 quadrants, right eye- upper three quarters of visual field missing (much improved from yesterday).  Extraocular movements intact.  Face is symmetric.  Upper and lower extremities 5 out of 5 strength.    Assessment/Plan:  Principal Problem:   Optic neuritis Active Problems:   Essential hypertension   Loss of part of visual field  Ms. Blackwell presented with right monocular vision loss and found to have a retrobulbar optic neuritis.  She is currently being treated with IV Solu-Medrol (day 2/5).  We continue to work-up for secondary causes of her optic neuritis.  Her vision loss has improved since beginning steroid treatment.  She does have some new visual changes of the right eye of unclear etiology.  Will follow up with her outpatient ophthalmologist Dr. Katy Fitch for recommendations.  Optic neuritis: Working her up for secondary causes of optic neuritis.  Differential includes autoimmune disorders including lupus and sarcoidosis (Sed rate and CRP WNL, C3  complement WNL, ACE WNL, ANA, anti-DNA pending), multiple sclerosis (less likely because of advanced age but neurology recommends potential outpatient spinal tap to look for oligoclonal bands and IgG index), and ischemic optic neuropathy. - Continue Solu-Medrol 1 g for total 5 days - Follow-up ANA, Anti-DNA ab, Anti-Jo Ab, extractable nuclear antigen ab - She will likely undergo outpatient ophthalmology follow-up for possible fluorescein angiogram.  - Follow-up with Dr. Katy Fitch per above  Steroid-induced hyperglycemia: Elevated BS's elevated but within acceptable limits - SSI-resistant scale  Hypertension: Elevated but WNL on home meds per below. - Continue Coreg 25 mg BID - Continue Spironolactone 25 mg QD - Continue Verapamil 60 mg TID - Continue Furosemide 40 mg QD  CKD Stage 3: Creatinine stable. Will continue to monitor.  DVT PPX: Lovenox Diet: Heart healthy Code: Full, confirmed with patient  Dispo: Anticipated discharge after completion of prednisone course.   Carroll Sage, MD 01/24/2018, 1:14 PM Pager: [phone]

## 2018-01-25 LAB — URINE CULTURE: Culture: >=100000 — AB

## 2018-01-25 LAB — GLUCOSE, CAPILLARY
Glucose-Capillary: 147 mg/dL — ABNORMAL HIGH (ref 70–99)
Glucose-Capillary: 137 mg/dL — ABNORMAL HIGH (ref 70–99)
Glucose-Capillary: 129 mg/dL — ABNORMAL HIGH (ref 70–99)
Glucose-Capillary: 142 mg/dL — ABNORMAL HIGH (ref 70–99)
Glucose-Capillary: 188 mg/dL — ABNORMAL HIGH (ref 70–99)

## 2018-01-25 LAB — BASIC METABOLIC PANEL
Anion gap: 10 (ref 5–15)
BUN: 23 mg/dL (ref 8–23)
CHLORIDE: 106 mmol/L (ref 98–111)
CO2: 21 mmol/L — ABNORMAL LOW (ref 22–32)
Calcium: 8.8 mg/dL — ABNORMAL LOW (ref 8.9–10.3)
Creatinine, Ser: 1.23 mg/dL — ABNORMAL HIGH (ref 0.44–1.00)
GFR calc Af Amer: 52 mL/min — ABNORMAL LOW (ref >60)
GFR calc non Af Amer: 45 mL/min — ABNORMAL LOW (ref >60)
Glucose, Bld: 166 mg/dL — ABNORMAL HIGH (ref 70–99)
Potassium: 3.5 mmol/L (ref 3.5–5.1)
Sodium: 137 mmol/L (ref 135–145)

## 2018-01-25 NOTE — Progress Notes (Signed)
   Subjective: Michelle Hurley states that her vision does seem to be a little better this morning. She is able to see a bit for vertically. She denies any other visual disturbances. She also says that she was very constipated this morning with abdominal pain. She had some coffee and had 2 bowel movements. She denies any current abdominal pain.   Objective:  Vital signs in last 24 hours: Vitals:   01/24/18 2322 01/25/18 0317 01/25/18 0828 01/25/18 1131  BP: 134/72 128/76 137/80 135/78  Pulse: 71 69 63 62  Resp: 17 17 18 19   Temp: 97.9 F (36.6 C) 97.6 F (36.4 C) 97.6 F (36.4 C) (!) 97.4 F (36.3 C)  TempSrc: Oral Oral Oral Oral  SpO2: 100% 100% 95% 94%  Weight:  91 kg    Height:       General: Sitting up in bed in no acute distress. CV: Normal rate, regular rhythm Resp: CTAB, normal WOB Neuro: Left eye-vision in tact in all 4 quadrants, right eye- upper 2/4 of visual field missing (similar exam to yesterday). EOMI. Face symmetric. 5/5 strength in UE/LE bilaterally.   Assessment/Plan:  Principal Problem:   Optic neuritis Active Problems:   Essential hypertension   Loss of part of visual field  Michelle Hurley presented with right monocular vision loss and found to have a retrobulbar optic neuritis.  She is currently being treated with IV Solu-Medrol (day 3/5).  We continue to work-up for secondary causes of her optic neuritis.  Her vision loss has improved somewhat since admission but it is difficult to determine how much vision can be salvaged. I spoke with Dr. Katy Hurley about her "snow cloud" figures in her right vision. He was not concerned and aill arrange outpatient follow-up in the next several weeks.   Optic neuritis: Working her up for secondary causes of optic neuritis. Autoimmune disorders including lupus and sarcoidosis unlikely given normal immunologic tests. Multiple sclerosis less likely because of advanced age. Will touch base with neurology prior to discharge to determine  whether she does in fact need an outpatient spinal tap to look for oligoclonal bands and IgG index. Ischemic optic neuropathy should also be considered in a patient with multiple risk factors (obesity, HTN). - Continue Solu-Medrol 1 g for total 5 days - She will likely undergo outpatient ophthalmology follow-up for possible fluorescein angiogram.   Steroid-induced hyperglycemia: Blood sugars within acceptable limits on sliding scale insulin. - SSI-resistant scale  Hypertension: WNL on home meds - Continue Coreg 25 mg BID - Continue Spironolactone 25 mg QD - Continue Verapamil 60 mg TID - Continue Furosemide 40 mg QD  CKD Stage 3:Creatinine stable. Will continue to monitor.  DVT PPX: Lovenox Diet: Heart healthy Code: Full  Dispo: Anticipated discharge after completion of IV steroid 5 day course.  Michelle Sage, MD 01/25/2018, 11:55 AM Pager: [phone]

## 2018-01-25 NOTE — Care Management Important Message (Signed)
Important Message  Patient Details  Name: Michelle Hurley MRN: [id number] Date of Birth: 12/17/1950   Medicare Important Message Given:  Yes    Orbie Pyo 01/25/2018, 3:22 PM

## 2018-01-25 NOTE — Plan of Care (Signed)
Patient stable, discussed POC with patient, agreeable with plan, IV steroids tolerated well, denies question/concerns at this time.

## 2018-01-25 NOTE — Care Management Note (Signed)
Case Management Note  Patient Details  Name: Michelle Hurley MRN: [id number] Date of Birth: [birth year]  Subjective/Objective:    Pt admitted with optic neuritis.  She is from senior apartment at [facility].  DME: none No issues obtaining her meds. She receives meds through the mail from [place]. No issues with transportation. She drives.               Action/Plan: Plan is for her to return home when medically ready for d/c.  Pt's family should be able to provide transport and will be able to check on patient once home.  CM following for d/c needs, physician orders.    Expected Discharge Date:                  Expected Discharge Plan:  Home/Self Care  In-House Referral:     Discharge planning Services     Post Acute Care Choice:    Choice offered to:     DME Arranged:    DME Agency:     HH Arranged:    HH Agency:     Status of Service:  In process, will continue to follow  If discussed at Long Length of Stay Meetings, dates discussed:    Additional Comments:  Pollie Friar, RN 01/25/2018, 12:23 PM

## 2018-01-26 LAB — BASIC METABOLIC PANEL
Anion gap: 13 (ref 5–15)
BUN: 24 mg/dL — ABNORMAL HIGH (ref 8–23)
CO2: 21 mmol/L — ABNORMAL LOW (ref 22–32)
Calcium: 8.6 mg/dL — ABNORMAL LOW (ref 8.9–10.3)
Chloride: 103 mmol/L (ref 98–111)
Creatinine, Ser: 1.28 mg/dL — ABNORMAL HIGH (ref 0.44–1.00)
GFR calc Af Amer: 49 mL/min — ABNORMAL LOW (ref >60)
GFR calc non Af Amer: 43 mL/min — ABNORMAL LOW (ref >60)
GLUCOSE: 174 mg/dL — AB (ref 70–99)
Potassium: 3.4 mmol/L — ABNORMAL LOW (ref 3.5–5.1)
Sodium: 137 mmol/L (ref 135–145)

## 2018-01-26 LAB — GLUCOSE, CAPILLARY
Glucose-Capillary: 126 mg/dL — ABNORMAL HIGH (ref 70–99)
Glucose-Capillary: 136 mg/dL — ABNORMAL HIGH (ref 70–99)
Glucose-Capillary: 140 mg/dL — ABNORMAL HIGH (ref 70–99)
Glucose-Capillary: 163 mg/dL — ABNORMAL HIGH (ref 70–99)
Glucose-Capillary: 189 mg/dL — ABNORMAL HIGH (ref 70–99)
Glucose-Capillary: 220 mg/dL — ABNORMAL HIGH (ref 70–99)
Glucose-Capillary: 292 mg/dL — ABNORMAL HIGH (ref 70–99)

## 2018-01-26 MED ORDER — PSYLLIUM 95 % PO PACK
1.0000 | PACK | Freq: Every day | ORAL | Status: DC | PRN
  Administered 2018-01-26: 1 via ORAL
  Filled 2018-01-26 (×2): qty 1

## 2018-01-26 MED ORDER — POTASSIUM CHLORIDE CRYS ER 20 MEQ PO TBCR
40.0000 meq | EXTENDED_RELEASE_TABLET | Freq: Once | ORAL | Status: AC
  Administered 2018-01-26: 40 meq via ORAL
  Filled 2018-01-26: qty 2

## 2018-01-26 NOTE — Progress Notes (Signed)
Pt stating that when she looks at the floor that she sees everything normal, but when she looks at her arms it appears to look as if her skin is going inwards.

## 2018-01-26 NOTE — Progress Notes (Signed)
Medicine attending:  I examined this patient today together with resident physician Dr. Isac Sarna and I concur with her evaluation and management plan. 68 year old woman admitted on January 15 with acute loss in vision referred by her ophthalmologist and found to have optic neuritis affecting the right eye. She is currently midcourse high-dose pulse steroids day 4 of 5. She has had significant improvement in her vision.  She is getting some fluctuating floaters and color changes but has good light perception.  She was able to identify my fingers if I put my hand close to her face.  There do not appear to be any deficits in peripheral vision by visual field testing by gross confrontation. Neuro immunology evaluation negative to date. Continue current management.

## 2018-01-26 NOTE — Progress Notes (Signed)
   Subjective:   No acute events overnight.  Michelle Hurley reports doing well this morning.  She continues to endorse "psychedelic colors" in her R eye, but states her vision overall continue to improve. Her only complaint was poor appetite because she does not like the hospital food.  Objective:  Vital signs in last 24 hours: Vitals:   01/25/18 1554 01/25/18 1959 01/25/18 2335 01/26/18 0359  BP: (!) 126/95 140/71 115/61 134/83  Pulse: 64 64 65 62  Resp:  17 17 16   Temp: 97.6 F (36.4 C) 97.7 F (36.5 C) (!) 97.5 F (36.4 C) 97.9 F (36.6 C)  TempSrc: Oral Oral Oral Oral  SpO2: 99% 99% 97% 100%  Weight:    90.3 kg  Height:       General: Well-appearing female in no acute distress CV: RRR, no mrg  Resp: Appears comfortable in room air Neuro:  R eye- able to see in 4/4 quadrants. EOMI. L eye vision is intact.   Assessment/Plan:  Principal Problem:   Optic neuritis Active Problems:   Essential hypertension   Loss of part of visual field  Michelle Hurley presented with right monocular vision loss and found to have a retrobulbar optic neuritis.  She is currently being treated with IV Solu-Medrol (day 4/5).   # Optic neuritis: Michelle Hurley continues to improve while on IV steroids.  Work-up was unremarkable.  Neurology has recommended possible LP as an outpatient to check for oligoclonal bands and IgG index as well as ophthalmology outpatient follow-up for visual evoked potentials and fluorescein angiogram. - Continue Solu-Medrol 1 g for total 5 days (Day 4) + PO Protonix - She will likely undergo outpatient ophthalmology follow-up for possible fluorescein angiogram.   # Steroid-induced hyperglycemia: Blood sugars WNL on sliding scale insulin. - SSI-resistant scale  # Hypertension: WNL on home meds - Continue Coreg 25 mg BID - Continue Spironolactone 25 mg QD - Continue Verapamil 60 mg TID - Continue Furosemide 40 mg QD  # CKD Stage 3:Creatinine stable. Will continue to  monitor.  DVT PPX: Lovenox Diet: Heart healthy Code: Full  Dispo: Anticipated discharge after completion of IV steroid 5 day course.  Welford Roche, MD 01/26/2018, 5:48 AM Pager: [phone]

## 2018-01-27 ENCOUNTER — Other Ambulatory Visit: Payer: Self-pay

## 2018-01-27 DIAGNOSIS — H5340 Unspecified visual field defects: Secondary | ICD-10-CM

## 2018-01-27 DIAGNOSIS — I10 Essential (primary) hypertension: Secondary | ICD-10-CM

## 2018-01-27 LAB — BASIC METABOLIC PANEL
Anion gap: 11 (ref 5–15)
BUN: 27 mg/dL — ABNORMAL HIGH (ref 8–23)
CALCIUM: 8.1 mg/dL — AB (ref 8.9–10.3)
CO2: 21 mmol/L — ABNORMAL LOW (ref 22–32)
Chloride: 104 mmol/L (ref 98–111)
Creatinine, Ser: 1.26 mg/dL — ABNORMAL HIGH (ref 0.44–1.00)
GFR calc Af Amer: 50 mL/min — ABNORMAL LOW (ref >60)
GFR calc non Af Amer: 43 mL/min — ABNORMAL LOW (ref >60)
Glucose, Bld: 155 mg/dL — ABNORMAL HIGH (ref 70–99)
Potassium: 3.3 mmol/L — ABNORMAL LOW (ref 3.5–5.1)
SODIUM: 136 mmol/L (ref 135–145)

## 2018-01-27 LAB — GLUCOSE, CAPILLARY
GLUCOSE-CAPILLARY: 169 mg/dL — AB (ref 70–99)
Glucose-Capillary: 128 mg/dL — ABNORMAL HIGH (ref 70–99)
Glucose-Capillary: 179 mg/dL — ABNORMAL HIGH (ref 70–99)

## 2018-01-27 MED ORDER — POTASSIUM CHLORIDE CRYS ER 20 MEQ PO TBCR
40.0000 meq | EXTENDED_RELEASE_TABLET | Freq: Once | ORAL | Status: AC
  Administered 2018-01-27: 40 meq via ORAL
  Filled 2018-01-27: qty 2

## 2018-01-27 NOTE — Discharge Summary (Signed)
Name: Michelle Hurley MRN: [id number] DOB: 11/04/1950 68 y.o. PCP: Jean Rosenthal, MD  Date of Admission: 01/22/2018  3:39 PM Date of Discharge: 01/27/2018 Attending Physician: Murriel Hopper MD  Discharge Diagnosis: 1.  Optic neuritis 2.  Hypertension 3.  Steroid-induced hyperglycemia 4.  CKD stage III  5.  Hypokalemia  Discharge Medications: Allergies as of 01/27/2018      Reactions   Mushroom Extract Complex Anaphylaxis, Itching, Swelling   Swelling all over body    Codeine Nausea And Vomiting   Hydrocodone Nausea And Vomiting   Codeine Nausea And Vomiting   Prednisone Nausea And Vomiting      Medication List    STOP taking these medications   ivermectin 3 MG Tabs tablet Commonly known as:  STROMECTOL     TAKE these medications   acetaminophen 325 MG tablet Commonly known as:  TYLENOL Take 2 tablets (650 mg total) by mouth every 6 (six) hours as needed for moderate pain, fever or headache.   carvedilol 25 MG tablet Commonly known as:  COREG Take 1 tablet (25 mg total) by mouth 2 (two) times daily with a meal.   cetirizine 1 MG/ML syrup Commonly known as:  ZYRTEC Take 10 mLs (10 mg total) by mouth daily.   clobetasol cream 0.05 % Commonly known as:  TEMOVATE Apply 1 application topically 2 (two) times daily.   diclofenac sodium 1 % Gel Commonly known as:  VOLTAREN Apply 2 g topically 4 (four) times daily.   fluticasone 50 MCG/ACT nasal spray Commonly known as:  FLONASE Place 2 sprays into both nostrils daily.   furosemide 40 MG tablet Commonly known as:  LASIX Take 1 tablet (40 mg total) by mouth daily.   gabapentin 300 MG capsule Commonly known as:  NEURONTIN Take 1 capsule (300 mg total) by mouth 2 (two) times daily. Take 1 tablet in the morning and 1 tablet at night.   guaiFENesin-dextromethorphan 100-10 MG/5ML syrup Commonly known as:  ROBITUSSIN DM Take 5 mLs by mouth every 4 (four) hours as needed for cough.   loratadine 10 MG  tablet Commonly known as:  CLARITIN Take 1 tablet (10 mg total) by mouth daily.   lovastatin 20 MG tablet Commonly known as:  MEVACOR TAKE 1 TABLET AT BEDTIME   naproxen 375 MG tablet Commonly known as:  NAPROSYN Take 1 tablet (375 mg total) by mouth 2 (two) times daily with a meal.   omeprazole 20 MG capsule Commonly known as:  PRILOSEC Take 1 capsule (20 mg total) by mouth daily for 28 days.   ondansetron 4 MG tablet Commonly known as:  ZOFRAN Take 1 tablet (4 mg total) by mouth every 8 (eight) hours as needed for nausea or vomiting.   polyethylene glycol powder powder Commonly known as:  GLYCOLAX/MIRALAX Take 17 g by mouth daily as needed for moderate constipation or severe constipation.   scopolamine 1 MG/3DAYS Commonly known as:  TRANSDERM-SCOP (1.5 MG) Place 1 patch (1.5 mg total) onto the skin every 3 (three) days as needed.   senna 8.6 MG Tabs tablet Commonly known as:  SENOKOT Take 2 tablets (17.2 mg total) by mouth at bedtime as needed for mild constipation.   spironolactone 25 MG tablet Commonly known as:  ALDACTONE Take 1 tablet (25 mg total) by mouth daily.   verapamil 120 MG tablet Commonly known as:  CALAN TAKE 1/2 TABLET THREE TIMES DAILY What changed:  See the new instructions.       Disposition  and follow-up:   [name] was discharged from [facility] in Stable condition.  At the hospital follow up visit please address:  1.  Please reevaluate her visual fields for any significant changes.  2.  Labs / imaging needed at time of follow-up: BMP  3.  Pending labs/ test needing follow-up: None  Follow-up Appointments: I will call tomorrow morning to make follow-up appointments with neurology and our internal medicine clinic.  Hospital Course by problem list: 1.  Optic neuritis: She presented to the ED with 7 days of progressive pain in her right eye associated with blurring vision.  She was seen by her PCP the day prior  to admission when she noticed a black spot in the upper half of her right eye and was urgently referred to ophthalmology who evaluated her.  She was then sent to the ED for MRI which showed right retro-bulbar optic neuritis.  She completed a 5-day course of IV Solu-Medrol during her admission.  Prior to her discharge her vision had significantly improved.  She states that it was formally like a " half of left eye" missing from the vertical half of her visual fields and that the portion of missing vision has come back.  She is now seeing "all gray now" at the affected visual field quadrants. She denies any pain with eye movement.  Neurology has recommended possible lumbar puncture as an outpatient to check for oligoclonal bands and IgG index as well as ophthalmology outpatient follow-up for visual evoked potentials and fluorescein angiogram.  She will be discharged today and will follow up with both a neurologist as well as her PCP.   2.  Hypertension: Was well controlled on her home meds of Coreg, spironolactone, verapamil, and furosemide.   3.  Steroid-induced hyperglycemia: She had elevated blood sugars on sliding scale insulin.  Please recheck her blood sugar at hospital follow-up.  4.  CKD stage III: Remained stable throughout admission.  5.  Hypokalemia: Repleted as necessary.  Please recheck BMP at follow-up visit.  Discharge Vitals:   BP 124/69 (BP Location: Right Arm)   Pulse 72   Temp 98 F (36.7 C) (Oral)   Resp 20   Ht 5\' 2"  (1.575 m)   Wt 91.1 kg   LMP 04/05/2012   SpO2 98%   BMI 36.73 kg/m   Pertinent Labs, Studies, and Procedures:  1/14 MRI Brain/Orbits: IMPRESSION: 1. Abnormal enhancement involving the right optic nerve as above, suggesting acute optic neuritis. 2. No other acute intracranial abnormality. 3. Mild chronic small vessel ischemic disease for age. 4. Multiple scattered chronic micro hemorrhages involving the right thalamus and pons, most likely related to  chronic underlying Hypertension.  1/14 CXR: negative  Discharge Instructions: Discharge Instructions    Call MD for:   Complete by:  As directed    Worsening of your vision.   Diet - low sodium heart healthy   Complete by:  As directed    Discharge instructions   Complete by:  As directed    [name],  Thank you so much for allowing me to care for you during your admission. You were found to have optic neuritis (inflammation of the optic nerve) which was treated with IV steroids. I am very happy to hear that you have regained a significant amount of your sight back.   Your work-up for the underlying cause of the optic neuritis have all come back negative. I will make sure to make an appointment  with the Neurologist as well as our clinic tomorrow. I will call to check up on you as well and to notify you of these upcoming appointments. If something comes up before I call you, please call our clinic to make an appointment as soon as possible.  - Dr. Alfonse Spruce   Increase activity slowly   Complete by:  As directed       Signed: Carroll Sage, MD 01/27/2018, 4:18 PM   Pager: [phone]

## 2018-01-27 NOTE — Progress Notes (Signed)
Medicine attending: Clinical status and lab database reviewed with resident physician Dr. Nita Sickle and I concur with her evaluation and management plan. Day 5 of 5 high-dose steroids for right optic neuritis.  Idiopathic.  Significant improvement in vision on the steroids.  Waiting for final recommendations from neurology.  Anticipate discharge later today or tomorrow.  Follow-up with ophthalmology and neurology.

## 2018-01-27 NOTE — Progress Notes (Addendum)
   Subjective: Michelle Hurley states that her vision has improved this morning. She is no longer "seeing black, but more of a grey color which she can still distinguish figures" in her most vertical vision. She did have an odd visual disturbance this morning where she felt as though her skin "had a dent in it". She did not see this when she looked at other surfaces. She is not having this visual disturbance now.  Objective:  Vital signs in last 24 hours: Vitals:   01/26/18 1918 01/26/18 2100 01/26/18 2347 01/27/18 0358  BP: (!) 118/104 120/80 130/73 110/72  Pulse: 68 69 65 69  Resp: 17 18 17 20   Temp: (!) 97.2 F (36.2 C) 97.9 F (36.6 C) 97.9 F (36.6 C) 98.1 F (36.7 C)  TempSrc: Oral Oral Oral Oral  SpO2: 100% 99% 100% 95%  Weight:    91.1 kg  Height:       General: Sitting up in her chair, in no acute distress. CV: Normal rate, regular rhythm Resp: CTAB Neuro: Right eye- All 4 visual field quadrants in tact. Left eye- All 4 visual field quadrants in tact. Normal sensation. 5/5 upper and lower extremity strength.  Assessment/Plan:  Principal Problem:   Optic neuritis Active Problems:   Essential hypertension   Loss of part of visual field  Ms. Schweiger presented with right monocular vision loss and found to have a retrobulbar optic neuritis. She will complete a 5 day course of IV Solu-Medrol today.  # Optic neuritis: Ms. Shader vision is close to baseline with IV steroid treatment. Work-up was unremarkable.  Neurology has recommended possible LP as an outpatient to check for oligoclonal bands and IgG index as well as ophthalmology outpatient follow-up for visual evoked potentials and fluorescein angiogram. Neurology will see Ms. Peedin today.  - Continue Solu-Medrol 1 g for total 5 days (Day 5) + PO Protonix - Follow up neurology recommendations  # Steroid-induced hyperglycemia:Blood sugars WNL on sliding scale insulin. -SSI-resistant scale  # Hypertension: WNL on  home meds - Continue Coreg 25 mg BID - Continue Spironolactone 25 mg QD - Continue Verapamil 60 mg TID - Continue Furosemide 40 mg QD  # Hypokalemia: 3.3 today. Will replete.  # CKD Stage 3:Remained stable throughout admission.  DVT PPX: Lovenox Diet: Heart healthy Code: Full  Dispo: Anticipated discharge in 0-1 days.  Carroll Sage, MD 01/27/2018, 7:06 AM Pager: [phone]

## 2018-01-27 NOTE — Progress Notes (Addendum)
Called her daughter  Michelle Hurley as promised to the patient Michelle Hurley  # [phone]- not in the chart, retrieved by the RN ALA from patient.  Spoke to her daughter this morning to explain and recapitulate all that was said to her mother. She denies any further questions.   [id number]

## 2018-01-27 NOTE — Progress Notes (Signed)
Pt given discharge summary and was discharged via family transportation.

## 2018-01-27 NOTE — Progress Notes (Addendum)
Subjective: There has been some improvement to the patient's altitudinal right monocular visual field defect since first dose of IV Solumedrol. She states that formerly it was like "half of a pie" missing from the vertical half of her visual fields and that a portion of the missing vision has come back. The portions still affected have no vision - "all grey now". Eye pain with movement is now resolved.  Apparently Ms. Denapoli felt as though she was treated but was unaware of the reason and expectations for follow up treatment. She admits that there was explanation of her disease process from the neurology team, but she needed better understanding s/p receiving IV steroid treatment. She has completed 4/5 treatment to this point and will receive her last treatment as of today. She is not comfortable initially with d/c tomorrow. There was a significant amount of time taken with Ms. Gaugh today discussing possible etiology of optic neuritis, treatment and follow up. We have outlined that we understand the significance of her sight and have treated her with high dose steroids as the 1st mode of treatment. She states that she has about 85% of her baseline vision back. She will be collaboratively seen by outpatient neurology and ophthalmology for future evaluation. I explained that she is stable and unable to stay in the hospital after treatment first because her risk outweigh her benefits as well as guidelines needing to be followed.   She is very set and determined to be seen no longer than 3 days after discharge for neurology follow up. I explained that I am unable to promise this but would follow up with those in charge of doing so. She states that she is independent and has help but will need early confirmation that this situation is being monitored ongoing "since it's her eyes." I reiterated that she cannot drive, until cleared by neurology as this could cause danger to her or others. She is aware and agrees.  We discussed images,  blood pressure control, with microhemorages within the right pons, thalamus and the sequelae of uncontrolled blood pressures. She denies any knowledge autoimmune diseases but is currently being r/o for lupus. Overall she appears comfortable leaving tomorrow at this point. She denied having additional questions upon me leaving the room.         Objective: Current vital signs: BP (!) 149/96 (BP Location: Right Arm)   Pulse 71   Temp 98 F (36.7 C) (Oral)   Resp 20   Ht _0  (1.575 m)   Wt 91.1 kg   LMP 04/05/2012   SpO2 99%   BMI 36.73 kg/m  Vital signs in last 24 hours: Temp:  [97.2 F (36.2 C)-98.1 F (36.7 C)] 98 F (36.7 C) (01/19 0824) Pulse Rate:  [61-71] 71 (01/19 0824) Resp:  [17-20] 20 (01/19 0824) BP: (110-149)/(72-104) 149/96 (01/19 0824) SpO2:  [95 %-100 %] 99 % (01/19 0824) Weight:  [91.1 kg] 91.1 kg (01/19 0358)  Intake/Output from previous day: 01/18 0701 - 01/19 0700 In: 1211 [P.O.:920; IV Piggyback:291] Out: -  Intake/Output this shift: No intake/output data recorded. Nutritional status:  Diet Order            Diet regular Room service appropriate? Yes; Fluid consistency: Thin  Diet effective now             HEENT: [place]/AT Lungs: Respirations unlabored Ext: Warm and well perfused  Neurologic Exam: Ment: Alert and oriented. Speech fluent. Intact to complex questions and commands. CN: PERRL without RAPD.  Right eye vision with loss of vision involving the upper half of her visual fields OD.  Left eye with visual fields intact all 4 quadrants. EOMI without pain elicited by movement. No nystagmus.  Face symmetric. Tongue midline. Motor: 5/5 x 4 Sensory: Normal response to tactile stimulation   Lab Results: Results for orders placed or performed during the hospital encounter of 01/22/18 (from the past 48 hour(s))  Glucose, capillary     Status: Abnormal   Collection Time: 01/25/18 11:31 AM  Result Value Ref Range    Glucose-Capillary 129 (H) 70 - 99 mg/dL  Glucose, capillary     Status: Abnormal   Collection Time: 01/25/18  5:12 PM  Result Value Ref Range   Glucose-Capillary 142 (H) 70 - 99 mg/dL  Glucose, capillary     Status: Abnormal   Collection Time: 01/25/18  7:57 PM  Result Value Ref Range   Glucose-Capillary 188 (H) 70 - 99 mg/dL   Comment 1 Notify RN    Comment 2 Document in Chart   Glucose, capillary     Status: Abnormal   Collection Time: 01/25/18 11:34 PM  Result Value Ref Range   Glucose-Capillary 220 (H) 70 - 99 mg/dL   Comment 1 Notify RN    Comment 2 Document in Chart   Glucose, capillary     Status: Abnormal   Collection Time: 01/26/18  3:58 AM  Result Value Ref Range   Glucose-Capillary 126 (H) 70 - 99 mg/dL   Comment 1 Notify RN    Comment 2 Document in Chart   Basic metabolic panel     Status: Abnormal   Collection Time: 01/26/18  5:30 AM  Result Value Ref Range   Sodium 137 135 - 145 mmol/L   Potassium 3.4 (L) 3.5 - 5.1 mmol/L   Chloride 103 98 - 111 mmol/L   CO2 21 (L) 22 - 32 mmol/L   Glucose, Bld 174 (H) 70 - 99 mg/dL   BUN 24 (H) 8 - 23 mg/dL   Creatinine, Ser 1.28 (H) 0.44 - 1.00 mg/dL   Calcium 8.6 (L) 8.9 - 10.3 mg/dL   GFR calc non Af Amer 43 (L) >60 mL/min   GFR calc Af Amer 49 (L) >60 mL/min   Anion gap 13 5 - 15    Comment: Performed at [facility], [address], [place], [place] [address]  Glucose, capillary     Status: Abnormal   Collection Time: 01/26/18  7:32 AM  Result Value Ref Range   Glucose-Capillary 136 (H) 70 - 99 mg/dL  Glucose, capillary     Status: Abnormal   Collection Time: 01/26/18 11:28 AM  Result Value Ref Range   Glucose-Capillary 163 (H) 70 - 99 mg/dL  Glucose, capillary     Status: Abnormal   Collection Time: 01/26/18  4:19 PM  Result Value Ref Range   Glucose-Capillary 189 (H) 70 - 99 mg/dL  Glucose, capillary     Status: Abnormal   Collection Time: 01/26/18  7:17 PM  Result Value Ref Range   Glucose-Capillary  292 (H) 70 - 99 mg/dL   Comment 1 Notify RN    Comment 2 Document in Chart   Glucose, capillary     Status: Abnormal   Collection Time: 01/26/18 11:48 PM  Result Value Ref Range   Glucose-Capillary 140 (H) 70 - 99 mg/dL   Comment 1 Notify RN    Comment 2 Document in Chart   Glucose, capillary  Status: Abnormal   Collection Time: 01/27/18  4:02 AM  Result Value Ref Range   Glucose-Capillary 169 (H) 70 - 99 mg/dL   Comment 1 Notify RN    Comment 2 Document in Chart   Basic metabolic panel     Status: Abnormal   Collection Time: 01/27/18  7:00 AM  Result Value Ref Range   Sodium 136 135 - 145 mmol/L   Potassium 3.3 (L) 3.5 - 5.1 mmol/L   Chloride 104 98 - 111 mmol/L   CO2 21 (L) 22 - 32 mmol/L   Glucose, Bld 155 (H) 70 - 99 mg/dL   BUN 27 (H) 8 - 23 mg/dL   Creatinine, Ser 1.26 (H) 0.44 - 1.00 mg/dL   Calcium 8.1 (L) 8.9 - 10.3 mg/dL   GFR calc non Af Amer 43 (L) >60 mL/min   GFR calc Af Amer 50 (L) >60 mL/min   Anion gap 11 5 - 15    Comment: Performed at [facility] [address], [place], [place] [address]  Glucose, capillary     Status: Abnormal   Collection Time: 01/27/18  8:20 AM  Result Value Ref Range   Glucose-Capillary 128 (H) 70 - 99 mg/dL    Recent Results (from the past 240 hour(s))  Culture, Urine     Status: Abnormal   Collection Time: 01/23/18  4:47 AM  Result Value Ref Range Status   Specimen Description URINE, RANDOM  Final   Special Requests   Final    NONE Performed at [facility], [place] [address] [place], [place] [address]    Culture >=100,000 COLONIES/mL ESCHERICHIA COLI (A)  Final   Report Status 01/25/2018 FINAL  Final   Organism ID, Bacteria ESCHERICHIA COLI (A)  Final      Susceptibility   Escherichia coli - MIC*    AMPICILLIN 4 SENSITIVE Sensitive     CEFAZOLIN <=4 SENSITIVE Sensitive     CEFTRIAXONE <=1 SENSITIVE Sensitive     CIPROFLOXACIN <=0.25 SENSITIVE Sensitive     GENTAMICIN <=1 SENSITIVE Sensitive     IMIPENEM  <=0.25 SENSITIVE Sensitive     NITROFURANTOIN <=16 SENSITIVE Sensitive     TRIMETH/SULFA <=20 SENSITIVE Sensitive     AMPICILLIN/SULBACTAM <=2 SENSITIVE Sensitive     PIP/TAZO <=4 SENSITIVE Sensitive     Extended ESBL NEGATIVE Sensitive     * >=100,000 COLONIES/mL ESCHERICHIA COLI    Lipid Panel No results for input(s): CHOL, TRIG, HDL, CHOLHDL, VLDL, LDLCALC in the last 72 hours.  Studies/Results: No results found.  Medications:  Scheduled: . carvedilol  25 mg Oral BID WC  . enoxaparin (LOVENOX) injection  40 mg Subcutaneous Q24H  . furosemide  40 mg Oral Daily  . insulin aspart  0-20 Units Subcutaneous Q4H  . loratadine  10 mg Oral Daily  . pantoprazole  40 mg Oral Daily  . potassium chloride  40 mEq Oral Once  . pravastatin  20 mg Oral q1800  . spironolactone  25 mg Oral Daily  . verapamil  60 mg Oral TID   Continuous: . sodium chloride 10 mL/hr at 01/23/18 0504  . methylPREDNISolone (SOLU-MEDROL) injection Stopped (01/26/18 1021)   Assessment: 68 year old female with right optic neuritis. Presented with 6 to 7 days of painful progressive altitudinal vision loss OD.   -- Ophthalmology seen as an outpatient, sent to ER for further imaging and testing. -- Normal ESR and CRP. No temporal tenderness or history of jaw claudication. -- MRI brain  and orbits revealed enhancement of right the optic nerve. Appearance is most consistent with retrobulbar optic neuritis. -- Also seen on MRI: Mild chronic small vessel ischemic disease for age. Multiple scattered chronic micro hemorrhages involving the right thalamus and pons, most likely related to chronic underlying hypertension.  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  Recommendations: -Check lupus panel  -Check serum ACE level -Complete day 5/5 of IV Solumedrol -She will need outpatient neurology follow-up- and also might benefit from an outpatient spinal tap if deemed appropriate by the outpatient neurologist to look for oligoclonal bands and IgG index.  She might also benefit from visual evoked potentials which might help bolster the diagnosis of demyelination if P100 is abnormal. (She is requesting follow up this week) -Outpatient ophthalmology follow-up for possible fluorescein angiogram.  She might also benefit from a OCT as an outpatient-will defer the ophthalmology and outpatient neurology. -BP management given chronic microhemorrhages on MRI brain, which appear most likely to be secondary to prior episodes of acute on chronic HTN- discussed her to log pressures ongoing as there may be some times of BP uncontrol and she may be asymptomatic.  -Her visual acuity improvement has plateaued, consistent with efficacy of the completed IV Solumedrol treatment in the setting of probable permanent/residual optic nerve damage given the 7 days between symptom onset and starting treatment. There is therefore no indication for starting an alternate medication such as IVIG at this time.  -She should be seen in the ED immediately if symptoms return or worsen.  -If the patient continues to desire to remain an inpatient, would obtain an Ophthalmology consult for performance of a follow up dilated retinal exam.  -Neurology will sign off. Please call if there are additional questions.    LOS: 4 days   _0  signed: Dr. Kerney Elbe 01/27/2018  11:03 AM

## 2018-01-29 ENCOUNTER — Other Ambulatory Visit: Payer: Self-pay | Admitting: Internal Medicine

## 2018-01-29 ENCOUNTER — Telehealth: Payer: Self-pay

## 2018-01-29 ENCOUNTER — Telehealth: Payer: Self-pay | Admitting: Neurology

## 2018-01-29 ENCOUNTER — Telehealth: Payer: Self-pay | Admitting: Diagnostic Neuroimaging

## 2018-01-29 DIAGNOSIS — H469 Unspecified optic neuritis: Secondary | ICD-10-CM

## 2018-01-29 NOTE — Telephone Encounter (Signed)
Hospital TOC per Dr Alfonse Spruce, discharge 01/27/18, appt 01/31/18.

## 2018-01-29 NOTE — Telephone Encounter (Signed)
Dr. Alfonse Spruce is needing to speak with the on call provider about this pt to do a peer to peer to see if pt can be worked in sooner than March please call [phone]

## 2018-01-29 NOTE — Consult Note (Signed)
[facility] Primary Care Navigator  01/29/2018  Michelle Hurley 10-30-1950 [id number]   Attempt to seepatient at the bedside to identify possible discharge needs butshe wasalreadydischargedhome(senior apartment at [facility]) per Inpatient CM note.  Per MD note, patient presented with sudden onset of visual loss in the right eye, seen by ophthalmologist on a emergent basis and was diagnosed with optic neuritis, seen in consultation by neurology and was given a 5-day course of high-dose parenteral steroids. (steroid-induced hyperglycemia)  Patient has discharge instruction to follow-up withprimary care provider and neurology as arranged by Dr. Emilee Hero.  Primary care provider's office is listed as providing transition of care (TOC) follow-up.  Providers from primary care physician's office were following patient during this admission.   For additional questions please contact:  Karin Golden A. Ravin Bendall, BSN, RN-BC [facility] PRIMARY CARE Navigator Cell: [phone]

## 2018-01-29 NOTE — Telephone Encounter (Signed)
I received call from her PCP/Hospitalist Dr. Gregor Hams  Please call patient for an appt with me ASAP for optic neuritis, was treated with steroid for 5 days already  [phone]  Double book tomorrow Jan 22 or add to 12 at 12:00 slot

## 2018-01-29 NOTE — Telephone Encounter (Signed)
Per Lovey Newcomer, ok to add patient to Dr. Gladstone Lighter schedule on 01/30/2018 at 11am (arrival time 10:30am).

## 2018-01-30 ENCOUNTER — Ambulatory Visit (INDEPENDENT_AMBULATORY_CARE_PROVIDER_SITE_OTHER): Payer: Medicare HMO | Admitting: Diagnostic Neuroimaging

## 2018-01-30 ENCOUNTER — Encounter: Payer: Self-pay | Admitting: Diagnostic Neuroimaging

## 2018-01-30 DIAGNOSIS — H469 Unspecified optic neuritis: Secondary | ICD-10-CM | POA: Diagnosis not present

## 2018-01-30 DIAGNOSIS — Z1389 Encounter for screening for other disorder: Secondary | ICD-10-CM | POA: Diagnosis not present

## 2018-01-30 DIAGNOSIS — E782 Mixed hyperlipidemia: Secondary | ICD-10-CM | POA: Diagnosis not present

## 2018-01-30 NOTE — Progress Notes (Signed)
[facility]  PATIENT: Michelle Hurley DOB: 13-Feb-1950  REFERRING CLINICIAN: ER  HISTORY FROM: patient and chart REASON FOR VISIT: new consult    HISTORICAL  CHIEF COMPLAINT:  Chief Complaint  Patient presents with  . New Patient (Initial Visit)    Dr. Nathanial Rancher  . optic neuritis    Rm 7, Dorian Pod sister, [place] [facility]. Finished steroids in hospital.  Had MRI.  Seen Dr. Katy Fitch.    HISTORY OF PRESENT ILLNESS:   68 year old female with hypertension, here for evaluation of right retrobulbar, idiopathic optic neuritis.  01/22/2018 patient presented to the hospital after seeing her ophthalmologist for new onset right eye vision loss.  She reported an altitudinal superior visual field defect with some pain.  No problems in the left eye.  Upon admission patient had MRI of the brain and orbits which showed enhancing right optic nerve lesion.  Mild chronic small vessel ischemic disease was also noted as well as scattered chronic cerebral microhemorrhages.  Patient was diagnosed with idiopathic optic neuritis.  She was treated with IV Solu-Medrol.  She had some side effects of hallucinations and irritability.  She completed her course and was discharged home.  Since that time symptoms are continuing to improve.  No problems with arms, legs, balance, bowel or bladder dysfunction.   REVIEW OF SYSTEMS: Full 14 system review of systems performed and negative with exception of: As per HPI.  ALLERGIES: Allergies  Allergen Reactions  . Mushroom Extract Complex Anaphylaxis, Itching and Swelling    Swelling all over body   . Codeine Nausea And Vomiting  . Hydrocodone Nausea And Vomiting  . Codeine Nausea And Vomiting  . Prednisone Nausea And Vomiting    HOME MEDICATIONS: Outpatient Medications Prior to Visit  Medication Sig Dispense Refill  . acetaminophen (TYLENOL) 325 MG tablet Take 2 tablets (650 mg total) by mouth every 6 (six) hours as needed for moderate pain,  fever or headache. 100 tablet 0  . carvedilol (COREG) 25 MG tablet Take 1 tablet (25 mg total) by mouth 2 (two) times daily with a meal. 180 tablet 1  . furosemide (LASIX) 40 MG tablet Take 1 tablet (40 mg total) by mouth daily. 90 tablet 1  . loratadine (CLARITIN) 10 MG tablet Take 1 tablet (10 mg total) by mouth daily. 30 tablet 2  . lovastatin (MEVACOR) 20 MG tablet TAKE 1 TABLET AT BEDTIME (Patient taking differently: Take 20 mg by mouth at bedtime. ) 90 tablet 1  . polyethylene glycol powder (GLYCOLAX/MIRALAX) powder Take 17 g by mouth daily as needed for moderate constipation or severe constipation. 255 g 0  . spironolactone (ALDACTONE) 25 MG tablet Take 1 tablet (25 mg total) by mouth daily. 90 tablet 1  . verapamil (CALAN) 120 MG tablet TAKE 1/2 TABLET THREE TIMES DAILY (Patient taking differently: Take 60 mg by mouth 3 (three) times daily. ) 135 tablet 3  . cetirizine (ZYRTEC) 1 MG/ML syrup Take 10 mLs (10 mg total) by mouth daily. (Patient not taking: Reported on 01/23/2018) 236 mL 12  . gabapentin (NEURONTIN) 300 MG capsule Take 1 capsule (300 mg total) by mouth 2 (two) times daily. Take 1 tablet in the morning and 1 tablet at night. (Patient not taking: Reported on 01/23/2018) 60 capsule 0  . omeprazole (PRILOSEC) 20 MG capsule Take 1 capsule (20 mg total) by mouth daily for 28 days. (Patient not taking: Reported on 01/23/2018) 28 capsule 0  . clobetasol cream (TEMOVATE) 9.56 % Apply 1 application  topically 2 (two) times daily. (Patient not taking: Reported on 01/23/2018) 30 g 0  . diclofenac sodium (VOLTAREN) 1 % GEL Apply 2 g topically 4 (four) times daily. (Patient not taking: Reported on 01/23/2018) 1 Tube 2  . fluticasone (FLONASE) 50 MCG/ACT nasal spray Place 2 sprays into both nostrils daily. 1 g 0  . guaiFENesin-dextromethorphan (ROBITUSSIN DM) 100-10 MG/5ML syrup Take 5 mLs by mouth every 4 (four) hours as needed for cough. (Patient not taking: Reported on 01/23/2018) 118 mL 0  .  naproxen (NAPROSYN) 375 MG tablet Take 1 tablet (375 mg total) by mouth 2 (two) times daily with a meal. (Patient not taking: Reported on 01/23/2018) 20 tablet 0  . ondansetron (ZOFRAN) 4 MG tablet Take 1 tablet (4 mg total) by mouth every 8 (eight) hours as needed for nausea or vomiting. (Patient not taking: Reported on 01/23/2018) 20 tablet 0  . scopolamine (TRANSDERM-SCOP, 1.5 MG,) 1 MG/3DAYS Place 1 patch (1.5 mg total) onto the skin every 3 (three) days as needed. (Patient not taking: Reported on 01/23/2018) 4 patch 1  . senna (SENOKOT) 8.6 MG TABS tablet Take 2 tablets (17.2 mg total) by mouth at bedtime as needed for mild constipation. (Patient not taking: Reported on 01/23/2018) 120 each 0   No facility-administered medications prior to visit.     PAST MEDICAL HISTORY: Past Medical History:  Diagnosis Date  . Anxiety state, unspecified   . Arthritis   . H/O: hysterectomy   . Hypertension   . Morbid obesity ([place])   . Myopia   . NSVT (nonsustained ventricular tachycardia) ([place])    a. 01/2005 Echo: EF 55-65%  . Osteopenia   . Other and unspecified hyperlipidemia   . Palpitations   . Umbilical hernia   . Unspecified essential hypertension     PAST SURGICAL HISTORY: Past Surgical History:  Procedure Laterality Date  . ABDOMINAL HYSTERECTOMY  1974  . ESOPHAGOGASTRODUODENOSCOPY N/A 05/28/2014   Procedure: ESOPHAGOGASTRODUODENOSCOPY (EGD);  Surgeon: Teena Irani, MD;  Location: [facility];  Service: Endoscopy;  Laterality: N/A;  . LAPAROSCOPIC SMALL BOWEL RESECTION N/A 06/04/2014   Procedure: LAPAROSCOPIC SMALL BOWEL RESECTION;  Surgeon: Fanny Skates, MD;  Location: [facility];  Service: General;  Laterality: N/A;  . LAPAROSCOPIC TRANSABDOMINAL HERNIA N/A 05/03/2014   Procedure: LAPAROSCOPIC EXPLORATION WITH OPEN REPAIR OF INCARCERATED EPIGASTRIC HERNIA ;  Surgeon: Alphonsa Overall, MD;  Location: [facility];  Service: General;  Laterality: N/A;    FAMILY HISTORY: Family History  Problem  Relation Age of Onset  . Heart disease Mother   . Diabetes Mother   . Lung cancer Mother   . Thyroid cancer Sister   . Diabetes Sister   . Hypertension Brother   . Heart attack Brother   . Stroke Sister   . Breast cancer Neg Hx     SOCIAL HISTORY: Social History   Socioeconomic History  . Marital status: Married    Spouse name: Not on file  . Number of children: Not on file  . Years of education: Not on file  . Highest education level: Not on file  Occupational History  . Occupation: Retired  Scientific laboratory technician  . Financial resource strain: Not on file  . Food insecurity:    Worry: Not on file    Inability: Not on file  . Transportation needs:    Medical: Not on file    Non-medical: Not on file  Tobacco Use  . Smoking status: Never Smoker  . Smokeless tobacco: Never Used  Substance and Sexual Activity  . Alcohol use: No    Alcohol/week: 0.0 standard drinks  . Drug use: No  . Sexual activity: Not on file  Lifestyle  . Physical activity:    Days per week: Not on file    Minutes per session: Not on file  . Stress: Not on file  Relationships  . Social connections:    Talks on phone: Not on file    Gets together: Not on file    Attends religious service: Not on file    Active member of club or organization: Not on file    Attends meetings of clubs or organizations: Not on file    Relationship status: Not on file  . Intimate partner violence:    Fear of current or ex partner: Not on file    Emotionally abused: Not on file    Physically abused: Not on file    Forced sexual activity: Not on file  Other Topics Concern  . Not on file  Social History Narrative   ** Merged History Encounter **  Lives home alone.  Divorced x 2.  College.  Medical office assistant hx.           PHYSICAL EXAM  GENERAL EXAM/CONSTITUTIONAL: Vitals:  Vitals:   01/30/18 1054  BP: 121/77  Pulse: 78  Weight: 197 lb 6.4 oz (89.5 kg)  Height: 5\' 2"  (1.575 m)     Body mass index is  36.1 kg/m. Wt Readings from Last 3 Encounters:  01/30/18 197 lb 6.4 oz (89.5 kg)  01/27/18 200 lb 13.4 oz (91.1 kg)  01/22/18 198 lb 1.6 oz (89.9 kg)     Patient is in no distress; well developed, nourished and groomed; neck is supple  CARDIOVASCULAR:  Examination of carotid arteries is normal; no carotid bruits  Regular rate and rhythm, no murmurs  Examination of peripheral vascular system by observation and palpation is normal  EYES:  Ophthalmoscopic exam of optic discs and posterior segments is normal; no papilledema or hemorrhages  Visual Acuity Screening   Right eye Left eye Both eyes  Without correction: 0 20/30   With correction:        MUSCULOSKELETAL:  Gait, strength, tone, movements noted in Neurologic exam below  NEUROLOGIC: MENTAL STATUS:  No flowsheet data found.  awake, alert, oriented to person, place and time  recent and remote memory intact  normal attention and concentration  language fluent, comprehension intact, naming intact  fund of knowledge appropriate  CRANIAL NERVE:   2nd - no papilledema on fundoscopic exam  2nd, 3rd, 4th, 6th - pupils equal and reactive to light, visual fields full to confrontation, extraocular muscles intact, no nystagmus; DECR VISION IN RIGHT EYE --> ABLE TO COUNT FINGERS IN INFERIOR VISUAL FIELD  5th - facial sensation symmetric  7th - facial strength symmetric  8th - hearing intact  9th - palate elevates symmetrically, uvula midline  11th - shoulder shrug symmetric  12th - tongue protrusion midline  MOTOR:   normal bulk and tone, full strength in the BUE, BLE  SENSORY:   normal and symmetric to light touch, temperature, vibration  COORDINATION:   finger-nose-finger, fine finger movements normal  REFLEXES:   deep tendon reflexes present and symmetric  GAIT/STATION:   narrow based gait     DIAGNOSTIC DATA (LABS, IMAGING, TESTING) - I reviewed patient records, labs, notes, testing  and imaging myself where available.  Lab Results  Component Value Date   WBC 4.4 01/22/2018  HGB 13.4 01/22/2018   HCT 42.0 01/22/2018   MCV 85.2 01/22/2018   PLT 253 01/22/2018      Component Value Date/Time   NA 136 01/27/2018 0700   NA 141 04/06/2017 1618   K 3.3 (L) 01/27/2018 0700   CL 104 01/27/2018 0700   CO2 21 (L) 01/27/2018 0700   GLUCOSE 155 (H) 01/27/2018 0700   BUN 27 (H) 01/27/2018 0700   BUN 15 04/06/2017 1618   CREATININE 1.26 (H) 01/27/2018 0700   CREATININE 1.03 (H) 11/19/2014 1131   CALCIUM 8.1 (L) 01/27/2018 0700   PROT 7.8 11/19/2014 1131   ALBUMIN 4.4 11/19/2014 1131   AST 18 11/19/2014 1131   ALT 19 11/19/2014 1131   ALKPHOS 59 11/19/2014 1131   BILITOT 0.5 11/19/2014 1131   GFRNONAA 43 (L) 01/27/2018 0700   GFRNONAA 57 (L) 11/19/2014 1131   GFRAA 50 (L) 01/27/2018 0700   GFRAA 65 11/19/2014 1131   Lab Results  Component Value Date   CHOL 161 11/19/2014   HDL 46 11/19/2014   LDLCALC 96 11/19/2014   LDLDIRECT 182.8 04/27/2010   TRIG 93 11/19/2014   CHOLHDL 3.5 11/19/2014   Lab Results  Component Value Date   HGBA1C 6.2 (H) 01/23/2018   Lab Results  Component Value Date   QTMAUQJF35 456 01/23/2018   Lab Results  Component Value Date   TSH 0.98 04/27/2010   Lab Results  Component Value Date   ANA Negative 01/23/2018   01/23/18 HIV Screen 4th Generation wRfx Non Reactive  01/22/18 MRI brain and orbits [I reviewed images myself and agree with interpretation. -VRP]  1. Abnormal enhancement involving the right optic nerve as above, suggesting acute optic neuritis. 2. No other acute intracranial abnormality. 3. Mild chronic small vessel ischemic disease for age. 4. Multiple scattered chronic micro hemorrhages involving the right thalamus and pons, most likely related to chronic underlying hypertension.   ASSESSMENT AND PLAN  68 y.o. year old female here with idiopathic optic neuritis (right eye; retrobulbar). Doing well.    Dx:  1. Optic neuritis     PLAN:  RIGHT OPTIC NEURITIS (retrobulbar, idiopathic) - check MRI cervical / thoracic spine w/wo (rule out spinal cord lesions); then may consider LP - check vitamin D level and NMO ab  Orders Placed This Encounter  Procedures  . MR CERVICAL SPINE W WO CONTRAST  . MR THORACIC SPINE W WO CONTRAST  . VITAMIN D 25 Hydroxy (Vit-D Deficiency, Fractures)  . Neuromyelitis optica autoab, IgG   Return in about 4 months (around 05/31/2018).    Penni Bombard, MD 2/56/3893, 73:42 AM Certified in Neurology, Neurophysiology and Neuroimaging  [facility] [address] [place], [place] [address] [phone]

## 2018-01-31 ENCOUNTER — Ambulatory Visit (INDEPENDENT_AMBULATORY_CARE_PROVIDER_SITE_OTHER): Payer: Medicare HMO | Admitting: Internal Medicine

## 2018-01-31 ENCOUNTER — Other Ambulatory Visit: Payer: Self-pay

## 2018-01-31 ENCOUNTER — Encounter: Payer: Self-pay | Admitting: Internal Medicine

## 2018-01-31 VITALS — BP 126/65 | HR 73 | Temp 98.8°F | Ht 62.0 in | Wt 198.0 lb

## 2018-01-31 DIAGNOSIS — I10 Essential (primary) hypertension: Secondary | ICD-10-CM | POA: Diagnosis not present

## 2018-01-31 DIAGNOSIS — Z79899 Other long term (current) drug therapy: Secondary | ICD-10-CM | POA: Diagnosis not present

## 2018-01-31 DIAGNOSIS — H469 Unspecified optic neuritis: Secondary | ICD-10-CM

## 2018-01-31 NOTE — Patient Instructions (Addendum)
Thank you for allowing [name] to provide your care today. Today you visited [name] in clinic for recent hospital visit follow up. I am glad that your vision is getting better. As we discussed, your physical exam also shows some improvement in your vision but it may take time for full recovery.  Please follow your neurologist plan for MRI and follow up with him as planned. I have ordered blood test for you to check your blood sugar and potassium. I will call if any are abnormal.    Today we did not make any changes to your medications.  Please take them as before.  Please follow-up with ophthalmologist. And return in our clinic as needed if your symptoms get worse.  Should you have any questions or concerns please call the internal medicine clinic at [phone].   Thank you

## 2018-01-31 NOTE — Assessment & Plan Note (Signed)
Blood pressure well controlled at 126/65 today.  -Continue Coreg 5 mg twice daily, spironolactone 5 mg daily, verapamil 60 mg 3 times daily, Lasix 40 mg daily -BMP today

## 2018-01-31 NOTE — Progress Notes (Signed)
   CC: Follow up of optic neuritis  HPI:  [name] is a 68 y.o. female with past medical history listed below, came into clinic today for follow-up of recent hospitalization for optic neuritis.  Please see problem based charting for further details and assessment and plan.  Past Medical History:  Diagnosis Date  . Anxiety state, unspecified   . Arthritis   . H/O: hysterectomy   . Hypertension   . Morbid obesity ([place])   . Myopia   . NSVT (nonsustained ventricular tachycardia) ([place])    a. 01/2005 Echo: EF 55-65%  . Osteopenia   . Other and unspecified hyperlipidemia   . Palpitations   . Umbilical hernia   . Unspecified essential hypertension    Review of Systems:  Review of Systems  Eyes: Negative for pain and discharge.  Neurological: Negative for dizziness, sensory change, focal weakness and weakness.  Psychiatric/Behavioral: Nervous/anxious: .PHYsexam.      Physical Exam:  Vitals:   01/31/18 0921  BP: 126/65  Pulse: 73  Temp: 98.8 F (37.1 C)  TempSrc: Oral  SpO2: 100%  Weight: 198 lb (89.8 kg)  Height: 5\' 2"  (1.575 m)  Constitutional:  Patient is not in acute distress Eye exam: Right eye: Visual field deficit in about one third upper field. No redness, discharge. Pupils with normal size and reactive to lights. Left eye exam is normal  Cardiovascular: RRR, no murmur, mild S1-S2 Lung exam: CTA bilaterally, breath sounds are normal, no wheeze, no rale Abdomen: Is soft and nontender Extremities: Pulses are normal, no lower extremity edema Neurologic exam: Patient is alert and oriented x3 No sensory deficit No weaknesses Psychiatric: Nl mood and affect  Assessment & Plan:   See Encounters Tab for problem based charting.  Patient discussed with Dr. Verdell Carmine, MD

## 2018-01-31 NOTE — Assessment & Plan Note (Signed)
Patient admitted in hospital at 01/22/2018 for right side optic neuritis. Treated with IV steroid for 5 days. (No PO steroid after discharge) Discharged 4 days ago (19/01/2018)and is here today for hospital follow-up.  She mentions she feels better, her vision improved but is still feels there is a film over upper part of right eye visual field.  She is also a little drowsy since she got IV steroids. She denies any eye pain. denies any numbness, tingling or weakness. She saw neurologist yesterday, plan to do MR cervical spine and thoracic and follow-up in 3 to 4 months.  No plan for LP at this time.  Also recommended to see ophthalmologist.  Exam: Right eye: Visual field deficit in about one third of upper field. No conjunctiva redness, or discharge. Pupils with normal size and reactive to lights. Left eye exam is normal.  Neurologic exam is otherwise unremarkable.  Assessment and plan: Right side optic neuritis Her (supperior) visual field deficit of right eye is improving but not fully recovered yet.  No PO steroid after hospital discharge, will observe improvement.  Plan to do MRI per neurologist recommendation and will f/u with ophthalmology.  She had hyperglycemia due to IV steroid as well as some hypokalemia while in hospital that improved. I will repeat BMP today.  -BMP -Follow neurology recommendation for MRI -Follow-up with ophthalmologist -Return in clinic anytime if any worsening of symptoms happen

## 2018-02-01 ENCOUNTER — Encounter: Payer: Self-pay | Admitting: Diagnostic Neuroimaging

## 2018-02-01 LAB — BMP8+ANION GAP
Anion Gap: 18 mmol/L (ref 10.0–18.0)
BUN/Creatinine Ratio: 17 (ref 12–28)
BUN: 19 mg/dL (ref 8–27)
CO2: 20 mmol/L (ref 20–29)
CREATININE: 1.11 mg/dL — AB (ref 0.57–1.00)
Calcium: 8.3 mg/dL — ABNORMAL LOW (ref 8.7–10.3)
Chloride: 97 mmol/L (ref 96–106)
GFR calc Af Amer: 59 mL/min/{1.73_m2} — ABNORMAL LOW (ref >59)
GFR, EST NON AFRICAN AMERICAN: 51 mL/min/{1.73_m2} — AB (ref >59)
Glucose: 135 mg/dL — ABNORMAL HIGH (ref 65–99)
Potassium: 3.2 mmol/L — ABNORMAL LOW (ref 3.5–5.2)
Sodium: 135 mmol/L (ref 134–144)

## 2018-02-01 NOTE — Progress Notes (Signed)
Internal Medicine Clinic Attending  Case discussed with Dr. Masoudi  at the time of the visit.  We reviewed the resident's history and exam and pertinent patient test results.  I agree with the assessment, diagnosis, and plan of care documented in the resident's note.  

## 2018-02-02 ENCOUNTER — Other Ambulatory Visit: Payer: Self-pay | Admitting: Internal Medicine

## 2018-02-02 DIAGNOSIS — I10 Essential (primary) hypertension: Secondary | ICD-10-CM

## 2018-02-02 MED ORDER — POTASSIUM CHLORIDE ER 10 MEQ PO TBCR: 20.0000 meq | EXTENDED_RELEASE_TABLET | Freq: Every day | ORAL | 0 refills | Status: DC

## 2018-02-03 ENCOUNTER — Other Ambulatory Visit: Payer: Self-pay | Admitting: Internal Medicine

## 2018-02-03 ENCOUNTER — Telehealth: Payer: Self-pay | Admitting: Internal Medicine

## 2018-02-03 DIAGNOSIS — I10 Essential (primary) hypertension: Secondary | ICD-10-CM

## 2018-02-03 NOTE — Telephone Encounter (Signed)
Called patient and talked to her, notified her about K:3.2 and sent prescription for K-dur to her pharmacy. Recommended to come back in 10-12 days to rechecking K.  She verbalized understanding and agrees with plan. She also mentions that she feels much better comparing to to last visit.

## 2018-02-04 ENCOUNTER — Telehealth: Payer: Self-pay | Admitting: Diagnostic Neuroimaging

## 2018-02-04 NOTE — Telephone Encounter (Signed)
Michelle Hurley: [phone] (exp. 02/04/18 to 03/06/18)/medicaid order sent to [facility]. They will reach out tot he pt to schedule.

## 2018-02-05 LAB — NEUROMYELITIS OPTICA AUTOAB, IGG: NMO IgG Autoantibodies: 260.6 U/mL — ABNORMAL HIGH (ref 0.0–3.0)

## 2018-02-05 LAB — VITAMIN D 25 HYDROXY (VIT D DEFICIENCY, FRACTURES): Vit D, 25-Hydroxy: 4.6 ng/mL — ABNORMAL LOW (ref 30.0–100.0)

## 2018-02-07 ENCOUNTER — Telehealth: Payer: Self-pay

## 2018-02-07 ENCOUNTER — Other Ambulatory Visit: Payer: Self-pay | Admitting: Internal Medicine

## 2018-02-07 ENCOUNTER — Telehealth: Payer: Self-pay | Admitting: *Deleted

## 2018-02-07 DIAGNOSIS — E559 Vitamin D deficiency, unspecified: Secondary | ICD-10-CM

## 2018-02-07 MED ORDER — VITAMIN D3 1.25 MG (50000 UT) PO CAPS: 50000.0000 [IU] | ORAL_CAPSULE | ORAL | 0 refills | Status: DC

## 2018-02-07 NOTE — Telephone Encounter (Signed)
Dr Myrtie Hawk, please call this pt as soon as possible, with her vit D value being as it is I am sure she has questions that I may not be able to answer

## 2018-02-07 NOTE — Telephone Encounter (Signed)
Requesting to speak with a nurse about Vitamin D. Please call pt back.

## 2018-02-07 NOTE — Telephone Encounter (Signed)
-----   Message from Penni Bombard, MD sent at 02/05/2018  3:47 PM EST ----- NMO ab is positive. Follow up MRI scans. This is consistent with neuromyelitis optica (devic's disease). Will likely proceed with treatment (rituxan). -VRP

## 2018-02-07 NOTE — Telephone Encounter (Signed)
Spoke to pt and relayed that her lab results showed NMO antibody was positive.  Proceed with MRI's, which are scheduled 02-16-2018. This positive test, consistent with NMO (devics disease).  Will likely to proceed with rituxin treatment.  Also relayed that her Vit D level low.  Will forward to pcp.  She verbalized understanding.  Will try to f/u with her pcp about vit D level.  Forwarded result note to pcp, Dr. Eileen Stanford.

## 2018-02-08 NOTE — Telephone Encounter (Signed)
Pt seen by [facility] on 01/31/18 for HFU.Despina Hidden Cassady1/31/202010:58 AM

## 2018-02-12 ENCOUNTER — Encounter: Payer: Self-pay | Admitting: Internal Medicine

## 2018-02-12 ENCOUNTER — Other Ambulatory Visit: Payer: Self-pay

## 2018-02-12 ENCOUNTER — Telehealth: Payer: Self-pay | Admitting: *Deleted

## 2018-02-12 ENCOUNTER — Ambulatory Visit (INDEPENDENT_AMBULATORY_CARE_PROVIDER_SITE_OTHER): Payer: Medicare HMO | Admitting: Internal Medicine

## 2018-02-12 VITALS — BP 127/75 | HR 68 | Temp 98.1°F | Ht 62.0 in | Wt 194.4 lb

## 2018-02-12 DIAGNOSIS — B3731 Acute candidiasis of vulva and vagina: Secondary | ICD-10-CM

## 2018-02-12 DIAGNOSIS — Z79899 Other long term (current) drug therapy: Secondary | ICD-10-CM

## 2018-02-12 DIAGNOSIS — E876 Hypokalemia: Secondary | ICD-10-CM | POA: Diagnosis not present

## 2018-02-12 DIAGNOSIS — H469 Unspecified optic neuritis: Secondary | ICD-10-CM

## 2018-02-12 DIAGNOSIS — E559 Vitamin D deficiency, unspecified: Secondary | ICD-10-CM

## 2018-02-12 DIAGNOSIS — B373 Candidiasis of vulva and vagina: Secondary | ICD-10-CM

## 2018-02-12 HISTORY — DX: Hypokalemia: E87.6

## 2018-02-12 HISTORY — DX: Acute candidiasis of vulva and vagina: B37.31

## 2018-02-12 HISTORY — DX: Vitamin D deficiency, unspecified: E55.9

## 2018-02-12 HISTORY — DX: Candidiasis of vulva and vagina: B37.3

## 2018-02-12 MED ORDER — FLUCONAZOLE 150 MG PO TABS: ORAL_TABLET | ORAL | 0 refills | Status: DC

## 2018-02-12 NOTE — Telephone Encounter (Signed)
I spoke to pt and relayed that Dr. Leta Baptist waiting on MRI (to be done 02-16-18) then reviewed then recommendations will be made.  She verbalized understanding.

## 2018-02-12 NOTE — Progress Notes (Signed)
   CC: Follow-up of hypokalemia  HPI:  [name] is a 68 y.o. female with hypertension and history of optic neuritis who presents for follow-up of hypokalemia.  Hypokalemia Most likely due to chronic Lasix use.  Unclear why she is taking this medication as she does not have a history of congestive heart failure.  She was unaware that she needed to take potassium supplementation.  Optic Neuritis She was recently admitted to the hospital for optic neuritis.  She is being followed by neurologist.  She is NMO antibody positive consistent with neuromyelitis optica spectrum disorders.  MRI cervical/thoracic spine scheduled for 02/16/2018.  She states that her vision is unchanged and she continues to have graying of central part of her right eye vision.  Vitamin D Deficiency Prescribed Vit D 50,000 units once weekly x 6 weeks.  She has been taking this medication without issue.  Vaginal Candidiasis Several day history of irritation to the vaginal wall.  She states that this is very similar to when she had a yeast infection in the past.  She denies any vaginal discharge.  She has been taking over-the-counter antifungal topical medication which has helped with her symptoms.  She denies recent sexual conduct.   Past Medical History:  Diagnosis Date  . Anxiety state, unspecified   . Arthritis   . H/O: hysterectomy   . Hypertension   . Morbid obesity ([place])   . Myopia   . NSVT (nonsustained ventricular tachycardia) ([place])    a. 01/2005 Echo: EF 55-65%  . Osteopenia   . Other and unspecified hyperlipidemia   . Palpitations   . Umbilical hernia   . Unspecified essential hypertension    Review of Systems:   Review of Systems  Constitutional: Negative for chills and fever.  Eyes: Positive for blurred vision.  Respiratory: Negative for cough and shortness of breath.   Cardiovascular: Negative for chest pain and palpitations.  Gastrointestinal: Negative for abdominal pain, diarrhea,  nausea and vomiting.  Genitourinary: Negative for dysuria and hematuria.  Neurological: Negative for dizziness and headaches.    Physical Exam:  Vitals:   02/12/18 0851  BP: 127/75  Pulse: 68  Temp: 98.1 F (36.7 C)  TempSrc: Oral  SpO2: 99%  Weight: 194 lb 6.4 oz (88.2 kg)  Height: 5\' 2"  (1.575 m)   Physical Exam Vitals signs and nursing note reviewed.  Constitutional:      Appearance: Normal appearance.  HENT:     Head: Normocephalic and atraumatic.  Cardiovascular:     Rate and Rhythm: Normal rate and regular rhythm.     Pulses: Normal pulses.  Pulmonary:     Effort: Pulmonary effort is normal. No respiratory distress.     Breath sounds: Normal breath sounds.  Neurological:     Mental Status: She is alert.     Comments: OU: Normal visual field in all 4 quadrants. EOMI.   Psychiatric:        Mood and Affect: Mood normal.        Behavior: Behavior normal.     Assessment & Plan:   See Encounters Tab for problem based charting.  Patient discussed with Dr. Eppie Gibson

## 2018-02-12 NOTE — Progress Notes (Signed)
Case discussed with Dr. Prince at the time of the visit. We reviewed the resident's history and exam and pertinent patient test results. I agree with the assessment, diagnosis, and plan of care documented in the resident's note. 

## 2018-02-12 NOTE — Telephone Encounter (Signed)
Pt called back stating that she was informed that due to these lab results that were explained to her she was supposed to have medication administered and she is want to know when will this be happening. Please advise.

## 2018-02-12 NOTE — Assessment & Plan Note (Signed)
Assessment: Most likely due to chronic Lasix use but I am unclear why she is taking this medication as she does not have a history of congestive heart failure. A prescription of K-Dur was sent to pharmacy several days ago but has not been picked up.    Plan: 1.  Follow-up with current PCP to determine need for continued Lasix use. 2.  I advised her to pick up the K-Dur prescription and start taking as soon as possible. 3.  Follow-up in approximately 3 to 4 weeks for repeat BMP.

## 2018-02-12 NOTE — Assessment & Plan Note (Signed)
Assessment: Recently prescribed vitamin D 50,000 units once weekly for 6 weeks.  She is compliant with his medication has had any issues.  Plan: 1.  Continue current regiment.

## 2018-02-12 NOTE — Assessment & Plan Note (Signed)
Assessment: Michelle Hurley has symptoms consistent with vaginitis and believes that this is similar to the yeast infections that she has had in the past.  Given her recent steroid use I do believe that it is reasonable to empirically treat her for vaginal candidiasis.  Plan: 1.  Fluconazole 150 mg once, then another fluconazole and a 50 mg dose in 72 hours.

## 2018-02-12 NOTE — Telephone Encounter (Signed)
Pt called to confirm meds sent to pharm, called pharm and they confirmed that pt has all her meds, called her to desk as they spoke to triage and informed her of when they were picked up

## 2018-02-12 NOTE — Assessment & Plan Note (Signed)
Assessment: Michelle Hurley had many questions about her recent lab draws including a positive NMO antibody test.  I explained to her that this is consistent with neuromyelitis optica.  Per chart review neurology may wish to start an immunosuppressant.  I advised her to follow-up with her neurologist as soon as possible to go over the results and to begin therapy if appropriate.  Plan: 1.  Continue to monitor for vision changes

## 2018-02-16 ENCOUNTER — Ambulatory Visit
Admission: RE | Admit: 2018-02-16 | Discharge: 2018-02-16 | Disposition: A | Discharge status: Home / Self Care | Payer: Medicare HMO | Source: Ambulatory Visit | Attending: Diagnostic Neuroimaging | Admitting: Diagnostic Neuroimaging

## 2018-02-16 DIAGNOSIS — H469 Unspecified optic neuritis: Secondary | ICD-10-CM | POA: Diagnosis not present

## 2018-02-16 MED ORDER — GADOBENATE DIMEGLUMINE 529 MG/ML IV SOLN
18.0000 mL | Freq: Once | INTRAVENOUS | Status: AC | PRN
  Administered 2018-02-16: 18 mL via INTRAVENOUS

## 2018-02-20 ENCOUNTER — Telehealth: Payer: Self-pay

## 2018-02-20 NOTE — Telephone Encounter (Signed)
Requesting MRI results. Please call back.

## 2018-02-21 NOTE — Telephone Encounter (Signed)
Called pt back, she states she is being proactive but also she has a a cold for 2 days and was told by dr Alfonse Spruce whom she has high regard for that she should not have a cold without being seen after 2 days. She states she was told by the neuro doctor that there was something very serious going on but cannot get in touch with him now. She wants to see dr Alfonse Spruce and get her input

## 2018-02-21 NOTE — Telephone Encounter (Signed)
Please notify patient to call her neurologist for MRI results. They ordered the tests and would be better able to interpret MRI findings and their clinical significance. Thank you.

## 2018-02-22 ENCOUNTER — Ambulatory Visit (INDEPENDENT_AMBULATORY_CARE_PROVIDER_SITE_OTHER): Payer: Medicare HMO | Admitting: Internal Medicine

## 2018-02-22 ENCOUNTER — Encounter: Payer: Self-pay | Admitting: Internal Medicine

## 2018-02-22 ENCOUNTER — Other Ambulatory Visit: Payer: Self-pay

## 2018-02-22 ENCOUNTER — Telehealth: Payer: Self-pay | Admitting: *Deleted

## 2018-02-22 ENCOUNTER — Encounter: Payer: Self-pay | Admitting: *Deleted

## 2018-02-22 ENCOUNTER — Telehealth: Payer: Self-pay

## 2018-02-22 VITALS — BP 122/64 | HR 65 | Temp 97.7°F | Ht 62.0 in | Wt 194.8 lb

## 2018-02-22 DIAGNOSIS — H469 Unspecified optic neuritis: Secondary | ICD-10-CM

## 2018-02-22 DIAGNOSIS — J069 Acute upper respiratory infection, unspecified: Secondary | ICD-10-CM | POA: Diagnosis not present

## 2018-02-22 DIAGNOSIS — G360 Neuromyelitis optica [Devic]: Secondary | ICD-10-CM

## 2018-02-22 MED ORDER — BENZONATATE 100 MG PO CAPS: 100.0000 mg | ORAL_CAPSULE | Freq: Four times a day (QID) | ORAL | 0 refills | Status: DC | PRN

## 2018-02-22 NOTE — Telephone Encounter (Signed)
Received call from [place], [place] with Internal Med at [facility]. She stated the patient was seen today and expressed concern over not getting MRI results and a medication she understood she was to begin. Erline Levine asked that the patient be called. I advised will discuss with Dr Leta Baptist and call her. Stacey verbalized appreciation.

## 2018-02-22 NOTE — Telephone Encounter (Signed)
Pt seen in [facility] today by Dr. Alfonse Spruce.  Pt verbalized she was very worried because she was told per neurology she needed to start immunotherapy infusion and she has not heard anything recently about start dates.  Pt expresses concern about vision loss due to neuromyelitis optica.  Pt also states she has not heard results from MRI ordered from [place].  This RN reached out to [place] on pt behalf per Dr. Jerlyn Ly request.  This RN spoke with Tyler Aas, RN at [facility] and informed Ailey RN that pt was in our clinic today and expressed concern r/t vision loss, immunotherapy, and not knowing MRI results.  This RN asked Tyler Aas to reach out to patient to provide education/information.  Tyler Aas states she will call patient, as well as, speak to Dr. Leta Baptist regarding any recommendation. Will forward to Dr. Alfonse Spruce and PCP. SChaplin, RN,BSN

## 2018-02-22 NOTE — Telephone Encounter (Signed)
See other phone note from today. Spoke with patient and informed her Dr Leta Baptist stated her MRI cervical spine shows a small spot on spinal cord, but not clear if it's related to NMO disease or not. Her MRI thoracic spine shows  unremarkable thoracic spine results. Dr Leta Baptist will proceed with rituxan based on all her testing and exam, sympotms. The patient stated she will do whatever he recommends for her. I advised the orders will be written and given to infusion RN to get insurance authorization. I advised that can take 2-3 weeks, then she'll get a call to schedule infusion. She verbalzied understanding, appreciation and stated she was relived to hear this.

## 2018-02-22 NOTE — Telephone Encounter (Signed)
Baseline screening labs ordered. Once labs are returned and ok, then proceed with rituxan protocol. -VRP

## 2018-02-22 NOTE — Addendum Note (Signed)
Addended by: Andrey Spearman R on: 02/22/2018 01:28 PM   Modules accepted: Orders

## 2018-02-22 NOTE — Assessment & Plan Note (Signed)
Assessment/plan: Her three day history of nonproductive cough and congestion which is now improving in the setting of normal physical exam is most consistent with a viral upper respiratory infection.  We will plan on continuing symptomatic management.  I have sent in a prescription for Tessalon Perles as needed for the cough and have recommended continuing over-the-counter decongestants.

## 2018-02-22 NOTE — Progress Notes (Signed)
Internal Medicine Clinic Attending  Case discussed with Dr. Prince at the time of the visit.  We reviewed the resident's history and exam and pertinent patient test results.  I agree with the assessment, diagnosis, and plan of care documented in the resident's note.   

## 2018-02-22 NOTE — Progress Notes (Signed)
   CC: Cough and Congestion  HPI:  [name] is a 68 y.o. female with history of optic neuritis and hypertension who presents with cough and congestion.   Cough and congestion: She presents with a 3-day history of nonproductive cough and congestion.  She reports that her symptoms have greatly improved since onset.  She is now only having minimal cough and congestion.  She denies fevers, shortness of breath, allergies, sick contacts  Optic Neuritis: She states that her symptoms continue to improve and she only has a minimal amount of graying of her superior visual field of the right eye.  She recently had an MRI cervical/thoracic spine which showed degenerative changes with causing mild spinal stenosis and compressive myelopathy.  They noted that demyelination cannot be ruled out.  She has not having any symptoms today including weakness, numbness, or pain.  She has not heard from her neurologist since prior to the MRI and is inquiring about her results today.   Past Medical History:  Diagnosis Date  . Anxiety state, unspecified   . Arthritis   . H/O: hysterectomy   . Hypertension   . Morbid obesity ([place])   . Myopia   . NSVT (nonsustained ventricular tachycardia) ([place])    a. 01/2005 Echo: EF 55-65%  . Osteopenia   . Other and unspecified hyperlipidemia   . Palpitations   . Umbilical hernia   . Unspecified essential hypertension    Review of Systems:   Review of Systems  Constitutional: Negative for chills and fever.  HENT: Positive for congestion. Negative for sinus pain and sore throat.   Eyes:       Minimal graying of the superior field of right eye.  Respiratory: Positive for cough. Negative for sputum production and shortness of breath.   Gastrointestinal: Negative for abdominal pain, diarrhea, nausea and vomiting.  Genitourinary: Negative for dysuria, frequency, hematuria and urgency.  All other systems reviewed and are negative.   Physical Exam:  Vitals:   02/22/18 1020  BP: 122/64  Pulse: 65  Temp: 97.7 F (36.5 C)  TempSrc: Oral  SpO2: 99%  Weight: 194 lb 12.8 oz (88.4 kg)  Height: 5\' 2"  (1.575 m)   Physical Exam Vitals signs reviewed.  Constitutional:      Appearance: Normal appearance.  HENT:     Head: Normocephalic and atraumatic.     Nose: No congestion or rhinorrhea.     Mouth/Throat:     Mouth: Mucous membranes are moist.     Pharynx: Oropharynx is clear. No oropharyngeal exudate or posterior oropharyngeal erythema.  Eyes:     Extraocular Movements: Extraocular movements intact.     Comments: Normal VFs in both eyes  Cardiovascular:     Rate and Rhythm: Normal rate and regular rhythm.  Pulmonary:     Effort: Pulmonary effort is normal. No respiratory distress.     Breath sounds: Normal breath sounds.  Neurological:     Mental Status: She is alert and oriented to person, place, and time. Mental status is at baseline.  Psychiatric:        Mood and Affect: Mood normal.        Behavior: Behavior normal.     Assessment & Plan:   See Encounters Tab for problem based charting.  Patient discussed with Dr. Angelia Mould

## 2018-02-22 NOTE — Assessment & Plan Note (Signed)
Assessment/plan: Her vision continues to improve and she has only minimal visual field loss in the right eye.  Recent MRI cervical/thoracic spine shows degenerative changes but does note that demyelination cannot be ruled out.  It is unclear how these results will change her management moving forward.  We will reach out to her neurologist so that they can call the patient and explain any clinical significance associated with these results as well as future plans for treatment.

## 2018-02-24 NOTE — Telephone Encounter (Signed)
Thanks Stacee! 

## 2018-02-25 NOTE — Telephone Encounter (Signed)
Spoke to pt and relayed that need to have some additional baseline lab work done prior to rituxin infusion.  (this is pending approval from insurance with intrafusion-she had not been contacted yet).  I reiterated that lab work requested is for baseline (prior to having rituxin).  She will come in Thursday for this.  I gave her Lab hours.  8-12 then 1300-1600 M-Th, then 8-11 on Friday.   She verbalized understanding.

## 2018-02-27 ENCOUNTER — Other Ambulatory Visit (INDEPENDENT_AMBULATORY_CARE_PROVIDER_SITE_OTHER): Payer: Self-pay

## 2018-02-27 DIAGNOSIS — G360 Neuromyelitis optica [Devic]: Secondary | ICD-10-CM | POA: Diagnosis not present

## 2018-02-27 DIAGNOSIS — Z0289 Encounter for other administrative examinations: Secondary | ICD-10-CM

## 2018-02-27 NOTE — Telephone Encounter (Signed)
Pt is wanting Rn to call her daughter Jiles Prows /[facility] [phone] and discuss with her what is going to take place. Pt said she is unable to relay the information, she does not understand it all. Pt said her daughter is flying in tomorrow evening and will be staying with her thru Monday. So please call on Friday or Monday. Thank you

## 2018-02-28 NOTE — Telephone Encounter (Signed)
Patient had pre-rituxin labs drawn 02/27/18. Some results are pending.

## 2018-03-02 LAB — IMMUNOGLOBULINS A/E/G/M, SERUM
IgA/Immunoglobulin A, Serum: 250 mg/dL (ref 87–352)
IgE (Immunoglobulin E), Serum: 34 IU/mL (ref 6–495)
IgG (Immunoglobin G), Serum: 1194 mg/dL (ref 700–1600)
IgM (Immunoglobulin M), Srm: 83 mg/dL (ref 26–217)

## 2018-03-02 LAB — COMPREHENSIVE METABOLIC PANEL
ALT: 18 IU/L (ref 0–32)
AST: 15 IU/L (ref 0–40)
Albumin/Globulin Ratio: 1.4 (ref 1.2–2.2)
Albumin: 4 g/dL (ref 3.8–4.8)
Alkaline Phosphatase: 69 IU/L (ref 39–117)
BILIRUBIN TOTAL: 0.3 mg/dL (ref 0.0–1.2)
BUN/Creatinine Ratio: 13 (ref 12–28)
BUN: 16 mg/dL (ref 8–27)
CHLORIDE: 103 mmol/L (ref 96–106)
CO2: 21 mmol/L (ref 20–29)
Calcium: 9.5 mg/dL (ref 8.7–10.3)
Creatinine, Ser: 1.23 mg/dL — ABNORMAL HIGH (ref 0.57–1.00)
GFR calc Af Amer: 52 mL/min/{1.73_m2} — ABNORMAL LOW (ref >59)
GFR calc non Af Amer: 45 mL/min/{1.73_m2} — ABNORMAL LOW (ref >59)
Globulin, Total: 2.9 g/dL (ref 1.5–4.5)
Glucose: 101 mg/dL — ABNORMAL HIGH (ref 65–99)
Potassium: 4.1 mmol/L (ref 3.5–5.2)
Sodium: 141 mmol/L (ref 134–144)
Total Protein: 6.9 g/dL (ref 6.0–8.5)

## 2018-03-02 LAB — HEPATITIS B SURFACE ANTIGEN: Hepatitis B Surface Ag: NEGATIVE

## 2018-03-02 LAB — HEPATITIS B SURFACE ANTIBODY,QUALITATIVE: Hep B Surface Ab, Qual: REACTIVE

## 2018-03-02 LAB — HEPATITIS B CORE ANTIBODY, TOTAL: Hep B Core Total Ab: POSITIVE — AB

## 2018-03-02 LAB — CBC WITH DIFFERENTIAL/PLATELET
BASOS ABS: 0 10*3/uL (ref 0.0–0.2)
Basos: 0 %
EOS (ABSOLUTE): 0 10*3/uL (ref 0.0–0.4)
Eos: 0 %
Hematocrit: 38.1 % (ref 34.0–46.6)
Hemoglobin: 12.9 g/dL (ref 11.1–15.9)
Immature Grans (Abs): 0 10*3/uL (ref 0.0–0.1)
Immature Granulocytes: 1 %
Lymphocytes Absolute: 2 10*3/uL (ref 0.7–3.1)
Lymphs: 36 %
MCH: 27.4 pg (ref 26.6–33.0)
MCHC: 33.9 g/dL (ref 31.5–35.7)
MCV: 81 fL (ref 79–97)
Monocytes Absolute: 0.5 10*3/uL (ref 0.1–0.9)
Monocytes: 10 %
Neutrophils Absolute: 2.9 10*3/uL (ref 1.4–7.0)
Neutrophils: 53 %
PLATELETS: 272 10*3/uL (ref 150–450)
RBC: 4.7 x10E6/uL (ref 3.77–5.28)
RDW: 13.6 % (ref 11.7–15.4)
WBC: 5.5 10*3/uL (ref 3.4–10.8)

## 2018-03-02 LAB — HEPATITIS C ANTIBODY: Hep C Virus Ab: <0.1 s/co ratio (ref 0.0–0.9)

## 2018-03-04 ENCOUNTER — Telehealth: Payer: Self-pay | Admitting: Diagnostic Neuroimaging

## 2018-03-04 NOTE — Telephone Encounter (Signed)
Patient would like a call back from the nurse regarding recent visit and MRI to talk with her daughter. They are in the lobby and can wait a few minutes Best call back [phone]

## 2018-03-04 NOTE — Telephone Encounter (Signed)
Called daughter, informed her that the kidney function is stable. Advised the  Hepatitis B panel suggests immunity with a prior infection. Dr Leta Baptist will check a lab- HBV-DNA to be certain the Hep Bis no longer active. If negative, then he will proceed with rituxan prior authorization. If it is positive, she will be refered to GI for treatment. I gave her lab hours.  Daughter verbalized understanding, appreciation of call; she will let patient know and have her come this week for lab.

## 2018-03-04 NOTE — Telephone Encounter (Signed)
See note dated 03/04/18.

## 2018-03-04 NOTE — Progress Notes (Signed)
Will check addl testing. _VRP

## 2018-03-04 NOTE — Telephone Encounter (Signed)
Patient and daughter , Michelle Hurley on [place] came into office today with multiple questions. The [place]. I answered all questions to their satisfaction. They are waiting on her lab results and whether she will go forward to receive Rituxan. I advised I';ll discuss with Dr Leta Baptist and call back this afternoon. Patient and daughter verbalized understanding, appreciation.

## 2018-03-05 ENCOUNTER — Other Ambulatory Visit (INDEPENDENT_AMBULATORY_CARE_PROVIDER_SITE_OTHER): Payer: Self-pay

## 2018-03-05 DIAGNOSIS — G360 Neuromyelitis optica [Devic]: Secondary | ICD-10-CM | POA: Diagnosis not present

## 2018-03-05 DIAGNOSIS — Z0289 Encounter for other administrative examinations: Secondary | ICD-10-CM

## 2018-03-07 LAB — HEPATITIS B DNA, ULTRAQUANTITATIVE, PCR: HBV DNA SERPL PCR-ACNC: NOT DETECTED IU/mL

## 2018-03-11 NOTE — Telephone Encounter (Addendum)
LVM requesting patient call back for lab results.  Per Dr Leta Baptist, patient may proceed with Rituxan. Rituxan orders, office note, lab results, MRI results, insurance information given to [place], infusion room.  Received call back from patient, and I informed her the HBV DNA was not detected.  Advised her Dr Leta Baptist stated it is okay to proceed with rituxan. I advised her the orders and other documentation has been given to infusion RN. I advised insurance authorization may take 2-3 weeks, and she will get a call from infusion RN to update her, schedule infusions.  She verbalized understanding, appreciation of call.

## 2018-03-28 DIAGNOSIS — R338 Other retention of urine: Secondary | ICD-10-CM | POA: Diagnosis not present

## 2018-03-28 DIAGNOSIS — R109 Unspecified abdominal pain: Secondary | ICD-10-CM | POA: Diagnosis not present

## 2018-03-28 DIAGNOSIS — N400 Enlarged prostate without lower urinary tract symptoms: Secondary | ICD-10-CM | POA: Diagnosis not present

## 2018-03-28 DIAGNOSIS — N401 Enlarged prostate with lower urinary tract symptoms: Secondary | ICD-10-CM | POA: Diagnosis not present

## 2018-03-28 DIAGNOSIS — R339 Retention of urine, unspecified: Secondary | ICD-10-CM | POA: Diagnosis not present

## 2018-03-29 ENCOUNTER — Telehealth: Payer: Self-pay | Admitting: Diagnostic Neuroimaging

## 2018-03-29 NOTE — Telephone Encounter (Signed)
pt has called for the intrafusion suite, call transferred °

## 2018-04-04 NOTE — Telephone Encounter (Signed)
Barnett Applebaum RN, infusion suite stated that patient will have to pay $3400 to [facility] for Rituxan infusion. Barnett Applebaum got patient on phone with [facility] for them to explain that to her. Per Barnett Applebaum, patient will try to get assistance for medication. Dr Leta Baptist is aware.

## 2018-04-10 ENCOUNTER — Telehealth: Payer: Self-pay | Admitting: *Deleted

## 2018-04-10 NOTE — Telephone Encounter (Signed)
[place] calling asking for diagnosis icd 10 code for pt rituxin.  Gave her G36.0 Neuromyelitis Optica (NMO).

## 2018-04-11 ENCOUNTER — Telehealth: Payer: Self-pay | Admitting: Diagnostic Neuroimaging

## 2018-04-11 NOTE — Telephone Encounter (Signed)
pt has called for the intrafusion suite, call transferred °

## 2018-04-15 ENCOUNTER — Other Ambulatory Visit: Payer: Self-pay | Admitting: Internal Medicine

## 2018-04-15 DIAGNOSIS — E7800 Pure hypercholesterolemia, unspecified: Secondary | ICD-10-CM

## 2018-04-18 ENCOUNTER — Telehealth: Payer: Self-pay | Admitting: Diagnostic Neuroimaging

## 2018-04-18 NOTE — Telephone Encounter (Signed)
pt has called for the intrafusion suite, call transferred °

## 2018-04-29 ENCOUNTER — Telehealth: Payer: Self-pay | Admitting: Diagnostic Neuroimaging

## 2018-04-29 NOTE — Telephone Encounter (Signed)
Pt has called for the infusion suite, call connected.

## 2018-05-06 NOTE — Telephone Encounter (Signed)
Received [employer] form from [place], RN/Intrafusion re: Rituxan infusions. Paper to be completed by Dr Leta Baptist, placed on his desk.

## 2018-05-14 ENCOUNTER — Telehealth: Payer: Self-pay | Admitting: *Deleted

## 2018-05-14 NOTE — Telephone Encounter (Signed)
Called patient and updated EMR. 

## 2018-05-15 ENCOUNTER — Other Ambulatory Visit: Payer: Self-pay

## 2018-05-15 ENCOUNTER — Ambulatory Visit (INDEPENDENT_AMBULATORY_CARE_PROVIDER_SITE_OTHER): Payer: Medicare HMO | Admitting: Diagnostic Neuroimaging

## 2018-05-15 DIAGNOSIS — G360 Neuromyelitis optica [Devic]: Secondary | ICD-10-CM | POA: Diagnosis not present

## 2018-05-15 NOTE — Progress Notes (Signed)
    Virtual Visit via Video Note  I connected with Michelle Hurley on 05/15/18 at 10:00 AM EDT by a video enabled telemedicine application and verified that I am speaking with the correct person using two identifiers.   I discussed the limitations of evaluation and management by telemedicine and the availability of in person appointments. The patient expressed understanding and agreed to proceed.  Patient is at their home. I am at the office.    History of Present Illness:  - overall feels well - vision is improved - no pain - waiting on insurance approval for rituxan    Observations/Objective:   VIDEO EXAM  GENERAL EXAM/CONSTITUTIONAL:  Vitals: There were no vitals filed for this visit.  There is no height or weight on file to calculate BMI. Wt Readings from Last 3 Encounters:  02/22/18 194 lb 12.8 oz (88.4 kg)  02/12/18 194 lb 6.4 oz (88.2 kg)  01/31/18 198 lb (89.8 kg)     Patient is in no distress; well developed, nourished and groomed; neck is supple   NEUROLOGIC: MENTAL STATUS:  No flowsheet data found.  awake, alert, oriented to person, place and time  recent and remote memory intact  normal attention and concentration  language fluent, comprehension intact, naming intact  fund of knowledge appropriate  CRANIAL NERVE:   2nd, 3rd, 4th, 6th - visual fields full to confrontation, extraocular muscles intact, no nystagmus  5th - facial sensation symmetric  7th - facial strength symmetric  8th - hearing intact  11th - shoulder shrug symmetric  12th - tongue protrusion midline  MOTOR:   NO TREMOR; NO DRIFT IN BUE  SENSORY:   normal and symmetric to light touch  COORDINATION:   fine finger movements normal  Labs 01/30/18 NMO IgG Autoantibodies 0.0 - 3.0 U/mL 260.6Comment: Negative: 0.0 - 3.0  Positive: >3.0   HEPATITIS LABS HepB panel suggests immunity with prior infx. HBV-DNA testing is negative, so no sign of current or latent  infx. Hep B Core Total Ab Negative Positive Abnormal     HBV DNA SERPL PCR-ACNC IU/mL HBV DNA not detected      Assessment and Plan:  Neuromyelitis optica (+ NMO ab; + optic neuritis; + spinal cord lesion) - plan for rituxan; patient does not have any current infx symptoms; although caution due to current COVID 19 emergency, patient would need to maintain vigilant social distancing and masking for next 6 months at least (could also consider soliris, but that does carry risk of meningitis which needs a pre-treatment vaccine.)   Follow Up Instructions:  - Return in about 6 months (around 11/15/2018).    I discussed the assessment and treatment plan with the patient. The patient was provided an opportunity to ask questions and all were answered. The patient agreed with the plan and demonstrated an understanding of the instructions.   The patient was advised to call back or seek an in-person evaluation if the symptoms worsen or if the condition fails to improve as anticipated.  I provided 30 minutes of non-face-to-face time during this encounter.   Penni Bombard, MD 1/0/1751, 02:58 AM Certified in Neurology, Neurophysiology and Neuroimaging  [facility] [address] [place], [place] [address] [phone]

## 2018-05-23 DIAGNOSIS — H469 Unspecified optic neuritis: Secondary | ICD-10-CM | POA: Diagnosis not present

## 2018-05-29 ENCOUNTER — Other Ambulatory Visit: Payer: Self-pay

## 2018-05-29 ENCOUNTER — Ambulatory Visit (INDEPENDENT_AMBULATORY_CARE_PROVIDER_SITE_OTHER): Payer: Medicare HMO | Admitting: Internal Medicine

## 2018-05-29 ENCOUNTER — Telehealth: Payer: Self-pay

## 2018-05-29 DIAGNOSIS — H469 Unspecified optic neuritis: Secondary | ICD-10-CM

## 2018-05-29 DIAGNOSIS — B3731 Acute candidiasis of vulva and vagina: Secondary | ICD-10-CM

## 2018-05-29 DIAGNOSIS — Z7952 Long term (current) use of systemic steroids: Secondary | ICD-10-CM | POA: Diagnosis not present

## 2018-05-29 DIAGNOSIS — B373 Candidiasis of vulva and vagina: Secondary | ICD-10-CM | POA: Diagnosis not present

## 2018-05-29 MED ORDER — FLUCONAZOLE 150 MG PO TABS: 150.0000 mg | ORAL_TABLET | Freq: Every day | ORAL | 0 refills | Status: DC

## 2018-05-29 NOTE — Patient Instructions (Addendum)
Thank you for coming to the clinic today. It was a pleasure to see you.   I have written you a prescription for fluconazole  Please set up an appointment to meed Dr. Eileen Stanford at the front desk, his next available is June 4th  Please call the internal medicine center clinic if you have any questions or concerns, we may be able to help and keep you from a long and expensive emergency room wait. Our clinic and after hours phone number is [phone], the best time to call is Monday through Friday 9 am to 4 pm but there is always someone available 24/7 if you have an emergency. If you need medication refills please notify your pharmacy one week in advance and they will send [name] a request.

## 2018-05-29 NOTE — Telephone Encounter (Signed)
Return call to pt - stated no one had called her to check on her; "I'm sick"; stated no one let her know of office hour change. Stated she called and new office hours are not on our answering service. Stated she has not seen her "new" doctor (no show in Aug). Stated she has a yeast infection - informed she needs to seen, which she's agreeable. Called transferred to front office - ACC appt scheduled. Appt scheduled for today @ 1515 PM.

## 2018-05-29 NOTE — Assessment & Plan Note (Signed)
Patient developed vaginal itching and burning about four days ago. Very similar to prior yeast infections but not as severe this time. She is on prednisone for treatment of optic neuritis and has developed yeast infection from being on this medication in the past. She is not sexually active.  - prescribed fluconazole 150 mg x 1 day

## 2018-05-29 NOTE — Telephone Encounter (Signed)
Requesting to speak with a nurse about something. Please call pt back.  °

## 2018-05-29 NOTE — Progress Notes (Signed)
   CC: symptoms of yeast infection   HPI:  [name] is a 68 y.o. with PMH as listed below who presents for symptoms of yeast infection . Please see the assessment and plans for the status of the patient chronic medical problems.   Past Medical History:  Diagnosis Date  . Anxiety state, unspecified   . Arthritis   . H/O: hysterectomy   . Hypertension   . Morbid obesity ([place])   . Myopia   . NSVT (nonsustained ventricular tachycardia) ([place])    a. 01/2005 Echo: EF 55-65%  . Osteopenia   . Other and unspecified hyperlipidemia   . Palpitations   . Umbilical hernia   . Unspecified essential hypertension    Review of Systems:  Refer to history of present illness and assessment and plans for pertinent review of systems, all others reviewed and negative.  Physical Exam:  Vitals:   05/29/18 1457  BP: (!) 142/87  Pulse: 82  Temp: 98.2 F (36.8 C)  TempSrc: Oral  SpO2: 100%  Height: 5\' 2"  (1.575 m)   General: well appearing, not in acute distress  Cardiac: regular rate and rhythm, no murmur  Abdomen: soft, non tender, non distended   Assessment & Plan:   Candidal vulvovaginitis  Patient developed vaginal itching and burning about four days ago. Very similar to prior yeast infections but not as severe this time. She is on prednisone for treatment of optic neuritis and has developed yeast infection from being on this medication in the past. She is not sexually active.  - prescribed fluconazole 150 mg x 1 day   See Encounters Tab for problem based charting.  Patient discussed with Dr. Dareen Piano

## 2018-05-30 NOTE — Progress Notes (Signed)
Internal Medicine Clinic Attending  Case discussed with Dr. Blum at the time of the visit.  We reviewed the resident's history and exam and pertinent patient test results.  I agree with the assessment, diagnosis, and plan of care documented in the resident's note. 

## 2018-06-04 ENCOUNTER — Ambulatory Visit: Payer: Medicare HMO | Admitting: Diagnostic Neuroimaging

## 2018-06-10 DIAGNOSIS — H469 Unspecified optic neuritis: Secondary | ICD-10-CM | POA: Diagnosis not present

## 2018-06-13 ENCOUNTER — Encounter: Payer: Self-pay | Admitting: Internal Medicine

## 2018-06-13 ENCOUNTER — Other Ambulatory Visit: Payer: Self-pay

## 2018-06-13 ENCOUNTER — Ambulatory Visit (INDEPENDENT_AMBULATORY_CARE_PROVIDER_SITE_OTHER): Payer: Medicare HMO | Admitting: Internal Medicine

## 2018-06-13 VITALS — BP 138/82 | HR 68 | Temp 98.0°F | Ht 62.5 in | Wt 199.3 lb

## 2018-06-13 DIAGNOSIS — I10 Essential (primary) hypertension: Secondary | ICD-10-CM

## 2018-06-13 DIAGNOSIS — G360 Neuromyelitis optica [Devic]: Secondary | ICD-10-CM

## 2018-06-13 DIAGNOSIS — Z79899 Other long term (current) drug therapy: Secondary | ICD-10-CM

## 2018-06-13 NOTE — Assessment & Plan Note (Signed)
#  Neuromyelitis optica: Recently diagnosed after she presented to the hospital in January with an acute right-sided visual loss.  At the hospital, she was treated with IV Solu-Medrol and unfortunately she suffered from visual hallucinations and irritability.  She has not been followed up by neurology (Dr. Leta Baptist).  She tells me she has received IV Rituxan infusion.  Today, she denies blurry vision, dizziness, headaches.  On physical exams, cranial nerves II-III intact.  Plan: -Follow-up with neurology

## 2018-06-13 NOTE — Patient Instructions (Signed)
Ms. Woodlief,  It was a pleasure taking care of you here in the clinic today.  Sorry about the long year of waiting before seeing me.  I sincerely apologize for that.  Seems like you are doing really well now and currently continue to follow-up with neurology.  I am going to get basic lab work today to check on your kidney levels.  Take care! Dr. Eileen Stanford

## 2018-06-13 NOTE — Assessment & Plan Note (Signed)
#  Hypertension:  BP Readings from Last 3 Encounters:  06/13/18 138/82  05/29/18 (!) 142/87  02/22/18 122/64   At goal.  She remains asymptomatic and takes her medication as scheduled.  Plan: -Continue Coreg 25 mg twice daily -Continue spironolactone 25 mg daily -Continue verapamil 60 mg TID -Continue Lasix 40 mg daily

## 2018-06-13 NOTE — Progress Notes (Addendum)
   CC: Follow-up neuromyelitis optica.  HPI:  Ms.Kamiah ROSELENE GRAY is a 68 y.o. woman with medical history listed below presenting today to establish care with her PCP.  Please see problem based charting for further details.  Past Medical History:  Diagnosis Date  . Anxiety state, unspecified   . Arthritis   . H/O: hysterectomy   . Hypertension   . Morbid obesity ([place])   . Myopia   . NSVT (nonsustained ventricular tachycardia) ([place])    a. 01/2005 Echo: EF 55-65%  . Osteopenia   . Other and unspecified hyperlipidemia   . Palpitations   . Umbilical hernia   . Unspecified essential hypertension    Review of Systems: As per HPI  Physical Exam:  Vitals:   06/13/18 1427  BP: 138/82  Pulse: 68  Temp: 98 F (36.7 C)  TempSrc: Oral  SpO2: 99%  Weight: 199 lb 4.8 oz (90.4 kg)  Height: 5' 2.5" (1.588 m)   Physical Exam Constitutional:      General: She is not in acute distress.    Appearance: She is not ill-appearing, toxic-appearing or diaphoretic.  HENT:     Head: Normocephalic and atraumatic.  Eyes:     General: No scleral icterus.       Right eye: No discharge.        Left eye: No discharge.     Extraocular Movements: Extraocular movements intact.     Conjunctiva/sclera: Conjunctivae normal.     Pupils: Pupils are equal, round, and reactive to light.  Cardiovascular:     Rate and Rhythm: Normal rate.     Heart sounds: Normal heart sounds. No murmur.  Pulmonary:     Effort: Pulmonary effort is normal.     Breath sounds: Normal breath sounds. No wheezing.  Neurological:     General: No focal deficit present.     Mental Status: She is alert.     Sensory: No sensory deficit.     Assessment & Plan:   See Encounters Tab for problem based charting.  Patient discussed with Dr. Angelia Mould

## 2018-06-14 LAB — BMP8+ANION GAP
Anion Gap: 13 mmol/L (ref 10.0–18.0)
BUN/Creatinine Ratio: 17 (ref 12–28)
BUN: 21 mg/dL (ref 8–27)
CO2: 25 mmol/L (ref 20–29)
Calcium: 9.6 mg/dL (ref 8.7–10.3)
Chloride: 100 mmol/L (ref 96–106)
Creatinine, Ser: 1.24 mg/dL — ABNORMAL HIGH (ref 0.57–1.00)
GFR calc Af Amer: 51 mL/min/{1.73_m2} — ABNORMAL LOW (ref >59)
GFR calc non Af Amer: 44 mL/min/{1.73_m2} — ABNORMAL LOW (ref >59)
Glucose: 84 mg/dL (ref 65–99)
Potassium: 4.4 mmol/L (ref 3.5–5.2)
Sodium: 138 mmol/L (ref 134–144)

## 2018-06-14 NOTE — Progress Notes (Signed)
Internal Medicine Clinic Attending  Case discussed with Dr. Agyei at the time of the visit.  We reviewed the resident's history and exam and pertinent patient test results.  I agree with the assessment, diagnosis, and plan of care documented in the resident's note.    

## 2018-07-11 ENCOUNTER — Other Ambulatory Visit: Payer: Self-pay | Admitting: Internal Medicine

## 2018-08-07 ENCOUNTER — Other Ambulatory Visit: Payer: Self-pay

## 2018-08-07 ENCOUNTER — Encounter: Payer: Self-pay | Admitting: Internal Medicine

## 2018-08-07 ENCOUNTER — Ambulatory Visit (INDEPENDENT_AMBULATORY_CARE_PROVIDER_SITE_OTHER): Payer: Medicare HMO | Admitting: Internal Medicine

## 2018-08-07 VITALS — BP 145/87 | HR 82 | Temp 98.3°F | Ht 62.5 in | Wt 204.4 lb

## 2018-08-07 DIAGNOSIS — K219 Gastro-esophageal reflux disease without esophagitis: Secondary | ICD-10-CM

## 2018-08-07 DIAGNOSIS — I10 Essential (primary) hypertension: Secondary | ICD-10-CM

## 2018-08-07 DIAGNOSIS — N189 Chronic kidney disease, unspecified: Secondary | ICD-10-CM | POA: Diagnosis not present

## 2018-08-07 DIAGNOSIS — I129 Hypertensive chronic kidney disease with stage 1 through stage 4 chronic kidney disease, or unspecified chronic kidney disease: Secondary | ICD-10-CM | POA: Diagnosis not present

## 2018-08-07 DIAGNOSIS — Z79899 Other long term (current) drug therapy: Secondary | ICD-10-CM

## 2018-08-07 DIAGNOSIS — Z0000 Encounter for general adult medical examination without abnormal findings: Secondary | ICD-10-CM

## 2018-08-07 HISTORY — DX: Gastro-esophageal reflux disease without esophagitis: K21.9

## 2018-08-07 MED ORDER — OMEPRAZOLE 20 MG PO CPDR: 20.0000 mg | DELAYED_RELEASE_CAPSULE | Freq: Every day | ORAL | 3 refills | Status: DC

## 2018-08-07 MED ORDER — OMEPRAZOLE 20 MG PO CPDR: 20.0000 mg | DELAYED_RELEASE_CAPSULE | Freq: Every day | ORAL | 0 refills | Status: DC

## 2018-08-07 NOTE — Assessment & Plan Note (Signed)
Cough: Michelle Hurley states that this morning she started experiencing a dry cough which resolved after she took Alka-Seltzer plus.  She denies fevers, chills, rhinorrhea, postnasal drip, chest pain, shortness of breath, lacrimation.  She does state that over the past week she is experienced symptoms of acid reflux where she feels acidic fluid content in her mouth.  She does love to put spicy sauces on her food.  Differential diagnosis includes gastroesophageal reflux disease.  Cough variant asthma versus ACE inhibitor induced cough versus postnasal drip very less likely.  She does have a history of seasonal allergies however her symptoms does not match up given symptoms of reflux. Zenker diverticulum is also a possibility.  Given her age and new onset reflux symptoms, we would like to try short course of a PPI.  If this does not improve, will discuss obtaining EGD.  Plan: -Trial of omeprazole - If no improvement, will discuss obtaining EGD

## 2018-08-07 NOTE — Patient Instructions (Signed)
Ms. Aguilera,  It was a pleasure seeing you back here in the clinic today.  I am sorry to hear about the cough that you are having.  As we discussed in the room, this could be due to acid reflux and I am going to try you on a medication called omeprazole.  You will take 1 pill every day and monitor your symptoms.  If your symptoms get worse then will think about other diagnostic interventions.  Take care! Dr. Eileen Stanford  Please call the internal medicine center clinic if you have any questions or concerns, we may be able to help and keep you from a long and expensive emergency room wait. Our clinic and after hours phone number is [phone], the best time to call is Monday through Friday 9 am to 4 pm but there is always someone available 24/7 if you have an emergency. If you need medication refills please notify your pharmacy one week in advance and they will send [name] a request.

## 2018-08-07 NOTE — Assessment & Plan Note (Signed)
Essential hypertension: Her current antihypertensives include Coreg 25 mg twice daily, spironolactone 25 mg daily, verapamil 60 mg TID, Lasix 40 mg daily.  The reason why she is on a beta-blocker and calcium channel blocker is her her history of nonsustained V. tach in 2013.  She has been well controlled and denies palpitation, presyncope, headaches since 2013.  She had an echocardiogram done in 2016 which showed an ejection fraction of 55-60%.  Plan: -Continue Coreg 25 mg twice daily -Continue spironolactone 25 mg daily -Continue verapamil 60 mg TID -Continue Lasix 40 mg daily

## 2018-08-07 NOTE — Progress Notes (Signed)
   CC: Follow-up hypertension, chronic kidney disease, health maintenance  HPI:  [name] is a 68 y.o. very pleasant woman with medical history listed below presenting to follow-up on chronic medical problems.  Please see problem based charting for further details.  Past Medical History:  Diagnosis Date  . Allergic rhinitis 04/16/2015  . Anxiety state, unspecified   . Arthritis   . Dizziness 04/06/2012  . H/O: hysterectomy   . Hypertension   . Hypokalemia 02/12/2018  . Morbid obesity ([place])   . Myopia   . NSVT (nonsustained ventricular tachycardia) ([place])    a. 01/2005 Echo: EF 55-65%  . Osteopenia   . Other and unspecified hyperlipidemia   . Palpitations   . Rash 03/23/2017  . Right optic neuritis 01/22/2018  . Sciatica 01/15/2018  . Umbilical hernia   . Unspecified essential hypertension   . Vaginal candidiasis 02/12/2018   Review of Systems:  As per HPI  Physical Exam:  Vitals:   08/07/18 1429  BP: (!) 145/87  Pulse: 82  Temp: 98.3 F (36.8 C)  TempSrc: Oral  SpO2: 100%  Weight: 204 lb 6.4 oz (92.7 kg)  Height: 5' 2.5" (1.588 m)   Physical Exam Constitutional:      Appearance: She is obese.  HENT:     Head: Normocephalic and atraumatic.  Cardiovascular:     Rate and Rhythm: Normal rate.     Heart sounds: Normal heart sounds. No murmur. No friction rub. No gallop.   Pulmonary:     Effort: Pulmonary effort is normal. No respiratory distress.     Breath sounds: Normal breath sounds. No wheezing.  Musculoskeletal:     Right lower leg: No edema.     Left lower leg: No edema.  Neurological:     Mental Status: She is alert.     Assessment & Plan:   See Encounters Tab for problem based charting.  Patient discussed with Dr. Evette Doffing

## 2018-08-07 NOTE — Assessment & Plan Note (Signed)
Health maintenance: Up-to-date 

## 2018-08-08 NOTE — Progress Notes (Signed)
Internal Medicine Clinic Attending  Case discussed with Dr. Agyei at the time of the visit.  We reviewed the resident's history and exam and pertinent patient test results.  I agree with the assessment, diagnosis, and plan of care documented in the resident's note.    

## 2018-08-22 ENCOUNTER — Telehealth: Payer: Self-pay | Admitting: *Deleted

## 2018-08-22 ENCOUNTER — Other Ambulatory Visit: Payer: Self-pay | Admitting: Internal Medicine

## 2018-08-22 DIAGNOSIS — E7800 Pure hypercholesterolemia, unspecified: Secondary | ICD-10-CM

## 2018-08-22 NOTE — Telephone Encounter (Signed)
Pt states she got up early this am, cooked a complete meal for her sister's family, her sister has cancer. Prepared to go to have her hair straightened. Doesn't usually drink milk but does consume dairy products daily but had milk with her medicine.a little later she became diaphoretic, N&V, diarrhea, weak, dizzy. This lasted appr 45 mins. She continues to feel weak. Offered [facility] appt and declines, appt wed 8/19 w/ pcp. She is cautioned that if she has another attack to go to ED or call EMS, she is agreeable

## 2018-08-23 NOTE — Telephone Encounter (Signed)
Thanks Helen  

## 2018-08-28 ENCOUNTER — Ambulatory Visit (HOSPITAL_COMMUNITY)
Admission: RE | Admit: 2018-08-28 | Discharge: 2018-08-28 | Disposition: A | Discharge status: Home / Self Care | Payer: Medicare HMO | Source: Ambulatory Visit | Attending: Internal Medicine | Admitting: Internal Medicine

## 2018-08-28 ENCOUNTER — Other Ambulatory Visit: Payer: Self-pay

## 2018-08-28 ENCOUNTER — Encounter: Payer: Self-pay | Admitting: Internal Medicine

## 2018-08-28 ENCOUNTER — Ambulatory Visit (INDEPENDENT_AMBULATORY_CARE_PROVIDER_SITE_OTHER): Payer: Medicare HMO | Admitting: Internal Medicine

## 2018-08-28 VITALS — BP 138/80 | HR 73 | Temp 99.2°F | Ht 62.5 in | Wt 206.1 lb

## 2018-08-28 DIAGNOSIS — Z79899 Other long term (current) drug therapy: Secondary | ICD-10-CM | POA: Diagnosis not present

## 2018-08-28 DIAGNOSIS — E669 Obesity, unspecified: Secondary | ICD-10-CM

## 2018-08-28 DIAGNOSIS — Z6837 Body mass index (BMI) 37.0-37.9, adult: Secondary | ICD-10-CM

## 2018-08-28 DIAGNOSIS — R197 Diarrhea, unspecified: Secondary | ICD-10-CM | POA: Diagnosis not present

## 2018-08-28 DIAGNOSIS — I10 Essential (primary) hypertension: Secondary | ICD-10-CM

## 2018-08-28 DIAGNOSIS — Z20822 Contact with and (suspected) exposure to covid-19: Secondary | ICD-10-CM

## 2018-08-28 DIAGNOSIS — R6889 Other general symptoms and signs: Secondary | ICD-10-CM | POA: Diagnosis not present

## 2018-08-28 DIAGNOSIS — M25511 Pain in right shoulder: Secondary | ICD-10-CM

## 2018-08-28 DIAGNOSIS — M25512 Pain in left shoulder: Secondary | ICD-10-CM | POA: Diagnosis not present

## 2018-08-28 DIAGNOSIS — K219 Gastro-esophageal reflux disease without esophagitis: Secondary | ICD-10-CM | POA: Diagnosis not present

## 2018-08-28 HISTORY — DX: Pain in right shoulder: M25.511

## 2018-08-28 MED ORDER — ASPIRIN EC 81 MG PO TBEC: 81.0000 mg | DELAYED_RELEASE_TABLET | Freq: Every day | ORAL | 11 refills | Status: DC

## 2018-08-28 NOTE — Progress Notes (Signed)
   CC: Episode of diarrhea and shoulder pain last week  HPI:  [name] is a 68 y.o. very pleasant African-American woman with medical history listed below presenting for evaluation of bilateral shoulder pain and diarrhea which occurred last week.  Please see problem based charting for further details.  Past Medical History:  Diagnosis Date  . Allergic rhinitis 04/16/2015  . Anxiety state, unspecified   . Arthritis   . Dizziness 04/06/2012  . H/O: hysterectomy   . Hypertension   . Hypokalemia 02/12/2018  . Morbid obesity ([place])   . Myopia   . NSVT (nonsustained ventricular tachycardia) ([place])    a. 01/2005 Echo: EF 55-65%  . Osteopenia   . Other and unspecified hyperlipidemia   . Palpitations   . Rash 03/23/2017  . Right optic neuritis 01/22/2018  . Sciatica 01/15/2018  . Umbilical hernia   . Unspecified essential hypertension   . Vaginal candidiasis 02/12/2018   Review of Systems: As per HPI  Physical Exam:  Vitals:   08/28/18 0854  BP: 138/80  Pulse: 73  Temp: 99.2 F (37.3 C)  TempSrc: Oral  SpO2: 100%  Weight: 206 lb 1.6 oz (93.5 kg)  Height: 5' 2.5" (1.588 m)   Physical Exam  Constitutional: She is well-developed, well-nourished, and in no distress. No distress.  HENT:  Head: Normocephalic and atraumatic.  Cardiovascular: Normal rate, regular rhythm and normal heart sounds.  No murmur heard. Pulmonary/Chest: Effort normal and breath sounds normal. She has no wheezes. She has no rales.  Abdominal: Soft. Bowel sounds are normal. There is no abdominal tenderness.  Skin: She is not diaphoretic.  Nursing note and vitals reviewed.   Assessment & Plan:   See Encounters Tab for problem based charting.  Patient discussed with Dr. Lynnae January

## 2018-08-28 NOTE — Assessment & Plan Note (Signed)
Concern for CAD: Michelle Hurley states she was congested of health until 6 days ago when she had a single episode of bilateral shoulder and neck tightness that was associated with nausea, diarrhea, diaphoresis and nonbilious nonbloody emesis.  During the onset of symptoms, she also experienced dizziness, lightheadedness and sensation of feeling "hot all over her body.  "Symptoms improved gradually and has not had any other episodes.  She denies dyspnea on exertion, chest pain on exertion but does report that she feels a little tired.  She denies any sick contacts but does say her children recently traveled from [place] to live in [place] and she has had contact with them.  She adheres to all coronavirus guidelines.  On physical exams, cardiac and lung auscultation were unremarkable.  EKG performed at the clinic shows sinus rhythm with first-degree AV block which is unchanged from her last EKG in 2016.  There is no acute ST or T wave abnormalities.  On chart review, she had a myocardial perfusion test done in 2007 which was unremarkable.  Given that she is at high risk for CAD as she has hypertension, obesity and rheumatologic disease (neuromyelitis optica), I will refer her to cardiology for further ischemic evaluation.  Plan: - Refer to cardiology for exercise stress test -Start low-dose aspirin

## 2018-08-28 NOTE — Patient Instructions (Signed)
Michelle Hurley,  It was a pleasure taking care of your the clinic today.  Regarding the episode of nausea, diarrhea and sweatiness you had last week the EKG we did at the clinic looks fine but I would like for you to get further testing to make sure that the blood vessels of your heart are looking fine.  I am also sending you to have the coronavirus test.  For the acid reflux, you can increase the omeprazole to 2 pills every day until you run out of the medication then I would give you a separate prescription for 40 mg daily.  Take care. Dr. Eileen Stanford  Please call the internal medicine center clinic if you have any questions or concerns, we may be able to help and keep you from a long and expensive emergency room wait. Our clinic and after hours phone number is [phone], the best time to call is Monday through Friday 9 am to 4 pm but there is always someone available 24/7 if you have an emergency. If you need medication refills please notify your pharmacy one week in advance and they will send [name] a request.

## 2018-08-29 LAB — NOVEL CORONAVIRUS, NAA: SARS-CoV-2, NAA: NOT DETECTED

## 2018-08-30 NOTE — Progress Notes (Signed)
Internal Medicine Clinic Attending  Case discussed with Dr. Agyei at the time of the visit.  We reviewed the resident's history and exam and pertinent patient test results.  I agree with the assessment, diagnosis, and plan of care documented in the resident's note.    

## 2018-09-09 ENCOUNTER — Telehealth: Payer: Self-pay | Admitting: *Deleted

## 2018-09-09 NOTE — Telephone Encounter (Signed)
Hi Glenda,   I don't think she requires further evaluation due to the recent CDC guidelines not to test asymptomatic patients.  However I do not mind sending her to the [facility] testing center if she is anxious to get tested.  I would advise that she self quarantine.  Thanks!

## 2018-09-09 NOTE — Telephone Encounter (Signed)
Called pt - informed Dr Eileen Stanford stated he does not recommend being re-tested according to recent CDC guidelines and since she doesn't have any symptoms. But to self quarantine. Stated she understands and she doesn't go anywhere. And will call if she dev any problems.

## 2018-09-09 NOTE — Telephone Encounter (Signed)
Call from pt - stated several members of her family were tested positive for Covis 19 on last Thursday. Last time she was tested on 8/19 - negative. But her sister was not feeling well so went to see her; stayed her distance, no contact - sister was tested on Thurs and was told this morning, she's +. Ms Mcnaught denies having symptoms- sob,cp,fever,cough. She wants to know if she needs to be tested again? Thanks

## 2018-09-19 ENCOUNTER — Encounter: Payer: Self-pay | Admitting: *Deleted

## 2018-09-24 ENCOUNTER — Telehealth: Payer: Self-pay | Admitting: Internal Medicine

## 2018-09-24 NOTE — Telephone Encounter (Signed)
Pt called and is concerned appt her CVD with the [place] on 10/24/2018.  Pt states she has been waiting to get in since August and wants to know if she should be seen sooner than that.  Please advise.

## 2018-09-25 NOTE — Telephone Encounter (Signed)
Hi Chilon,  I think her appointment next month is ok.   Thanks

## 2018-09-25 NOTE — Telephone Encounter (Signed)
Pt called back and confirmed okay to wait until 10/24/2018 to be seen.  Patient was given the number [phone] to contact [facility] if any questions about a cancellation or sooner appointment.

## 2018-09-25 NOTE — Telephone Encounter (Signed)
Okay.  I will call the patient back and let her know.

## 2018-09-25 NOTE — Telephone Encounter (Signed)
Called patient [facility] for patient to call back.

## 2018-10-03 ENCOUNTER — Other Ambulatory Visit: Payer: Self-pay | Admitting: Internal Medicine

## 2018-10-04 ENCOUNTER — Other Ambulatory Visit: Payer: Self-pay | Admitting: Internal Medicine

## 2018-10-04 DIAGNOSIS — K219 Gastro-esophageal reflux disease without esophagitis: Secondary | ICD-10-CM

## 2018-10-04 MED ORDER — OMEPRAZOLE 40 MG PO CPDR: 40.0000 mg | DELAYED_RELEASE_CAPSULE | Freq: Every day | ORAL | 0 refills | Status: DC

## 2018-10-24 ENCOUNTER — Ambulatory Visit (INDEPENDENT_AMBULATORY_CARE_PROVIDER_SITE_OTHER): Payer: Medicare HMO | Admitting: Cardiology

## 2018-10-24 ENCOUNTER — Other Ambulatory Visit: Payer: Self-pay

## 2018-10-24 ENCOUNTER — Encounter: Payer: Self-pay | Admitting: Cardiology

## 2018-10-24 VITALS — BP 110/70 | HR 70 | Ht 62.5 in | Wt 205.0 lb

## 2018-10-24 DIAGNOSIS — I10 Essential (primary) hypertension: Secondary | ICD-10-CM

## 2018-10-24 DIAGNOSIS — Z01812 Encounter for preprocedural laboratory examination: Secondary | ICD-10-CM

## 2018-10-24 DIAGNOSIS — R55 Syncope and collapse: Secondary | ICD-10-CM

## 2018-10-24 DIAGNOSIS — R0789 Other chest pain: Secondary | ICD-10-CM

## 2018-10-24 DIAGNOSIS — I700 Atherosclerosis of aorta: Secondary | ICD-10-CM

## 2018-10-24 MED ORDER — METOPROLOL TARTRATE 50 MG PO TABS: 50.0000 mg | ORAL_TABLET | Freq: Once | ORAL | 0 refills | Status: DC

## 2018-10-24 NOTE — Progress Notes (Signed)
Cardiology Office Note:    Date:  10/24/2018   ID:  Michelle Hurley, DOB 1950-06-09, MRN [id number]  PCP:  Jean Rosenthal, MD  Cardiologist:  Candee Furbish, MD  Electrophysiologist:  None   Referring MD: Bartholomew Crews, MD     History of Present Illness:    Michelle Hurley is a 68 y.o. female for the evaluation of chest pain at the request of Dr. Lynnae January.  She is previously seen on 08/28/2018, office note reviewed where she was complaining of episode of bilateral shoulder and neck tightness associated with nausea diarrhea diaphoresis and emesis.  She felt dizziness lightheadedness and hot sensation all over her body. Got to bathroom but felt like she blacked out. Thankfully, she has not had any more episodes. EKG demonstrated sinus rhythm with first-degree AV block unchanged from 2016.  No acute changes.  Stress test several years ago in 2007 was unremarkable.  She does have risk factors of hypertension obesity age rheumatologic disease and therefore cardiology consult was instructed.  She was placed on low-dose aspirin.  Felt hot and dizzy Monday. No real pain, no emesis. Was out shoppring for groceries.   Past Medical History:  Diagnosis Date  . Abdominal pain 05/22/2014  . Allergic rhinitis 04/16/2015  . Anxiety state, unspecified   . ARTHRALGIA UNSPECIFIED SITE 12/04/2008   Qualifier: Diagnosis of  By: Melvyn Novas MD, Christena Deem   . Arthritis   . Atherosclerosis of abdominal aorta ([place]) 10/01/2015   Aortoiliac atherosclerosis noted on CT abdomen in 2016.  Marland Kitchen Cough with congestion of paranasal sinus 06/01/2015  . Dizziness 04/06/2012  . Essential hypertension 12/24/2006  . Estrogen deficiency 11/16/2016  . GERD (gastroesophageal reflux disease) 08/07/2018  . H/O: hysterectomy   . Hx-TIA (transient ischemic attack) 04/06/2012  . Hypertension   . Hypokalemia 02/12/2018  . Influenza-like illness 02/02/2016  . Loss of part of visual field   . Morbid obesity ([place])   . Myopia   . Nausea  with vomiting 05/23/2014  . 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 NSVT (nonsustained ventricular tachycardia) ([place])    a. 01/2005 Echo: EF 55-65%  . Osteopenia   . Other and unspecified hyperlipidemia   . Palpitations   . Plantar fasciitis, bilateral 06/12/2016  . Rash 03/23/2017  . Right ankle injury, initial encounter 06/12/2016  . Right elbow pain 06/11/2017  . Right leg pain 09/06/2016  . Right optic neuritis 01/22/2018  . Sciatica 01/15/2018  . Shoulder pain, bilateral (Concern for CAD) 08/28/2018  . Umbilical hernia   . Unspecified essential hypertension   . Vaginal candidiasis 02/12/2018  . VENTRICULAR TACHYCARDIA 12/04/2008   F/u  [facility]    . Viral upper respiratory tract infection 01/15/2018  . Vitamin D deficiency 02/12/2018    Past Surgical History:  Procedure Laterality Date  . ABDOMINAL HYSTERECTOMY  1974  . ESOPHAGOGASTRODUODENOSCOPY N/A 05/28/2014   Procedure: ESOPHAGOGASTRODUODENOSCOPY (EGD);  Surgeon: Teena Irani, MD;  Location: [facility];  Service: Endoscopy;  Laterality: N/A;  . LAPAROSCOPIC SMALL BOWEL RESECTION N/A 06/04/2014   Procedure: LAPAROSCOPIC SMALL BOWEL RESECTION;  Surgeon: Fanny Skates, MD;  Location: [facility];  Service: General;  Laterality: N/A;  . LAPAROSCOPIC TRANSABDOMINAL HERNIA N/A 05/03/2014   Procedure: LAPAROSCOPIC EXPLORATION WITH OPEN REPAIR OF INCARCERATED EPIGASTRIC HERNIA ;  Surgeon: Alphonsa Overall, MD;  Location: [facility];  Service: General;  Laterality: N/A;    Current Medications: Current Meds  Medication Sig  . aspirin EC 81 MG tablet Take 1 tablet (81  mg total) by mouth daily.  . carvedilol (COREG) 25 MG tablet TAKE 1 TABLET   2 (TWO) TIMES DAILY WITH A MEAL.  . furosemide (LASIX) 40 MG tablet TAKE 1 TABLET (40 MG TOTAL) BY MOUTH DAILY.  Marland Kitchen lovastatin (MEVACOR) 20 MG tablet TAKE 1 TABLET AT BEDTIME  . omeprazole (PRILOSEC) 40 MG capsule Take 1 capsule (40 mg total) by mouth daily.  . polyethylene glycol powder (GLYCOLAX/MIRALAX) powder Take  17 g by mouth daily as needed for moderate constipation or severe constipation.  Marland Kitchen RITUXAN 500 MG/50ML injection 05/14/18 not started yet  . spironolactone (ALDACTONE) 25 MG tablet TAKE 1 TABLET EVERY DAY  . verapamil (CALAN) 120 MG tablet TAKE 1/2 TABLET THREE TIMES DAILY     Allergies:   Mushroom extract complex, Codeine, Hydrocodone, and Prednisone   Social History   Socioeconomic History  . Marital status: Married    Spouse name: Not on file  . Number of children: Not on file  . Years of education: Not on file  . Highest education level: Not on file  Occupational History  . Occupation: Retired  Scientific laboratory technician  . Financial resource strain: Not on file  . Food insecurity    Worry: Not on file    Inability: Not on file  . Transportation needs    Medical: Not on file    Non-medical: Not on file  Tobacco Use  . Smoking status: Never Smoker  . Smokeless tobacco: Never Used  Substance and Sexual Activity  . Alcohol use: No    Alcohol/week: 0.0 standard drinks  . Drug use: No  . Sexual activity: Not on file  Lifestyle  . Physical activity    Days per week: Not on file    Minutes per session: Not on file  . Stress: Not on file  Relationships  . Social Herbalist on phone: Not on file    Gets together: Not on file    Attends religious service: Not on file    Active member of club or organization: Not on file    Attends meetings of clubs or organizations: Not on file    Relationship status: Not on file  Other Topics Concern  . Not on file  Social History Narrative   ** Merged History Encounter **  Lives home alone.  Divorced x 2.  College.  Medical office assistant hx.           Family History: The patient's family history includes Diabetes in her mother and sister; Heart attack in her brother; Heart disease in her mother; Hypertension in her brother; Lung cancer in her mother; Stroke in her sister; Thyroid cancer in her sister. There is no history of Breast  cancer.  ROS:   Please see the history of present illness.    No fevers chills nausea vomiting syncope bleeding all other systems reviewed and are negative.  EKGs/Labs/Other Studies Reviewed:    The following studies were reviewed today:  ECHO 2016 - Left ventricle: The cavity size was normal. There was mild focal   basal hypertrophy of the septum. Systolic function was normal.   The estimated ejection fraction was in the range of 55% to 60%.   Wall motion was normal; there were no regional wall motion   abnormalities. Left ventricular diastolic function parameters   were normal. - Mitral valve: There was mild regurgitation. - Left atrium: The atrium was mildly dilated.  EKG: 08/28/2018-heart rate 68 bpm sinus rhythm with first-degree  AV block PR interval 222 ms otherwise normal.  Personally reviewed  Recent Labs: 02/27/2018: ALT 18; Hemoglobin 12.9; Platelets 272 06/13/2018: BUN 21; Creatinine, Ser 1.24; Potassium 4.4; Sodium 138  Recent Lipid Panel    Component Value Date/Time   CHOL 161 11/19/2014 1131   TRIG 93 11/19/2014 1131   HDL 46 11/19/2014 1131   CHOLHDL 3.5 11/19/2014 1131   VLDL 19 11/19/2014 1131   LDLCALC 96 11/19/2014 1131   LDLDIRECT 182.8 04/27/2010 1037    Physical Exam:    VS:  BP 110/70   Pulse 70   Ht 5' 2.5" (1.588 m)   Wt 205 lb (93 kg)   LMP 04/05/2012   SpO2 97%   BMI 36.90 kg/m     Wt Readings from Last 3 Encounters:  10/24/18 205 lb (93 kg)  08/28/18 206 lb 1.6 oz (93.5 kg)  08/07/18 204 lb 6.4 oz (92.7 kg)     GEN:  Well nourished, well developed in no acute distress HEENT: Normal NECK: No JVD; No carotid bruits LYMPHATICS: No lymphadenopathy CARDIAC: RRR, no murmurs, rubs, gallops RESPIRATORY:  Clear to auscultation without rales, wheezing or rhonchi  ABDOMEN: Soft, non-tender, non-distended MUSCULOSKELETAL:  No edema; No deformity  SKIN: Warm and dry NEUROLOGIC:  Alert and oriented x 3 PSYCHIATRIC:  Normal affect    ASSESSMENT:    1. Atypical chest pain   2. Vagal reaction   3. Aortic atherosclerosis ([place])   4. Essential hypertension   5. Pre-procedure lab exam    PLAN:    In order of problems listed above:  Chest discomfort with cardiac risk factors -We will go ahead and proceed with cardiac CT for further evaluation of coronary anatomy.  We will check an echocardiogram to ensure proper structure and function.  Vagal-like response -This was a new sensation for her.  I wonder given her blood pressure 110/70 today if perhaps we are overmedicating with the furosemide and spironolactone in addition to the Coreg and verapamil.  I would suggest to keep an eye on her blood pressures and if very low, consider pulling back on spironolactone for instance.  I have also instructed her to lay down with her feet up if she does begin to feel any of these sensations.  Aortic atherosclerosis -Continue with secondary prevention.  Seen previously abdominal aorta.  She is on lovastatin 20 mg.  Aspirin 81.  Essential hypertension -On carvedilol 25 mg twice a day, verapamil 60 mg 3 times a day, furosemide 40 mg once a day, spironolactone 25 mg once a day.  Her blood pressure today is 110/70.  Certainly some of these episodes could be surrounding transient hypotension.  If her blood pressure remains in this value, I would suggest that the spironolactone first be discontinued.  Watch these numbers.  We do not want to over shoot her blood pressure.  Medication Adjustments/Labs and Tests Ordered: Current medicines are reviewed at length with the patient today.  Concerns regarding medicines are outlined above.  Orders Placed This Encounter  Procedures  . CT CORONARY MORPH W/CTA COR W/SCORE W/CA W/CM &/OR WO/CM  . CT CORONARY FRACTIONAL FLOW RESERVE DATA PREP  . CT CORONARY FRACTIONAL FLOW RESERVE FLUID ANALYSIS  . Basic metabolic panel  . ECHOCARDIOGRAM COMPLETE   Meds ordered this encounter  Medications  .  metoprolol tartrate (LOPRESSOR) 50 MG tablet    Sig: Take 1 tablet (50 mg total) by mouth once for 1 dose. Take (1) tablet 2 hours before your Coronary  CT    Dispense:  1 tablet    Refill:  0    Patient Instructions  Medication Instructions:  The current medical regimen is effective;  continue present plan and medications.  *If you need a refill on your cardiac medications before your next appointment, please call your pharmacy*  Lab Work: Please have blood work before your CT scan. If you have labs (blood work) drawn today and your tests are completely normal, you will receive your results only by: Marland Kitchen MyChart Message (if you have MyChart) OR . A paper copy in the mail If you have any lab test that is abnormal or we need to change your treatment, we will call you to review the results.  Testing/Procedures: Your physician has requested that you have an echocardiogram. Echocardiography is a painless test that uses sound waves to create images of your heart. It provides your doctor with information about the size and shape of your heart and how well your heart's chambers and valves are working. This procedure takes approximately one hour. There are no restrictions for this procedure.  Your physician has requested that you have Coronary CT. Cardiac computed tomography (CT) is a painless test that uses an x-ray machine to take clear, detailed pictures of your heart. For further information please visit [facility]uge[facility]iest[facility]tn. Please follow instruction sheet as given.  Follow-Up: At [facility], you and your health needs are our priority.  As part of our continuing mission to provide you with exceptional heart care, we have created designated Provider Care Teams.  These Care Teams include your primary Cardiologist (physician) and Advanced Practice Providers (APPs -  Physician Assistants and Nurse Practitioners) who all work together to provide you with the care you need, when you need it.  Your  next appointment:   To be determined after your testing.  The format for your next appointment:   In Person  Provider:   You may see Candee Furbish, MD or one of the following Advanced Practice Providers on your designated Care Team:    Truitt Merle, NP  Cecilie Kicks, NP  Kathyrn Drown, NP   Thank you for choosing [facility]!!    Your cardiac CT will be scheduled at one of the below locations:   [facility] [address] [place],  [address] [phone]  Please arrive at the [facility] main entrance of [facility] 30-45 minutes prior to test start time. Proceed to the [facility] Radiology Department (first floor) to check-in and test prep.   Please follow these instructions carefully (unless otherwise directed):   On the Night Before the Test: . Be sure to Drink plenty of water. . Do not consume any caffeinated/decaffeinated beverages or chocolate 12 hours prior to your test. . Do not take any antihistamines 12 hours prior to your test.  On the Day of the Test: . Drink plenty of water. Do not drink any water within one hour of the test. . Do not eat any food 4 hours prior to the test. . You may take your regular medications prior to the test.  . Take metoprolol (Lopressor) two hours prior to test. . HOLD Furosemide/Hydrochlorothiazide morning of the test. . FEMALES- please wear underwire-free bra if available      After the Test: . Drink plenty of water. . After receiving IV contrast, you may experience a mild flushed feeling. This is normal. . On occasion, you may experience a mild rash up to 24 hours after  the test. This is not dangerous. If this occurs, you can take Benadryl 25 mg and increase your fluid intake. . If you experience trouble breathing, this can be serious. If it is severe call 911 IMMEDIATELY. If it is mild, please call our office. . If you take any of these medications: Glipizide/Metformin, Avandament,  Glucavance, please do not take 48 hours after completing test unless otherwise instructed.  Please contact the cardiac imaging nurse navigator should you have any questions/concerns Marchia Bond, RN Navigator Cardiac Imaging [facility] Heart and Vascular Services [phone] Office       Signed, Candee Furbish, MD  10/24/2018 9:29 AM    [place]

## 2018-10-24 NOTE — Patient Instructions (Addendum)
Medication Instructions:  The current medical regimen is effective;  continue present plan and medications.  *If you need a refill on your cardiac medications before your next appointment, please call your pharmacy*  Lab Work: Please have blood work before your CT scan. If you have labs (blood work) drawn today and your tests are completely normal, you will receive your results only by: Marland Kitchen MyChart Message (if you have MyChart) OR . A paper copy in the mail If you have any lab test that is abnormal or we need to change your treatment, we will call you to review the results.  Testing/Procedures: Your physician has requested that you have an echocardiogram. Echocardiography is a painless test that uses sound waves to create images of your heart. It provides your doctor with information about the size and shape of your heart and how well your heart's chambers and valves are working. This procedure takes approximately one hour. There are no restrictions for this procedure.  Your physician has requested that you have Coronary CT. Cardiac computed tomography (CT) is a painless test that uses an x-ray machine to take clear, detailed pictures of your heart. For further information please visit [facility]iest[facility][email or web address]. Please follow instruction sheet as given.  Follow-Up: At [facility], you and your health needs are our priority.  As part of our continuing mission to provide you with exceptional heart care, we have created designated Provider Care Teams.  These Care Teams include your primary Cardiologist (physician) and Advanced Practice Providers (APPs -  Physician Assistants and Nurse Practitioners) who all work together to provide you with the care you need, when you need it.  Your next appointment:   To be determined after your testing.  The format for your next appointment:   In Person  Provider:   You may see Candee Furbish, MD or one of the following Advanced Practice Providers on your  designated Care Team:    Truitt Merle, NP  Cecilie Kicks, NP  Kathyrn Drown, NP   Thank you for choosing [facility]!!    Your cardiac CT will be scheduled at one of the below locations:   [facility] [address] [place], [place] [address] [phone]  Please arrive at the [facility] main entrance of [facility] 30-45 minutes prior to test start time. Proceed to the [facility] Radiology Department (first floor) to check-in and test prep.   Please follow these instructions carefully (unless otherwise directed):   On the Night Before the Test: . Be sure to Drink plenty of water. . Do not consume any caffeinated/decaffeinated beverages or chocolate 12 hours prior to your test. . Do not take any antihistamines 12 hours prior to your test.  On the Day of the Test: . Drink plenty of water. Do not drink any water within one hour of the test. . Do not eat any food 4 hours prior to the test. . You may take your regular medications prior to the test.  . Take metoprolol (Lopressor) two hours prior to test. . HOLD Furosemide/Hydrochlorothiazide morning of the test. . FEMALES- please wear underwire-free bra if available      After the Test: . Drink plenty of water. . After receiving IV contrast, you may experience a mild flushed feeling. This is normal. . On occasion, you may experience a mild rash up to 24 hours after the test. This is not dangerous. If this occurs, you can take Benadryl 25 mg and increase your fluid intake. Marland Kitchen  If you experience trouble breathing, this can be serious. If it is severe call 911 IMMEDIATELY. If it is mild, please call our office. . If you take any of these medications: Glipizide/Metformin, Avandament, Glucavance, please do not take 48 hours after completing test unless otherwise instructed.  Please contact the cardiac imaging nurse navigator should you have any questions/concerns Marchia Bond, RN Navigator Cardiac  Imaging [facility] Heart and Vascular Services [phone] Office

## 2018-10-30 ENCOUNTER — Other Ambulatory Visit: Payer: Self-pay

## 2018-10-30 ENCOUNTER — Ambulatory Visit (HOSPITAL_COMMUNITY): Payer: Medicare HMO | Attending: Cardiology

## 2018-10-30 DIAGNOSIS — R0789 Other chest pain: Secondary | ICD-10-CM | POA: Diagnosis not present

## 2018-10-30 DIAGNOSIS — I10 Essential (primary) hypertension: Secondary | ICD-10-CM | POA: Insufficient documentation

## 2018-11-18 ENCOUNTER — Ambulatory Visit (INDEPENDENT_AMBULATORY_CARE_PROVIDER_SITE_OTHER): Payer: Medicare HMO | Admitting: Diagnostic Neuroimaging

## 2018-11-18 ENCOUNTER — Encounter: Payer: Self-pay | Admitting: Diagnostic Neuroimaging

## 2018-11-18 ENCOUNTER — Other Ambulatory Visit: Payer: Self-pay

## 2018-11-18 VITALS — BP 144/89 | HR 67 | Temp 97.8°F | Ht 62.5 in | Wt 203.8 lb

## 2018-11-18 DIAGNOSIS — G360 Neuromyelitis optica [Devic]: Secondary | ICD-10-CM | POA: Diagnosis not present

## 2018-11-18 NOTE — Addendum Note (Signed)
Addended by: Inis Sizer D on: 11/18/2018 01:58 PM   Modules accepted: Orders

## 2018-11-18 NOTE — Progress Notes (Signed)
[facility]  PATIENT: Michelle Hurley DOB: 1950/05/28  REFERRING CLINICIAN: ER  HISTORY FROM: patient and chart REASON FOR VISIT: follow up   HISTORICAL  CHIEF COMPLAINT:  Chief Complaint  Patient presents with  . Neuromyelitis optica    rm 7, 6 month FU, "next Rituxan on 12/10/18; month ago I had episode of nausea/vomiting/diarrhea, I blacked out, later called my dr"    HISTORY OF PRESENT ILLNESS:   UPDATE (11/18/18, VRP): Since last visit, doing well. Symptoms are improved. Right eye vision almost back to normal. No alleviating or aggravating factors. Tolerating rituxan; next dose in Dec 2020.   VIDEO VISIT UPDATE (05/15/18, VRP):  - overall feels well - vision is improved - no pain - waiting on insurance approval for rituxan  PRIOR HPI (01/30/18): 68 year old female with hypertension, here for evaluation of right retrobulbar, idiopathic optic neuritis.  01/22/2018 patient presented to the hospital after seeing her ophthalmologist for new onset right eye vision loss.  She reported an altitudinal superior visual field defect with some pain.  No problems in the left eye.  Upon admission patient had MRI of the brain and orbits which showed enhancing right optic nerve lesion.  Mild chronic small vessel ischemic disease was also noted as well as scattered chronic cerebral microhemorrhages.  Patient was diagnosed with idiopathic optic neuritis.  She was treated with IV Solu-Medrol.  She had some side effects of hallucinations and irritability.  She completed her course and was discharged home.  Since that time symptoms are continuing to improve.  No problems with arms, legs, balance, bowel or bladder dysfunction.   REVIEW OF SYSTEMS: Full 14 system review of systems performed and negative with exception of: as per HPI.   ALLERGIES: Allergies  Allergen Reactions  . Mushroom Extract Complex Anaphylaxis, Itching and Swelling    Swelling all over body   . Codeine  Nausea And Vomiting  . Hydrocodone Nausea And Vomiting  . Prednisone Nausea And Vomiting    HOME MEDICATIONS: Outpatient Medications Prior to Visit  Medication Sig Dispense Refill  . aspirin EC 81 MG tablet Take 1 tablet (81 mg total) by mouth daily. 30 tablet 11  . carvedilol (COREG) 25 MG tablet TAKE 1 TABLET   2 (TWO) TIMES DAILY WITH A MEAL. 180 tablet 1  . furosemide (LASIX) 40 MG tablet TAKE 1 TABLET (40 MG TOTAL) BY MOUTH DAILY. 90 tablet 1  . lovastatin (MEVACOR) 20 MG tablet TAKE 1 TABLET AT BEDTIME 90 tablet 1  . omeprazole (PRILOSEC) 40 MG capsule Take 1 capsule (40 mg total) by mouth daily. 90 capsule 0  . polyethylene glycol powder (GLYCOLAX/MIRALAX) powder Take 17 g by mouth daily as needed for moderate constipation or severe constipation. 255 g 0  . RITUXAN 500 MG/50ML injection 05/14/18 not started yet    . spironolactone (ALDACTONE) 25 MG tablet TAKE 1 TABLET EVERY DAY 90 tablet 1  . verapamil (CALAN) 120 MG tablet TAKE 1/2 TABLET THREE TIMES DAILY 135 tablet 3  . metoprolol tartrate (LOPRESSOR) 50 MG tablet Take 1 tablet (50 mg total) by mouth once for 1 dose. Take (1) tablet 2 hours before your Coronary CT 1 tablet 0   No facility-administered medications prior to visit.     PAST MEDICAL HISTORY: Past Medical History:  Diagnosis Date  . Abdominal pain 05/22/2014  . Allergic rhinitis 04/16/2015  . Anxiety state, unspecified   . ARTHRALGIA UNSPECIFIED SITE 12/04/2008   Qualifier: Diagnosis of  By: Melvyn Novas MD,  Christena Deem   . Arthritis   . Atherosclerosis of abdominal aorta ([place]) 10/01/2015   Aortoiliac atherosclerosis noted on CT abdomen in 2016.  Marland Kitchen Cough with congestion of paranasal sinus 06/01/2015  . Dizziness 04/06/2012  . Essential hypertension 12/24/2006  . Estrogen deficiency 11/16/2016  . GERD (gastroesophageal reflux disease) 08/07/2018  . H/O: hysterectomy   . Hx-TIA (transient ischemic attack) 04/06/2012  . Hypertension   . Hypokalemia 02/12/2018  . Influenza-like  illness 02/02/2016  . Loss of part of visual field   . Morbid obesity ([place])   . Myopia   . Nausea with vomiting 05/23/2014  . 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 NSVT (nonsustained ventricular tachycardia) ([place])    a. 01/2005 Echo: EF 55-65%  . Osteopenia   . Other and unspecified hyperlipidemia   . Palpitations   . Plantar fasciitis, bilateral 06/12/2016  . Rash 03/23/2017  . Right ankle injury, initial encounter 06/12/2016  . Right elbow pain 06/11/2017  . Right leg pain 09/06/2016  . Right optic neuritis 01/22/2018  . Sciatica 01/15/2018  . Shoulder pain, bilateral (Concern for CAD) 08/28/2018  . Umbilical hernia   . Unspecified essential hypertension   . Vaginal candidiasis 02/12/2018  . VENTRICULAR TACHYCARDIA 12/04/2008   F/u  [facility]hc Hochrein    . Viral upper respiratory tract infection 01/15/2018  . Vitamin D deficiency 02/12/2018    PAST SURGICAL HISTORY: Past Surgical History:  Procedure Laterality Date  . ABDOMINAL HYSTERECTOMY  1974  . ESOPHAGOGASTRODUODENOSCOPY N/A 05/28/2014   Procedure: ESOPHAGOGASTRODUODENOSCOPY (EGD);  Surgeon: Teena Irani, MD;  Location: [facility];  Service: Endoscopy;  Laterality: N/A;  . LAPAROSCOPIC SMALL BOWEL RESECTION N/A 06/04/2014   Procedure: LAPAROSCOPIC SMALL BOWEL RESECTION;  Surgeon: Fanny Skates, MD;  Location: [facility];  Service: General;  Laterality: N/A;  . LAPAROSCOPIC TRANSABDOMINAL HERNIA N/A 05/03/2014   Procedure: LAPAROSCOPIC EXPLORATION WITH OPEN REPAIR OF INCARCERATED EPIGASTRIC HERNIA ;  Surgeon: Alphonsa Overall, MD;  Location: [facility];  Service: General;  Laterality: N/A;    FAMILY HISTORY: Family History  Problem Relation Age of Onset  . Heart disease Mother   . Diabetes Mother   . Lung cancer Mother   . Thyroid cancer Sister   . Diabetes Sister   . Hypertension Brother   . Heart attack Brother   . Stroke Sister   . Breast cancer Neg Hx     SOCIAL HISTORY: Social History   Socioeconomic History  . Marital status: Married     Spouse name: Not on file  . Number of children: Not on file  . Years of education: Not on file  . Highest education level: Not on file  Occupational History  . Occupation: Retired  Scientific laboratory technician  . Financial resource strain: Not on file  . Food insecurity    Worry: Not on file    Inability: Not on file  . Transportation needs    Medical: Not on file    Non-medical: Not on file  Tobacco Use  . Smoking status: Never Smoker  . Smokeless tobacco: Never Used  Substance and Sexual Activity  . Alcohol use: No    Alcohol/week: 0.0 standard drinks  . Drug use: No  . Sexual activity: Not on file  Lifestyle  . Physical activity    Days per week: Not on file    Minutes per session: Not on file  . Stress: Not on file  Relationships  . Social connections    Talks on phone: Not on file  Gets together: Not on file    Attends religious service: Not on file    Active member of club or organization: Not on file    Attends meetings of clubs or organizations: Not on file    Relationship status: Not on file  . Intimate partner violence    Fear of current or ex partner: Not on file    Emotionally abused: Not on file    Physically abused: Not on file    Forced sexual activity: Not on file  Other Topics Concern  . Not on file  Social History Narrative   ** Merged History Encounter **  Lives home alone.  Divorced x 2.  College.  Medical office assistant hx.           PHYSICAL EXAM  GENERAL EXAM/CONSTITUTIONAL: Vitals:  Vitals:   11/18/18 1306  BP: (!) 144/89  Pulse: 67  Temp: 97.8 F (36.6 C)  Weight: 203 lb 12.8 oz (92.4 kg)  Height: 5' 2.5" (1.588 m)   Body mass index is 36.68 kg/m. Wt Readings from Last 3 Encounters:  11/18/18 203 lb 12.8 oz (92.4 kg)  10/24/18 205 lb (93 kg)  08/28/18 206 lb 1.6 oz (93.5 kg)    Hearing Screening   125Hz  250Hz  500Hz  1000Hz  2000Hz  3000Hz  4000Hz  6000Hz  8000Hz   Right ear:           Left ear:             Visual Acuity Screening    Right eye Left eye Both eyes  Without correction: 20/30 20/30   With correction:         Patient is in no distress; well developed, nourished and groomed; neck is supple  CARDIOVASCULAR:  Examination of carotid arteries is normal; no carotid bruits  Regular rate and rhythm, no murmurs  Examination of peripheral vascular system by observation and palpation is normal  EYES:  Ophthalmoscopic exam of optic discs and posterior segments is normal; no papilledema or hemorrhages No exam data present  MUSCULOSKELETAL:  Gait, strength, tone, movements noted in Neurologic exam below  NEUROLOGIC: MENTAL STATUS:  No flowsheet data found.  awake, alert, oriented to person, place and time  recent and remote memory intact  normal attention and concentration  language fluent, comprehension intact, naming intact  fund of knowledge appropriate  CRANIAL NERVE:   2nd - no papilledema on fundoscopic exam  2nd, 3rd, 4th, 6th - pupils 73mm equal and reactive to light, visual fields full to confrontation, extraocular muscles intact, no nystagmus  5th - facial sensation symmetric  7th - facial strength symmetric  8th - hearing intact  9th - palate elevates symmetrically, uvula midline  11th - shoulder shrug symmetric  12th - tongue protrusion midline  MOTOR:   normal bulk and tone, full strength in the BUE, BLE  SENSORY:   normal and symmetric to light touch, temperature, vibration  COORDINATION:   finger-nose-finger, fine finger movements normal  REFLEXES:   deep tendon reflexes present and symmetric  GAIT/STATION:   narrow based gait; ROMBERG NEGATIVE     DIAGNOSTIC DATA (LABS, IMAGING, TESTING) - I reviewed patient records, labs, notes, testing and imaging myself where available.  Lab Results  Component Value Date   WBC 5.5 02/27/2018   HGB 12.9 02/27/2018   HCT 38.1 02/27/2018   MCV 81 02/27/2018   PLT 272 02/27/2018      Component Value Date/Time    NA 138 06/13/2018 1523   K 4.4 06/13/2018 1523  CL 100 06/13/2018 1523   CO2 25 06/13/2018 1523   GLUCOSE 84 06/13/2018 1523   GLUCOSE 155 (H) 01/27/2018 0700   BUN 21 06/13/2018 1523   CREATININE 1.24 (H) 06/13/2018 1523   CREATININE 1.03 (H) 11/19/2014 1131   CALCIUM 9.6 06/13/2018 1523   PROT 6.9 02/27/2018 1323   ALBUMIN 4.0 02/27/2018 1323   AST 15 02/27/2018 1323   ALT 18 02/27/2018 1323   ALKPHOS 69 02/27/2018 1323   BILITOT 0.3 02/27/2018 1323   GFRNONAA 44 (L) 06/13/2018 1523   GFRNONAA 57 (L) 11/19/2014 1131   GFRAA 51 (L) 06/13/2018 1523   GFRAA 65 11/19/2014 1131   Lab Results  Component Value Date   CHOL 161 11/19/2014   HDL 46 11/19/2014   LDLCALC 96 11/19/2014   LDLDIRECT 182.8 04/27/2010   TRIG 93 11/19/2014   CHOLHDL 3.5 11/19/2014   Lab Results  Component Value Date   HGBA1C 6.2 (H) 01/23/2018   Lab Results  Component Value Date   [id number] 01/23/2018   Lab Results  Component Value Date   TSH 0.98 04/27/2010   Lab Results  Component Value Date   ANA Negative 01/23/2018   01/23/18 HIV Screen 4th Generation wRfx Non Reactive  01/22/18 MRI brain and orbits [I reviewed images myself and agree with interpretation. -VRP]  1. Abnormal enhancement involving the right optic nerve as above, suggesting acute optic neuritis. 2. No other acute intracranial abnormality. 3. Mild chronic small vessel ischemic disease for age. 4. Multiple scattered chronic micro hemorrhages involving the right thalamus and pons, most likely related to chronic underlying Hypertension.  02/16/18 MRI of the cervical spine with and without contrast shows the following: [I reviewed images myself and agree with interpretation. -VRP]  1.    There is a small nonenhancing T2 hyperintense focus within the spinal cord adjacent to the C4 vertebral body/C4-C5 interspace.  On the axial views, the increased signal appears to be in the anterior horns.  This pattern would be more  consistent with sequela of a compressive myelopathy though demyelination cannot be completely ruled out. 2.    Multilevel degenerative changes causing mild spinal stenosis at C3-C4, C4-C5, C5-C6 and C6-C7.  At C4-C5 there is potential for right C5 nerve root compression. 3.    There is a normal enhancement pattern.  02/16/18 MRI of the thoracic spine with and without contrast shows the following: [I reviewed images myself and agree with interpretation. -VRP]  1.    The spinal cord appears normal. 2.    Mild multilevel degenerative changes as detailed above that do not lead to nerve root compression or spinal stenosis. 3.    No acute findings and a normal enhancement pattern.   01/30/18  NMO IgG Autoantibodies 0.0 - 3.0 U/mL 260.6High        ASSESSMENT AND PLAN  68 y.o. year old female here with right optic neuritis (right eye; retrobulbar) and positive NMO antibodies.  Dx:  1. Devic's disease ([place])      PLAN:  Neuromyelitis optica (+ NMO ab; + optic neuritis; + ? spinal cord lesion) - continue rituxan every 6 months - check CBC, CMP every 6 months (rituxan monitoring)  Orders Placed This Encounter  Procedures  . Comprehensive metabolic panel  . CBC with Differential/Platelet   Return in about 6 months (around 05/18/2019).    Penni Bombard, MD [id number], [id number] PM Certified in Neurology, Neurophysiology and Neuroimaging  [facility] [address], [place],  [place] [address] [phone]

## 2018-11-19 LAB — COMPREHENSIVE METABOLIC PANEL
ALT: 20 IU/L (ref 0–32)
AST: 19 IU/L (ref 0–40)
Albumin/Globulin Ratio: 1.5 (ref 1.2–2.2)
Albumin: 4.2 g/dL (ref 3.8–4.8)
Alkaline Phosphatase: 76 IU/L (ref 39–117)
BUN/Creatinine Ratio: 13 (ref 12–28)
BUN: 16 mg/dL (ref 8–27)
Bilirubin Total: 0.3 mg/dL (ref 0.0–1.2)
CO2: 20 mmol/L (ref 20–29)
Calcium: 9.4 mg/dL (ref 8.7–10.3)
Chloride: 104 mmol/L (ref 96–106)
Creatinine, Ser: 1.25 mg/dL — ABNORMAL HIGH (ref 0.57–1.00)
GFR calc Af Amer: 50 mL/min/{1.73_m2} — ABNORMAL LOW (ref >59)
GFR calc non Af Amer: 44 mL/min/{1.73_m2} — ABNORMAL LOW (ref >59)
Globulin, Total: 2.8 g/dL (ref 1.5–4.5)
Glucose: 84 mg/dL (ref 65–99)
Potassium: 4.3 mmol/L (ref 3.5–5.2)
Sodium: 142 mmol/L (ref 134–144)
Total Protein: 7 g/dL (ref 6.0–8.5)

## 2018-11-19 LAB — CBC WITH DIFFERENTIAL/PLATELET
Basophils Absolute: 0 10*3/uL (ref 0.0–0.2)
Basos: 1 %
EOS (ABSOLUTE): 0 10*3/uL (ref 0.0–0.4)
Eos: 1 %
Hematocrit: 40.4 % (ref 34.0–46.6)
Hemoglobin: 13.5 g/dL (ref 11.1–15.9)
Immature Grans (Abs): 0 10*3/uL (ref 0.0–0.1)
Immature Granulocytes: 0 %
Lymphocytes Absolute: 1.4 10*3/uL (ref 0.7–3.1)
Lymphs: 35 %
MCH: 27.1 pg (ref 26.6–33.0)
MCHC: 33.4 g/dL (ref 31.5–35.7)
MCV: 81 fL (ref 79–97)
Monocytes Absolute: 0.5 10*3/uL (ref 0.1–0.9)
Monocytes: 12 %
Neutrophils Absolute: 2 10*3/uL (ref 1.4–7.0)
Neutrophils: 51 %
Platelets: 259 10*3/uL (ref 150–450)
RBC: 4.98 x10E6/uL (ref 3.77–5.28)
RDW: 13.6 % (ref 11.7–15.4)
WBC: 4 10*3/uL (ref 3.4–10.8)

## 2018-11-20 ENCOUNTER — Telehealth: Payer: Self-pay | Admitting: *Deleted

## 2018-11-20 NOTE — Telephone Encounter (Signed)
Called patient and informed her labs are unremarkable. Patient verbalized understanding, appreciation.

## 2018-11-25 ENCOUNTER — Telehealth (HOSPITAL_COMMUNITY): Payer: Self-pay | Admitting: Emergency Medicine

## 2018-11-25 NOTE — Telephone Encounter (Signed)
Reaching out to patient to offer assistance regarding upcoming cardiac imaging study; pt verbalizes understanding of appt date/time, parking situation and where to check in, pre-test NPO status and medications ordered, and verified current allergies; name and call back number provided for further questions should they arise Darby Shadwick RN Navigator Cardiac Imaging [facility] [phone] office [phone] cell 

## 2018-11-27 ENCOUNTER — Other Ambulatory Visit: Payer: Self-pay

## 2018-11-27 ENCOUNTER — Inpatient Hospital Stay (HOSPITAL_COMMUNITY)
Admission: RE | Admit: 2018-11-27 | Discharge: 2018-11-27 | Disposition: A | Discharge status: Home / Self Care | Payer: Medicare HMO | Source: Ambulatory Visit | Attending: Cardiology | Admitting: Cardiology

## 2018-11-27 ENCOUNTER — Ambulatory Visit (HOSPITAL_COMMUNITY)
Admission: RE | Admit: 2018-11-27 | Discharge: 2018-11-27 | Disposition: A | Discharge status: Home / Self Care | Payer: Medicare HMO | Source: Ambulatory Visit | Attending: Cardiology | Admitting: Cardiology

## 2018-11-27 DIAGNOSIS — R0789 Other chest pain: Secondary | ICD-10-CM | POA: Diagnosis not present

## 2018-11-27 DIAGNOSIS — I700 Atherosclerosis of aorta: Secondary | ICD-10-CM | POA: Insufficient documentation

## 2018-11-27 MED ORDER — IOHEXOL 350 MG/ML SOLN
100.0000 mL | Freq: Once | INTRAVENOUS | Status: AC | PRN
  Administered 2018-11-27: 100 mL via INTRAVENOUS

## 2018-11-27 MED ORDER — NITROGLYCERIN 0.4 MG SL SUBL
0.8000 mg | SUBLINGUAL_TABLET | Freq: Once | SUBLINGUAL | Status: AC
  Administered 2018-11-27: 15:00:00 0.8 mg via SUBLINGUAL

## 2018-11-27 MED ORDER — NITROGLYCERIN 0.4 MG SL SUBL
SUBLINGUAL_TABLET | SUBLINGUAL | Status: AC
  Administered 2018-11-27: 0.8 mg via SUBLINGUAL
  Filled 2018-11-27: qty 2

## 2018-12-03 ENCOUNTER — Other Ambulatory Visit: Payer: Self-pay | Admitting: Internal Medicine

## 2018-12-03 DIAGNOSIS — K219 Gastro-esophageal reflux disease without esophagitis: Secondary | ICD-10-CM

## 2018-12-10 DIAGNOSIS — H469 Unspecified optic neuritis: Secondary | ICD-10-CM | POA: Diagnosis not present

## 2018-12-11 ENCOUNTER — Ambulatory Visit (INDEPENDENT_AMBULATORY_CARE_PROVIDER_SITE_OTHER): Payer: Medicare HMO | Admitting: Internal Medicine

## 2018-12-11 ENCOUNTER — Encounter: Payer: Self-pay | Admitting: Internal Medicine

## 2018-12-11 ENCOUNTER — Other Ambulatory Visit: Payer: Self-pay

## 2018-12-11 DIAGNOSIS — G360 Neuromyelitis optica [Devic]: Secondary | ICD-10-CM

## 2018-12-11 DIAGNOSIS — R0789 Other chest pain: Secondary | ICD-10-CM

## 2018-12-11 DIAGNOSIS — I10 Essential (primary) hypertension: Secondary | ICD-10-CM

## 2018-12-11 DIAGNOSIS — Z79899 Other long term (current) drug therapy: Secondary | ICD-10-CM | POA: Diagnosis not present

## 2018-12-11 HISTORY — DX: Other chest pain: R07.89

## 2018-12-11 NOTE — Progress Notes (Signed)
   CC: Follow-up hypertension  HPI:  [name] is a 68 y.o. with medical history listed below presenting to follow-up on chronic medical problems.  Please see problem based charting for further details.   Past Medical History:  Diagnosis Date  . Abdominal pain 05/22/2014  . Allergic rhinitis 04/16/2015  . Anxiety state, unspecified   . ARTHRALGIA UNSPECIFIED SITE 12/04/2008   Qualifier: Diagnosis of  By: Melvyn Novas MD, Christena Deem   . Arthritis   . Atherosclerosis of abdominal aorta ([place]) 10/01/2015   Aortoiliac atherosclerosis noted on CT abdomen in 2016.  Marland Kitchen Cough with congestion of paranasal sinus 06/01/2015  . Dizziness 04/06/2012  . Essential hypertension 12/24/2006  . Estrogen deficiency 11/16/2016  . GERD (gastroesophageal reflux disease) 08/07/2018  . H/O: hysterectomy   . Hx-TIA (transient ischemic attack) 04/06/2012  . Hypertension   . Hypokalemia 02/12/2018  . Influenza-like illness 02/02/2016  . Loss of part of visual field   . Morbid obesity ([place])   . Myopia   . Nausea with vomiting 05/23/2014  . 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 NSVT (nonsustained ventricular tachycardia) ([place])    a. 01/2005 Echo: EF 55-65%  . Osteopenia   . Other and unspecified hyperlipidemia   . Palpitations   . Plantar fasciitis, bilateral 06/12/2016  . Rash 03/23/2017  . Right ankle injury, initial encounter 06/12/2016  . Right elbow pain 06/11/2017  . Right leg pain 09/06/2016  . Right optic neuritis 01/22/2018  . Sciatica 01/15/2018  . Shoulder pain, bilateral (Concern for CAD) 08/28/2018  . Umbilical hernia   . Unspecified essential hypertension   . Vaginal candidiasis 02/12/2018  . VENTRICULAR TACHYCARDIA 12/04/2008   F/u  [facility] Hochrein    . Viral upper respiratory tract infection 01/15/2018  . Vitamin D deficiency 02/12/2018   Review of Systems: As per HPI  Physical Exam:  Vitals:   12/11/18 1403  BP: 135/79  Pulse: 80  Temp: 98.4 F (36.9 C)  TempSrc: Oral  SpO2: 100%  Weight: 207  lb 3.2 oz (94 kg)   Physical Exam  Constitutional: She is well-developed, well-nourished, and in no distress. No distress.  HENT:  Head: Normocephalic and atraumatic.  Eyes: Right eye visual fields normal and left eye visual fields normal. Pupils are equal, round, and reactive to light. Conjunctivae, EOM and lids are normal. Right eye exhibits no discharge. Left eye exhibits no discharge.  Cardiovascular: Normal rate, regular rhythm and normal heart sounds.  Musculoskeletal: Normal range of motion.        General: No deformity or edema.  Skin: She is not diaphoretic.    Assessment & Plan:   See Encounters Tab for problem based charting.  Patient discussed with Dr. Heber

## 2018-12-11 NOTE — Assessment & Plan Note (Signed)
Hypertension: BP 135/79, pulse 81  Plan: -Continue Coreg 25 mg twice daily -Continue verapamil 60 mg 3 times a day -Continue Lasix 40 mg daily -Continue Aldactone 25 mg daily

## 2018-12-11 NOTE — Assessment & Plan Note (Signed)
Neuromyelitis optica: Denies vision abnormalities.  She follows up with neurology and gets IV rituximab infusion every 6 months.  Her last infusion was yesterday.  This morning, she reported of an episode of diarrhea with palpitation.  She states that recently she has had trouble sleeping and usually goes to bed at 5 AM however she woke up soon after laying down and started feeling hot with subsequent diarrhea.  Currently, she denies dizziness, lightheadedness or presyncope.  The side effect profile of rituximab includes flushing (5 to 14%),  Abdominal pain (14%), diarrhea (10% to 17%), nausea (8% to 23%)  I have advised her to keep up with oral hydration and monitor her symptoms clinically.  Plan: -Follow-up with neurology -Rituxan infusion every 6 months

## 2018-12-11 NOTE — Patient Instructions (Signed)
Ms. Lichter,   It was a pleasure taking care of you at the clinic today.  I believe the diarrhea you have this morning's most likely a side effect of the Rituxan infusion yesterday.  There is about 10 to 17% of patients who reported of diarrhea with Rituxan.  I am glad that you are currently not having dizziness or lightheadedness.  Please keep up with your oral intake and drink plenty of fluids.  Take care!  Dr. Eileen Stanford  Please call the internal medicine center clinic if you have any questions or concerns, we may be able to help and keep you from a long and expensive emergency room wait. Our clinic and after hours phone number is [phone], the best time to call is Monday through Friday 9 am to 4 pm but there is always someone available 24/7 if you have an emergency. If you need medication refills please notify your pharmacy one week in advance and they will send [name] a request.

## 2018-12-11 NOTE — Assessment & Plan Note (Signed)
Atypical Chest pain: During my evaluation of Michelle Hurley in August, she reported of a single episode of bilateral shoulder and neck tightness with associated nausea and diaphoresis.  I was concerned about possible CAD and had her follow-up with cardiology. she underwent coronary CT which was unremarkable.    Presently, she denies chest pain, shortness of breath or any ongoing cardiopulmonary symptoms.  Plan: -Continue follow-up with cardiology

## 2018-12-16 NOTE — Progress Notes (Signed)
Internal Medicine Clinic Attending  Case discussed with Dr. Agyei at the time of the visit.  We reviewed the resident's history and exam and pertinent patient test results.  I agree with the assessment, diagnosis, and plan of care documented in the resident's note.    

## 2018-12-24 DIAGNOSIS — H469 Unspecified optic neuritis: Secondary | ICD-10-CM | POA: Diagnosis not present

## 2019-01-08 ENCOUNTER — Other Ambulatory Visit: Payer: Self-pay | Admitting: Internal Medicine

## 2019-01-08 DIAGNOSIS — E7800 Pure hypercholesterolemia, unspecified: Secondary | ICD-10-CM

## 2019-01-24 ENCOUNTER — Telehealth: Payer: Self-pay | Admitting: Internal Medicine

## 2019-01-24 NOTE — Telephone Encounter (Signed)
Pt is wanting to know if she needs to take the [place] vaccine [phone]

## 2019-01-24 NOTE — Telephone Encounter (Signed)
Returned call to patient. States she was told by PCP that he did not want her to take the Covid vaccine 2/2 compromised immune system and infusion she is taking. Also, states she continues to get bumps on her skin that are very itchy. She thought it may be due to omeprazole so she stopped it a few days ago but has not noticed improvement. She asked for first available appt with PCP. She is scheduled for 01/29/2019 but is also requesting a call back from PCP. Hubbard Hartshorn, BSN, RN-BC

## 2019-01-29 ENCOUNTER — Ambulatory Visit (INDEPENDENT_AMBULATORY_CARE_PROVIDER_SITE_OTHER): Payer: Medicare HMO | Admitting: Internal Medicine

## 2019-01-29 ENCOUNTER — Encounter: Payer: Self-pay | Admitting: Internal Medicine

## 2019-01-29 VITALS — BP 141/73 | HR 81 | Temp 98.4°F | Wt 206.4 lb

## 2019-01-29 DIAGNOSIS — Z79899 Other long term (current) drug therapy: Secondary | ICD-10-CM

## 2019-01-29 DIAGNOSIS — Z0000 Encounter for general adult medical examination without abnormal findings: Secondary | ICD-10-CM

## 2019-01-29 DIAGNOSIS — L299 Pruritus, unspecified: Secondary | ICD-10-CM

## 2019-01-29 DIAGNOSIS — L282 Other prurigo: Secondary | ICD-10-CM

## 2019-01-29 DIAGNOSIS — I10 Essential (primary) hypertension: Secondary | ICD-10-CM

## 2019-01-29 MED ORDER — HYDROXYZINE HCL 10 MG PO TABS: 10.0000 mg | ORAL_TABLET | Freq: Three times a day (TID) | ORAL | 0 refills | Status: DC | PRN

## 2019-01-29 NOTE — Assessment & Plan Note (Signed)
Health maintenance: -Due for flu vaccine and mammogram

## 2019-01-29 NOTE — Patient Instructions (Signed)
Michelle Hurley,   It was a pleasure seeing you here today.   As we discussed today.  I want you to stop taking the vitamin B12, vitamin C, vitamin D3.  I also think you would benefit from using a moisturizing lotion.  Take care! Dr. Eileen Stanford  Please call the internal medicine center clinic if you have any questions or concerns, we may be able to help and keep you from a long and expensive emergency room wait. Our clinic and after hours phone number is [phone], the best time to call is Monday through Friday 9 am to 4 pm but there is always someone available 24/7 if you have an emergency. If you need medication refills please notify your pharmacy one week in advance and they will send [name] a request.

## 2019-01-29 NOTE — Assessment & Plan Note (Signed)
Hypertension: Chronic  BP Readings from Last 3 Encounters:  01/29/19 (!) 141/73  12/11/18 135/79  11/27/18 117/76    Plan: -Continue Coreg 25 mg daily -Continue Lasix 40 mg daily -Continue spironolactone 25 mg daily -Continue verapamil 60 mg 3 times daily

## 2019-01-29 NOTE — Progress Notes (Signed)
   CC: Follow-up hypertension, pruritus  HPI:  [name] is a 69 y.o. very pleasant African-American woman with medical history listed below presented to follow-up on chronic medical problems.  Also with complaints of a mild pruritic rash  Please see problem based charting for further details.  Past Medical History:  Diagnosis Date  . Abdominal pain 05/22/2014  . Allergic rhinitis 04/16/2015  . Anxiety state, unspecified   . ARTHRALGIA UNSPECIFIED SITE 12/04/2008   Qualifier: Diagnosis of  By: Melvyn Novas MD, Christena Deem   . Arthritis   . Atherosclerosis of abdominal aorta ([place]) 10/01/2015   Aortoiliac atherosclerosis noted on CT abdomen in 2016.  Marland Kitchen Cough with congestion of paranasal sinus 06/01/2015  . Dizziness 04/06/2012  . Essential hypertension 12/24/2006  . Estrogen deficiency 11/16/2016  . GERD (gastroesophageal reflux disease) 08/07/2018  . H/O: hysterectomy   . Hx-TIA (transient ischemic attack) 04/06/2012  . Hypertension   . Hypokalemia 02/12/2018  . Influenza-like illness 02/02/2016  . Loss of part of visual field   . Morbid obesity ([place])   . Myopia   . Nausea with vomiting 05/23/2014  . 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 NSVT (nonsustained ventricular tachycardia) ([place])    a. 01/2005 Echo: EF 55-65%  . Osteopenia   . Other and unspecified hyperlipidemia   . Palpitations   . Plantar fasciitis, bilateral 06/12/2016  . Rash 03/23/2017  . Right ankle injury, initial encounter 06/12/2016  . Right elbow pain 06/11/2017  . Right leg pain 09/06/2016  . Right optic neuritis 01/22/2018  . Sciatica 01/15/2018  . Shoulder pain, bilateral (Concern for CAD) 08/28/2018  . Umbilical hernia   . Unspecified essential hypertension   . Vaginal candidiasis 02/12/2018  . VENTRICULAR TACHYCARDIA 12/04/2008   F/u  [facility] Hochrein    . Viral upper respiratory tract infection 01/15/2018  . Vitamin D deficiency 02/12/2018   Review of Systems:  As per HPI  Physical Exam:  Vitals:   01/29/19 1452    BP: (!) 141/73  Pulse: 81  Temp: 98.4 F (36.9 C)  TempSrc: Oral  SpO2: 99%  Weight: 206 lb 6.4 oz (93.6 kg)   Physical Exam  Constitutional: She is well-developed, well-nourished, and in no distress.  HENT:  Head: Normocephalic and atraumatic.  Cardiovascular: Normal rate, regular rhythm and normal heart sounds.  Pulmonary/Chest: Breath sounds normal. No respiratory distress. She has no wheezes.  Skin: No purpura noted. Rash is not macular, not papular, not maculopapular, not nodular, not pustular, not vesicular and not urticarial.        Assessment & Plan:   See Encounters Tab for problem based charting.  Patient discussed with Dr. Dareen Piano

## 2019-01-29 NOTE — Assessment & Plan Note (Signed)
Pruritus: She states that she has had a pruritic rash for a while now though timeline is difficult to elicit.  She somewhat tells me that during her last hospitalization in January 2020, she began experiencing itching after receiving steroids though symptoms improved and subsided.  During the hospitalization, she was given "vitamin supplements "and at discharge she bought over-the-counter vitamin supplements including vitamin B12, vitamin C and vitamin D3.  Her symptoms then reoccurred however recently they got worse after I had started her on omeprazole for severe GERD.  She denies change in her soap, laundry detergent or new contact exposures.  On physical exams, she has multiple upper and lower extremity mild rash and several scratch marks better well-healing.      Assessment: Is challenging to delineate the inciting cause of her pruritic rash.  Could be secondary to over-the-counter vitamin supplements or omeprazole.  Plan: -Advised to discontinue vitamin B12, vitamin C, vitamin D3 and omeprazole. -Given empiric hydroxyzine prn for symptomatic relief

## 2019-01-30 ENCOUNTER — Telehealth: Payer: Self-pay | Admitting: Internal Medicine

## 2019-01-30 NOTE — Telephone Encounter (Signed)
Michelle Hurley, pt wants to talk to you about vaccine

## 2019-01-30 NOTE — Telephone Encounter (Signed)
Pt is requesting callback from nurse regarding the vaccine; [phone]

## 2019-01-30 NOTE — Progress Notes (Signed)
Internal Medicine Clinic Attending  Case discussed with Dr. Agyei at the time of the visit.  We reviewed the resident's history and exam and pertinent patient test results.  I agree with the assessment, diagnosis, and plan of care documented in the resident's note.    

## 2019-01-31 NOTE — Telephone Encounter (Signed)
Called patient since it landed in my in-box. Not a patient I see in the anticoagulation management clinic. She indicates she needed to hear from Dr. Eileen Stanford regarding with her immune status whether she should, or should not take the COVID19 vaccine. She states she was told early in the vaccination availability, to defer. She wants to know whether with passage of time since initial rollout, if she should now take the vaccine.

## 2019-01-31 NOTE — Telephone Encounter (Signed)
Thanks for the update. I'll give her a call

## 2019-02-21 ENCOUNTER — Telehealth: Payer: Self-pay | Admitting: Internal Medicine

## 2019-02-21 NOTE — Telephone Encounter (Signed)
RTC to patient, pt states she is on infusion therapy and is having some type of allergic reaction since starting infusion therapy.  Pt states she has a call into her Infusion MD regarding the vaccine, but has been seeing Dr. Eileen Stanford for her allergies and is requesting a callback from Dr. Eileen Stanford.   Will forward to PCP. SChaplin, RN,BSN

## 2019-02-21 NOTE — Telephone Encounter (Signed)
Pt wants to know if she can get the covid vaccine [phone]

## 2019-02-26 ENCOUNTER — Telehealth: Payer: Self-pay | Admitting: *Deleted

## 2019-02-26 NOTE — Telephone Encounter (Signed)
Called patient and informed her that from neurological standpoint, Dr Leta Baptist stated she can take covid vaccine. He advises she contact PCP as well which she has done. She should continue to follow CDC guidelines after receiving doses of vaccine. She stated [facility] and they have enough vaccines to give to residents. She stated she will sign up for it,  verbalized understanding, appreciation.

## 2019-03-05 NOTE — Telephone Encounter (Signed)
Called patient

## 2019-03-14 ENCOUNTER — Telehealth: Payer: Self-pay | Admitting: Internal Medicine

## 2019-03-14 NOTE — Telephone Encounter (Signed)
Pls contact pharmacy [phone] 

## 2019-03-14 NOTE — Telephone Encounter (Signed)
Thanks

## 2019-03-14 NOTE — Telephone Encounter (Signed)
Contacted number below and spoke with Sailor who states she is with [facility].  She is requesting VO for ASA and Lovastatin.  RN did not give VO and requested all RX refill request be sent to [facility] either by fax or [place]. SChaplin, RN,BSN

## 2019-03-17 ENCOUNTER — Telehealth: Payer: Self-pay | Admitting: Internal Medicine

## 2019-03-17 NOTE — Telephone Encounter (Signed)
Received TC from patient.  She states she is at her [facility] and is getting the [place] today.  Pt has already spoken to both her PCP and her Neurologist about getting the vaccine and states both MD's suggested she receive the Covid vaccine. SChaplin, RN,BSN

## 2019-03-18 ENCOUNTER — Other Ambulatory Visit: Payer: Self-pay | Admitting: *Deleted

## 2019-03-18 DIAGNOSIS — I10 Essential (primary) hypertension: Secondary | ICD-10-CM

## 2019-03-18 DIAGNOSIS — E7800 Pure hypercholesterolemia, unspecified: Secondary | ICD-10-CM

## 2019-03-18 MED ORDER — ASPIRIN EC 81 MG PO TBEC: 81.0000 mg | DELAYED_RELEASE_TABLET | Freq: Every day | ORAL | 1 refills | Status: AC

## 2019-03-18 MED ORDER — LOVASTATIN 20 MG PO TABS: 20.0000 mg | ORAL_TABLET | Freq: Every day | ORAL | 1 refills | Status: DC

## 2019-03-31 ENCOUNTER — Ambulatory Visit (INDEPENDENT_AMBULATORY_CARE_PROVIDER_SITE_OTHER): Payer: Medicare HMO | Admitting: Internal Medicine

## 2019-03-31 ENCOUNTER — Other Ambulatory Visit: Payer: Self-pay

## 2019-03-31 ENCOUNTER — Encounter: Payer: Self-pay | Admitting: Internal Medicine

## 2019-03-31 VITALS — BP 132/75 | HR 76 | Temp 98.5°F | Wt 206.3 lb

## 2019-03-31 DIAGNOSIS — L282 Other prurigo: Secondary | ICD-10-CM | POA: Diagnosis not present

## 2019-03-31 DIAGNOSIS — I10 Essential (primary) hypertension: Secondary | ICD-10-CM | POA: Diagnosis not present

## 2019-03-31 DIAGNOSIS — Z0000 Encounter for general adult medical examination without abnormal findings: Secondary | ICD-10-CM

## 2019-03-31 DIAGNOSIS — Z79899 Other long term (current) drug therapy: Secondary | ICD-10-CM

## 2019-03-31 NOTE — Assessment & Plan Note (Signed)
Pruritic rash: I evaluated Michelle Hurley in January and she complained of a pruritic rash that is diffused on her upper and lower extremities.  During my visit, we did a thorough med review and she had not been started on any new medications with exception of vitamin supplements which were discontinued.  I started her on hydroxyzine as needed for itching however she states that it made her sleepy and discontinued medication.  On further questioning, she makes me aware that she had a history of eczema as a child which occurred in the antecubital regions and the popliteal fossa which has not recurred.  She states that her daughter also suffers from eczema her itching is still present though she uses argon oil in her lotion to keep her skin moisturized.  On physical exams, she has several raised pruritic rashes at the upper extremities as well as the dorsal aspect of her fingers bilaterally.   Given her prior history of eczema as well as allergies for which she takes Claritin, I am wondering if her pruritic rash could be atypical presentation of eczema or allergies.  We discussed either dermatology or allergy referral and she opted for dermatology.  Plan: -Advised to continue moisturizing lotion -Referral to dermatology has been placed

## 2019-03-31 NOTE — Progress Notes (Signed)
Internal Medicine Clinic Attending  Case discussed with Dr. Agyei at the time of the visit.  We reviewed the resident's history and exam and pertinent patient test results.  I agree with the assessment, diagnosis, and plan of care documented in the resident's note.    

## 2019-03-31 NOTE — Progress Notes (Signed)
   CC: Follow-up hypertension, pruritic rash  HPI:  Michelle Hurley is a 69 y.o. very pleasant African-American woman with medical history listed below presenting to follow-up on chronic medical problems as well as pruritic rash.  Please see problem based charting for further details.  Past Medical History:  Diagnosis Date  . Abdominal pain 05/22/2014  . Allergic rhinitis 04/16/2015  . Anxiety state, unspecified   . ARTHRALGIA UNSPECIFIED SITE 12/04/2008   Qualifier: Diagnosis of  By: Melvyn Novas MD, Christena Deem   . Arthritis   . Atherosclerosis of abdominal aorta ([place]) 10/01/2015   Aortoiliac atherosclerosis noted on CT abdomen in 2016.  Marland Kitchen Cough with congestion of paranasal sinus 06/01/2015  . Dizziness 04/06/2012  . Essential hypertension 12/24/2006  . Estrogen deficiency 11/16/2016  . GERD (gastroesophageal reflux disease) 08/07/2018  . H/O: hysterectomy   . Hx-TIA (transient ischemic attack) 04/06/2012  . Hypertension   . Hypokalemia 02/12/2018  . Influenza-like illness 02/02/2016  . Loss of part of visual field   . Morbid obesity ([place])   . Myopia   . Nausea with vomiting 05/23/2014  . 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 NSVT (nonsustained ventricular tachycardia) ([place])    a. 01/2005 Echo: EF 55-65%  . Osteopenia   . Other and unspecified hyperlipidemia   . Palpitations   . Plantar fasciitis, bilateral 06/12/2016  . Rash 03/23/2017  . Right ankle injury, initial encounter 06/12/2016  . Right elbow pain 06/11/2017  . Right leg pain 09/06/2016  . Right optic neuritis 01/22/2018  . Sciatica 01/15/2018  . Shoulder pain, bilateral (Concern for CAD) 08/28/2018  . Umbilical hernia   . Unspecified essential hypertension   . Vaginal candidiasis 02/12/2018  . VENTRICULAR TACHYCARDIA 12/04/2008   F/u  [facility] Hochrein    . Viral upper respiratory tract infection 01/15/2018  . Vitamin D deficiency 02/12/2018   Review of Systems:  As per HPI  Physical Exam:  Vitals:   03/31/19 1346  BP: 132/75    Pulse: 76  Temp: 98.5 F (36.9 C)  TempSrc: Oral  SpO2: 100%  Weight: 206 lb 4.8 oz (93.6 kg)   Physical Exam  Constitutional: She is well-developed, well-nourished, and in no distress.  Cardiovascular: Normal rate, regular rhythm and normal heart sounds.  Pulmonary/Chest: Effort normal and breath sounds normal. She has no wheezes. She has no rales.  Skin: Skin is dry and intact. Rash noted. Rash is nodular. Rash is not papular, not maculopapular, not vesicular and not urticarial. No pallor.  Parts of the raised lesions on her arms are excoriated and well healing due to scratching from itching.    Assessment & Plan:   See Encounters Tab for problem based charting.  Patient discussed with Dr. Lynnae January

## 2019-03-31 NOTE — Assessment & Plan Note (Signed)
Hypertension: At goal  BP Readings from Last 3 Encounters:  03/31/19 132/75  01/29/19 (!) 141/73  12/11/18 135/79   Plan: -Continue Coreg 25 mg daily -Continue Lasix 40 mg daily -Continue spironolactone 25 mg daily -Continue verapamil 60 mg 3 times daily

## 2019-03-31 NOTE — Assessment & Plan Note (Signed)
-  Mammogram today -We will give flu vaccine after her second shot of the coronavirus vaccination.

## 2019-03-31 NOTE — Patient Instructions (Signed)
Michelle Hurley,   Thanks for seeing me today.  I am sorry to hear that you are still having the rash.  As we discussed, I will refer you to the dermatologist but for the meantime you can try moisturizing lotions like Eucerin.  For soaps you can try Dove soaps and nonfragrance soaps.  Take care! Dr. Eileen Stanford  Please call the internal medicine center clinic if you have any questions or concerns, we may be able to help and keep you from a long and expensive emergency room wait. Our clinic and after hours phone number is [phone], the best time to call is Monday through Friday 9 am to 4 pm but there is always someone available 24/7 if you have an emergency. If you need medication refills please notify your pharmacy one week in advance and they will send [name] a request.

## 2019-04-04 ENCOUNTER — Ambulatory Visit
Admission: RE | Admit: 2019-04-04 | Discharge: 2019-04-04 | Disposition: A | Discharge status: Home / Self Care | Payer: Medicare HMO | Source: Ambulatory Visit | Attending: Internal Medicine | Admitting: Internal Medicine

## 2019-04-04 ENCOUNTER — Other Ambulatory Visit: Payer: Self-pay

## 2019-04-04 DIAGNOSIS — Z1231 Encounter for screening mammogram for malignant neoplasm of breast: Secondary | ICD-10-CM | POA: Diagnosis not present

## 2019-04-04 DIAGNOSIS — Z0000 Encounter for general adult medical examination without abnormal findings: Secondary | ICD-10-CM

## 2019-04-08 ENCOUNTER — Other Ambulatory Visit: Payer: Self-pay | Admitting: Internal Medicine

## 2019-04-08 DIAGNOSIS — R928 Other abnormal and inconclusive findings on diagnostic imaging of breast: Secondary | ICD-10-CM

## 2019-04-09 ENCOUNTER — Ambulatory Visit
Admission: RE | Admit: 2019-04-09 | Discharge: 2019-04-09 | Disposition: A | Discharge status: Home / Self Care | Payer: Medicare HMO | Source: Ambulatory Visit | Attending: Internal Medicine | Admitting: Internal Medicine

## 2019-04-09 ENCOUNTER — Other Ambulatory Visit: Payer: Self-pay

## 2019-04-09 ENCOUNTER — Other Ambulatory Visit: Payer: Self-pay | Admitting: Internal Medicine

## 2019-04-09 DIAGNOSIS — R922 Inconclusive mammogram: Secondary | ICD-10-CM | POA: Diagnosis not present

## 2019-04-09 DIAGNOSIS — R928 Other abnormal and inconclusive findings on diagnostic imaging of breast: Secondary | ICD-10-CM

## 2019-04-09 DIAGNOSIS — N6012 Diffuse cystic mastopathy of left breast: Secondary | ICD-10-CM | POA: Diagnosis not present

## 2019-04-11 ENCOUNTER — Other Ambulatory Visit: Payer: Self-pay | Admitting: Internal Medicine

## 2019-04-11 ENCOUNTER — Encounter: Payer: Self-pay | Admitting: Internal Medicine

## 2019-04-11 DIAGNOSIS — N6320 Unspecified lump in the left breast, unspecified quadrant: Secondary | ICD-10-CM | POA: Insufficient documentation

## 2019-04-11 DIAGNOSIS — R928 Other abnormal and inconclusive findings on diagnostic imaging of breast: Secondary | ICD-10-CM | POA: Insufficient documentation

## 2019-04-11 NOTE — Progress Notes (Signed)
Letter sent.

## 2019-04-14 ENCOUNTER — Ambulatory Visit
Admission: RE | Admit: 2019-04-14 | Discharge: 2019-04-14 | Disposition: A | Discharge status: Home / Self Care | Payer: Medicare HMO | Source: Ambulatory Visit | Attending: Internal Medicine | Admitting: Internal Medicine

## 2019-04-14 ENCOUNTER — Other Ambulatory Visit: Payer: Self-pay

## 2019-04-14 DIAGNOSIS — N6012 Diffuse cystic mastopathy of left breast: Secondary | ICD-10-CM | POA: Diagnosis not present

## 2019-04-14 DIAGNOSIS — N6489 Other specified disorders of breast: Secondary | ICD-10-CM | POA: Diagnosis not present

## 2019-04-14 DIAGNOSIS — R928 Other abnormal and inconclusive findings on diagnostic imaging of breast: Secondary | ICD-10-CM

## 2019-04-14 HISTORY — PX: BREAST BIOPSY: SHX20

## 2019-05-19 ENCOUNTER — Ambulatory Visit (INDEPENDENT_AMBULATORY_CARE_PROVIDER_SITE_OTHER): Payer: Medicare HMO | Admitting: Diagnostic Neuroimaging

## 2019-05-19 ENCOUNTER — Encounter: Payer: Self-pay | Admitting: Diagnostic Neuroimaging

## 2019-05-19 ENCOUNTER — Other Ambulatory Visit: Payer: Self-pay

## 2019-05-19 VITALS — BP 136/81 | HR 71 | Temp 97.3°F | Ht 62.5 in | Wt 208.0 lb

## 2019-05-19 DIAGNOSIS — G360 Neuromyelitis optica [Devic]: Secondary | ICD-10-CM | POA: Diagnosis not present

## 2019-05-19 NOTE — Progress Notes (Signed)
[facility]  PATIENT: Michelle Hurley DOB: 1950-02-01  REFERRING CLINICIAN: ER  HISTORY FROM: patient and chart REASON FOR VISIT: follow up   HISTORICAL  CHIEF COMPLAINT:  Chief Complaint  Patient presents with  . Devic's disease    rm 7, 6 month FU "doing well, next Rituxan June 15, 30th"    HISTORY OF PRESENT ILLNESS:   UPDATE (05/19/19, VRP): Since last visit, doing well. Had [place] vaccine in March / April 2021; doing well. Last rituxan infusion in Jan 2021. Symptoms are stable. No alleviating or aggravating factors.     UPDATE (11/18/18, VRP): Since last visit, doing well. Symptoms are improved. Right eye vision almost back to normal. No alleviating or aggravating factors. Tolerating rituxan; next dose in Dec 2020.   VIDEO VISIT UPDATE (05/15/18, VRP):  - overall feels well - vision is improved - no pain - waiting on insurance approval for rituxan  PRIOR HPI (01/30/18): 69 year old female with hypertension, here for evaluation of right retrobulbar, idiopathic optic neuritis.  01/22/2018 patient presented to the hospital after seeing her ophthalmologist for new onset right eye vision loss.  She reported an altitudinal superior visual field defect with some pain.  No problems in the left eye.  Upon admission patient had MRI of the brain and orbits which showed enhancing right optic nerve lesion.  Mild chronic small vessel ischemic disease was also noted as well as scattered chronic cerebral microhemorrhages.  Patient was diagnosed with idiopathic optic neuritis.  She was treated with IV Solu-Medrol.  She had some side effects of hallucinations and irritability.  She completed her course and was discharged home.  Since that time symptoms are continuing to improve.  No problems with arms, legs, balance, bowel or bladder dysfunction.   REVIEW OF SYSTEMS: Full 14 system review of systems performed and negative with exception of: as per HPI.    ALLERGIES: Allergies  Allergen Reactions  . Mushroom Extract Complex Anaphylaxis, Itching and Swelling    Swelling all over body   . Codeine Nausea And Vomiting  . Hydrocodone Nausea And Vomiting  . Prednisone Nausea And Vomiting  . Omeprazole Itching    HOME MEDICATIONS: Outpatient Medications Prior to Visit  Medication Sig Dispense Refill  . aspirin EC 81 MG tablet Take 1 tablet (81 mg total) by mouth daily. 90 tablet 1  . carvedilol (COREG) 25 MG tablet TAKE 1 TABLET TWICE DAILY WITH MEALS 180 tablet 1  . furosemide (LASIX) 40 MG tablet TAKE 1 TABLET (40 MG TOTAL) BY MOUTH DAILY. 90 tablet 1  . lovastatin (MEVACOR) 20 MG tablet Take 1 tablet (20 mg total) by mouth at bedtime. 90 tablet 1  . polyethylene glycol powder (GLYCOLAX/MIRALAX) powder Take 17 g by mouth daily as needed for moderate constipation or severe constipation. 255 g 0  . RITUXAN 500 MG/50ML injection 05/14/18 not started yet    . spironolactone (ALDACTONE) 25 MG tablet TAKE 1 TABLET EVERY DAY 90 tablet 1  . verapamil (CALAN) 120 MG tablet TAKE 1/2 TABLET THREE TIMES DAILY 135 tablet 3   No facility-administered medications prior to visit.    PAST MEDICAL HISTORY: Past Medical History:  Diagnosis Date  . Abdominal pain 05/22/2014  . Allergic rhinitis 04/16/2015  . Anxiety state, unspecified   . ARTHRALGIA UNSPECIFIED SITE 12/04/2008   Qualifier: Diagnosis of  By: Melvyn Novas MD, Christena Deem   . Arthritis   . Atherosclerosis of abdominal aorta ([place]) 10/01/2015   Aortoiliac atherosclerosis noted on CT  abdomen in 2016.  Marland Kitchen Cough with congestion of paranasal sinus 06/01/2015  . Dizziness 04/06/2012  . Essential hypertension 12/24/2006  . Estrogen deficiency 11/16/2016  . GERD (gastroesophageal reflux disease) 08/07/2018  . H/O: hysterectomy   . Hx-TIA (transient ischemic attack) 04/06/2012  . Hypertension   . Hypokalemia 02/12/2018  . Influenza-like illness 02/02/2016  . Loss of part of visual field   . Morbid obesity ([place])    . Myopia   . Nausea with vomiting 05/23/2014  . 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 NSVT (nonsustained ventricular tachycardia) ([place])    a. 01/2005 Echo: EF 55-65%  . Osteopenia   . Other and unspecified hyperlipidemia   . Palpitations   . Plantar fasciitis, bilateral 06/12/2016  . Rash 03/23/2017  . Right ankle injury, initial encounter 06/12/2016  . Right elbow pain 06/11/2017  . Right leg pain 09/06/2016  . Right optic neuritis 01/22/2018  . Sciatica 01/15/2018  . Shoulder pain, bilateral (Concern for CAD) 08/28/2018  . Umbilical hernia   . Unspecified essential hypertension   . Vaginal candidiasis 02/12/2018  . VENTRICULAR TACHYCARDIA 12/04/2008   F/u  [facility]hc Hochrein    . Viral upper respiratory tract infection 01/15/2018  . Vitamin D deficiency 02/12/2018    PAST SURGICAL HISTORY: Past Surgical History:  Procedure Laterality Date  . ABDOMINAL HYSTERECTOMY  1974  . ESOPHAGOGASTRODUODENOSCOPY N/A 05/28/2014   Procedure: ESOPHAGOGASTRODUODENOSCOPY (EGD);  Surgeon: Teena Irani, MD;  Location: [facility];  Service: Endoscopy;  Laterality: N/A;  . LAPAROSCOPIC SMALL BOWEL RESECTION N/A 06/04/2014   Procedure: LAPAROSCOPIC SMALL BOWEL RESECTION;  Surgeon: Fanny Skates, MD;  Location: [facility];  Service: General;  Laterality: N/A;  . LAPAROSCOPIC TRANSABDOMINAL HERNIA N/A 05/03/2014   Procedure: LAPAROSCOPIC EXPLORATION WITH OPEN REPAIR OF INCARCERATED EPIGASTRIC HERNIA ;  Surgeon: Alphonsa Overall, MD;  Location: [facility];  Service: General;  Laterality: N/A;    FAMILY HISTORY: Family History  Problem Relation Age of Onset  . Heart disease Mother   . Diabetes Mother   . Lung cancer Mother   . Thyroid cancer Sister   . Diabetes Sister   . Hypertension Brother   . Heart attack Brother   . Stroke Sister   . Breast cancer Neg Hx     SOCIAL HISTORY: Social History   Socioeconomic History  . Marital status: Married    Spouse name: Not on file  . Number of children: Not on file  . Years of  education: Not on file  . Highest education level: Not on file  Occupational History  . Occupation: Retired  Tobacco Use  . Smoking status: Never Smoker  . Smokeless tobacco: Never Used  Substance and Sexual Activity  . Alcohol use: No    Alcohol/week: 0.0 standard drinks  . Drug use: No  . Sexual activity: Not on file  Other Topics Concern  . Not on file  Social History Narrative   ** Merged History Encounter **  Lives home alone.  Divorced x 2.  College.  Medical office assistant hx.         Social Determinants of Health   Financial Resource Strain:   . Difficulty of Paying Living Expenses:   Food Insecurity:   . Worried About Charity fundraiser in the Last Year:   . Arboriculturist in the Last Year:   Transportation Needs:   . Film/video editor (Medical):   Marland Kitchen Lack of Transportation (Non-Medical):   Physical Activity:   . Days of  Exercise per Week:   . Minutes of Exercise per Session:   Stress:   . Feeling of Stress :   Social Connections:   . Frequency of Communication with Friends and Family:   . Frequency of Social Gatherings with Friends and Family:   . Attends Religious Services:   . Active Member of Clubs or Organizations:   . Attends Archivist Meetings:   Marland Kitchen Marital Status:   Intimate Partner Violence:   . Fear of Current or Ex-Partner:   . Emotionally Abused:   Marland Kitchen Physically Abused:   . Sexually Abused:      PHYSICAL EXAM  GENERAL EXAM/CONSTITUTIONAL: Vitals:  Vitals:   05/19/19 1419  BP: 136/81  Pulse: 71  Temp: (!) 97.3 F (36.3 C)  Weight: 208 lb (94.3 kg)  Height: 5' 2.5" (1.588 m)   Body mass index is 37.44 kg/m. Wt Readings from Last 3 Encounters:  05/19/19 208 lb (94.3 kg)  03/31/19 206 lb 4.8 oz (93.6 kg)  01/29/19 206 lb 6.4 oz (93.6 kg)   No exam data present    Patient is in no distress; well developed, nourished and groomed; neck is supple  CARDIOVASCULAR:  Examination of carotid arteries is normal; no  carotid bruits  Regular rate and rhythm, no murmurs  Examination of peripheral vascular system by observation and palpation is normal  EYES:  Ophthalmoscopic exam of optic discs and posterior segments is normal; no papilledema or hemorrhages No exam data present  MUSCULOSKELETAL:  Gait, strength, tone, movements noted in Neurologic exam below  NEUROLOGIC: MENTAL STATUS:  No flowsheet data found.  awake, alert, oriented to person, place and time  recent and remote memory intact  normal attention and concentration  language fluent, comprehension intact, naming intact  fund of knowledge appropriate  CRANIAL NERVE:   2nd - no papilledema on fundoscopic exam  2nd, 3rd, 4th, 6th - pupils 22mm equal and reactive to light, visual fields full to confrontation, extraocular muscles intact, no nystagmus  5th - facial sensation symmetric  7th - facial strength symmetric  8th - hearing intact  9th - palate elevates symmetrically, uvula midline  11th - shoulder shrug symmetric  12th - tongue protrusion midline  MOTOR:   normal bulk and tone, full strength in the BUE, BLE  SENSORY:   normal and symmetric to light touch, temperature, vibration  COORDINATION:   finger-nose-finger, fine finger movements normal  REFLEXES:   deep tendon reflexes present and symmetric  GAIT/STATION:   narrow based gait; ROMBERG NEGATIVE     DIAGNOSTIC DATA (LABS, IMAGING, TESTING) - I reviewed patient records, labs, notes, testing and imaging myself where available.  Lab Results  Component Value Date   WBC 4.0 11/18/2018   HGB 13.5 11/18/2018   HCT 40.4 11/18/2018   MCV 81 11/18/2018   PLT 259 11/18/2018      Component Value Date/Time   NA 142 11/18/2018 1451   K 4.3 11/18/2018 1451   CL 104 11/18/2018 1451   CO2 20 11/18/2018 1451   GLUCOSE 84 11/18/2018 1451   GLUCOSE 155 (H) 01/27/2018 0700   BUN 16 11/18/2018 1451   CREATININE 1.25 (H) 11/18/2018 1451    CREATININE 1.03 (H) 11/19/2014 1131   CALCIUM 9.4 11/18/2018 1451   PROT 7.0 11/18/2018 1451   ALBUMIN 4.2 11/18/2018 1451   AST 19 11/18/2018 1451   ALT 20 11/18/2018 1451   ALKPHOS 76 11/18/2018 1451   BILITOT 0.3 11/18/2018 1451   GFRNONAA 44 (  L) 11/18/2018 1451   GFRNONAA 57 (L) 11/19/2014 1131   GFRAA 50 (L) 11/18/2018 1451   GFRAA 65 11/19/2014 1131   Lab Results  Component Value Date   CHOL 161 11/19/2014   HDL 46 11/19/2014   LDLCALC 96 11/19/2014   LDLDIRECT 182.8 04/27/2010   TRIG 93 11/19/2014   CHOLHDL 3.5 11/19/2014   Lab Results  Component Value Date   HGBA1C 6.2 (H) 01/23/2018   Lab Results  Component Value Date   [id number] 01/23/2018   Lab Results  Component Value Date   TSH 0.98 04/27/2010   Lab Results  Component Value Date   ANA Negative 01/23/2018   01/23/18 HIV Screen 4th Generation wRfx Non Reactive  01/22/18 MRI brain and orbits [I reviewed images myself and agree with interpretation. -VRP]  1. Abnormal enhancement involving the right optic nerve as above, suggesting acute optic neuritis. 2. No other acute intracranial abnormality. 3. Mild chronic small vessel ischemic disease for age. 4. Multiple scattered chronic micro hemorrhages involving the right thalamus and pons, most likely related to chronic underlying Hypertension.  02/16/18 MRI of the cervical spine with and without contrast shows the following: [I reviewed images myself and agree with interpretation. -VRP]  1.    There is a small nonenhancing T2 hyperintense focus within the spinal cord adjacent to the C4 vertebral body/C4-C5 interspace.  On the axial views, the increased signal appears to be in the anterior horns.  This pattern would be more consistent with sequela of a compressive myelopathy though demyelination cannot be completely ruled out. 2.    Multilevel degenerative changes causing mild spinal stenosis at C3-C4, C4-C5, C5-C6 and C6-C7.  At C4-C5 there is potential for  right C5 nerve root compression. 3.    There is a normal enhancement pattern.  02/16/18 MRI of the thoracic spine with and without contrast shows the following: [I reviewed images myself and agree with interpretation. -VRP]  1.    The spinal cord appears normal. 2.    Mild multilevel degenerative changes as detailed above that do not lead to nerve root compression or spinal stenosis. 3.    No acute findings and a normal enhancement pattern.   01/30/18  NMO IgG Autoantibodies 0.0 - 3.0 U/mL 260.6High        ASSESSMENT AND PLAN  69 y.o. year old female here with right optic neuritis (right eye; retrobulbar) and positive NMO antibodies.  Dx:  1. 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 spinal cord lesion) - continue rituxan every 6 months - check CBC, CMP every 6 months (rituxan monitoring)  Kidney function (Cr 1.25) - monitor; follow up with PCP  Orders Placed This Encounter  Procedures  . CBC with Differential/Platelet  . Comprehensive metabolic panel   Return in about 6 months (around 11/19/2019).    Penni Bombard, MD [id number], [id number] PM Certified in Neurology, Neurophysiology and Neuroimaging  [facility] [address], [place], [place] [address] [phone]

## 2019-05-19 NOTE — Addendum Note (Signed)
Addended by: Inis Sizer D on: 05/19/2019 03:03 PM   Modules accepted: Orders

## 2019-05-20 ENCOUNTER — Telehealth: Payer: Self-pay | Admitting: *Deleted

## 2019-05-20 LAB — CBC WITH DIFFERENTIAL/PLATELET
Basophils Absolute: 0 10*3/uL (ref 0.0–0.2)
Basos: 1 %
EOS (ABSOLUTE): 0.1 10*3/uL (ref 0.0–0.4)
Eos: 2 %
Hematocrit: 40.5 % (ref 34.0–46.6)
Hemoglobin: 13.4 g/dL (ref 11.1–15.9)
Immature Grans (Abs): 0 10*3/uL (ref 0.0–0.1)
Immature Granulocytes: 1 %
Lymphocytes Absolute: 1.3 10*3/uL (ref 0.7–3.1)
Lymphs: 30 %
MCH: 27.3 pg (ref 26.6–33.0)
MCHC: 33.1 g/dL (ref 31.5–35.7)
MCV: 83 fL (ref 79–97)
Monocytes Absolute: 0.7 10*3/uL (ref 0.1–0.9)
Monocytes: 16 %
Neutrophils Absolute: 2.2 10*3/uL (ref 1.4–7.0)
Neutrophils: 50 %
Platelets: 249 10*3/uL (ref 150–450)
RBC: 4.9 x10E6/uL (ref 3.77–5.28)
RDW: 13.7 % (ref 11.7–15.4)
WBC: 4.3 10*3/uL (ref 3.4–10.8)

## 2019-05-20 LAB — COMPREHENSIVE METABOLIC PANEL
ALT: 24 IU/L (ref 0–32)
AST: 22 IU/L (ref 0–40)
Albumin/Globulin Ratio: 1.7 (ref 1.2–2.2)
Albumin: 4.3 g/dL (ref 3.8–4.8)
Alkaline Phosphatase: 73 IU/L (ref 39–117)
BUN/Creatinine Ratio: 15 (ref 12–28)
BUN: 19 mg/dL (ref 8–27)
Bilirubin Total: 0.3 mg/dL (ref 0.0–1.2)
CO2: 20 mmol/L (ref 20–29)
Calcium: 9.9 mg/dL (ref 8.7–10.3)
Chloride: 107 mmol/L — ABNORMAL HIGH (ref 96–106)
Creatinine, Ser: 1.25 mg/dL — ABNORMAL HIGH (ref 0.57–1.00)
GFR calc Af Amer: 50 mL/min/{1.73_m2} — ABNORMAL LOW (ref >59)
GFR calc non Af Amer: 44 mL/min/{1.73_m2} — ABNORMAL LOW (ref >59)
Globulin, Total: 2.6 g/dL (ref 1.5–4.5)
Glucose: 69 mg/dL (ref 65–99)
Potassium: 4.2 mmol/L (ref 3.5–5.2)
Sodium: 142 mmol/L (ref 134–144)
Total Protein: 6.9 g/dL (ref 6.0–8.5)

## 2019-05-20 NOTE — Telephone Encounter (Signed)
Spoke with patient and informed her that her lab results are unremarkable labs. With the exception of borderline kidney function which is similar to a lab 6 months ago. Dr Leta Baptist will continue current plan. She will inform her PCP. Patient verbalized understanding, appreciation.

## 2019-05-21 ENCOUNTER — Other Ambulatory Visit: Payer: Self-pay | Admitting: Internal Medicine

## 2019-05-21 DIAGNOSIS — E7800 Pure hypercholesterolemia, unspecified: Secondary | ICD-10-CM

## 2019-05-27 ENCOUNTER — Ambulatory Visit (INDEPENDENT_AMBULATORY_CARE_PROVIDER_SITE_OTHER): Payer: Medicare HMO | Admitting: Physician Assistant

## 2019-05-27 ENCOUNTER — Encounter: Payer: Self-pay | Admitting: Physician Assistant

## 2019-05-27 ENCOUNTER — Other Ambulatory Visit: Payer: Self-pay

## 2019-05-27 DIAGNOSIS — D485 Neoplasm of uncertain behavior of skin: Secondary | ICD-10-CM | POA: Diagnosis not present

## 2019-05-27 DIAGNOSIS — D489 Neoplasm of uncertain behavior, unspecified: Secondary | ICD-10-CM

## 2019-05-27 DIAGNOSIS — L309 Dermatitis, unspecified: Secondary | ICD-10-CM | POA: Diagnosis not present

## 2019-05-27 DIAGNOSIS — L308 Other specified dermatitis: Secondary | ICD-10-CM | POA: Diagnosis not present

## 2019-05-27 DIAGNOSIS — T7840XA Allergy, unspecified, initial encounter: Secondary | ICD-10-CM | POA: Diagnosis not present

## 2019-05-27 MED ORDER — TRIAMCINOLONE ACETONIDE 0.1 % EX CREA: TOPICAL_CREAM | CUTANEOUS | 1 refills | Status: DC

## 2019-05-27 NOTE — Progress Notes (Addendum)
   New Patient   Subjective  Michelle Hurley is a 69 y.o AA. female who presents for the following: Skin Problem (RASH X 1 YEAR FROM POST SCALP TO TOES, AFTER HOSPITAL STAY WITH HIGH DOSES OF PREDNISONE TREATMENT OTC CUREL [place]). Severe pruritis. Curel helps calm it down a small amount. ROS negative.   The following portions of the chart were reviewed this encounter and updated as appropriate: Tobacco  Allergies  Meds  Problems  Med Hx  Surg Hx  Fam Hx      Objective  Well appearing patient in no apparent distress; mood and affect are within normal limits.  A full examination was performed including scalp, head, eyes, ears, nose, lips, neck, chest, axillae, abdomen, back, buttocks, bilateral upper extremities, bilateral lower extremities, hands, feet, fingers, toes, fingernails, and toenails. All findings within normal limits unless otherwise noted below.  Objective  Left Upper Arm: Excoriations,papules, crusts with PIH.      Objective  Right Upper Arm: Papules, crusts with PIH.      Assessment & Plan  Neoplasm of uncertain behavior (2) Left Upper Arm  triamcinolone cream (KENALOG) 0.1 %  Skin / nail biopsy Type of biopsy: tangential   Informed consent: discussed and consent obtained   Timeout: patient name, date of birth, surgical site, and procedure verified   Anesthesia: the lesion was anesthetized in a standard fashion   Anesthetic:  1% lidocaine w/ epinephrine 1-100,000 local infiltration Instrument used: flexible razor blade   Hemostasis achieved with: aluminum chloride and electrodesiccation   Outcome: patient tolerated procedure well   Post-procedure details: wound care instructions given    Specimen 1 - Surgical pathology Differential Diagnosis: urticaria, rule out contact dermatitis, atopic dermatitis Check Margins: No   Right Upper Arm  triamcinolone cream (KENALOG) 0.1 %  Skin / nail biopsy Type of biopsy: tangential    Informed consent: discussed and consent obtained   Timeout: patient name, date of birth, surgical site, and procedure verified   Anesthesia: the lesion was anesthetized in a standard fashion   Anesthetic:  1% lidocaine w/ epinephrine 1-100,000 local infiltration Instrument used: flexible razor blade   Hemostasis achieved with: aluminum chloride and electrodesiccation   Outcome: patient tolerated procedure well   Post-procedure details: wound care instructions given    Specimen 2 - Surgical pathology Differential Diagnosis: urticaria Check Margins: No   I, Dontavia Brand, PA-C, have reviewed all documentation for this visit. The documentation on 09/12/19 for the exam, diagnosis, procedures, and orders are all accurate and complete.

## 2019-05-27 NOTE — Patient Instructions (Signed)

## 2019-05-29 ENCOUNTER — Telehealth: Payer: Self-pay | Admitting: Physician Assistant

## 2019-05-29 NOTE — Telephone Encounter (Signed)
Patient calling for results of biopsy. Epic only

## 2019-06-02 ENCOUNTER — Telehealth: Payer: Self-pay | Admitting: Physician Assistant

## 2019-06-02 NOTE — Telephone Encounter (Signed)
Phone call to patient to inform her that [facility] hasn't reviewed her results yet and once she does then someone will give her a call back with the results.

## 2019-06-03 NOTE — Telephone Encounter (Signed)
Discussed in detail her results. Attempted to get the timing of her rash and its association with medication.  She is about the same. Triamcinalone made her itch worse.

## 2019-06-10 ENCOUNTER — Telehealth: Payer: Self-pay | Admitting: Physician Assistant

## 2019-06-10 NOTE — Telephone Encounter (Signed)
Patient calling to give update. She wanted Kelli to know that she stopped using her lotion and has only been using Triamcinolone and her skin has improved. She would like to know what to do now.

## 2019-06-11 DIAGNOSIS — H43823 Vitreomacular adhesion, bilateral: Secondary | ICD-10-CM | POA: Diagnosis not present

## 2019-06-11 DIAGNOSIS — H469 Unspecified optic neuritis: Secondary | ICD-10-CM | POA: Diagnosis not present

## 2019-06-11 NOTE — Telephone Encounter (Signed)
As long as she is improved.  Keep using it.  If it flares again- call us.

## 2019-06-13 NOTE — Telephone Encounter (Signed)
Phone call to patient to let her know that she can continue using the cream and to call us if it flares back up. Patient is doing a lot better.

## 2019-07-08 ENCOUNTER — Other Ambulatory Visit: Payer: Self-pay

## 2019-07-08 ENCOUNTER — Ambulatory Visit (INDEPENDENT_AMBULATORY_CARE_PROVIDER_SITE_OTHER): Payer: Medicare HMO | Admitting: Internal Medicine

## 2019-07-08 ENCOUNTER — Encounter: Payer: Self-pay | Admitting: Internal Medicine

## 2019-07-08 DIAGNOSIS — I10 Essential (primary) hypertension: Secondary | ICD-10-CM | POA: Diagnosis not present

## 2019-07-08 DIAGNOSIS — N6320 Unspecified lump in the left breast, unspecified quadrant: Secondary | ICD-10-CM | POA: Diagnosis not present

## 2019-07-08 DIAGNOSIS — L299 Pruritus, unspecified: Secondary | ICD-10-CM

## 2019-07-08 DIAGNOSIS — Z0000 Encounter for general adult medical examination without abnormal findings: Secondary | ICD-10-CM

## 2019-07-08 DIAGNOSIS — L282 Other prurigo: Secondary | ICD-10-CM

## 2019-07-08 NOTE — Patient Instructions (Signed)
Ms. Richner,   Thanks for seeing me today. I am glad to hear that you are doing well. I hope you have fun on all your upcoming trip!  Take care!

## 2019-07-08 NOTE — Assessment & Plan Note (Addendum)
Dermatitis: After her referral to the dermatologist, a biopsy was performed which Showed dermal hypersensitivity reaction, sparse perivascular dermatitis.  She reports much improvement to her rash and itching after she began using anti-itch cream as well as triamcinolone cream that was provided to her by the dermatologist.

## 2019-07-08 NOTE — Assessment & Plan Note (Signed)
Hypertension: Well-controlled.  BP Readings from Last 3 Encounters:  07/08/19 140/83  05/19/19 136/81  03/31/19 132/75   Plan:  -Continue Coreg 25 mg daily -Continue Lasix 40 mg daily -Continue spironolactone 25 mg daily -Continue verapamil 60 mg 3 times daily

## 2019-07-08 NOTE — Assessment & Plan Note (Signed)
>>  ASSESSMENT AND PLAN FOR BREAST MASS, LEFT WRITTEN ON 07/08/2019  2:10 PM BY AGYEI, OBED K, MD  Left breast mass: Biopsy revealed fibrocystic changes with no evidence of malignancy

## 2019-07-08 NOTE — Assessment & Plan Note (Signed)
Health maintenance: Up-to-date.  Received Covid vaccine on 04/18/2019

## 2019-07-08 NOTE — Assessment & Plan Note (Signed)
Left breast mass: Biopsy revealed fibrocystic changes with no evidence of malignancy

## 2019-07-08 NOTE — Progress Notes (Signed)
   CC: Follow up HTN  HPI:  [name] is a 69 y.o. very pleasant African-American woman with medical history listed below presented to follow-up on chronic medical problems.  Please see problem based charting for further details.  Past Medical History:  Diagnosis Date  . Abdominal pain 05/22/2014  . Allergic rhinitis 04/16/2015  . Anxiety state, unspecified   . ARTHRALGIA UNSPECIFIED SITE 12/04/2008   Qualifier: Diagnosis of  By: Melvyn Novas MD, Christena Deem   . Arthritis   . Atherosclerosis of abdominal aorta ([place]) 10/01/2015   Aortoiliac atherosclerosis noted on CT abdomen in 2016.  Marland Kitchen Cough with congestion of paranasal sinus 06/01/2015  . Dizziness 04/06/2012  . Essential hypertension 12/24/2006  . Estrogen deficiency 11/16/2016  . GERD (gastroesophageal reflux disease) 08/07/2018  . H/O: hysterectomy   . Hx-TIA (transient ischemic attack) 04/06/2012  . Hypertension   . Hypokalemia 02/12/2018  . Influenza-like illness 02/02/2016  . Loss of part of visual field   . Morbid obesity ([place])   . Myopia   . Nausea with vomiting 05/23/2014  . 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 NSVT (nonsustained ventricular tachycardia) ([place])    a. 01/2005 Echo: EF 55-65%  . Osteopenia   . Other and unspecified hyperlipidemia   . Palpitations   . Plantar fasciitis, bilateral 06/12/2016  . Rash 03/23/2017  . Right ankle injury, initial encounter 06/12/2016  . Right elbow pain 06/11/2017  . Right leg pain 09/06/2016  . Right optic neuritis 01/22/2018  . Sciatica 01/15/2018  . Shoulder pain, bilateral (Concern for CAD) 08/28/2018  . Umbilical hernia   . Unspecified essential hypertension   . Vaginal candidiasis 02/12/2018  . VENTRICULAR TACHYCARDIA 12/04/2008   F/u  lhc Hochrein    . Viral upper respiratory tract infection 01/15/2018  . Vitamin D deficiency 02/12/2018   Review of Systems:  As per HPI  Physical Exam:  Vitals:   07/08/19 1323  BP: 140/83  Pulse: 75  Temp: 98.3 F (36.8 C)  TempSrc: Oral    SpO2: 100%  Weight: 206 lb 1.6 oz (93.5 kg)  Height: 5\' 2"  (1.575 m)   Physical Exam Constitutional:      General: She is not in acute distress. Cardiovascular:     Rate and Rhythm: Normal rate.     Heart sounds: No murmur heard.   Pulmonary:     Effort: No respiratory distress.     Breath sounds: Normal breath sounds. No wheezing.  Skin:    Coloration: Skin is not jaundiced.     Findings: No abrasion, signs of injury or lesion. Rash: Improved, diffused on bilateral upper and lower extremities.       Neurological:     Mental Status: She is alert.     Assessment & Plan:   See Encounters Tab for problem based charting.  Patient discussed with Dr. Philipp Ovens

## 2019-07-09 NOTE — Progress Notes (Signed)
Internal Medicine Clinic Attending  Case discussed with Dr. Agyei at the time of the visit.  We reviewed the resident's history and exam and pertinent patient test results.  I agree with the assessment, diagnosis, and plan of care documented in the resident's note.    

## 2019-07-24 ENCOUNTER — Other Ambulatory Visit: Payer: Self-pay | Admitting: Internal Medicine

## 2019-07-29 ENCOUNTER — Ambulatory Visit (INDEPENDENT_AMBULATORY_CARE_PROVIDER_SITE_OTHER): Payer: Medicare HMO | Admitting: Internal Medicine

## 2019-07-29 ENCOUNTER — Other Ambulatory Visit: Payer: Self-pay

## 2019-07-29 DIAGNOSIS — J309 Allergic rhinitis, unspecified: Secondary | ICD-10-CM | POA: Diagnosis not present

## 2019-07-29 MED ORDER — LEVOCETIRIZINE DIHYDROCHLORIDE 5 MG PO TABS: 5.0000 mg | ORAL_TABLET | Freq: Every evening | ORAL | 0 refills | Status: DC

## 2019-07-29 NOTE — Progress Notes (Signed)
[facility] Internal Medicine Residency Telephone Encounter Continuity Care Appointment  HPI:   This telephone encounter was created for Michelle Hurley on 07/29/2019 for the following purpose/cc: sneezing.  Michelle Hurley is a 69 yo F w/ PMH of HTN, Neuromyelitis optica, GERD, HLD presenting to [facility] via telehealth with complaint of sneezing. She mentions that she was in her usual state of health until about a week ago when she began to endorse  rhinitis, cough, frequent sneezing and sinus congestion. She mentions that she has had previous hx of allergic rhinitis and this feel similar. She mentions that she has taken Claritin without significant improvement in her symptoms. She denies any fevers, chills, nausea, vomiting, chest pain, dyspnea.    Past Medical History:  Past Medical History:  Diagnosis Date  . Abdominal pain 05/22/2014  . Allergic rhinitis 04/16/2015  . Anxiety state, unspecified   . ARTHRALGIA UNSPECIFIED SITE 12/04/2008   Qualifier: Diagnosis of  By: Melvyn Novas MD, Christena Deem   . Arthritis   . Atherosclerosis of abdominal aorta ([place]) 10/01/2015   Aortoiliac atherosclerosis noted on CT abdomen in 2016.  Marland Kitchen Atypical chest pain 12/11/2018  . Cough with congestion of paranasal sinus 06/01/2015  . Dizziness 04/06/2012  . Essential hypertension 12/24/2006  . Estrogen deficiency 11/16/2016  . GERD (gastroesophageal reflux disease) 08/07/2018  . H/O: hysterectomy   . Hx-TIA (transient ischemic attack) 04/06/2012  . Hypertension   . Hypokalemia 02/12/2018  . Influenza-like illness 02/02/2016  . Loss of part of visual field   . Morbid obesity ([place])   . Myopia   . Nausea with vomiting 05/23/2014  . 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 NSVT (nonsustained ventricular tachycardia) ([place])    a. 01/2005 Echo: EF 55-65%  . Osteopenia   . Other and unspecified hyperlipidemia   . Palpitations   . Plantar fasciitis, bilateral 06/12/2016  . Rash 03/23/2017  . Right ankle injury, initial encounter  06/12/2016  . Right elbow pain 06/11/2017  . Right leg pain 09/06/2016  . Right optic neuritis 01/22/2018  . Sciatica 01/15/2018  . Shoulder pain, bilateral (Concern for CAD) 08/28/2018  . Shoulder pain, bilateral (Concern for CAD) 08/28/2018  . Umbilical hernia   . Unspecified essential hypertension   . Vaginal candidiasis 02/12/2018  . VENTRICULAR TACHYCARDIA 12/04/2008   F/u  lhc Hochrein    . Viral upper respiratory tract infection 01/15/2018  . Vitamin D deficiency 02/12/2018      ROS:  Review of Systems  Constitutional: Negative for chills, fever and malaise/fatigue.  HENT: Positive for congestion.   Respiratory: Negative for shortness of breath.   Cardiovascular: Negative for chest pain.  Gastrointestinal: Negative for constipation, diarrhea, nausea and vomiting.  Neurological: Negative for dizziness, sensory change and weakness.  All other systems reviewed and are negative.     Assessment / Plan / Recommendations:   Please see A&P under problem oriented charting for assessment of the patient's acute and chronic medical conditions.   As always, pt is advised that if symptoms worsen or new symptoms arise, they should go to an urgent care facility or to to ER for further evaluation.   Consent and Medical Decision Making:   Patient discussed with Dr. Dareen Piano  This is a telephone encounter between Michelle Hurley and Mosetta Anis on 07/29/2019 for runny nose. The visit was conducted with the patient located at home and Mosetta Anis at [facility]. The patient's identity was confirmed using their DOB and current address. The patient has  consented to being evaluated through a telephone encounter and understands the associated risks (an examination cannot be done and the patient may need to come in for an appointment) / benefits (allows the patient to remain at home, decreasing exposure to coronavirus). I personally spent 10 minutes on medical discussion.

## 2019-07-30 ENCOUNTER — Encounter: Payer: Self-pay | Admitting: Internal Medicine

## 2019-07-30 NOTE — Assessment & Plan Note (Signed)
Presents w/ complaints of sinus congestion, rhinitis and sneezing. No associated fevers, chills, fatigue concerning for serious infection. Had previous episodes during April with pollen but no obvious triggers this episode. Symptoms lasting despite loratidine use.  A/P History suggests allergic rhinitis. No suspicion for bacterial sinusitis considering prior hx of allergic rhinitis and duration. Can trial alternative anti-histamine as she is not having improvement on loratidine  - Start levocetirizine 5mg  prn - Advised on sinus rinse, importance of avoiding triggers

## 2019-07-31 NOTE — Progress Notes (Signed)
Internal Medicine Clinic Attending  Case discussed with Dr. Lee  At the time of the visit.  We reviewed the resident's history and exam and pertinent patient test results.  I agree with the assessment, diagnosis, and plan of care documented in the resident's note.    

## 2019-08-06 DIAGNOSIS — H469 Unspecified optic neuritis: Secondary | ICD-10-CM | POA: Diagnosis not present

## 2019-08-20 DIAGNOSIS — H469 Unspecified optic neuritis: Secondary | ICD-10-CM | POA: Diagnosis not present

## 2019-08-27 ENCOUNTER — Other Ambulatory Visit: Payer: Self-pay | Admitting: Internal Medicine

## 2019-08-27 DIAGNOSIS — J309 Allergic rhinitis, unspecified: Secondary | ICD-10-CM

## 2019-08-27 MED ORDER — LEVOCETIRIZINE DIHYDROCHLORIDE 5 MG PO TABS: 5.0000 mg | ORAL_TABLET | Freq: Every evening | ORAL | 0 refills | Status: DC

## 2019-08-27 NOTE — Telephone Encounter (Signed)
Need refill on levocetirizine (XYZAL) 5 MG tablet  ;pt contact [place] ([place]), [place] [facility] [place]

## 2019-09-08 NOTE — Addendum Note (Signed)
Addended by: Warren Danes on: 09/08/2019 02:33 PM   Modules accepted: Orders

## 2019-09-09 DIAGNOSIS — H25811 Combined forms of age-related cataract, right eye: Secondary | ICD-10-CM | POA: Diagnosis not present

## 2019-09-09 DIAGNOSIS — H43823 Vitreomacular adhesion, bilateral: Secondary | ICD-10-CM | POA: Diagnosis not present

## 2019-09-09 DIAGNOSIS — H2512 Age-related nuclear cataract, left eye: Secondary | ICD-10-CM | POA: Diagnosis not present

## 2019-09-09 DIAGNOSIS — H468 Other optic neuritis: Secondary | ICD-10-CM | POA: Diagnosis not present

## 2019-09-23 ENCOUNTER — Encounter: Payer: Self-pay | Admitting: Physician Assistant

## 2019-09-23 ENCOUNTER — Telehealth: Payer: Self-pay | Admitting: *Deleted

## 2019-09-23 ENCOUNTER — Ambulatory Visit (INDEPENDENT_AMBULATORY_CARE_PROVIDER_SITE_OTHER): Payer: Medicare HMO | Admitting: Physician Assistant

## 2019-09-23 ENCOUNTER — Other Ambulatory Visit: Payer: Self-pay

## 2019-09-23 DIAGNOSIS — L2084 Intrinsic (allergic) eczema: Secondary | ICD-10-CM

## 2019-09-23 MED ORDER — DUPIXENT 300 MG/2ML ~~LOC~~ SOAJ: 600.0000 mg | Freq: Once | SUBCUTANEOUS | 0 refills | Status: AC

## 2019-09-23 MED ORDER — CLOBETASOL PROP EMOLLIENT BASE 0.05 % EX CREA: 1.0000 "application " | TOPICAL_CREAM | Freq: Two times a day (BID) | CUTANEOUS | 3 refills | Status: DC

## 2019-09-23 MED ORDER — DUPIXENT 300 MG/2ML ~~LOC~~ SOSY: 300.0000 mg | PREFILLED_SYRINGE | SUBCUTANEOUS | 3 refills | Status: DC

## 2019-09-23 MED ORDER — PIMECROLIMUS 1 % EX CREA: TOPICAL_CREAM | Freq: Two times a day (BID) | CUTANEOUS | 4 refills | Status: DC

## 2019-09-23 NOTE — Progress Notes (Signed)
   Follow-Up Visit   Subjective  Michelle Hurley is a 69 y.o. female who presents for the following: Eczema (all over body- head,elbows or forehead tx-TAC cream- helps some but keeps breaking out).   The following portions of the chart were reviewed this encounter and updated as appropriate: Tobacco  Allergies  Meds  Problems  Med Hx  Surg Hx  Fam Hx      Objective  Well appearing patient in no apparent distress; mood and affect are within normal limits.  All skin waist up examined.  Objective  both arms, chest, back: PIH with xerosis and excoriations.  Assessment & Plan  Intrinsic atopic dermatitis both arms, chest, back  Gain approval for dupixent.  Ordered Medications: pimecrolimus (ELIDEL) 1 % cream Clobetasol Prop Emollient Base (CLOBETASOL PROPIONATE E) 0.05 % emollient cream Dupilumab (DUPIXENT) 300 MG/2ML SOPN dupilumab (DUPIXENT) 300 MG/2ML prefilled syringe   I, Britney Captain, PA-C, have reviewed all documentation's for this visit.  The documentation on 09/23/19 for the exam, diagnosis, procedures and orders are all accurate and complete.

## 2019-09-23 NOTE — Telephone Encounter (Signed)
I called Michelle Hurley and she said to just send in prescription for dupixent because there are some humana that copay is $9.00- told not to send in forms, they will mail those to patient If needed,  All she needs is 2 high potency steroids & BSA.  Faxed senderra form

## 2019-09-25 ENCOUNTER — Ambulatory Visit (INDEPENDENT_AMBULATORY_CARE_PROVIDER_SITE_OTHER): Payer: Medicare HMO | Admitting: Internal Medicine

## 2019-09-25 ENCOUNTER — Other Ambulatory Visit: Payer: Self-pay

## 2019-09-25 ENCOUNTER — Encounter: Payer: Self-pay | Admitting: Internal Medicine

## 2019-09-25 VITALS — BP 151/90 | HR 70 | Temp 98.8°F | Ht 62.0 in | Wt 210.3 lb

## 2019-09-25 DIAGNOSIS — L282 Other prurigo: Secondary | ICD-10-CM | POA: Diagnosis not present

## 2019-09-25 DIAGNOSIS — I10 Essential (primary) hypertension: Secondary | ICD-10-CM

## 2019-09-25 DIAGNOSIS — M549 Dorsalgia, unspecified: Secondary | ICD-10-CM | POA: Insufficient documentation

## 2019-09-25 DIAGNOSIS — M546 Pain in thoracic spine: Secondary | ICD-10-CM

## 2019-09-25 DIAGNOSIS — Z23 Encounter for immunization: Secondary | ICD-10-CM | POA: Diagnosis not present

## 2019-09-25 DIAGNOSIS — J309 Allergic rhinitis, unspecified: Secondary | ICD-10-CM

## 2019-09-25 HISTORY — DX: Dorsalgia, unspecified: M54.9

## 2019-09-25 MED ORDER — LEVOCETIRIZINE DIHYDROCHLORIDE 5 MG PO TABS: 5.0000 mg | ORAL_TABLET | Freq: Every evening | ORAL | 0 refills | Status: DC

## 2019-09-25 MED ORDER — CYCLOBENZAPRINE HCL 5 MG PO TABS: 5.0000 mg | ORAL_TABLET | Freq: Three times a day (TID) | ORAL | 0 refills | Status: DC | PRN

## 2019-09-25 NOTE — Assessment & Plan Note (Signed)
Right upper back pain: Michelle Hurley reports that for the past 2 weeks she has been experiencing right upper back pain medial to her right scapula.  She states that she used to suffer from "gas pain "however this time her symptoms has not been relieved with Gas-X.  She states that on Sunday, her sister was rubbing her back and discovered that it was swollen.  She describes her pain as sharp, aching in nature.  The pain is relieved when she lies supine and leans on the wall.  She denies nausea, vomiting, lower extremity weakness, urinary or bowel incontinence.  She does not report of any rashes in her back.  On physical exams, medial right scapular was tender to palpation, no erythema, swelling, rash or obvious skin changes.  Assessment and plan: Less likely shingles.  Pain is musculoskeletal in nature.  Plan: -Trial of Flexeril/Tylenol

## 2019-09-25 NOTE — Assessment & Plan Note (Signed)
Hypertension: Uncontrolled.  Her blood pressures have been well during my past previous clinic visits with her.  However today was elevated in the 150s/80s-90s.  BP Readings from Last 3 Encounters:  09/25/19 (!) 151/90  07/08/19 140/83  05/19/19 136/81   Goal blood pressure is <140/90  Had a discussion regarding increasing spironolactone however patient states that due to her pain which might be contributing to her elevated blood pressure, she would like to wait on any medication changes.  Plan: -Continue Coreg 25 mg daily -Continue Lasix 40 mg daily -Continue spironolactone 25 mg daily -Continue verapamil 60 mg 3 times daily

## 2019-09-25 NOTE — Patient Instructions (Signed)
Ms. Mercer,  Thanks for seeing me today.  In regards to your upper back pain, I will try you on a muscle relaxant.  For the meantime, he can try Tylenol to see if that brings relief.  You can also try heating pads as well.  For your blood pressure, please record your blood pressure in the morning and evening for 2 weeks and call me with the results.  Take care! Dr. Eileen Stanford  Please call the internal medicine center clinic if you have any questions or concerns, we may be able to help and keep you from a long and expensive emergency room wait. Our clinic and after hours phone number is [phone], the best time to call is Monday through Friday 9 am to 4 pm but there is always someone available 24/7 if you have an emergency. If you need medication refills please notify your pharmacy one week in advance and they will send [name] a request.

## 2019-09-25 NOTE — Progress Notes (Signed)
   CC: Follow-up hypertension  HPI:  [name] is a 69 y.o. with medical history significant for hypertension, ventricular tachycardia, GERD, neuromyelitis optica presenting for follow-up.  Please see problem based charting for further details.  Past Medical History:  Diagnosis Date  . Abdominal pain 05/22/2014  . Allergic rhinitis 04/16/2015  . Anxiety state, unspecified   . ARTHRALGIA UNSPECIFIED SITE 12/04/2008   Qualifier: Diagnosis of  By: Melvyn Novas MD, Christena Deem   . Arthritis   . Atherosclerosis of abdominal aorta ([place]) 10/01/2015   Aortoiliac atherosclerosis noted on CT abdomen in 2016.  Marland Kitchen Atypical chest pain 12/11/2018  . Cough with congestion of paranasal sinus 06/01/2015  . Dizziness 04/06/2012  . Essential hypertension 12/24/2006  . Estrogen deficiency 11/16/2016  . GERD (gastroesophageal reflux disease) 08/07/2018  . H/O: hysterectomy   . Hx-TIA (transient ischemic attack) 04/06/2012  . Hypertension   . Hypokalemia 02/12/2018  . Influenza-like illness 02/02/2016  . Loss of part of visual field   . Morbid obesity ([place])   . Myopia   . Nausea with vomiting 05/23/2014  . 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 NSVT (nonsustained ventricular tachycardia) ([place])    a. 01/2005 Echo: EF 55-65%  . Osteopenia   . Other and unspecified hyperlipidemia   . Palpitations   . Plantar fasciitis, bilateral 06/12/2016  . Rash 03/23/2017  . Right ankle injury, initial encounter 06/12/2016  . Right elbow pain 06/11/2017  . Right leg pain 09/06/2016  . Right optic neuritis 01/22/2018  . Sciatica 01/15/2018  . Shoulder pain, bilateral (Concern for CAD) 08/28/2018  . Shoulder pain, bilateral (Concern for CAD) 08/28/2018  . Umbilical hernia   . Unspecified essential hypertension   . Vaginal candidiasis 02/12/2018  . VENTRICULAR TACHYCARDIA 12/04/2008   F/u  lhc Hochrein    . Viral upper respiratory tract infection 01/15/2018  . Vitamin D deficiency 02/12/2018   Review of Systems:  AS per  HPI  Physical Exam:  Vitals:   09/25/19 1448 09/25/19 1536  BP: (!) 152/87 (!) 151/90  Pulse: 68 70  Temp: 98.8 F (37.1 C)   TempSrc: Oral   SpO2: 100%   Weight: 210 lb 4.8 oz (95.4 kg)   Height: 5\' 2"  (1.575 m)    Physical Exam HENT:     Head: Normocephalic and atraumatic.  Cardiovascular:     Rate and Rhythm: Normal rate.     Heart sounds: Normal heart sounds.  Pulmonary:     Effort: Pulmonary effort is normal.     Breath sounds: No rales.  Musculoskeletal:       Arms:     Comments: Area is tender to palpation. No rash, erythema or skin changes  Neurological:     Mental Status: She is alert.  Psychiatric:        Mood and Affect: Mood normal.        Behavior: Behavior normal.     Assessment & Plan:   See Encounters Tab for problem based charting.  Patient discussed with Dr. Daryll Drown

## 2019-09-25 NOTE — Assessment & Plan Note (Signed)
Follows up with dermatology.  States dermatology is planning on starting [place].

## 2019-09-26 ENCOUNTER — Telehealth: Payer: Self-pay | Admitting: *Deleted

## 2019-09-26 NOTE — Telephone Encounter (Signed)
Dupixent approved

## 2019-09-30 NOTE — Progress Notes (Signed)
Internal Medicine Clinic Attending  Case discussed with Dr. Agyei  At the time of the visit.  We reviewed the resident's history and exam and pertinent patient test results.  I agree with the assessment, diagnosis, and plan of care documented in the resident's note.  

## 2019-10-01 ENCOUNTER — Other Ambulatory Visit: Payer: Self-pay

## 2019-10-01 ENCOUNTER — Telehealth: Payer: Self-pay | Admitting: *Deleted

## 2019-10-01 DIAGNOSIS — E7800 Pure hypercholesterolemia, unspecified: Secondary | ICD-10-CM

## 2019-10-01 MED ORDER — LOVASTATIN 20 MG PO TABS: 20.0000 mg | ORAL_TABLET | Freq: Every day | ORAL | 1 refills | Status: DC

## 2019-10-16 ENCOUNTER — Telehealth: Payer: Self-pay | Admitting: *Deleted

## 2019-10-16 NOTE — Telephone Encounter (Signed)
RTC, c/o ongoing back pain.  States her back pain is better and pain level went from an 11 to a 6, but her upper back pain is still constant.  Request an appt, she is currently out of town.  Appt scheduled for Monday, 10/20/19 @ 1:15 with Dr. Blythe Stanford team. Laurence Compton, RN,BSN

## 2019-10-16 NOTE — Telephone Encounter (Signed)
Please call patient.  She completed the medicine given for her pain but is still having problems.

## 2019-10-16 NOTE — Telephone Encounter (Signed)
Thank you for setting the appt up.

## 2019-10-20 ENCOUNTER — Encounter: Payer: Self-pay | Admitting: Internal Medicine

## 2019-10-20 ENCOUNTER — Ambulatory Visit (INDEPENDENT_AMBULATORY_CARE_PROVIDER_SITE_OTHER): Payer: Medicare HMO | Admitting: Internal Medicine

## 2019-10-20 ENCOUNTER — Telehealth: Payer: Self-pay | Admitting: Diagnostic Neuroimaging

## 2019-10-20 ENCOUNTER — Ambulatory Visit (INDEPENDENT_AMBULATORY_CARE_PROVIDER_SITE_OTHER): Payer: Medicare HMO

## 2019-10-20 ENCOUNTER — Other Ambulatory Visit: Payer: Self-pay

## 2019-10-20 VITALS — BP 126/76 | HR 69 | Temp 98.1°F | Ht 62.0 in | Wt 212.3 lb

## 2019-10-20 DIAGNOSIS — Z0000 Encounter for general adult medical examination without abnormal findings: Secondary | ICD-10-CM

## 2019-10-20 DIAGNOSIS — L2089 Other atopic dermatitis: Secondary | ICD-10-CM | POA: Diagnosis not present

## 2019-10-20 DIAGNOSIS — R200 Anesthesia of skin: Secondary | ICD-10-CM | POA: Insufficient documentation

## 2019-10-20 DIAGNOSIS — G360 Neuromyelitis optica [Devic]: Secondary | ICD-10-CM

## 2019-10-20 MED ORDER — DUPILUMAB 300 MG/2ML ~~LOC~~ SOSY
600.0000 mg | PREFILLED_SYRINGE | Freq: Once | SUBCUTANEOUS | Status: AC
  Administered 2019-10-20: 600 mg via SUBCUTANEOUS

## 2019-10-20 NOTE — Progress Notes (Signed)
CC: Right arm numbness  HPI: [name] is a 69 y.o. with PMH listed below presenting with complaint of right arm numbness. Please see problem based assessment and plan for further details.  Past Medical History:  Diagnosis Date  . Abdominal pain 05/22/2014  . Allergic rhinitis 04/16/2015  . Anxiety state, unspecified   . ARTHRALGIA UNSPECIFIED SITE 12/04/2008   Qualifier: Diagnosis of  By: Melvyn Novas MD, Christena Deem   . Arthritis   . Atherosclerosis of abdominal aorta ([place]) 10/01/2015   Aortoiliac atherosclerosis noted on CT abdomen in 2016.  Marland Kitchen Atypical chest pain 12/11/2018  . Cough with congestion of paranasal sinus 06/01/2015  . Dizziness 04/06/2012  . Essential hypertension 12/24/2006  . Estrogen deficiency 11/16/2016  . GERD (gastroesophageal reflux disease) 08/07/2018  . H/O: hysterectomy   . Hx-TIA (transient ischemic attack) 04/06/2012  . Hypertension   . Hypokalemia 02/12/2018  . Influenza-like illness 02/02/2016  . Loss of part of visual field   . Morbid obesity ([place])   . Myopia   . Nausea with vomiting 05/23/2014  . 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 NSVT (nonsustained ventricular tachycardia) ([place])    a. 01/2005 Echo: EF 55-65%  . Osteopenia   . Other and unspecified hyperlipidemia   . Palpitations   . Plantar fasciitis, bilateral 06/12/2016  . Rash 03/23/2017  . Right ankle injury, initial encounter 06/12/2016  . Right elbow pain 06/11/2017  . Right leg pain 09/06/2016  . Right optic neuritis 01/22/2018  . Sciatica 01/15/2018  . Shoulder pain, bilateral (Concern for CAD) 08/28/2018  . Shoulder pain, bilateral (Concern for CAD) 08/28/2018  . Umbilical hernia   . Unspecified essential hypertension   . Vaginal candidiasis 02/12/2018  . VENTRICULAR TACHYCARDIA 12/04/2008   F/u  [facility]    . Viral upper respiratory tract infection 01/15/2018  . Vitamin D deficiency 02/12/2018   Review of Systems: Review of Systems  Constitutional: Negative for chills, fever and  malaise/fatigue.  Eyes: Negative for blurred vision.  Respiratory: Negative for shortness of breath.   Cardiovascular: Negative for chest pain, palpitations and leg swelling.  Gastrointestinal: Negative for constipation, diarrhea, nausea and vomiting.  Genitourinary: Negative for dysuria.  Musculoskeletal: Positive for back pain.  Neurological: Negative for dizziness, sensory change and weakness.  All other systems reviewed and are negative.   Physical Exam: Vitals:   10/20/19 1321 10/20/19 1344  BP: (!) 143/67 126/76  Pulse: 69   Temp: 98.1 F (36.7 C)   TempSrc: Oral   SpO2: 100%   Weight: 212 lb 4.8 oz (96.3 kg)   Height: 5\' 2"  (1.575 m)    Gen: Well-developed, well nourished, NAD HEENT: NCAT head, hearing intact CV: RRR, S1, S2 normal Pulm: CTAB, No rales, no wheezes Extm: ROM intact, Peripheral pulses intact, No peripheral edema, Tenderness to palpation over R subscapular are Skin: Dry, Warm, normal turgor, no wounds, no rashes, no lesions Neuro: Mental status: A&Ox3 Cranial Nerves:             II: PERRL             III, IV, VI: Extra-occular motions intact bilaterally             V, VII: Face symmetric, sensation intact in all 3 divisions               VIII: hearing normal to rubbing fingers bilaterally               IX, X: palate rises  symmetrically             XI: Head turn and shoulder shrug normal bilaterally               XII: tongue midline    Motor: Strength 5/5 on all upper and lower extremities, bulk muscle and tone are normal  Deep Tendon Reflexes: 2+ symmetric Gait: Normal  Sensory: Diminished sensation to light touch on Right side T2 dermatome Coordination: There is no dysmetria on finger-to-nose. Rapid alternating movement test normal. Heel to shin test normal. 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 Mentions q54month rituximab infusions and states that she was told she may have  life-long neurological symptoms by her neurologist. She is unsure when her last infusion was but mentions having an appointment coming up in January.   A/p New neurological sxs possible due to flare. Will get Mri and advised to f/u with neurology - F/u w/ neurology  Right arm numbness [name] is a 69 yo F w/ PMH of neuromyelitis optica, obesity, HTN, GERD presenting to [facility] w/ complaint of back pain associated with new numbness. She was in her usual state of health until 1 month ago when she developed worsening Right upper back pain associated with swelling. She was seen by [facility] at the time and her pain was attributed to musculoskeletal pain. She was prescribed flexeril and discharged. Since then, she has had no improvement in pain with flexeril and have been relying on Tylenol 200mg  q6hr for pain. She mentions the pain has improved the severity from 10/10 to 6/10 but mentions having significant debilitating effect which prevents her from performing her IADLs, such as laundry and dish washing. She has been relying more on her sisters who live nearby for these tasks. She mentions that for the last couple weeks, she has also noted new numbness under her R arm that seems to be associated with the pain. [name] any new focal weakness or vision changes.  A/P Present w/ acute back pain with associated numbness. Physical exam shows some tenderness to palpation and new numbness in T2 dermatome. Chart review shows previous T-spine imaging with negative findings. History and description of pain more consistent w/ musculoskeletal pain but new numbness concerning as she has had previous demyelinating disease w/ neuromyelitis optica with positive antibodies. Will get further work-up with MRI.   - Increase Tylenol to max dose - Referral to Physical therapy - MR brain, cervical, thoracic spine - F/u w/ neurology  Healthcare maintenance Information regarding timing of COVID booster shots as well as shingles  vaccines provided.  Patient discussed with Dr. Rebeca Alert  -Gilberto Better, [facility] Pager: [phone]

## 2019-10-20 NOTE — Telephone Encounter (Signed)
Michelle Hurley, Pt came into the lobby because she just came from her primary who told her Dr Leta Baptist could get her an MRI scheduled faster than he can because of her situation. She is in a lot of pain and had an 11/10 appt, I just rescheduled sooner for 11/27. Can you call her to see if the MRI can be scheduled based on her diagnosis from primary and she stated notes will show in her chart. Thank you

## 2019-10-20 NOTE — Assessment & Plan Note (Signed)
Information regarding timing of COVID booster shots as well as shingles vaccines provided.

## 2019-10-20 NOTE — Patient Instructions (Addendum)
Dear [name],  Thank you for allowing [name] to provide your care today. Today we discussed your arm pain    I have ordered no labs for you. I will call if any are abnormal.    Today we made the following changes to your medications:    You can increase your tylenol to max of 4000mg  daily. That would be 2 x 650mg  every 8 hours for maximum dose  Please follow-up with neurology as soon as you can  You can take your COVID booster shot 6 months after your last Moderna shot (11/16/2019)  You can take your Shingles shot 1 month after your last Dupixent dose (11/20/19)  Should you have any questions or concerns please call the internal medicine clinic at [phone].    Thank you for choosing [facility].   Acute Back Pain, Adult Acute back pain is sudden and usually short-lived. It is often caused by an injury to the muscles and tissues in the back. The injury may result from:  A muscle or ligament getting overstretched or torn (strained). Ligaments are tissues that connect bones to each other. Lifting something improperly can cause a back strain.  Wear and tear (degeneration) of the spinal disks. Spinal disks are circular tissue that provides cushioning between the bones of the spine (vertebrae).  Twisting motions, such as while playing sports or doing yard work.  A hit to the back.  Arthritis. You may have a physical exam, lab tests, and imaging tests to find the cause of your pain. Acute back pain usually goes away with rest and home care. Follow these instructions at home: Managing pain, stiffness, and swelling  Take over-the-counter and prescription medicines only as told by your health care provider.  Your health care provider may recommend applying ice during the first 24-48 hours after your pain starts. To do this: ? Put ice in a plastic bag. ? Place a towel between your skin and the bag. ? Leave the ice on for 20 minutes, 2-3 times a day.  If directed, apply  heat to the affected area as often as told by your health care provider. Use the heat source that your health care provider recommends, such as a moist heat pack or a heating pad. ? Place a towel between your skin and the heat source. ? Leave the heat on for 20-30 minutes. ? Remove the heat if your skin turns bright red. This is especially important if you are unable to feel pain, heat, or cold. You have a greater risk of getting burned. Activity   Do not stay in bed. Staying in bed for more than 1-2 days can delay your recovery.  Sit up and stand up straight. Avoid leaning forward when you sit, or hunching over when you stand. ? If you work at a desk, sit close to it so you do not need to lean over. Keep your chin tucked in. Keep your neck drawn back, and keep your elbows bent at a right angle. Your Hurley should look like the letter "L." ? Sit high and close to the steering wheel when you drive. Add lower back (lumbar) support to your car seat, if needed.  Take short walks on even surfaces as soon as you are able. Try to increase the length of time you walk each day.  Do not sit, drive, or stand in one place for more than 30 minutes at a time. Sitting or standing for long periods of time can put stress  on your back.  Do not drive or use heavy machinery while taking prescription pain medicine.  Use proper lifting techniques. When you bend and lift, use positions that put less stress on your back: ? [place] your knees. ? Keep the load close to your body. ? Avoid twisting.  Exercise regularly as told by your health care provider. Exercising helps your back heal faster and helps prevent back injuries by keeping muscles strong and flexible.  Work with a physical therapist to make a safe exercise program, as recommended by your health care provider. Do any exercises as told by your physical therapist. Lifestyle  Maintain a healthy weight. Extra weight puts stress on your back and makes it  difficult to have good posture.  Avoid activities or situations that make you feel anxious or stressed. Stress and anxiety increase muscle tension and can make back pain worse. Learn ways to manage anxiety and stress, such as through exercise. General instructions  Sleep on a firm mattress in a comfortable position. Try lying on your side with your knees slightly bent. If you lie on your back, put a pillow under your knees.  Follow your treatment plan as told by your health care provider. This may include: ? Cognitive or behavioral therapy. ? Acupuncture or massage therapy. ? Meditation or yoga. Contact a health care provider if:  You have pain that is not relieved with rest or medicine.  You have increasing pain going down into your legs or buttocks.  Your pain does not improve after 2 weeks.  You have pain at night.  You lose weight without trying.  You have a fever or chills. Get help right away if:  You develop new bowel or bladder control problems.  You have unusual weakness or numbness in your [place]ley or legs.  You develop nausea or vomiting.  You develop abdominal pain.  You feel faint. Summary  Acute back pain is sudden and usually short-lived.  Use proper lifting techniques. When you bend and lift, use positions that put less stress on your back.  Take over-the-counter and prescription medicines and apply heat or ice as directed by your health care provider. This information is not intended to replace advice given to you by your health care provider. Make sure you discuss any questions you have with your health care provider. Document Revised: 04/16/2018 Document Reviewed: 08/09/2016 Elsevier Patient Education  [place].

## 2019-10-20 NOTE — Assessment & Plan Note (Signed)
Michelle Hurley is a 69 yo F w/ PMH of neuromyelitis optica, obesity, HTN, GERD presenting to [facility] w/ complaint of back pain associated with new numbness. She was in her usual state of health until 1 month ago when she developed worsening Right upper back pain associated with swelling. She was seen by [facility] at the time and her pain was attributed to musculoskeletal pain. She was prescribed flexeril and discharged. Since then, she has had no improvement in pain with flexeril and have been relying on Tylenol 200mg  q6hr for pain. She mentions the pain has improved the severity from 10/10 to 6/10 but mentions having significant debilitating effect which prevents her from performing her IADLs, such as laundry and dish washing. She has been relying more on her sisters who live nearby for these tasks. She mentions that for the last couple weeks, she has also noted new numbness under her R arm that seems to be associated with the pain. [name] any new focal weakness or vision changes.  A/P Present w/ acute back pain with associated numbness. Physical exam shows some tenderness to palpation and new numbness in T2 dermatome. Chart review shows previous T-spine imaging with negative findings. History and description of pain more consistent w/ musculoskeletal pain but new numbness concerning as she has had previous demyelinating disease w/ neuromyelitis optica with positive antibodies. Will get further work-up with MRI.   - Increase Tylenol to max dose - Referral to Physical therapy - MR brain, cervical, thoracic spine - F/u w/ neurology

## 2019-10-20 NOTE — Telephone Encounter (Signed)
Called patient and advised her that she's not seen Dr Leta Baptist since May. If PCP feels she needs urgent MRIs the order can state urgent to hasten the process. I advised her it has to be authorized by insurance before scheduling, and since orders are already in place, her PCP office should go forward with approval and scheduling. She stated PCP won't give pain medicine not knowing what is causing her pain. She reported one month of right sided back pain, now in shoulder blade, and her right armpit is numb x 2 weeks, but not painful. PCP advised she take Tylenol 650 mg three x daily,which eases it so she can function. I rescheduled her to be seen this Wed, advised she arrive 30 minutes early to check in, bring new insurance cards. She verbalized understanding, appreciation.

## 2019-10-20 NOTE — Telephone Encounter (Signed)
Will see pt in clinic this Wednesday. -VRP

## 2019-10-20 NOTE — Progress Notes (Signed)
PATIENT SUPPLIED DRUG TRAINING ONLY.  [id number] 01 2024 LEFT ABDOMEN [id number] 01 2024 RIGHT ABDOMEN

## 2019-10-20 NOTE — Assessment & Plan Note (Signed)
Denies any vision changes. Mentions q37month rituximab infusions and states that she was told she may have life-long neurological symptoms by her neurologist. She is unsure when her last infusion was but mentions having an appointment coming up in January.   A/p New neurological sxs possible due to flare. Will get Mri and advised to f/u with neurology - F/u w/ neurology

## 2019-10-21 NOTE — Telephone Encounter (Signed)
Spoke with [employer]ra and the patient has called and requested the change and they have started the shipping process for the auto injector.

## 2019-10-21 NOTE — Progress Notes (Signed)
Internal Medicine Clinic Attending  Case discussed with Dr. Lee at the time of the visit.  We reviewed the resident's history and exam and pertinent patient test results.  I agree with the assessment, diagnosis, and plan of care documented in the resident's note.  Brynn Reznik, M.D., Ph.D.  

## 2019-10-22 ENCOUNTER — Encounter: Payer: Self-pay | Admitting: Diagnostic Neuroimaging

## 2019-10-22 ENCOUNTER — Ambulatory Visit (INDEPENDENT_AMBULATORY_CARE_PROVIDER_SITE_OTHER): Payer: Medicare HMO | Admitting: Diagnostic Neuroimaging

## 2019-10-22 VITALS — BP 147/90 | HR 80 | Ht 62.0 in | Wt 209.2 lb

## 2019-10-22 DIAGNOSIS — M546 Pain in thoracic spine: Secondary | ICD-10-CM | POA: Diagnosis not present

## 2019-10-22 MED ORDER — GABAPENTIN 100 MG PO CAPS: 100.0000 mg | ORAL_CAPSULE | Freq: Three times a day (TID) | ORAL | 6 refills | Status: DC

## 2019-10-22 NOTE — Patient Instructions (Signed)
-   gabapentin 100mg  at bedtime; may increase up to 100mg  three times a day  - follow up MRI scans

## 2019-10-22 NOTE — Progress Notes (Signed)
[facility]  PATIENT: Michelle Hurley DOB: [birth year]  REFERRING CLINICIAN: ER  HISTORY FROM: patient and chart REASON FOR VISIT: follow up   HISTORICAL  CHIEF COMPLAINT:  Chief Complaint  Patient presents with  . Neuromyelitis Optica    rm 6, early FU d/t symptoms, MRI brain, C& T spine 11/10/19    HISTORY OF PRESENT ILLNESS:   UPDATE (10/22/2019, VRP): Since last visit, doing well until 3 to 4 weeks ago when she developed right thoracic and flank pain, numbness and tingling in her right axillary region and right triceps region.  Patient tried muscle relaxers from PCP without relief.  Patient tried over-the-counter topical pain relievers without relief.  Patient has had MRI of the brain, cervical and thoracic spine ordered and scheduled for the next few weeks.  No other triggering factors.  Some tenderness to palpation just below her shoulder blade on the right side.  Vision stable.  Lower extremity stable.  Left arm stable.  UPDATE (05/19/19, VRP): Since last visit, doing well. Had [place] vaccine in March / April 2021; doing well. Last rituxan infusion in Jan 2021. Symptoms are stable. No alleviating or aggravating factors.     UPDATE (11/18/18, VRP): Since last visit, doing well. Symptoms are improved. Right eye vision almost back to normal. No alleviating or aggravating factors. Tolerating rituxan; next dose in Dec 2020.   VIDEO VISIT UPDATE (05/15/18, VRP):  - overall feels well - vision is improved - no pain - waiting on insurance approval for rituxan  PRIOR HPI (01/30/18): 69 year old female with hypertension, here for evaluation of right retrobulbar, idiopathic optic neuritis.  01/22/2018 patient presented to the hospital after seeing her ophthalmologist for new onset right eye vision loss.  She reported an altitudinal superior visual field defect with some pain.  No problems in the left eye.  Upon admission patient had MRI of the brain and orbits  which showed enhancing right optic nerve lesion.  Mild chronic small vessel ischemic disease was also noted as well as scattered chronic cerebral microhemorrhages.  Patient was diagnosed with idiopathic optic neuritis.  She was treated with IV Solu-Medrol.  She had some side effects of hallucinations and irritability.  She completed her course and was discharged home.  Since that time symptoms are continuing to improve.  No problems with arms, legs, balance, bowel or bladder dysfunction.   REVIEW OF SYSTEMS: Full 14 system review of systems performed and negative with exception of: as per HPI.   ALLERGIES: Allergies  Allergen Reactions  . Mushroom Extract Complex Anaphylaxis, Itching and Swelling    Swelling all over body   . Codeine Nausea And Vomiting  . Hydrocodone Nausea And Vomiting  . Prednisone Nausea And Vomiting  . Omeprazole Itching    HOME MEDICATIONS: Outpatient Medications Prior to Visit  Medication Sig Dispense Refill  . aspirin EC 81 MG tablet Take 1 tablet (81 mg total) by mouth daily. 90 tablet 1  . carvedilol (COREG) 25 MG tablet TAKE 1 TABLET TWICE DAILY WITH MEALS 180 tablet 1  . Clobetasol Prop Emollient Base (CLOBETASOL PROPIONATE E) 0.05 % emollient cream Apply 1 application topically 2 (two) times daily. 60 g 3  . cyclobenzaprine (FLEXERIL) 5 MG tablet Take 1 tablet (5 mg total) by mouth 3 (three) times daily as needed for muscle spasms. 30 tablet 0  . dupilumab (DUPIXENT) 300 MG/2ML prefilled syringe Inject 300 mg into the skin every 14 (fourteen) days. Starting at day 15 for maintenance. 4  mL 3  . furosemide (LASIX) 40 MG tablet TAKE 1 TABLET EVERY DAY 90 tablet 1  . levocetirizine (XYZAL) 5 MG tablet Take 1 tablet (5 mg total) by mouth every evening. 30 tablet 0  . lovastatin (MEVACOR) 20 MG tablet Take 1 tablet (20 mg total) by mouth at bedtime. 90 tablet 1  . pimecrolimus (ELIDEL) 1 % cream Apply topically 2 (two) times daily. 100 g 4  . polyethylene  glycol powder (GLYCOLAX/MIRALAX) powder Take 17 g by mouth daily as needed for moderate constipation or severe constipation. 255 g 0  . RITUXAN 500 MG/50ML injection 05/14/18 not started yet    . spironolactone (ALDACTONE) 25 MG tablet TAKE 1 TABLET EVERY DAY 90 tablet 1  . triamcinolone cream (KENALOG) 0.1 % Apply to affected area after bathing 453 g 1  . verapamil (CALAN) 120 MG tablet TAKE 1/2 TABLET THREE TIMES DAILY 135 tablet 3  . omeprazole (PRILOSEC) 40 MG capsule TAKE 1 CAPSULE EVERY DAY 90 capsule 2   No facility-administered medications prior to visit.    PAST MEDICAL HISTORY: Past Medical History:  Diagnosis Date  . Abdominal pain 05/22/2014  . Allergic rhinitis 04/16/2015  . Anxiety state, unspecified   . ARTHRALGIA UNSPECIFIED SITE 12/04/2008   Qualifier: Diagnosis of  By: Melvyn Novas MD, Christena Deem   . Arthritis   . Atherosclerosis of abdominal aorta ([place]) 10/01/2015   Aortoiliac atherosclerosis noted on CT abdomen in 2016.  Marland Kitchen Atypical chest pain 12/11/2018  . Cough with congestion of paranasal sinus 06/01/2015  . Dizziness 04/06/2012  . Essential hypertension 12/24/2006  . Estrogen deficiency 11/16/2016  . GERD (gastroesophageal reflux disease) 08/07/2018  . H/O: hysterectomy   . Hx-TIA (transient ischemic attack) 04/06/2012  . Hypertension   . Hypokalemia 02/12/2018  . Influenza-like illness 02/02/2016  . Loss of part of visual field   . Morbid obesity ([place])   . Myopia   . Nausea with vomiting 05/23/2014  . 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 NSVT (nonsustained ventricular tachycardia) ([place])    a. 01/2005 Echo: EF 55-65%  . Osteopenia   . Other and unspecified hyperlipidemia   . Palpitations   . Plantar fasciitis, bilateral 06/12/2016  . Rash 03/23/2017  . Right ankle injury, initial encounter 06/12/2016  . Right elbow pain 06/11/2017  . Right leg pain 09/06/2016  . Right optic neuritis 01/22/2018  . Sciatica 01/15/2018  . Shoulder pain, bilateral (Concern for CAD) 08/28/2018  .  Shoulder pain, bilateral (Concern for CAD) 08/28/2018  . Umbilical hernia   . Unspecified essential hypertension   . Vaginal candidiasis 02/12/2018  . VENTRICULAR TACHYCARDIA 12/04/2008   F/u  [facility]    . Viral upper respiratory tract infection 01/15/2018  . Vitamin D deficiency 02/12/2018    PAST SURGICAL HISTORY: Past Surgical History:  Procedure Laterality Date  . ABDOMINAL HYSTERECTOMY  1974  . ESOPHAGOGASTRODUODENOSCOPY N/A 05/28/2014   Procedure: ESOPHAGOGASTRODUODENOSCOPY (EGD);  Surgeon: Teena Irani, MD;  Location: [facility];  Service: Endoscopy;  Laterality: N/A;  . LAPAROSCOPIC SMALL BOWEL RESECTION N/A 06/04/2014   Procedure: LAPAROSCOPIC SMALL BOWEL RESECTION;  Surgeon: Fanny Skates, MD;  Location: [facility];  Service: General;  Laterality: N/A;  . LAPAROSCOPIC TRANSABDOMINAL HERNIA N/A 05/03/2014   Procedure: LAPAROSCOPIC EXPLORATION WITH OPEN REPAIR OF INCARCERATED EPIGASTRIC HERNIA ;  Surgeon: Alphonsa Overall, MD;  Location: [facility];  Service: General;  Laterality: N/A;    FAMILY HISTORY: Family History  Problem Relation Age of Onset  . Heart disease Mother   .  Diabetes Mother   . Lung cancer Mother   . Thyroid cancer Sister   . Diabetes Sister   . Hypertension Brother   . Heart attack Brother   . Stroke Sister   . Breast cancer Neg Hx     SOCIAL HISTORY: Social History   Socioeconomic History  . Marital status: Divorced    Spouse name: Not on file  . Number of children: Not on file  . Years of education: college  . Highest education level: Not on file  Occupational History  . Occupation: Retired  Tobacco Use  . Smoking status: Never Smoker  . Smokeless tobacco: Never Used  Vaping Use  . Vaping Use: Never used  Substance and Sexual Activity  . Alcohol use: No    Alcohol/week: 0.0 standard drinks  . Drug use: No  . Sexual activity: Not on file  Other Topics Concern  . Not on file  Social History Narrative   Lives home alone.  Divorced x 2.  College.   Medical office assistant hx.         Social Determinants of Health   Financial Resource Strain:   . Difficulty of Paying Living Expenses: Not on file  Food Insecurity:   . Worried About Charity fundraiser in the Last Year: Not on file  . Ran Out of Food in the Last Year: Not on file  Transportation Needs:   . Lack of Transportation (Medical): Not on file  . Lack of Transportation (Non-Medical): Not on file  Physical Activity:   . Days of Exercise per Week: Not on file  . Minutes of Exercise per Session: Not on file  Stress:   . Feeling of Stress : Not on file  Social Connections:   . Frequency of Communication with Friends and Family: Not on file  . Frequency of Social Gatherings with Friends and Family: Not on file  . Attends Religious Services: Not on file  . Active Member of Clubs or Organizations: Not on file  . Attends Archivist Meetings: Not on file  . Marital Status: Not on file  Intimate Partner Violence:   . Fear of Current or Ex-Partner: Not on file  . Emotionally Abused: Not on file  . Physically Abused: Not on file  . Sexually Abused: Not on file     PHYSICAL EXAM  GENERAL EXAM/CONSTITUTIONAL: Vitals:  Vitals:   10/22/19 1437  BP: (!) 147/90  Pulse: 80  Weight: 209 lb 3.2 oz (94.9 kg)  Height: 5\' 2"  (1.575 m)   Body mass index is 38.26 kg/m. Wt Readings from Last 3 Encounters:  10/22/19 209 lb 3.2 oz (94.9 kg)  10/20/19 212 lb 4.8 oz (96.3 kg)  09/25/19 210 lb 4.8 oz (95.4 kg)   No exam data present   Patient is in no distress; well developed, nourished and groomed; neck is supple  CARDIOVASCULAR:  Examination of carotid arteries is normal; no carotid bruits  Regular rate and rhythm, no murmurs  Examination of peripheral vascular system by observation and palpation is normal  EYES:  Ophthalmoscopic exam of optic discs and posterior segments is normal; no papilledema or hemorrhages No exam data  present  MUSCULOSKELETAL:  Gait, strength, tone, movements noted in Neurologic exam below  NEUROLOGIC: MENTAL STATUS:  No flowsheet data found.  awake, alert, oriented to person, place and time  recent and remote memory intact  normal attention and concentration  language fluent, comprehension intact, naming intact  fund of knowledge appropriate  CRANIAL NERVE:   2nd - no papilledema on fundoscopic exam  2nd, 3rd, 4th, 6th - pupils 64mm equal and reactive to light, visual fields full to confrontation, extraocular muscles intact, no nystagmus  5th - facial sensation symmetric  7th - facial strength symmetric  8th - hearing intact  9th - palate elevates symmetrically, uvula midline  11th - shoulder shrug symmetric  12th - tongue protrusion midline  MOTOR:   normal bulk and tone, full strength in the BUE, BLE  SENSORY:   normal and symmetric to light touch, temperature, vibration  COORDINATION:   finger-nose-finger, fine finger movements normal  REFLEXES:   deep tendon reflexes present and symmetric  GAIT/STATION:   narrow based gait     DIAGNOSTIC DATA (LABS, IMAGING, TESTING) - I reviewed patient records, labs, notes, testing and imaging myself where available.  Lab Results  Component Value Date   WBC 4.3 05/19/2019   HGB 13.4 05/19/2019   HCT 40.5 05/19/2019   MCV 83 05/19/2019   PLT 249 05/19/2019      Component Value Date/Time   NA 142 05/19/2019 1506   K 4.2 05/19/2019 1506   CL 107 (H) 05/19/2019 1506   CO2 20 05/19/2019 1506   GLUCOSE 69 05/19/2019 1506   GLUCOSE 155 (H) 01/27/2018 0700   BUN 19 05/19/2019 1506   CREATININE 1.25 (H) 05/19/2019 1506   CREATININE 1.03 (H) 11/19/2014 1131   CALCIUM 9.9 05/19/2019 1506   PROT 6.9 05/19/2019 1506   ALBUMIN 4.3 05/19/2019 1506   AST 22 05/19/2019 1506   ALT 24 05/19/2019 1506   ALKPHOS 73 05/19/2019 1506   BILITOT 0.3 05/19/2019 1506   GFRNONAA 44 (L) 05/19/2019 1506   GFRNONAA  57 (L) 11/19/2014 1131   GFRAA 50 (L) 05/19/2019 1506   GFRAA 65 11/19/2014 1131   Lab Results  Component Value Date   CHOL 161 11/19/2014   HDL 46 11/19/2014   LDLCALC 96 11/19/2014   LDLDIRECT 182.8 04/27/2010   TRIG 93 11/19/2014   CHOLHDL 3.5 11/19/2014   Lab Results  Component Value Date   HGBA1C 6.2 (H) 01/23/2018   Lab Results  Component Value Date   ONGEXBMW41 324 01/23/2018   Lab Results  Component Value Date   TSH 0.98 04/27/2010   Lab Results  Component Value Date   ANA Negative 01/23/2018   01/23/18 HIV Screen 4th Generation wRfx Non Reactive  01/22/18 MRI brain and orbits [I reviewed images myself and agree with interpretation. -VRP]  1. Abnormal enhancement involving the right optic nerve as above, suggesting acute optic neuritis. 2. No other acute intracranial abnormality. 3. Mild chronic small vessel ischemic disease for age. 4. Multiple scattered chronic micro hemorrhages involving the right thalamus and pons, most likely related to chronic underlying Hypertension.  02/16/18 MRI of the cervical spine with and without contrast shows the following: [I reviewed images myself and agree with interpretation. -VRP]  1.    There is a small nonenhancing T2 hyperintense focus within the spinal cord adjacent to the C4 vertebral body/C4-C5 interspace.  On the axial views, the increased signal appears to be in the anterior horns.  This pattern would be more consistent with sequela of a compressive myelopathy though demyelination cannot be completely ruled out. 2.    Multilevel degenerative changes causing mild spinal stenosis at C3-C4, C4-C5, C5-C6 and C6-C7.  At C4-C5 there is potential for right C5 nerve root compression. 3.    There is a normal enhancement pattern.  02/16/18 MRI of the thoracic spine with and without contrast shows the following: [I reviewed images myself and agree with interpretation. -VRP]  1.    The spinal cord appears normal. 2.    Mild multilevel  degenerative changes as detailed above that do not lead to nerve root compression or spinal stenosis. 3.    No acute findings and a normal enhancement pattern.   01/30/18  NMO IgG Autoantibodies 0.0 - 3.0 U/mL 260.6High        ASSESSMENT AND PLAN  69 y.o. year old female here with right optic neuritis (right eye; retrobulbar) and positive NMO antibodies.  Dx:  1. Acute right-sided thoracic back pain      PLAN:  RIGHT THORACIC PAIN / RIGHT ARM PAIN / NUMBNESS - trial of gabapentin - follow up MRI brain, c and t spine (ordered by PCP)  Neuromyelitis optica (+ NMO ab; + optic neuritis; + ? spinal cord lesion) - continue rituxan every 6 months - check CBC, CMP every 6 months (rituxan monitoring)  Kidney function (Cr 1.25) - monitor; follow up with PCP  Meds ordered this encounter  Medications  . gabapentin (NEURONTIN) 100 MG capsule    Sig: Take 1 capsule (100 mg total) by mouth 3 (three) times daily.    Dispense:  90 capsule    Refill:  6   Return in about 6 months (around 04/21/2020).    Penni Bombard, MD 22/41/1464, 3:14 PM Certified in Neurology, Neurophysiology and Neuroimaging  [facility] [address] [place], [place] [address] [phone]

## 2019-11-03 ENCOUNTER — Encounter: Payer: Self-pay | Admitting: *Deleted

## 2019-11-05 ENCOUNTER — Ambulatory Visit: Payer: Medicare HMO | Admitting: Diagnostic Neuroimaging

## 2019-11-10 ENCOUNTER — Ambulatory Visit
Admission: RE | Admit: 2019-11-10 | Discharge: 2019-11-10 | Disposition: A | Discharge status: Home / Self Care | Payer: Medicare HMO | Source: Ambulatory Visit | Attending: Internal Medicine | Admitting: Internal Medicine

## 2019-11-10 DIAGNOSIS — M5124 Other intervertebral disc displacement, thoracic region: Secondary | ICD-10-CM | POA: Diagnosis not present

## 2019-11-10 DIAGNOSIS — M47814 Spondylosis without myelopathy or radiculopathy, thoracic region: Secondary | ICD-10-CM | POA: Diagnosis not present

## 2019-11-10 DIAGNOSIS — R200 Anesthesia of skin: Secondary | ICD-10-CM

## 2019-11-10 DIAGNOSIS — G9389 Other specified disorders of brain: Secondary | ICD-10-CM | POA: Diagnosis not present

## 2019-11-10 DIAGNOSIS — Q283 Other malformations of cerebral vessels: Secondary | ICD-10-CM | POA: Diagnosis not present

## 2019-11-10 DIAGNOSIS — M4802 Spinal stenosis, cervical region: Secondary | ICD-10-CM | POA: Diagnosis not present

## 2019-11-10 DIAGNOSIS — M25511 Pain in right shoulder: Secondary | ICD-10-CM | POA: Diagnosis not present

## 2019-11-10 DIAGNOSIS — H538 Other visual disturbances: Secondary | ICD-10-CM | POA: Diagnosis not present

## 2019-11-10 DIAGNOSIS — G379 Demyelinating disease of central nervous system, unspecified: Secondary | ICD-10-CM | POA: Diagnosis not present

## 2019-11-10 MED ORDER — GADOBENATE DIMEGLUMINE 529 MG/ML IV SOLN
20.0000 mL | Freq: Once | INTRAVENOUS | Status: AC | PRN
  Administered 2019-11-10: 20 mL via INTRAVENOUS

## 2019-11-11 ENCOUNTER — Telehealth: Payer: Self-pay | Admitting: Dermatology

## 2019-11-11 ENCOUNTER — Telehealth: Payer: Self-pay | Admitting: Internal Medicine

## 2019-11-11 NOTE — Telephone Encounter (Signed)
Phone call to patient to inform her that she will need to contact [facility] to get her [place] shipped to her.  Patient stated that she needed [facility]'s number.  [facility]'s number given to patient.

## 2019-11-11 NOTE — Telephone Encounter (Signed)
Patient is calling to speak with someone about the [place].   Patient states that the nurse that trained her for the injection told her that she was not supposed to get the syringe of Dupixent but a different form.  Patient did get the syringe and the same nurse told her she would get right oon it to get it changed, but Michelle Hurley has not been contacted by anyone from the office and wants to know what she needs to do to get the correct prescription.  Patient is about to run out of [place] so will need prescription.

## 2019-11-11 NOTE — Telephone Encounter (Signed)
Discussed with [name] regarding findings of her MRI and discussed findings spondylosis and degenerative changes in her cervical spine. She mentions that her back pain has resolved with gabapentin and she feels well at the time. Discussed continuing current therapy and observation for worsening symptoms. [name] expressed understanding.

## 2019-11-18 ENCOUNTER — Other Ambulatory Visit: Payer: Self-pay | Admitting: Internal Medicine

## 2019-11-18 DIAGNOSIS — J309 Allergic rhinitis, unspecified: Secondary | ICD-10-CM

## 2019-11-19 ENCOUNTER — Ambulatory Visit: Payer: Medicare HMO | Admitting: Diagnostic Neuroimaging

## 2019-11-26 ENCOUNTER — Other Ambulatory Visit: Payer: Self-pay | Admitting: Internal Medicine

## 2019-11-26 DIAGNOSIS — E7800 Pure hypercholesterolemia, unspecified: Secondary | ICD-10-CM

## 2019-12-01 NOTE — Addendum Note (Signed)
Addended by: Hulan Fray on: 12/01/2019 11:33 AM   Modules accepted: Orders

## 2019-12-08 ENCOUNTER — Other Ambulatory Visit: Payer: Self-pay

## 2019-12-08 ENCOUNTER — Encounter: Payer: Self-pay | Admitting: Physical Therapy

## 2019-12-08 ENCOUNTER — Ambulatory Visit: Payer: Medicare HMO | Attending: Internal Medicine | Admitting: Physical Therapy

## 2019-12-08 VITALS — BP 155/82

## 2019-12-08 DIAGNOSIS — M542 Cervicalgia: Secondary | ICD-10-CM | POA: Diagnosis not present

## 2019-12-08 NOTE — Therapy (Signed)
[facility] [address] [place], [place], [address] Phone: [phone]   Fax:  [phone]  Physical Therapy Evaluation  Patient Details  Name: Michelle Hurley MRN: [id number] Date of Birth: 1950-08-14 Referring Provider (PT): Mosetta Anis, MD   Encounter Date: 12/08/2019   PT End of Session - 12/08/19 1557    Visit Number 1    Number of Visits 7    Date for PT Re-Evaluation 01/22/20    Authorization Type [employer]    PT Start Time 1320    PT Stop Time 1410    PT Time Calculation (min) 50 min    Activity Tolerance Patient tolerated treatment well    Behavior During Therapy [facility] for tasks assessed/performed           Past Medical History:  Diagnosis Date  . Abdominal pain 05/22/2014  . Allergic rhinitis 04/16/2015  . Anxiety state, unspecified   . ARTHRALGIA UNSPECIFIED SITE 12/04/2008   Qualifier: Diagnosis of  By: Melvyn Novas MD, Christena Deem   . Arthritis   . Atherosclerosis of abdominal aorta ([place]) 10/01/2015   Aortoiliac atherosclerosis noted on CT abdomen in 2016.  Marland Kitchen Atypical chest pain 12/11/2018  . Cough with congestion of paranasal sinus 06/01/2015  . Dizziness 04/06/2012  . Essential hypertension 12/24/2006  . Estrogen deficiency 11/16/2016  . GERD (gastroesophageal reflux disease) 08/07/2018  . H/O: hysterectomy   . Hx-TIA (transient ischemic attack) 04/06/2012  . Hypertension   . Hypokalemia 02/12/2018  . Influenza-like illness 02/02/2016  . Loss of part of visual field   . Morbid obesity ([place])   . Myopia   . Nausea with vomiting 05/23/2014  . Neuromyelitis optica ([place]) 01/22/2018  . NSVT (nonsustained ventricular tachycardia) ([place])    a. 01/2005 Echo: EF 55-65%  . Osteopenia   . Other and unspecified hyperlipidemia   . Palpitations   . Plantar fasciitis, bilateral 06/12/2016  . Rash 03/23/2017  . Right ankle injury, initial encounter 06/12/2016  . Right elbow pain 06/11/2017  . Right leg pain 09/06/2016    . Right optic neuritis 01/22/2018  . Sciatica 01/15/2018  . Shoulder pain, bilateral (Concern for CAD) 08/28/2018  . Shoulder pain, bilateral (Concern for CAD) 08/28/2018  . Umbilical hernia   . Unspecified essential hypertension   . Vaginal candidiasis 02/12/2018  . VENTRICULAR TACHYCARDIA 12/04/2008   F/u  [facility]    . Viral upper respiratory tract infection 01/15/2018  . Vitamin D deficiency 02/12/2018    Past Surgical History:  Procedure Laterality Date  . ABDOMINAL HYSTERECTOMY  1974  . ESOPHAGOGASTRODUODENOSCOPY N/A 05/28/2014   Procedure: ESOPHAGOGASTRODUODENOSCOPY (EGD);  Surgeon: Teena Irani, MD;  Location: [facility];  Service: Endoscopy;  Laterality: N/A;  . LAPAROSCOPIC SMALL BOWEL RESECTION N/A 06/04/2014   Procedure: LAPAROSCOPIC SMALL BOWEL RESECTION;  Surgeon: Fanny Skates, MD;  Location: [facility];  Service: General;  Laterality: N/A;  . LAPAROSCOPIC TRANSABDOMINAL HERNIA N/A 05/03/2014   Procedure: LAPAROSCOPIC EXPLORATION WITH OPEN REPAIR OF INCARCERATED EPIGASTRIC HERNIA ;  Surgeon: Alphonsa Overall, MD;  Location: [facility];  Service: General;  Laterality: N/A;    Vitals:   12/08/19 0175  BP: (!) 155/82      Subjective Assessment - 12/08/19 1324    Subjective Patient is a 69 y/o Female w/ PMH of neuromyelitis optica, obesity, HTN, GERD presenting to [facility] w/ complaint of back pain associated with new numbness. She was in her usual state of health until 2 month ago when she developed  worsening Right upper back pain associated with swelling. Pt received imaging and results were arthritis in upper cervical region and degenerative dx in lower spine. Since, her L shoulder/neck has started hurting as well, but only when she moves certain ways. Pt also has severe exzema where she breaks out and claws and bleeds.Excema was brought back on by the Prednisone given in hospital for the Neuromyelitis optica, pt reports almost going blind in R eye. Pt just randomly woke up with the thoracic  pain that she thought was gas pain, was taking gas-x. Pain came first, then the numbness. The numbness came back a few weeks ago. Pt states that driving and moving head with daily activities causes the pain in her neck/shoulder and sitting still relieves it. Pt's goals are to be able to get rid of pain and be able to drive with ease.    Patient Stated Goals Get rid of pain    Currently in Pain? Yes    Pain Location Neck    Pain Orientation Left    Pain Descriptors / Indicators Sore;Stabbing;Sharp    Pain Onset More than a month ago    Pain Frequency Intermittent    Aggravating Factors  Driving, moving head doing normal stuff    Pain Relieving Factors sit still, Gabapentin (has to take 3 for it to help any)              [facility] PT Assessment - 12/08/19 1342      Assessment   Medical Diagnosis Neuromyelitis optica    Referring Provider (PT) Mosetta Anis, MD    Onset Date/Surgical Date 10/10/19    Hand Dominance Right    Prior Therapy No      Precautions   Precautions None      Balance Screen   Has the patient fallen in the past 6 months No    Has the patient had a decrease in activity level because of a fear of falling?  No    Is the patient reluctant to leave their home because of a fear of falling?  No      Home Social worker Private residence    Living Arrangements Alone    Available Help at Discharge Family;Friend(s)    Type of Home Apartment   studio apt for [place] One level    [place] - single point      Prior Function   Level of Independence Independent    Vocation Retired    Engineer, civil (consulting), reading      Cognition   Overall Cognitive Status Within Functional Limits for tasks assessed      Observation/Other Assessments   Observations Reading glasses only      Sensation   Light Touch Appears Intact    Additional Comments Came back in early october      Coordination   Gross Motor Movements  are Fluid and Coordinated Yes    Fine Motor Movements are Fluid and Coordinated Yes   slower   Finger Nose Finger Test Normal      ROM / Strength   AROM / PROM / Strength AROM;PROM;Strength      AROM   Overall AROM  Within functional limits for tasks performed    AROM Assessment Site Shoulder      PROM   Overall PROM  Within functional limits for tasks performed      Strength   Overall  Strength Deficits    Strength Assessment Site Shoulder    Right/Left Shoulder Left;Right    Right Shoulder Flexion 4/5    Right Shoulder Extension 4+/5    Right Shoulder ABduction 4/5    Right Shoulder Internal Rotation 4/5    Right Shoulder External Rotation 4/5    Right Shoulder Horizontal ABduction 4/5    Left Shoulder Flexion 4/5    Left Shoulder Extension 4+/5    Left Shoulder ABduction 4/5    Left Shoulder Internal Rotation 4/5    Left Shoulder External Rotation 4/5    Left Shoulder Horizontal ABduction 4/5      Palpation   Palpation comment Trigger points in left UT and L rhomboids      Transfers   Transfers Sit to Stand;Stand to Sit;Sit to Supine;Supine to Sit    Sit to Stand 7: Independent    Stand to Sit 7: Independent    Supine to Sit 7: Independent    Sit to Supine 7: Independent                      Objective measurements completed on examination: See above findings.               PT Education - 12/08/19 1557    Education Details Pt educated on PT POC.    Person(s) Educated Patient    Methods Explanation    Comprehension Verbalized understanding            PT Short Term Goals - 12/08/19 1611      PT SHORT TERM GOAL #1   Title Pt will be independ with inital HEP.    Baseline NA    Time 3    Period Weeks    Status New    Target Date 01/07/20      PT SHORT TERM GOAL #2   Title Pt will achieve 65 degrees rotation bilaterally for safe driving.    Baseline Will assess cervical ROM.    Time 3    Period Weeks    Status New    Target  Date 01/07/20      PT SHORT TERM GOAL #3   Title Pt will exhibit less than 20% disability on NDI.    Baseline will assess NDI    Time 3    Period Weeks    Status New    Target Date 01/07/20             PT Long Term Goals - 12/08/19 1614      PT LONG TERM GOAL #1   Title Pt will be independ with progressive HEP.    Baseline NA    Time 6    Period Weeks    Status New    Target Date 01/22/20      PT LONG TERM GOAL #2   Title Pt will exhibit less than 10% disability on NDI.    Baseline NT    Time 6    Period Weeks    Status New    Target Date 01/22/20      PT LONG TERM GOAL #3   Title Pt will achieve >80 deg ROM bilaterally for safe driving.    Baseline NT    Time 6    Period Weeks    Status New    Target Date 01/22/20                  Plan - 12/08/19 1559  Clinical Impression Statement Patient is a 69 y/o Female w/ PMH of neuromyelitis optica, obesity, HTN, GERD presenting to [facility] w/ complaint of back pain along with L shoulder/neck pain. She was in her usual state of health until 2 month ago when she developed worsening right upper back pain associated with swelling. Now, most of her pain if on the L side in the interscapular region and UT region with palpable trigger points in the left UT and L rhomboids. Upon evaluation, pt presents with no numbness or tingling and all sensation intact for BUE and BLE. Pt presents with AROM, PROM, and strength WFL for bil shoulders. Vision and coordination also WNL. Pt states that driving and moving head with daily activities causes the pain in her neck/shoulder and sitting still relieves it. Pt's goals are to be able to get rid of pain and be able to drive with ease. Pt would benefit from skilled PT services in order to address trigger points and tightness/pain in L thoracic and neck/shoulder region for 1x/week x 6 weeks.    Personal Factors and Comorbidities Age;Time since onset of injury/illness/exacerbation;Social  Background;Past/Current Experience;Comorbidity 3+    Comorbidities neuromyelitis optica, obesity, HTN, GERD    Examination-Participation Restrictions Driving    Stability/Clinical Decision Making Stable/Uncomplicated    Clinical Decision Making Low    Rehab Potential Good    PT Frequency 1x / week    PT Duration 6 weeks    PT Treatment/Interventions Gait training;Functional mobility training;Stair training;Therapeutic activities;Therapeutic exercise;Balance training;Neuromuscular re-education;Manual techniques;Joint Manipulations;Moist Heat;Traction;Ultrasound;Aquatic Therapy;ADLs/Self Care Home Management;DME Instruction;Patient/family education;Passive range of motion;Other (comment);Cryotherapy;Dry needling;Spinal Manipulations   TDN   PT Next Visit Plan Assess cervical ROM, NDI, begin STM/TDN for trigger points    Consulted and Agree with Plan of Care Patient           Patient will benefit from skilled therapeutic intervention in order to improve the following deficits and impairments:  Pain, Decreased range of motion, Decreased mobility, Hypomobility, Improper body mechanics, Decreased activity tolerance  Visit Diagnosis: Cervicalgia     Problem List Patient Active Problem List   Diagnosis Date Noted  . Right arm numbness 10/20/2019  . Upper back pain on right side 09/25/2019  . Breast mass, left 04/11/2019  . Pruritic rash/Eczema 01/29/2019  . GERD (gastroesophageal reflux disease) 08/07/2018  . Vitamin D deficiency 02/12/2018  . 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 Estrogen deficiency 11/16/2016  . Plantar fasciitis, bilateral 06/12/2016  . Atherosclerosis of abdominal aorta ([place]) 10/01/2015  . Healthcare maintenance 04/16/2015  . Allergic rhinitis 04/16/2015  . Hx-TIA (transient ischemic attack) 04/06/2012  . VENTRICULAR TACHYCARDIA 12/04/2008  . HYPERLIPIDEMIA 12/24/2006  . OBESITY, MORBID 12/24/2006  . Essential hypertension 12/24/2006    Rosalita Levan,  SPT 12/08/2019, 4:18 PM  [facility] [address] [place], [place], [address] Phone: [phone]   Fax:  [phone]  Name: Michelle Hurley MRN: [id number] Date of Birth: [birth year]

## 2019-12-09 ENCOUNTER — Other Ambulatory Visit: Payer: Self-pay | Admitting: Internal Medicine

## 2019-12-09 DIAGNOSIS — M549 Dorsalgia, unspecified: Secondary | ICD-10-CM

## 2019-12-10 ENCOUNTER — Other Ambulatory Visit: Payer: Self-pay | Admitting: Internal Medicine

## 2019-12-10 DIAGNOSIS — M2560 Stiffness of unspecified joint, not elsewhere classified: Secondary | ICD-10-CM

## 2019-12-11 ENCOUNTER — Encounter: Payer: Self-pay | Admitting: Internal Medicine

## 2019-12-11 ENCOUNTER — Encounter: Payer: Self-pay | Admitting: *Deleted

## 2019-12-11 ENCOUNTER — Other Ambulatory Visit: Payer: Self-pay | Admitting: Internal Medicine

## 2019-12-11 DIAGNOSIS — J309 Allergic rhinitis, unspecified: Secondary | ICD-10-CM

## 2019-12-11 NOTE — Progress Notes (Unsigned)
Things That May Be Affecting Your Health:  Alcohol  Hearing loss  Pain    Depression  Home Safety  Sexual Health   Diabetes  Lack of physical activity  Stress   Difficulty with daily activities  Loneliness  Tiredness   Drug use  Medicines  Tobacco use   Falls  Motor Vehicle Safety  Weight   Food choices  Oral Health  Other    YOUR PERSONALIZED HEALTH PLAN : 1. Schedule your next subsequent Medicare Wellness visit in one year 2. Attend all of your regular appointments to address your medical issues 3. Complete the preventative screenings and services   Annual Wellness Visit   Medicare Covered Preventative Screenings and [facility] Men and Women Who How Often Need? Date of Last Service Action  Abdominal Aortic Aneurysm Adults with AAA risk factors Once     Alcohol Misuse and Counseling All Adults Screening once a year if no alcohol misuse. Counseling up to 4 face to face sessions.     Bone Density Measurement  Adults at risk for osteoporosis Once every 2 yrs X 12/28/2016   Lipid Panel Z13.6 All adults without CV disease Once every 5 yrs X 11/19/2014   Colorectal Cancer   Stool sample or  Colonoscopy All adults 78 and older   Once every year  Every 10 years     Depression All Adults Once a year  Today   Diabetes Screening Blood glucose, post glucose load, or GTT Z13.1  All adults at risk  Pre-diabetics  Once per year  Twice per year     Diabetes  Self-Management Training All adults Diabetics 10 hrs first year; 2 hours subsequent years. Requires Copay     Glaucoma  Diabetics  Family history of glaucoma  African Americans 44 yrs +  Hispanic Americans 70 yrs + Annually - requires coppay     Hepatitis C Z72.89 or F19.20  High Risk for HCV  Born between 1945 and 1965  Annually  Once     HIV Z11.4 All adults based on risk  Annually btw ages 45 & 6 regardless of risk  Annually > 65 yrs if at increased risk     Lung Cancer Screening  Asymptomatic adults aged 40-77 with 30 pack yr history and current smoker OR quit within the last 15 yrs Annually Must have counseling and shared decision making documentation before first screen     Medical Nutrition Therapy Adults with   Diabetes  Renal disease  Kidney transplant within past 3 yrs 3 hours first year; 2 hours subsequent years     Obesity and Counseling All adults Screening once a year Counseling if BMI 30 or higher  Today   Tobacco Use Counseling Adults who use tobacco  Up to 8 visits in one year     Vaccines Z23  Hepatitis B  Influenza   Pneumonia  Adults   Once  Once every flu season  Two different vaccines separated by one year     Next Annual Wellness Visit People with Medicare Every year  Today     Services & Screenings Women Who How Often Need  Date of Last Service Action  Mammogram  Z12.31 Women over 4 One baseline ages 6-39. Annually ager 27 yrs+  04/14/2019   Pap tests All women Annually if high risk. Every 2 yrs for normal risk women     Screening for cervical cancer with   Pap (Z01.419 nl or Z01.411abnl) &  HPV Z11.51 Women aged 11 to 88 Once every 5 yrs     Screening pelvic and breast exams All women Annually if high risk. Every 2 yrs for normal risk women     Sexually Transmitted Diseases  Chlamydia  Gonorrhea  Syphilis All at risk adults Annually for non pregnant females at increased risk         [place] Men Who How Ofter Need  Date of Last Service Action  Prostate Cancer - DRE & PSA Men over 50 Annually.  DRE might require a copay.     Sexually Transmitted Diseases  Syphilis All at risk adults Annually for men at increased risk

## 2019-12-11 NOTE — Progress Notes (Unsigned)

## 2019-12-15 ENCOUNTER — Ambulatory Visit: Payer: Medicare HMO | Attending: Internal Medicine | Admitting: Physical Therapy

## 2019-12-15 ENCOUNTER — Other Ambulatory Visit: Payer: Self-pay

## 2019-12-15 ENCOUNTER — Encounter: Payer: Self-pay | Admitting: Physical Therapy

## 2019-12-15 DIAGNOSIS — M542 Cervicalgia: Secondary | ICD-10-CM | POA: Insufficient documentation

## 2019-12-15 NOTE — Therapy (Signed)
[facility] [address] [place], [place], [address] Phone: [phone]   Fax:  [phone]  Physical Therapy Treatment  Patient Details  Name: Michelle Hurley MRN: [id number] Date of Birth: 07/25/1950 Referring Provider (PT): Theotis Barrio, MD   Encounter Date: 12/15/2019   PT End of Session - 12/15/19 1104    Visit Number 2    Number of Visits 7    Date for PT Re-Evaluation 01/22/20    Authorization Type [employer]    PT Start Time 1015    PT Stop Time 1100    PT Time Calculation (min) 45 min    Activity Tolerance Patient tolerated treatment well    Behavior During Therapy [facility] for tasks assessed/performed           Past Medical History:  Diagnosis Date  . Abdominal pain 05/22/2014  . Allergic rhinitis 04/16/2015  . Anxiety state, unspecified   . ARTHRALGIA UNSPECIFIED SITE 12/04/2008   Qualifier: Diagnosis of  By: Sherene Sires MD, Charlaine Dalton   . Arthritis   . Atherosclerosis of abdominal aorta (HCC) 10/01/2015   Aortoiliac atherosclerosis noted on CT abdomen in 2016.  Marland Kitchen Atypical chest pain 12/11/2018  . Cough with congestion of paranasal sinus 06/01/2015  . Dizziness 04/06/2012  . Essential hypertension 12/24/2006  . Estrogen deficiency 11/16/2016  . GERD (gastroesophageal reflux disease) 08/07/2018  . H/O: hysterectomy   . Hx-TIA (transient ischemic attack) 04/06/2012  . Hypertension   . Hypokalemia 02/12/2018  . Influenza-like illness 02/02/2016  . Loss of part of visual field   . Morbid obesity (HCC)   . Myopia   . Nausea with vomiting 05/23/2014  . Neuromyelitis optica (HCC) 01/22/2018  . NSVT (nonsustained ventricular tachycardia) (HCC)    a. 01/2005 Echo: EF 55-65%  . Osteopenia   . Other and unspecified hyperlipidemia   . Palpitations   . Plantar fasciitis, bilateral 06/12/2016  . Rash 03/23/2017  . Right ankle injury, initial encounter 06/12/2016  . Right elbow pain 06/11/2017  . Right leg pain 09/06/2016    . Right optic neuritis 01/22/2018  . Sciatica 01/15/2018  . Shoulder pain, bilateral (Concern for CAD) 08/28/2018  . Shoulder pain, bilateral (Concern for CAD) 08/28/2018  . Umbilical hernia   . Unspecified essential hypertension   . Vaginal candidiasis 02/12/2018  . VENTRICULAR TACHYCARDIA 12/04/2008   F/u  [facility]    . Viral upper respiratory tract infection 01/15/2018  . Vitamin D deficiency 02/12/2018    Past Surgical History:  Procedure Laterality Date  . ABDOMINAL HYSTERECTOMY  1974  . ESOPHAGOGASTRODUODENOSCOPY N/A 05/28/2014   Procedure: ESOPHAGOGASTRODUODENOSCOPY (EGD);  Surgeon: Dorena Cookey, MD;  Location: [facility];  Service: Endoscopy;  Laterality: N/A;  . LAPAROSCOPIC SMALL BOWEL RESECTION N/A 06/04/2014   Procedure: LAPAROSCOPIC SMALL BOWEL RESECTION;  Surgeon: Claud Kelp, MD;  Location: [facility];  Service: General;  Laterality: N/A;  . LAPAROSCOPIC TRANSABDOMINAL HERNIA N/A 05/03/2014   Procedure: LAPAROSCOPIC EXPLORATION WITH OPEN REPAIR OF INCARCERATED EPIGASTRIC HERNIA ;  Surgeon: Ovidio Kin, MD;  Location: [facility];  Service: General;  Laterality: N/A;    There were no vitals filed for this visit.   Subjective Assessment - 12/15/19 1015    Subjective Pt reports no issues since last Monday. Pt reports some pain/soreness after last Monday; however, she hasn't had any issues since then with her L neck/thoracic pain. Pt notes she is interested in exercises to maintain her strength as recommended by her doctor.  Patient Stated Goals Get rid of pain    Currently in Pain? No/denies    Pain Onset More than a month ago              [facility] PT Assessment - 12/15/19 0001      AROM   Cervical Flexion [facility]    Cervical Extension [facility]    Cervical - Right Side [place] [facility]    Cervical - Left [place] [facility]    Cervical - Right Rotation [facility]    Cervical - Left Rotation [facility]                         [facility] Adult PT Treatment/Exercise - 12/15/19 0001      Exercises    Exercises Shoulder;Neck      Neck Exercises: Seated   Neck Retraction 10 reps      Shoulder Exercises: Seated   Extension Strengthening;Both;20 reps;Theraband    Theraband Level (Shoulder Extension) Level 3 (Green)    Row Strengthening;Both;20 reps;Theraband    Theraband Level (Shoulder Row) Level 3 (Green)    Horizontal ABduction Strengthening;Both;20 reps;Theraband    Theraband Level (Shoulder Horizontal ABduction) Level 3 (Green)    External Rotation Strengthening;Both;20 reps    Theraband Level (Shoulder External Rotation) Level 3 (Green)      Neck Exercises: Stretches   Upper Trapezius Stretch 30 seconds;Right;Left    Levator Stretch Right;Left;30 seconds           Self trigger point release and massage with tennis ball bilateral periscapular region and thoracic paraspinals       PT Education - 12/15/19 1059    Education Details Discussed upper body/postural strengthening & POC. Discussed using tennis ball for self trigger point release/massage. Answered pt's questions in regards to dry needling.    Person(s) Educated Patient    Methods Explanation;Verbal cues;Handout    Comprehension Verbalized understanding;Verbal cues required            PT Short Term Goals - 12/08/19 1611      PT SHORT TERM GOAL #1   Title Pt will be independ with inital HEP.    Baseline NA    Time 3    Period Weeks    Status New    Target Date 01/07/20      PT SHORT TERM GOAL #2   Title Pt will achieve 65 degrees rotation bilaterally for safe driving.    Baseline Will assess cervical ROM.    Time 3    Period Weeks    Status New    Target Date 01/07/20      PT SHORT TERM GOAL #3   Title Pt will exhibit less than 20% disability on NDI.    Baseline will assess NDI    Time 3    Period Weeks    Status New    Target Date 01/07/20             PT Long Term Goals - 12/08/19 1614      PT LONG TERM GOAL #1   Title Pt will be independ with progressive HEP.    Baseline NA     Time 6    Period Weeks    Status New    Target Date 01/22/20      PT LONG TERM GOAL #2   Title Pt will exhibit less than 10% disability on NDI.    Baseline NT    Time 6    Period Weeks  Status New    Target Date 01/22/20      PT LONG TERM GOAL #3   Title Pt will achieve >80 deg ROM bilaterally for safe driving.    Baseline NT    Time 6    Period Weeks    Status New    Target Date 01/22/20                 Plan - 12/15/19 1100    Clinical Impression Statement Pt has had no issues in the past week with her neck/thoracic region -- therefore did not assess NDI. Pt with full cervical ROM. Upon palpation, pt had no significant trigger points today in upper traps with a few in her interscapular region. Addressed this with self massage/trigger point release with tennis ball. Treatment session thus focused on prevention; issued pt stretches and strengthening exercises. Discussed keeping current appointments for follow-up after issuing HEP; however, if she is doing well she can likely d/c.    Personal Factors and Comorbidities Age;Time since onset of injury/illness/exacerbation;Social Background;Past/Current Experience;Comorbidity 3+    Comorbidities neuromyelitis optica, obesity, HTN, GERD    Examination-Participation Restrictions Driving    Stability/Clinical Decision Making Stable/Uncomplicated    Rehab Potential Good    PT Frequency 1x / week    PT Duration 6 weeks    PT Treatment/Interventions Gait training;Functional mobility training;Stair training;Therapeutic activities;Therapeutic exercise;Balance training;Neuromuscular re-education;Manual techniques;Joint Manipulations;Moist Heat;Traction;Ultrasound;Aquatic Therapy;ADLs/Self Care Home Management;DME Instruction;Patient/family education;Passive range of motion;Other (comment);Cryotherapy;Dry needling;Spinal Manipulations   TDN   PT Next Visit Plan STM/TDN as needed for cervical & scapuar regions. Update HEP as needed.    PT  Home Exercise Plan Access Code: [email or web address] (pt had requested more exercises with tband)    Consulted and Agree with Plan of Care Patient           Patient will benefit from skilled therapeutic intervention in order to improve the following deficits and impairments:  Pain, Decreased range of motion, Decreased mobility, Hypomobility, Improper body mechanics, Decreased activity tolerance  Visit Diagnosis: Cervicalgia     Problem List Patient Active Problem List   Diagnosis Date Noted  . Right arm numbness 10/20/2019  . Upper back pain on right side 09/25/2019  . Breast mass, left 04/11/2019  . Pruritic rash/Eczema 01/29/2019  . GERD (gastroesophageal reflux disease) 08/07/2018  . Vitamin D deficiency 02/12/2018  . Neuromyelitis optica (HCC) 01/22/2018  . Estrogen deficiency 11/16/2016  . Plantar fasciitis, bilateral 06/12/2016  . Atherosclerosis of abdominal aorta (HCC) 10/01/2015  . Healthcare maintenance 04/16/2015  . Allergic rhinitis 04/16/2015  . Hx-TIA (transient ischemic attack) 04/06/2012  . VENTRICULAR TACHYCARDIA 12/04/2008  . HYPERLIPIDEMIA 12/24/2006  . OBESITY, MORBID 12/24/2006  . Essential hypertension 12/24/2006    [facility] April Ma L Lucky Alverson PT, DPT 12/15/2019, 12:02 PM  [facility] [address] [place], [place], [address] Phone: [phone]   Fax:  [phone]  Name: MARQUITA LEVITT MRN: [id number] Date of Birth: 1950-02-13

## 2019-12-22 ENCOUNTER — Encounter: Payer: Self-pay | Admitting: Physical Therapy

## 2019-12-22 ENCOUNTER — Other Ambulatory Visit: Payer: Self-pay

## 2019-12-22 ENCOUNTER — Ambulatory Visit: Payer: Medicare HMO | Admitting: Physical Therapy

## 2019-12-22 DIAGNOSIS — M542 Cervicalgia: Secondary | ICD-10-CM | POA: Diagnosis not present

## 2019-12-22 NOTE — Patient Instructions (Signed)
Access Code: [email or web address] URL: [email or web address] Date: 12/22/2019 Prepared by: Misty Stanley  Exercises Seated Levator Scapulae Stretch - 1 x daily - 7 x weekly - 2 sets - 20-30 secsec hold Seated Upper Trapezius Stretch - 1 x daily - 7 x weekly - 2 sets - 20-30 sec hold Standing Bilateral Low Shoulder Row with Anchored Resistance - 1 x daily - 7 x weekly - 2 sets - 10 reps Standing Cervical Retraction - 1 x daily - 7 x weekly - 2 sets - 10 reps Shoulder extension with resistance - Neutral - 1 x daily - 7 x weekly - 2 sets - 10 reps Supine Shoulder Horizontal Abduction with Resistance - 1 x daily - 7 x weekly - 2 sets - 10 reps Supine PNF D2 Flexion with Resistance - 1 x daily - 7 x weekly - 2 sets - 10 reps Supine Shoulder External Rotation with Resistance - 1 x daily - 7 x weekly - 2 sets - 10 reps

## 2019-12-23 NOTE — Therapy (Signed)
[facility] [address] [place], [place], [address] Phone: [phone]   Fax:  [phone]  Physical Therapy Treatment  Patient Details  Name: Michelle Hurley MRN: [id number] Date of Birth: Aug 04, 1950 Referring Provider (PT): Mosetta Anis, MD   Encounter Date: 12/22/2019   PT End of Session - 12/23/19 1155    Visit Number 3    Number of Visits 7    Date for PT Re-Evaluation 01/22/20    Authorization Type [employer]    PT Start Time 3474    PT Stop Time 1445    PT Time Calculation (min) 43 min    Activity Tolerance Patient tolerated treatment well    Behavior During Therapy [facility] for tasks assessed/performed           Past Medical History:  Diagnosis Date  . Abdominal pain 05/22/2014  . Allergic rhinitis 04/16/2015  . Anxiety state, unspecified   . ARTHRALGIA UNSPECIFIED SITE 12/04/2008   Qualifier: Diagnosis of  By: Melvyn Novas MD, Christena Deem   . Arthritis   . Atherosclerosis of abdominal aorta ([place]) 10/01/2015   Aortoiliac atherosclerosis noted on CT abdomen in 2016.  Marland Kitchen Atypical chest pain 12/11/2018  . Cough with congestion of paranasal sinus 06/01/2015  . Dizziness 04/06/2012  . Essential hypertension 12/24/2006  . Estrogen deficiency 11/16/2016  . GERD (gastroesophageal reflux disease) 08/07/2018  . H/O: hysterectomy   . Hx-TIA (transient ischemic attack) 04/06/2012  . Hypertension   . Hypokalemia 02/12/2018  . Influenza-like illness 02/02/2016  . Loss of part of visual field   . Morbid obesity ([place])   . Myopia   . Nausea with vomiting 05/23/2014  . Neuromyelitis optica ([place]) 01/22/2018  . NSVT (nonsustained ventricular tachycardia) ([place])    a. 01/2005 Echo: EF 55-65%  . Osteopenia   . Other and unspecified hyperlipidemia   . Palpitations   . Plantar fasciitis, bilateral 06/12/2016  . Rash 03/23/2017  . Right ankle injury, initial encounter 06/12/2016  . Right elbow pain 06/11/2017  . Right leg pain 09/06/2016   . Right optic neuritis 01/22/2018  . Sciatica 01/15/2018  . Shoulder pain, bilateral (Concern for CAD) 08/28/2018  . Shoulder pain, bilateral (Concern for CAD) 08/28/2018  . Umbilical hernia   . Unspecified essential hypertension   . Vaginal candidiasis 02/12/2018  . VENTRICULAR TACHYCARDIA 12/04/2008   F/u  [facility]    . Viral upper respiratory tract infection 01/15/2018  . Vitamin D deficiency 02/12/2018    Past Surgical History:  Procedure Laterality Date  . ABDOMINAL HYSTERECTOMY  1974  . ESOPHAGOGASTRODUODENOSCOPY N/A 05/28/2014   Procedure: ESOPHAGOGASTRODUODENOSCOPY (EGD);  Surgeon: Teena Irani, MD;  Location: [facility];  Service: Endoscopy;  Laterality: N/A;  . LAPAROSCOPIC SMALL BOWEL RESECTION N/A 06/04/2014   Procedure: LAPAROSCOPIC SMALL BOWEL RESECTION;  Surgeon: Fanny Skates, MD;  Location: [facility];  Service: General;  Laterality: N/A;  . LAPAROSCOPIC TRANSABDOMINAL HERNIA N/A 05/03/2014   Procedure: LAPAROSCOPIC EXPLORATION WITH OPEN REPAIR OF INCARCERATED EPIGASTRIC HERNIA ;  Surgeon: Alphonsa Overall, MD;  Location: [facility];  Service: General;  Laterality: N/A;    There were no vitals filed for this visit.   Subjective Assessment - 12/22/19 1402    Subjective Had a little pain in L shoulder after last session from exercises but it improved.  Wednesday had Booster vaccine and felt very achy afterwards.  Has continued her exercises but is still having a little pain in both shoulders/neck.    Patient Stated Goals  Get rid of pain    Currently in Pain? Yes    Pain Onset More than a month ago                             [facility] Adult PT Treatment/Exercise - 12/23/19 1146      Therapeutic Activites    Therapeutic Activities Other Therapeutic Activities    Other Therapeutic Activities Discussed plan to see pt for two more visits and if symptoms have resolved PT will D/C patient.  If symptoms remain, will consider TDN.      Exercises   Exercises Other Exercises     Other Exercises  Prior to starting HEP review performed neck and shoulder warm up consisting of bilateral and then unilateral shoulder rolls x 10 reps each, upper thoracic and cervical muscle stretch with chin to chest transitioning to upper trapezius stretch to L and R, mid back rounding stretch with UE in front, anterior chest stretch with UE reaching behind patient's back.  Guided pt through warm up exercises with verbal and visual cues.  Reviewed patient's HEP provided to patient last session.  Pt has been performing correctly but provided pt with recommendations to secure therband and prevent it from snapping and injuring patient.  Also monitored patient's form to ensure and minimize compensation with upper trap activation.   Adjusted shoulder horizontal ABD, horizontal ABD diagonal and shoulder ER changed pt to supine to provide pt with increased trunk and scapular stability due to inconsistent form and increased pain when performing in sitting.  Pt reported no pain in supine.           Access Code: [email or web address] URL: [email or web address] Date: 12/22/2019 Prepared by: Misty Stanley  Exercises Seated Levator Scapulae Stretch - 1 x daily - 7 x weekly - 2 sets - 20-30 secsec hold Seated Upper Trapezius Stretch - 1 x daily - 7 x weekly - 2 sets - 20-30 sec hold Standing Bilateral Low Shoulder Row with Anchored Resistance - 1 x daily - 7 x weekly - 2 sets - 10 reps Standing Cervical Retraction - 1 x daily - 7 x weekly - 2 sets - 10 reps Shoulder extension with resistance - Neutral - 1 x daily - 7 x weekly - 2 sets - 10 reps Supine Shoulder Horizontal Abduction with Resistance - 1 x daily - 7 x weekly - 2 sets - 10 reps Supine PNF D2 Flexion with Resistance - 1 x daily - 7 x weekly - 2 sets - 10 reps Supine Shoulder External Rotation with Resistance - 1 x daily - 7 x weekly - 2 sets - 10 reps      PT Education - 12/23/19 1155    Education Details updated HEP; plan for future  visits    Person(s) Educated Patient    Methods Explanation;Demonstration;Handout    Comprehension Verbalized understanding;Returned demonstration            PT Short Term Goals - 12/08/19 1611      PT SHORT TERM GOAL #1   Title Pt will be independ with inital HEP.    Baseline NA    Time 3    Period Weeks    Status New    Target Date 01/07/20      PT SHORT TERM GOAL #2   Title Pt will achieve 65 degrees rotation bilaterally for safe driving.    Baseline Will assess cervical ROM.    Time 3  Period Weeks    Status New    Target Date 01/07/20      PT SHORT TERM GOAL #3   Title Pt will exhibit less than 20% disability on NDI.    Baseline will assess NDI    Time 3    Period Weeks    Status New    Target Date 01/07/20             PT Long Term Goals - 12/08/19 1614      PT LONG TERM GOAL #1   Title Pt will be independ with progressive HEP.    Baseline NA    Time 6    Period Weeks    Status New    Target Date 01/22/20      PT LONG TERM GOAL #2   Title Pt will exhibit less than 10% disability on NDI.    Baseline NT    Time 6    Period Weeks    Status New    Target Date 01/22/20      PT LONG TERM GOAL #3   Title Pt will achieve >80 deg ROM bilaterally for safe driving.    Baseline NT    Time 6    Period Weeks    Status New    Target Date 01/22/20                 Plan - 12/23/19 1156    Clinical Impression Statement Pt continues to report overall improvement in symptoms but does report increase in pain in L shoulder with new HEP.  Reviewed and adjusted HEP to ensure proper form and to maximize benefits of exercises.  If pt continues to report improvement in symptoms, will plan to D/C.  If pt continues to experience increased tension and pain on L side, will consider TDN.  Pt agreeable to plan.    Personal Factors and Comorbidities Age;Time since onset of injury/illness/exacerbation;Social Background;Past/Current Experience;Comorbidity 3+     Comorbidities neuromyelitis optica, obesity, HTN, GERD    Examination-Participation Restrictions Driving    Stability/Clinical Decision Making Stable/Uncomplicated    Rehab Potential Good    PT Frequency 1x / week    PT Duration 6 weeks    PT Treatment/Interventions Gait training;Functional mobility training;Stair training;Therapeutic activities;Therapeutic exercise;Balance training;Neuromuscular re-education;Manual techniques;Joint Manipulations;Moist Heat;Traction;Ultrasound;Aquatic Therapy;ADLs/Self Care Home Management;DME Instruction;Patient/family education;Passive range of motion;Other (comment);Cryotherapy;Dry needling;Spinal Manipulations   TDN   PT Next Visit Plan April - she has been having some return of pain on L side with some of the exercises - I changed some to supine for more stability.  I told her we would see her two more sessions to see if the exercises are going better, if we can upgrade them back to sitting/standing.  If some symptoms remain she would be willing to try TDN on the third visit.    PT Home Exercise Plan Access Code: [email or web address] (pt had requested more exercises with tband)    Consulted and Agree with Plan of Care Patient           Patient will benefit from skilled therapeutic intervention in order to improve the following deficits and impairments:  Pain,Decreased range of motion,Decreased mobility,Hypomobility,Improper body mechanics,Decreased activity tolerance  Visit Diagnosis: Cervicalgia     Problem List Patient Active Problem List   Diagnosis Date Noted  . Right arm numbness 10/20/2019  . Upper back pain on right side 09/25/2019  . Breast mass, left 04/11/2019  . Pruritic rash/Eczema 01/29/2019  . GERD (gastroesophageal  reflux disease) 08/07/2018  . Vitamin D deficiency 02/12/2018  . Neuromyelitis optica ([place]) 01/22/2018  . Estrogen deficiency 11/16/2016  . Plantar fasciitis, bilateral 06/12/2016  . Atherosclerosis of abdominal aorta ([place])  10/01/2015  . Healthcare maintenance 04/16/2015  . Allergic rhinitis 04/16/2015  . Hx-TIA (transient ischemic attack) 04/06/2012  . VENTRICULAR TACHYCARDIA 12/04/2008  . HYPERLIPIDEMIA 12/24/2006  . OBESITY, MORBID 12/24/2006  . Essential hypertension 12/24/2006    Rico Junker, PT, DPT 12/23/19    12:02 PM    [facility] [address] [place], [place], [address] Phone: [phone]   Fax:  [phone]  Name: Michelle Hurley MRN: [id number] Date of Birth: [birth year]

## 2019-12-29 ENCOUNTER — Ambulatory Visit: Payer: Medicare HMO | Admitting: Physical Therapy

## 2019-12-31 ENCOUNTER — Other Ambulatory Visit: Payer: Self-pay | Admitting: Internal Medicine

## 2020-01-05 ENCOUNTER — Other Ambulatory Visit: Payer: Self-pay

## 2020-01-05 ENCOUNTER — Ambulatory Visit: Payer: Medicare HMO | Admitting: Physical Therapy

## 2020-01-05 ENCOUNTER — Other Ambulatory Visit: Payer: Self-pay | Admitting: Student

## 2020-01-05 DIAGNOSIS — J309 Allergic rhinitis, unspecified: Secondary | ICD-10-CM

## 2020-01-05 DIAGNOSIS — M542 Cervicalgia: Secondary | ICD-10-CM | POA: Diagnosis not present

## 2020-01-05 NOTE — Therapy (Signed)
[facility] [address] [place], [place], [address] Phone: [phone]   Fax:  [phone]  Physical Therapy Treatment and Discharge  Patient Details  Name: Michelle Hurley MRN: [id number] Date of Birth: [birth year] Referring Provider (PT): Theotis Barrio, MD   PHYSICAL THERAPY DISCHARGE SUMMARY  Visits from Start of Care: 4  Current functional level related to goals / functional outcomes: See below   Remaining deficits: See below   Education / Equipment: See below  Plan: Patient agrees to discharge.  Patient goals were met. Patient is being discharged due to meeting the stated rehab goals.  ?????        Encounter Date: 01/05/2020   PT End of Session - 01/05/20 1306    Visit Number 4    Number of Visits 7    Date for PT Re-Evaluation 01/22/20    Authorization Type [employer]    PT Start Time 1235    PT Stop Time 1300    PT Time Calculation (min) 25 min    Activity Tolerance Patient tolerated treatment well    Behavior During Therapy WFL for tasks assessed/performed           Past Medical History:  Diagnosis Date  . Abdominal pain 05/22/2014  . Allergic rhinitis 04/16/2015  . Anxiety state, unspecified   . ARTHRALGIA UNSPECIFIED SITE 12/04/2008   Qualifier: Diagnosis of  By: Sherene Sires MD, Charlaine Dalton   . Arthritis   . Atherosclerosis of abdominal aorta (HCC) 10/01/2015   Aortoiliac atherosclerosis noted on CT abdomen in 2016.  Marland Kitchen Atypical chest pain 12/11/2018  . Cough with congestion of paranasal sinus 06/01/2015  . Dizziness 04/06/2012  . Essential hypertension 12/24/2006  . Estrogen deficiency 11/16/2016  . GERD (gastroesophageal reflux disease) 08/07/2018  . H/O: hysterectomy   . Hx-TIA (transient ischemic attack) 04/06/2012  . Hypertension   . Hypokalemia 02/12/2018  . Influenza-like illness 02/02/2016  . Loss of part of visual field   . Morbid obesity (HCC)   . Myopia   . Nausea with vomiting  05/23/2014  . Neuromyelitis optica (HCC) 01/22/2018  . NSVT (nonsustained ventricular tachycardia) (HCC)    a. 01/2005 Echo: EF 55-65%  . Osteopenia   . Other and unspecified hyperlipidemia   . Palpitations   . Plantar fasciitis, bilateral 06/12/2016  . Rash 03/23/2017  . Right ankle injury, initial encounter 06/12/2016  . Right elbow pain 06/11/2017  . Right leg pain 09/06/2016  . Right optic neuritis 01/22/2018  . Sciatica 01/15/2018  . Shoulder pain, bilateral (Concern for CAD) 08/28/2018  . Shoulder pain, bilateral (Concern for CAD) 08/28/2018  . Umbilical hernia   . Unspecified essential hypertension   . Vaginal candidiasis 02/12/2018  . VENTRICULAR TACHYCARDIA 12/04/2008   F/u  [facility]    . Viral upper respiratory tract infection 01/15/2018  . Vitamin D deficiency 02/12/2018    Past Surgical History:  Procedure Laterality Date  . ABDOMINAL HYSTERECTOMY  1974  . ESOPHAGOGASTRODUODENOSCOPY N/A 05/28/2014   Procedure: ESOPHAGOGASTRODUODENOSCOPY (EGD);  Surgeon: Dorena Cookey, MD;  Location: [facility];  Service: Endoscopy;  Laterality: N/A;  . LAPAROSCOPIC SMALL BOWEL RESECTION N/A 06/04/2014   Procedure: LAPAROSCOPIC SMALL BOWEL RESECTION;  Surgeon: Claud Kelp, MD;  Location: [facility];  Service: General;  Laterality: N/A;  . LAPAROSCOPIC TRANSABDOMINAL HERNIA N/A 05/03/2014   Procedure: LAPAROSCOPIC EXPLORATION WITH OPEN REPAIR OF INCARCERATED EPIGASTRIC HERNIA ;  Surgeon: Ovidio Kin, MD;  Location: [facility];  Service: General;  Laterality:  N/A;    There were no vitals filed for this visit.   Subjective Assessment - 01/05/20 1241    Subjective Pt reports no issues. Pt states that she's upgraded her exercises into sitting at home. She states all the exercises have been going well.    Patient Stated Goals Get rid of pain    Currently in Pain? No/denies    Pain Onset More than a month ago              [facility] PT Assessment - 01/05/20 0001      Observation/Other Assessments   Neck  Disability Index  8%      AROM   Cervical Flexion WFL    Cervical Extension WFL    Cervical - Right Side Bend WFL    Cervical - Left Side Bend WFL    Cervical - Right Rotation WFL    Cervical - Left Rotation [facility]      Strength   Right Shoulder Flexion 5/5    Right Shoulder Extension 5/5    Right Shoulder ABduction 5/5    Right Shoulder Internal Rotation 4+/5    Right Shoulder External Rotation 4+/5    Right Shoulder Horizontal ABduction 5/5    Right Shoulder Horizontal ADduction 5/5    Left Shoulder Flexion 5/5    Left Shoulder Extension 5/5    Left Shoulder ABduction 5/5    Left Shoulder Internal Rotation 4+/5    Left Shoulder External Rotation 4+/5    Left Shoulder Horizontal ABduction 5/5    Left Shoulder Horizontal ADduction 5/5               Self massage with theracane x1 min                  PT Education - 01/05/20 1306    Education Details Discussed self manual massage with cane, discussed upgrading her HEP, discussed calling clinic if any further issues.    Person(s) Educated Patient    Methods Explanation    Comprehension Verbalized understanding            PT Short Term Goals - 01/05/20 1307      PT SHORT TERM GOAL #1   Title Pt will be independ with inital HEP.    Baseline NA    Time 3    Period Weeks    Status Achieved    Target Date 01/07/20      PT SHORT TERM GOAL #2   Title Pt will achieve 65 degrees rotation bilaterally for safe driving.    Baseline Will assess cervical ROM.    Time 3    Period Weeks    Status Achieved    Target Date 01/07/20      PT SHORT TERM GOAL #3   Title Pt will exhibit less than 20% disability on NDI.    Baseline will assess NDI    Time 3    Period Weeks    Status Achieved    Target Date 01/07/20             PT Long Term Goals - 01/05/20 1301      PT LONG TERM GOAL #1   Title Pt will be independ with progressive HEP.    Baseline NA    Time 6    Period Weeks    Status Achieved       PT LONG TERM GOAL #2   Title Pt will exhibit less than 10% disability on NDI.  Baseline NDI 8% -- Reports she believes she can lift heavy weights without pain but has avoided it/not tried it due to concerns of risk of injury    Time 6    Period Weeks    Status Achieved      PT LONG TERM GOAL #3   Title Pt will achieve >80 deg ROM bilaterally for safe driving.    Baseline Symmetrical and WFL    Time 6    Period Weeks    Status Achieved                 Plan - 01/05/20 1303    Clinical Impression Statement Pt with overall improvement. She states the pain can come and go but it is manageable and she is performing her exercises already in a seated position. Discussed using cane for self massage as well as the tennis ball. Rechecked pt's goals -- pt with full neck ROM and has overall improved shoulder strength. Pt is reporting no pain at this time; NDI score is 1%. Pt states she is good and is ready for PT d/c.    Personal Factors and Comorbidities Age;Time since onset of injury/illness/exacerbation;Social Background;Past/Current Experience;Comorbidity 3+    Comorbidities neuromyelitis optica, obesity, HTN, GERD    Examination-Participation Restrictions Driving    Stability/Clinical Decision Making Stable/Uncomplicated    Rehab Potential Good    PT Frequency 1x / week    PT Duration 6 weeks    PT Treatment/Interventions Gait training;Functional mobility training;Stair training;Therapeutic activities;Therapeutic exercise;Balance training;Neuromuscular re-education;Manual techniques;Joint Manipulations;Moist Heat;Traction;Ultrasound;Aquatic Therapy;ADLs/Self Care Home Management;DME Instruction;Patient/family education;Passive range of motion;Other (comment);Cryotherapy;Dry needling;Spinal Manipulations   TDN   PT Next Visit Plan April - she has been having some return of pain on L side with some of the exercises - I changed some to supine for more stability.  I told her we would see  her two more sessions to see if the exercises are going better, if we can upgrade them back to sitting/standing.  If some symptoms remain she would be willing to try TDN on the third visit.    PT Home Exercise Plan Access Code: [email or web address] (pt had requested more exercises with tband)    Consulted and Agree with Plan of Care Patient           Patient will benefit from skilled therapeutic intervention in order to improve the following deficits and impairments:  Pain,Decreased range of motion,Decreased mobility,Hypomobility,Improper body mechanics,Decreased activity tolerance  Visit Diagnosis: Cervicalgia     Problem List Patient Active Problem List   Diagnosis Date Noted  . Right arm numbness 10/20/2019  . Upper back pain on right side 09/25/2019  . Breast mass, left 04/11/2019  . Pruritic rash/Eczema 01/29/2019  . GERD (gastroesophageal reflux disease) 08/07/2018  . Vitamin D deficiency 02/12/2018  . Neuromyelitis optica (HCC) 01/22/2018  . Estrogen deficiency 11/16/2016  . Plantar fasciitis, bilateral 06/12/2016  . Atherosclerosis of abdominal aorta (HCC) 10/01/2015  . Healthcare maintenance 04/16/2015  . Allergic rhinitis 04/16/2015  . Hx-TIA (transient ischemic attack) 04/06/2012  . VENTRICULAR TACHYCARDIA 12/04/2008  . HYPERLIPIDEMIA 12/24/2006  . OBESITY, MORBID 12/24/2006  . Essential hypertension 12/24/2006    [facility] April Ma L Santa Abdelrahman PT, DPT 01/05/2020, 1:09 PM  [facility] [address] [place], [place], [address] Phone: [phone]   Fax:  [phone]  Name: Michelle Hurley MRN: [id number] Date of Birth: 02/07/1950

## 2020-01-07 ENCOUNTER — Encounter: Payer: Self-pay | Admitting: Internal Medicine

## 2020-01-07 ENCOUNTER — Other Ambulatory Visit: Payer: Self-pay

## 2020-01-07 ENCOUNTER — Ambulatory Visit (INDEPENDENT_AMBULATORY_CARE_PROVIDER_SITE_OTHER): Payer: Medicare HMO | Admitting: Internal Medicine

## 2020-01-07 VITALS — BP 136/81 | HR 66 | Temp 98.4°F | Wt 209.7 lb

## 2020-01-07 DIAGNOSIS — L282 Other prurigo: Secondary | ICD-10-CM | POA: Diagnosis not present

## 2020-01-07 NOTE — Patient Instructions (Signed)
Dear [name],  Thank you for allowing [name] to provide your care today. Today we discussed your rash    Today we made no changes to your medications:    Please follow-up as needed.    Please call the internal medicine center clinic if you have any questions or concerns, we may be able to help and keep you from a long and expensive emergency room wait. Our clinic and after hours phone number is [phone], the best time to call is Monday through Friday 9 am to 4 pm but there is always someone available 24/7 if you have an emergency. If you need medication refills please notify your pharmacy one week in advance and they will send [name] a request.    If you have not gotten the COVID vaccine, I recommend doing so:  You may get it at your local [facility] or [facility] OR To schedule an appointment for a COVID vaccine or be added to the vaccine wait list: Go to [email or web address]   OR Go to [email or web address]                  OR Call [phone]                                     OR Call [phone] and select Option 2  Thank you for choosing [facility]   Atopic Dermatitis Atopic dermatitis is a skin disorder that causes inflammation of the skin. This is the most common type of eczema. Eczema is a group of skin conditions that cause the skin to be itchy, red, and swollen. This condition is generally worse during the cooler winter months and often improves during the warm summer months. Symptoms can vary from person to person. Atopic dermatitis usually starts showing signs in infancy and can last through adulthood. This condition cannot be passed from one person to another (non-contagious), but it is more common in families. Atopic dermatitis may not always be present. When it is present, it is called a flare-up. What are the causes? The exact cause of this condition is not known. Flare-ups of the condition may be triggered by:  Contact with something that you are  sensitive or allergic to.  Stress.  Certain foods.  Extremely hot or cold weather.  Harsh chemicals and soaps.  Dry air.  Chlorine. What increases the risk? This condition is more likely to develop in people who have a personal history or family history of eczema, allergies, asthma, or hay fever. What are the signs or symptoms? Symptoms of this condition include:  Dry, scaly skin.  Red, itchy rash.  Itchiness, which can be severe. This may occur before the skin rash. This can make sleeping difficult.  Skin thickening and cracking that can occur over time. How is this diagnosed? This condition is diagnosed based on your symptoms, a medical history, and a physical exam. How is this treated? There is no cure for this condition, but symptoms can usually be controlled. Treatment focuses on:  Controlling the itchiness and scratching. You may be given medicines, such as antihistamines or steroid creams.  Limiting exposure to things that you are sensitive or allergic to (allergens).  Recognizing situations that cause stress and developing a plan to manage stress. If your atopic dermatitis does not get better with medicines, or if it is all over your body (widespread), a treatment using a specific type of light (  phototherapy) may be used. Follow these instructions at home: Skin care   Keep your skin well-moisturized. Doing this seals in moisture and helps to prevent dryness. ? Use unscented lotions that have petroleum in them. ? Avoid lotions that contain alcohol or water. They can dry the skin.  Keep baths or showers short (less than 5 minutes) in warm water. Do not use hot water. ? Use mild, unscented cleansers for bathing. Avoid soap and bubble bath. ? Apply a moisturizer to your skin right after a bath or shower.  Do not apply anything to your skin without checking with your health care provider. General instructions  Dress in clothes made of cotton or cotton blends.  Dress lightly because heat increases itchiness.  When washing your clothes, rinse your clothes twice so all of the soap is removed.  Avoid any triggers that can cause a flare-up.  Try to manage your stress.  Keep your fingernails cut short.  Avoid scratching. Scratching makes the rash and itchiness worse. It may also result in a skin infection (impetigo) due to a break in the skin caused by scratching.  Take or apply over-the-counter and prescription medicines only as told by your health care provider.  Keep all follow-up visits as told by your health care provider. This is important.  Do not be around people who have cold sores or fever blisters. If you get the infection, it may cause your atopic dermatitis to worsen. Contact a health care provider if:  Your itchiness interferes with sleep.  Your rash gets worse or it is not better within one week of starting treatment.  You have a fever.  You have a rash flare-up after having contact with someone who has cold sores or fever blisters. Get help right away if:  You develop pus or soft yellow scabs in the rash area. Summary  This condition causes a red rash and itchy, dry, scaly skin.  Treatment focuses on controlling the itchiness and scratching, limiting exposure to things that you are sensitive or allergic to (allergens), recognizing situations that cause stress, and developing a plan to manage stress.  Keep your skin well-moisturized.  Keep baths or showers shorter than 5 minutes and use warm water. Do not use hot water. This information is not intended to replace advice given to you by your health care provider. Make sure you discuss any questions you have with your health care provider. Document Revised: 04/16/2018 Document Reviewed: 01/28/2016 Elsevier Patient Education  [place].

## 2020-01-07 NOTE — Progress Notes (Signed)
CC: Rash  HPI: Michelle Hurley is a 69 y.o. with PMH listed below presenting with complaint of rash. Please see problem based assessment and plan for further details.  Past Medical History:  Diagnosis Date  . Abdominal pain 05/22/2014  . Allergic rhinitis 04/16/2015  . Anxiety state, unspecified   . ARTHRALGIA UNSPECIFIED SITE 12/04/2008   Qualifier: Diagnosis of  By: Melvyn Novas MD, Christena Deem   . Arthritis   . Atherosclerosis of abdominal aorta ([place]) 10/01/2015   Aortoiliac atherosclerosis noted on CT abdomen in 2016.  Marland Kitchen Atypical chest pain 12/11/2018  . Cough with congestion of paranasal sinus 06/01/2015  . Dizziness 04/06/2012  . Essential hypertension 12/24/2006  . Estrogen deficiency 11/16/2016  . GERD (gastroesophageal reflux disease) 08/07/2018  . H/O: hysterectomy   . Hx-TIA (transient ischemic attack) 04/06/2012  . Hypertension   . Hypokalemia 02/12/2018  . Influenza-like illness 02/02/2016  . Loss of part of visual field   . Morbid obesity ([place])   . Myopia   . Nausea with vomiting 05/23/2014  . Neuromyelitis optica ([place]) 01/22/2018  . NSVT (nonsustained ventricular tachycardia) ([place])    a. 01/2005 Echo: EF 55-65%  . Osteopenia   . Other and unspecified hyperlipidemia   . Palpitations   . Plantar fasciitis, bilateral 06/12/2016  . Rash 03/23/2017  . Right ankle injury, initial encounter 06/12/2016  . Right elbow pain 06/11/2017  . Right leg pain 09/06/2016  . Right optic neuritis 01/22/2018  . Sciatica 01/15/2018  . Shoulder pain, bilateral (Concern for CAD) 08/28/2018  . Shoulder pain, bilateral (Concern for CAD) 08/28/2018  . Umbilical hernia   . Unspecified essential hypertension   . Vaginal candidiasis 02/12/2018  . VENTRICULAR TACHYCARDIA 12/04/2008   F/u  [facility]    . Viral upper respiratory tract infection 01/15/2018  . Vitamin D deficiency 02/12/2018    Review of Systems: Review of Systems  Constitutional: Negative for chills, fever and malaise/fatigue.  Eyes: Negative  for blurred vision.  Respiratory: Negative for shortness of breath.   Cardiovascular: Negative for chest pain and leg swelling.  Gastrointestinal: Negative for constipation, diarrhea, nausea and vomiting.  Genitourinary: Negative for dysuria.  Musculoskeletal: Positive for neck pain. Negative for back pain, joint pain and myalgias.  Skin: Positive for itching and rash.  Neurological: Negative for dizziness.  All other systems reviewed and are negative.    Physical Exam: Vitals:   01/07/20 1541  BP: 136/81  Pulse: 66  Temp: 98.4 F (36.9 C)  TempSrc: Oral  SpO2: 99%  Weight: 209 lb 11.2 oz (95.1 kg)   Gen: Well-developed, well nourished, NAD HEENT: NCAT head, hearing intact CV: RRR, S1, S2 normal Pulm: CTAB, No rales, no wheezes Extm: ROM intact, Peripheral pulses intact, No peripheral edema Skin: Dry, Warm, normal turgor, multiple scratch marks with patchy erythema on RUE  Assessment & Plan:   Pruritic rash/Eczema [name] is a 69 yo F w/ PMH of neuromyelitis optica, GERD, HTN, HLD presenting with complain of itchy arm. She mentions that she has a history of eczema and has been following with [place] [facility]. She mentions having no relief with variety of cream and ointments prescribed there and had started Dupixent injections for treatment. She expresses frustration after treatment with 6 injections so far and seeing no obvious improvement in her symptoms. She also express frustration in her current dermatology office's availability and mentions being in the process of looking for a different dermatology office.  A/P Present for continued management for  atopic dermatitis. Diagnosed and treated with [facility] with high potency topical steroids and Dupixent injections. Patient requesting referral for second opinion due to lack of improvement.  - C/w current treatment until able to f/u with dermatology office - Referral placed for dermatology    Patient  discussed with Dr. Criselda Peaches  -Michelle Hurley, PGY3 [facility] Internal Medicine Pager: [phone]

## 2020-01-07 NOTE — Assessment & Plan Note (Addendum)
Michelle Hurley is a 69 yo F w/ PMH of neuromyelitis optica, GERD, HTN, HLD presenting with complain of itchy arm. She mentions that she has a history of eczema and has been following with [facility]. She mentions having no relief with variety of cream and ointments prescribed there and had started Dupixent injections for treatment. She expresses frustration after treatment with 6 injections so far and seeing no obvious improvement in her symptoms. She also express frustration in her current dermatology office's availability and mentions being in the process of looking for a different dermatology office.  A/P Present for continued management for atopic dermatitis. Diagnosed and treated with [facility] with high potency topical steroids and Dupixent injections. Patient requesting referral for second opinion due to lack of improvement.  - C/w current treatment until able to f/u with dermatology office - Referral placed for dermatology

## 2020-01-12 ENCOUNTER — Ambulatory Visit: Payer: Medicare HMO | Admitting: Physical Therapy

## 2020-01-13 NOTE — Progress Notes (Signed)
Internal Medicine Clinic Attending  Case discussed with Dr. Lee  At the time of the visit.  We reviewed the resident's history and exam and pertinent patient test results.  I agree with the assessment, diagnosis, and plan of care documented in the resident's note.    

## 2020-01-19 ENCOUNTER — Ambulatory Visit: Payer: Medicare HMO | Admitting: Physical Therapy

## 2020-01-20 ENCOUNTER — Other Ambulatory Visit: Payer: Self-pay | Admitting: Physician Assistant

## 2020-01-22 ENCOUNTER — Telehealth: Payer: Self-pay

## 2020-01-22 NOTE — Telephone Encounter (Signed)
Fax received from [facility] stating that the patient's Dupixent is approved through 01/08/2021.

## 2020-01-28 ENCOUNTER — Other Ambulatory Visit: Payer: Self-pay | Admitting: Internal Medicine

## 2020-01-28 DIAGNOSIS — L2089 Other atopic dermatitis: Secondary | ICD-10-CM | POA: Diagnosis not present

## 2020-01-28 DIAGNOSIS — J309 Allergic rhinitis, unspecified: Secondary | ICD-10-CM

## 2020-02-09 ENCOUNTER — Telehealth: Payer: Self-pay | Admitting: Internal Medicine

## 2020-02-09 NOTE — Telephone Encounter (Signed)
Pt informed that she can receive the vaccine at her local pharmacy-will have pcp/team send in rx for shingrix if appropriate.Despina Hidden Cassady1/31/20222:54 PM

## 2020-02-09 NOTE — Telephone Encounter (Signed)
Pt requesting a call back as to where she can get a Shingles Shot at.  Please call patient back.

## 2020-02-10 ENCOUNTER — Other Ambulatory Visit: Payer: Self-pay | Admitting: Student

## 2020-02-10 DIAGNOSIS — H469 Unspecified optic neuritis: Secondary | ICD-10-CM | POA: Diagnosis not present

## 2020-02-16 ENCOUNTER — Other Ambulatory Visit: Payer: Self-pay | Admitting: Student

## 2020-02-16 DIAGNOSIS — E7800 Pure hypercholesterolemia, unspecified: Secondary | ICD-10-CM

## 2020-02-20 ENCOUNTER — Other Ambulatory Visit: Payer: Self-pay | Admitting: Internal Medicine

## 2020-02-20 DIAGNOSIS — J309 Allergic rhinitis, unspecified: Secondary | ICD-10-CM

## 2020-02-23 DIAGNOSIS — H469 Unspecified optic neuritis: Secondary | ICD-10-CM | POA: Diagnosis not present

## 2020-02-25 ENCOUNTER — Encounter: Payer: Self-pay | Admitting: Internal Medicine

## 2020-02-25 ENCOUNTER — Ambulatory Visit (INDEPENDENT_AMBULATORY_CARE_PROVIDER_SITE_OTHER): Payer: Medicare HMO | Admitting: Internal Medicine

## 2020-02-25 VITALS — BP 145/91 | HR 65 | Temp 98.0°F | Wt 213.7 lb

## 2020-02-25 DIAGNOSIS — R5383 Other fatigue: Secondary | ICD-10-CM

## 2020-02-25 DIAGNOSIS — E2839 Other primary ovarian failure: Secondary | ICD-10-CM

## 2020-02-25 DIAGNOSIS — E559 Vitamin D deficiency, unspecified: Secondary | ICD-10-CM

## 2020-02-25 DIAGNOSIS — L282 Other prurigo: Secondary | ICD-10-CM | POA: Diagnosis not present

## 2020-02-25 DIAGNOSIS — Z0000 Encounter for general adult medical examination without abnormal findings: Secondary | ICD-10-CM

## 2020-02-25 DIAGNOSIS — I10 Essential (primary) hypertension: Secondary | ICD-10-CM

## 2020-02-25 HISTORY — DX: Other fatigue: R53.83

## 2020-02-25 NOTE — Assessment & Plan Note (Signed)
#  Vitamin D deficiency: Vitamin D level of 4.6 in January 2020.  Completed treatment with vitamin D 50,000 units once weekly for 6 weeks.  Last DEXA scan was December 2018 and was unremarkable with T score -0.4  Plan: -Follow-up vitamin D level (We will send prescription based on results) -Follow-up DEXA scan

## 2020-02-25 NOTE — Assessment & Plan Note (Signed)
#  Health maintenance: Prescription for shingles written.  Pending records from [facility]

## 2020-02-25 NOTE — Assessment & Plan Note (Signed)
#  Morning fatigue: She states that for the first time today, she felt tired after waking up.  It took her a while to get her energy back.  She has been trying to exercise by walking around her building daily.  She states that sometime last week, she fell asleep on normally.  Typically when she wakes up in the morning, she would read or play games on her phone.  Her BMI is 39.09 and she has gained about 4 pounds since December.  She denies snoring though she states that it is highly possible.  On review of her labs, hemoglobin stable in May 2021, TSH unremarkable in 2012, vitamin B12 normal in 2020  Suspicion for OSA is high given her resistant hypertension and morning fatigue.  Plan: -Refer for sleep study.

## 2020-02-25 NOTE — Assessment & Plan Note (Signed)
#  Eczema/pruritic rash: She has been able to follow-up with a new dermatologist (Dr. Rinaldo Ratel MD) and her symptoms have subsided with cimetidine and hydroxyzine.  Previously, her symptoms did not improve with high-dose topical steroids and Dupixent.   She also tells me that she believe that she had bedbugs at her apartment and she has been able to have this treated. Her apartment management has routine check-in's.

## 2020-02-25 NOTE — Progress Notes (Signed)
   CC: Follow up HTN  HPI:  [name] is a 70 y.o. with medical history significant for hypertension, neuromyelitis optica, eczema presenting for follow-up.  Please see problem based charting for further details.   Past Medical History:  Diagnosis Date  . Abdominal pain 05/22/2014  . Allergic rhinitis 04/16/2015  . Anxiety state, unspecified   . ARTHRALGIA UNSPECIFIED SITE 12/04/2008   Qualifier: Diagnosis of  By: Melvyn Novas MD, Christena Deem   . Arthritis   . Atherosclerosis of abdominal aorta ([place]) 10/01/2015   Aortoiliac atherosclerosis noted on CT abdomen in 2016.  Marland Kitchen Atypical chest pain 12/11/2018  . Cough with congestion of paranasal sinus 06/01/2015  . Dizziness 04/06/2012  . Essential hypertension 12/24/2006  . Estrogen deficiency 11/16/2016  . GERD (gastroesophageal reflux disease) 08/07/2018  . H/O: hysterectomy   . Hx-TIA (transient ischemic attack) 04/06/2012  . Hypertension   . Hypokalemia 02/12/2018  . Influenza-like illness 02/02/2016  . Loss of part of visual field   . Morbid obesity ([place])   . Myopia   . Nausea with vomiting 05/23/2014  . Neuromyelitis optica ([place]) 01/22/2018  . NSVT (nonsustained ventricular tachycardia) ([place])    a. 01/2005 Echo: EF 55-65%  . Osteopenia   . Other and unspecified hyperlipidemia   . Palpitations   . Plantar fasciitis, bilateral 06/12/2016  . Plantar fasciitis, bilateral 06/12/2016  . Rash 03/23/2017  . Right ankle injury, initial encounter 06/12/2016  . Right elbow pain 06/11/2017  . Right leg pain 09/06/2016  . Right optic neuritis 01/22/2018  . Sciatica 01/15/2018  . Shoulder pain, bilateral (Concern for CAD) 08/28/2018  . Shoulder pain, bilateral (Concern for CAD) 08/28/2018  . Umbilical hernia   . Unspecified essential hypertension   . Vaginal candidiasis 02/12/2018  . VENTRICULAR TACHYCARDIA 12/04/2008   F/u  [facility]    . Viral upper respiratory tract infection 01/15/2018  . Vitamin D deficiency 02/12/2018   Review of Systems:  As per  HPI  Physical Exam:  Vitals:   02/25/20 0933 02/25/20 0950  BP: (!) 159/90 (!) 145/91  Pulse: 70 65  Temp: 98 F (36.7 C)   TempSrc: Oral   SpO2: 98%   Weight: 213 lb 11.2 oz (96.9 kg)    Physical Exam Vitals and nursing note reviewed.  Constitutional:      Appearance: She is obese.  Cardiovascular:     Rate and Rhythm: Normal rate.     Heart sounds: Normal heart sounds. No murmur heard.   Pulmonary:     Breath sounds: Normal breath sounds. No wheezing.  Neurological:     Mental Status: She is alert.  Psychiatric:        Mood and Affect: Mood normal.        Behavior: Behavior normal.     Assessment & Plan:   See Encounters Tab for problem based charting.  Patient discussed with Dr. Daryll Drown

## 2020-02-25 NOTE — Patient Instructions (Addendum)
[name],   Thanks for seeing me today.  Here my recommendations after our visit.  I am going to get a blood work, bone scan and also refer you for the sleep study.  Please start taking over-the-counter calcium and vitamin D.  Take care! Dr. Eileen Stanford  Please call the internal medicine center clinic if you have any questions or concerns, we may be able to help and keep you from a long and expensive emergency room wait. Our clinic and after hours phone number is [phone], the best time to call is Monday through Friday 9 am to 4 pm but there is always someone available 24/7 if you have an emergency. If you need medication refills please notify your pharmacy one week in advance and they will send [name] a request.   If you have not gotten the COVID vaccine, I recommend doing so:  You may get it at your local [facility] or [facility] OR To schedule an appointment for a COVID vaccine or be added to the vaccine wait list: Go to [email or web address]   OR Go to [email or web address]                  OR Call [phone]                                     OR Call [phone] and select Option 2

## 2020-02-25 NOTE — Assessment & Plan Note (Signed)
#  Hypertension: Goal less than 130/80  BP Readings from Last 3 Encounters:  02/25/20 (!) 145/91  01/07/20 136/81  12/08/19 (!) 155/82    Plan: -Coreg 25 mg twice daily -Spironolactone 25 mg daily (plan to increase to 50 mg daily following BMP results) -Verapamil 60 mg 3 times daily -Lasix 40 mg daily

## 2020-02-26 ENCOUNTER — Other Ambulatory Visit: Payer: Self-pay | Admitting: Internal Medicine

## 2020-02-26 DIAGNOSIS — E559 Vitamin D deficiency, unspecified: Secondary | ICD-10-CM

## 2020-02-26 LAB — BMP8+ANION GAP
Anion Gap: 19 mmol/L — ABNORMAL HIGH (ref 10.0–18.0)
BUN/Creatinine Ratio: 16 (ref 12–28)
BUN: 20 mg/dL (ref 8–27)
CO2: 21 mmol/L (ref 20–29)
Calcium: 10 mg/dL (ref 8.7–10.3)
Chloride: 99 mmol/L (ref 96–106)
Creatinine, Ser: 1.26 mg/dL — ABNORMAL HIGH (ref 0.57–1.00)
GFR calc Af Amer: 50 mL/min/{1.73_m2} — ABNORMAL LOW (ref >59)
GFR calc non Af Amer: 43 mL/min/{1.73_m2} — ABNORMAL LOW (ref >59)
Glucose: 289 mg/dL — ABNORMAL HIGH (ref 65–99)
Potassium: 4.2 mmol/L (ref 3.5–5.2)
Sodium: 139 mmol/L (ref 134–144)

## 2020-02-26 LAB — VITAMIN D 25 HYDROXY (VIT D DEFICIENCY, FRACTURES): Vit D, 25-Hydroxy: 20.2 ng/mL — ABNORMAL LOW (ref 30.0–100.0)

## 2020-02-26 MED ORDER — CALCIUM CITRATE-VITAMIN D 250-100 MG-UNIT PO TABS: 1.0000 | ORAL_TABLET | Freq: Two times a day (BID) | ORAL | 3 refills | Status: AC

## 2020-03-03 NOTE — Progress Notes (Signed)
Internal Medicine Clinic Attending  Case discussed with Dr. Agyei  At the time of the visit.  We reviewed the resident's history and exam and pertinent patient test results.  I agree with the assessment, diagnosis, and plan of care documented in the resident's note.  

## 2020-03-09 DIAGNOSIS — H25811 Combined forms of age-related cataract, right eye: Secondary | ICD-10-CM | POA: Diagnosis not present

## 2020-03-09 DIAGNOSIS — H43823 Vitreomacular adhesion, bilateral: Secondary | ICD-10-CM | POA: Diagnosis not present

## 2020-03-09 DIAGNOSIS — H469 Unspecified optic neuritis: Secondary | ICD-10-CM | POA: Diagnosis not present

## 2020-03-09 DIAGNOSIS — H2512 Age-related nuclear cataract, left eye: Secondary | ICD-10-CM | POA: Diagnosis not present

## 2020-03-15 ENCOUNTER — Encounter: Payer: Self-pay | Admitting: *Deleted

## 2020-03-15 ENCOUNTER — Telehealth: Payer: Self-pay | Admitting: Diagnostic Neuroimaging

## 2020-03-15 MED ORDER — GABAPENTIN 300 MG PO CAPS: 300.0000 mg | ORAL_CAPSULE | Freq: Two times a day (BID) | ORAL | 11 refills | Status: DC

## 2020-03-15 NOTE — Telephone Encounter (Signed)
Pt called and left VM wanting to discuss with RN her medications. Please advise.

## 2020-03-15 NOTE — Telephone Encounter (Signed)
Called patient who stated she's taking gabapentin 300 mg twice a day for relief of back pain, once in the morning an then at bedtime.  It eases pain so she can function. She's asking for a new Rx for higher dose, sent to [facility]. She said Dr Leta Baptist had told her she could take up to 3 pills at a time. I advised will send her request to him. Patient verbalized understanding, appreciation.

## 2020-03-15 NOTE — Telephone Encounter (Signed)
Meds ordered this encounter  Medications  . gabapentin (NEURONTIN) 300 MG capsule    Sig: Take 1 capsule (300 mg total) by mouth 2 (two) times daily.    Dispense:  60 capsule    Refill:  [place], MD 08/12/938, 7:68 PM Certified in Neurology, Neurophysiology and [facility] [address], [place], [place] [address] [phone]

## 2020-03-17 ENCOUNTER — Other Ambulatory Visit: Payer: Self-pay | Admitting: Internal Medicine

## 2020-03-17 DIAGNOSIS — J309 Allergic rhinitis, unspecified: Secondary | ICD-10-CM

## 2020-03-24 ENCOUNTER — Ambulatory Visit (INDEPENDENT_AMBULATORY_CARE_PROVIDER_SITE_OTHER): Payer: Medicare HMO | Admitting: Neurology

## 2020-03-24 ENCOUNTER — Encounter: Payer: Self-pay | Admitting: Neurology

## 2020-03-24 VITALS — BP 149/91 | HR 69 | Ht 62.0 in | Wt 216.0 lb

## 2020-03-24 DIAGNOSIS — G479 Sleep disorder, unspecified: Secondary | ICD-10-CM | POA: Diagnosis not present

## 2020-03-24 DIAGNOSIS — G4719 Other hypersomnia: Secondary | ICD-10-CM | POA: Diagnosis not present

## 2020-03-24 DIAGNOSIS — Z820 Family history of epilepsy and other diseases of the nervous system: Secondary | ICD-10-CM | POA: Diagnosis not present

## 2020-03-24 DIAGNOSIS — Z9189 Other specified personal risk factors, not elsewhere classified: Secondary | ICD-10-CM | POA: Diagnosis not present

## 2020-03-24 DIAGNOSIS — R0683 Snoring: Secondary | ICD-10-CM | POA: Diagnosis not present

## 2020-03-24 DIAGNOSIS — R519 Headache, unspecified: Secondary | ICD-10-CM | POA: Diagnosis not present

## 2020-03-24 DIAGNOSIS — E669 Obesity, unspecified: Secondary | ICD-10-CM | POA: Diagnosis not present

## 2020-03-24 NOTE — Progress Notes (Signed)
Subjective:    [name] is a 70 y.o. female.  HPI     Star Age, MD, PhD [facility] [address], [address] [place], [place] [address]  Dear Drs. Marisa Sprinkles,   I saw your patient, Michelle Hurley, upon your kind request in my sleep clinic today for initial consultation of her sleep disorder, in particular, concern for underlying obstructive sleep apnea.  The patient is unaccompanied today. As you know, Michelle Hurley is a 70 year old right-handed woman with an underlying complex medical history of hypertension, palpitations and history of an SVT, dizziness, reflux disease, history of TIA and neuromyelitis optica (followed by her primary neurologist, Dr. Leta Baptist), arthritis, allergic rhinitis, anxiety, sciatica, vitamin D deficiency, low back pain with sciatica on the right, and obesity, who reports snoring and excessive daytime somnolence.  She reports sleep disturbance, does not wake up fully rested, she is restless at night and occasionally has flareup in her right-sided sciatica.  I reviewed your office note from 02/25/2020.  Her Epworth sleepiness score is 4 out of 24, fatigue severity score is 33 out of 63.  She lives alone.  She is working on weight loss and tries to exercise at the gym.  She goes to bed generally between 1130 and 12:30 AM, rise time is without an alarm around 7:15 AM.  She does not have night to night nocturia but has had morning headaches occasionally.  She has 2 sisters who had sleep apnea.  She is not sure they both use her machine any longer.  She has no pets in the household.  She has a TV in the bedroom but does not watch it at night.  She is a non-smoker and drinks alcohol rarely, no day-to-day caffeine.  She does not typically take a daily nap but occasionally dozes off in the recliner.  Her Past Medical History Is Significant For: Past Medical History:  Diagnosis Date  . Abdominal pain 05/22/2014  .  Allergic rhinitis 04/16/2015  . Anxiety state, unspecified   . ARTHRALGIA UNSPECIFIED SITE 12/04/2008   Qualifier: Diagnosis of  By: Melvyn Novas MD, Christena Deem   . Arthritis   . Atherosclerosis of abdominal aorta ([place]) 10/01/2015   Aortoiliac atherosclerosis noted on CT abdomen in 2016.  Marland Kitchen Atypical chest pain 12/11/2018  . Cough with congestion of paranasal sinus 06/01/2015  . Dizziness 04/06/2012  . Essential hypertension 12/24/2006  . Estrogen deficiency 11/16/2016  . GERD (gastroesophageal reflux disease) 08/07/2018  . H/O: hysterectomy   . Hx-TIA (transient ischemic attack) 04/06/2012  . Hypertension   . Hypokalemia 02/12/2018  . Influenza-like illness 02/02/2016  . Loss of part of visual field   . Morbid obesity ([place])   . Myopia   . Nausea with vomiting 05/23/2014  . Neuromyelitis optica ([place]) 01/22/2018  . NSVT (nonsustained ventricular tachycardia) ([place])    a. 01/2005 Echo: EF 55-65%  . Osteopenia   . Other and unspecified hyperlipidemia   . Palpitations   . Plantar fasciitis, bilateral 06/12/2016  . Plantar fasciitis, bilateral 06/12/2016  . Rash 03/23/2017  . Right ankle injury, initial encounter 06/12/2016  . Right elbow pain 06/11/2017  . Right leg pain 09/06/2016  . Right optic neuritis 01/22/2018  . Sciatica 01/15/2018  . Shoulder pain, bilateral (Concern for CAD) 08/28/2018  . Shoulder pain, bilateral (Concern for CAD) 08/28/2018  . Umbilical hernia   . Unspecified essential hypertension   . Vaginal candidiasis 02/12/2018  . VENTRICULAR TACHYCARDIA 12/04/2008  F/u  [facility]    . Viral upper respiratory tract infection 01/15/2018  . Vitamin D deficiency 02/12/2018    Her Past Surgical History Is Significant For: Past Surgical History:  Procedure Laterality Date  . ABDOMINAL HYSTERECTOMY  1974  . ESOPHAGOGASTRODUODENOSCOPY N/A 05/28/2014   Procedure: ESOPHAGOGASTRODUODENOSCOPY (EGD);  Surgeon: Teena Irani, MD;  Location: [facility];  Service: Endoscopy;  Laterality: N/A;  . LAPAROSCOPIC  SMALL BOWEL RESECTION N/A 06/04/2014   Procedure: LAPAROSCOPIC SMALL BOWEL RESECTION;  Surgeon: Fanny Skates, MD;  Location: [facility];  Service: General;  Laterality: N/A;  . LAPAROSCOPIC TRANSABDOMINAL HERNIA N/A 05/03/2014   Procedure: LAPAROSCOPIC EXPLORATION WITH OPEN REPAIR OF INCARCERATED EPIGASTRIC HERNIA ;  Surgeon: Alphonsa Overall, MD;  Location: [facility];  Service: General;  Laterality: N/A;    Her Family History Is Significant For: Family History  Problem Relation Age of Onset  . Heart disease Mother   . Diabetes Mother   . Lung cancer Mother   . Thyroid cancer Sister   . Diabetes Sister   . Hypertension Brother   . Heart attack Brother   . Stroke Sister   . Breast cancer Neg Hx     Her Social History Is Significant For: Social History   Socioeconomic History  . Marital status: Divorced    Spouse name: Not on file  . Number of children: Not on file  . Years of education: college  . Highest education level: Not on file  Occupational History  . Occupation: Retired  Tobacco Use  . Smoking status: Never Smoker  . Smokeless tobacco: Never Used  Vaping Use  . Vaping Use: Never used  Substance and Sexual Activity  . Alcohol use: No    Alcohol/week: 0.0 standard drinks  . Drug use: No  . Sexual activity: Not on file  Other Topics Concern  . Not on file  Social History Narrative   Lives home alone.  Divorced x 2.  College.  Medical office assistant hx.         Social Determinants of Health   Financial Resource Strain: Not on file  Food Insecurity: Not on file  Transportation Needs: Not on file  Physical Activity: Not on file  Stress: Not on file  Social Connections: Not on file    Her Allergies Are:  Allergies  Allergen Reactions  . Mushroom Extract Complex Anaphylaxis, Itching and Swelling    Swelling all over body   . Codeine Nausea And Vomiting  . Hydrocodone Nausea And Vomiting  . Prednisone Nausea And Vomiting  . Omeprazole Itching  :   Her Current  Medications Are:  Outpatient Encounter Medications as of 03/24/2020  Medication Sig  . calcium-vitamin D 250-100 MG-UNIT tablet Take 1 tablet by mouth 2 (two) times daily.  . carvedilol (COREG) 25 MG tablet TAKE 1 TABLET TWICE DAILY WITH MEALS  . cyclobenzaprine (FLEXERIL) 5 MG tablet Take 1 tablet (5 mg total) by mouth 3 (three) times daily as needed for muscle spasms.  . dupilumab (DUPIXENT) 300 MG/2ML prefilled syringe Inject 300 mg into the skin every 14 (fourteen) days. Starting at day 15 for maintenance.  . DUPIXENT 300 MG/2ML SOPN INJECT 300MG  (1 PEN) SUBCUTANEOUSLY EVERY 2 WEEKS AS DIRECTED.  . furosemide (LASIX) 40 MG tablet TAKE 1 TABLET EVERY DAY  . gabapentin (NEURONTIN) 300 MG capsule Take 1 capsule (300 mg total) by mouth 2 (two) times daily.  Derrill Memo ON 03/25/2020] levocetirizine (XYZAL) 5 MG tablet Take 1 tablet (5 mg total)  by mouth every evening.  . lovastatin (MEVACOR) 20 MG tablet TAKE 1 TABLET AT BEDTIME  . polyethylene glycol powder (GLYCOLAX/MIRALAX) powder Take 17 g by mouth daily as needed for moderate constipation or severe constipation.  Marland Kitchen RITUXAN 500 MG/50ML injection 05/14/18 not started yet  . spironolactone (ALDACTONE) 25 MG tablet TAKE 1 TABLET EVERY DAY  . verapamil (CALAN) 120 MG tablet TAKE 1/2 TABLET THREE TIMES DAILY  . [DISCONTINUED] Clobetasol Prop Emollient Base (CLOBETASOL PROPIONATE E) 0.05 % emollient cream Apply 1 application topically 2 (two) times daily. (Patient not taking: Reported on 12/08/2019)  . [DISCONTINUED] pimecrolimus (ELIDEL) 1 % cream Apply topically 2 (two) times daily. (Patient not taking: No sig reported)  . [DISCONTINUED] triamcinolone cream (KENALOG) 0.1 % Apply to affected area after bathing   No facility-administered encounter medications on file as of 03/24/2020.  :   Review of Systems:  Out of a complete 14 point review of systems, all are reviewed and negative with the exception of these symptoms as listed below:  Review of  Systems  Neurological:       Here for sleep consult. No prior sleep study, reports she does not sleep well. Epworth Sleepiness Scale 0= would never doze 1= slight chance of dozing 2= moderate chance of dozing 3= high chance of dozing  Sitting and reading:3 Watching TV:0 Sitting inactive in a public place (ex. Theater or meeting):0 As a passenger in a car for an hour without a break:1 Lying down to rest in the afternoon:0 Sitting and talking to someone:0 Sitting quietly after lunch (no alcohol):0 In a car, while stopped in traffic:0 Total:4     Objective:  Neurological Exam  Physical Exam Physical Examination:   Vitals:   03/24/20 1331  BP: (!) 149/91  Pulse: 69    General Examination: The patient is a very pleasant 70 y.o. female in no acute distress. She appears well-developed and well-nourished and well groomed.   HEENT: Normocephalic, atraumatic, pupils are equal, round and reactive to light, extraocular tracking is good without limitation to gaze excursion or nystagmus noted. Hearing is grossly intact. Face is symmetric with normal facial animation. Speech is clear with no dysarthria noted. There is no hypophonia. There is no lip, neck/head, jaw or voice tremor. Neck is supple with full range of passive and active motion. There are no carotid bruits on auscultation. Oropharynx exam reveals: mild mouth dryness, adequate dental hygiene with partial plate on top and several missing teeth on the bottom, cannot tolerate her partial dentures on the bottom she states.  She has moderate airway crowding secondary to small airway entry and redundant soft palate.  Tongue protrudes centrally and palate elevates symmetrically, Mallampati class III.  Neck circumference of 16 three-quarter inches.    Chest: Clear to auscultation without wheezing, rhonchi or crackles noted.  Heart: S1+S2+0, regular and normal without murmurs, rubs or gallops noted.   Abdomen: Soft, non-tender and  non-distended with normal bowel sounds appreciated on auscultation.  Extremities: There is 1+ pitting edema in the distal lower extremities bilaterally.   Skin: Warm and dry without trophic changes noted.   Musculoskeletal: exam reveals no obvious joint deformities, tenderness or joint swelling or erythema.   Neurologically:  Mental status: The patient is awake, alert and oriented in all 4 spheres. Her immediate and remote memory, attention, language skills and fund of knowledge are appropriate. There is no evidence of aphasia, agnosia, apraxia or anomia. Speech is clear with normal prosody and enunciation. Thought process is  linear. Mood is normal and affect is normal.  Cranial nerves II - XII are as described above under HEENT exam.  Motor exam: Normal bulk, strength and tone is noted. There is no tremor, fine motor skills and coordination: grossly intact.  Cerebellar testing: No dysmetria or intention tremor. There is no truncal or gait ataxia.  Sensory exam: intact to light touch in the upper and lower extremities.  Gait, station and balance: She stands with mild difficulty, posture is age-appropriate but she does stand slightly wider base.  She walks slowly and cautiously.  She has no walking aid.  Preserved arm swing noted, no shuffling.    Assessment and Plan:  In summary, Michelle Hurley is a very pleasant 70 y.o.-year old female with an underlying complex medical history of hypertension, palpitations and history of an SVT, dizziness, reflux disease, history of TIA and neuromyelitis optica (followed by her primary neurologist, Dr. Leta Baptist), arthritis, allergic rhinitis, anxiety, sciatica, vitamin D deficiency, low back pain with sciatica on the right, and obesity, whose history and physical exam are concerning for obstructive sleep apnea (OSA). I had a long chat with the patient about my findings and the diagnosis of OSA, its prognosis and treatment options. We talked about medical  treatments, surgical interventions and non-pharmacological approaches. I explained in particular the risks and ramifications of untreated moderate to severe OSA, especially with respect to developing cardiovascular disease down the Road, including congestive heart failure, difficult to treat hypertension, cardiac arrhythmias, or stroke. Even type 2 diabetes has, in part, been linked to untreated OSA. Symptoms of untreated OSA include daytime sleepiness, memory problems, mood irritability and mood disorder such as depression and anxiety, lack of energy, as well as recurrent headaches, especially morning headaches. We talked about trying to maintain a healthy lifestyle in general, as well as the importance of weight control. We also talked about the importance of good sleep hygiene. I recommended the following at this time: sleep study.   I explained the sleep test procedure to the patient and also outlined possible surgical and non-surgical treatment options of OSA, including the use of a custom-made dental device (which would require a referral to a specialist dentist or oral surgeon), upper airway surgical options, such as traditional UPPP or a novel less invasive surgical option in the form of Inspire hypoglossal nerve stimulation (which would involve a referral to an ENT surgeon). I also explained the CPAP treatment option to the patient, who indicated that she would be willing to try CPAP if the need arises. I explained the importance of being compliant with PAP treatment, not only for insurance purposes but primarily to improve Her symptoms, and for the patient's long term health benefit, including to reduce Her cardiovascular risks. I answered all her questions today and the patient was in agreement. I plan to see her back after the sleep study is completed and encouraged her to call with any interim questions, concerns, problems or updates.   Thank you very much for allowing me to participate in the  care of this nice patient. If I can be of any further assistance to you please do not hesitate to call me at [phone].  Sincerely,   Star Age, MD, PhD

## 2020-03-24 NOTE — Patient Instructions (Signed)
It was nice to meet you today.  Here is what we discussed today and what we came up with as our plan for you:    Based on your symptoms and your exam I believe you are at risk for obstructive sleep apnea (aka OSA), and I think we should proceed with a sleep study to determine whether you do or do not have OSA and how severe it is. Even, if you have mild OSA, I may want you to consider treatment with CPAP, as treatment of even borderline or mild sleep apnea can result and improvement of symptoms such as sleep disruption, daytime sleepiness, nighttime bathroom breaks, restless leg symptoms, improvement of headache syndromes, even improved mood disorder.   As explained, an attended sleep study meaning you get to stay overnight in the sleep lab, lets us monitor sleep-related behaviors such as sleep talking and leg movements in sleep, in addition to monitoring for sleep apnea.  A home sleep test is a screening tool for sleep apnea only, and unfortunately does not help with any other sleep-related diagnoses.  Please remember, the long-term risks and ramifications of untreated moderate to severe obstructive sleep apnea are: increased Cardiovascular disease, including congestive heart failure, stroke, difficult to control hypertension, treatment resistant obesity, arrhythmias, especially irregular heartbeat commonly known as A. Fib. (atrial fibrillation); even type 2 diabetes has been linked to untreated OSA.   Sleep apnea can cause disruption of sleep and sleep deprivation in most cases, which, in turn, can cause recurrent headaches, problems with memory, mood, concentration, focus, and vigilance. Most people with untreated sleep apnea report excessive daytime sleepiness, which can affect their ability to drive. Please do not drive if you feel sleepy. Patients with sleep apnea can also develop difficulty initiating and maintaining sleep (aka insomnia).   Having sleep apnea may increase your risk for other sleep  disorders, including involuntary behaviors sleep such as sleep terrors, sleep talking, sleepwalking.    Having sleep apnea can also increase your risk for restless leg syndrome and leg movements at night.   Please note that untreated obstructive sleep apnea may carry additional perioperative morbidity. Patients with significant obstructive sleep apnea (typically, in the moderate to severe degree) should receive, if possible, perioperative PAP (positive airway pressure) therapy and the surgeons and particularly the anesthesiologists should be informed of the diagnosis and the severity of the sleep disordered breathing.   I will likely see you back after your sleep study to go over the test results and where to go from there. We will call you after your sleep study to advise about the results (most likely, you will hear from Megan, my nurse) and to set up an appointment at the time, as necessary.    Our sleep lab administrative assistant will call you to schedule your sleep study and give you further instructions, regarding the check in process for the sleep study, arrival time, what to bring, when you can expect to leave after the study, etc., and to answer any other logistical questions you may have. If you don't hear back from her by about 2 weeks from now, please feel free to call her direct line at [phone] or you can call our general clinic number, or email us through My Chart.    

## 2020-03-25 ENCOUNTER — Telehealth: Payer: Self-pay

## 2020-03-25 NOTE — Telephone Encounter (Signed)
Requesting to speak with a nurse about ankle/face swelling. Please call pt back.

## 2020-03-25 NOTE — Telephone Encounter (Signed)
Return pt's call - stated she went for the Sleep Study and the person told her she's swollen. The pt stated she knows her legs are swollen; she keeps them up all the time when she's at home. I asked about any facial swelling; pt stated she looks the same; no difference; "maybe phat :) ". No c/o pain. And stated she takes her medications are prescribed. No available appts tomorrow (Friday). Per front office, pt did not want to on Monday ; appt scheduled Tues 3/22@ [id number] with Dr Alfonse Spruce.

## 2020-03-25 NOTE — Telephone Encounter (Signed)
Thanks Glenda! 

## 2020-03-30 ENCOUNTER — Other Ambulatory Visit: Payer: Self-pay

## 2020-03-30 ENCOUNTER — Ambulatory Visit (INDEPENDENT_AMBULATORY_CARE_PROVIDER_SITE_OTHER): Payer: Medicare HMO | Admitting: Student

## 2020-03-30 ENCOUNTER — Encounter: Payer: Self-pay | Admitting: Student

## 2020-03-30 VITALS — BP 117/99 | HR 68 | Temp 97.8°F | Ht 62.0 in | Wt 217.7 lb

## 2020-03-30 DIAGNOSIS — R601 Generalized edema: Secondary | ICD-10-CM | POA: Insufficient documentation

## 2020-03-30 DIAGNOSIS — I10 Essential (primary) hypertension: Secondary | ICD-10-CM

## 2020-03-30 NOTE — Assessment & Plan Note (Signed)
Initial blood pressure 143/83.  Recheck 117/99.  No adjustment in her medication today.  We will recheck next visit  -Continue Coreg 25 mg twice daily -Continue verapamil 60 mg 3 times daily -Continue spironolactone 25 mg twice daily

## 2020-03-30 NOTE — Assessment & Plan Note (Addendum)
Patient presented to clinic for swelling of her legs, arms and face, that was notified by a sleep study tech.  Patient went to get her sleep study done and was told that her face and arm looked swollen.  Patient states that she has had lower extremity edema last year.  She also noticed recent swelling of her hands, states that her hands feel so tight that she could not form a fist.  She did not notice anything about facial swelling.  Today she states that her swelling has improved, especially in her hands. Physical exam remarkable for +1 lower extremity edema.  No hand, wrist or facial swelling observed.  Assessment and plan Differentials for anasarca include nephrotic syndrome vs hepatic related vs heart failure related vs Dupixent related.  We will check CMP and urine protein/creatinine ratio to rule out nephrotic syndrome and liver issue. '  Patient has an echocardiogram done in 10/2018 which showed EF 55-60% with moderate left hypertrophy.  Given the absence of dyspnea, orthopnea, JVD or crackles on lung auscultation, low suspicion for heart failure or cor pulmonale.  Dupixent can have side effect of swelling.  Patient was started on Dupixent last year around the same time due to breakout of eczema.  Her skin exam is unremarkable now.  If nephrotic syndrome and liver issue were ruled out, would advise patient to speak to her dermatologist about [place].  Patient is taking verapamil, which can also cause lower extremity swelling.   -Pending CMP  -Pending urine protein/creatinine ratio  Addendum  CMP unremarkable.  Creatinine improved slightly with normal LFTs.  Urine protein/creatinine ratio is not within the nephrotic range.  Nephrotic syndrome and liver disease were ruled out.  Patient has seen her dermatologist recently and stopped Dupixent.  Hopefully her anasarca will resolved with stopping Dupixent.

## 2020-03-30 NOTE — Patient Instructions (Addendum)
Ms. Rost,  It is a pleasure seeing you in the clinic today.  I will order blood work and urine test to evaluate for any kidney or liver issue that can cause your swelling.  Please continue taking the Lasix and you can also use a compression stocking.  Take care  Dr. Alfonse Spruce

## 2020-03-30 NOTE — Progress Notes (Signed)
   CC: Acute anasarca  HPI:  [name] is a 70 y.o. with past medical history of hypertension, hyperlipidemia, history of SVT who presents to clinic for an acute anasarca.  Please see problem based charting for further detailed  Past Medical History:  Diagnosis Date  . Abdominal pain 05/22/2014  . Allergic rhinitis 04/16/2015  . Anxiety state, unspecified   . ARTHRALGIA UNSPECIFIED SITE 12/04/2008   Qualifier: Diagnosis of  By: Melvyn Novas MD, Christena Deem   . Arthritis   . Atherosclerosis of abdominal aorta ([place]) 10/01/2015   Aortoiliac atherosclerosis noted on CT abdomen in 2016.  [place] Kitchen Atypical chest pain 12/11/2018  . Cough with congestion of paranasal sinus 06/01/2015  . Dizziness 04/06/2012  . Essential hypertension 12/24/2006  . Estrogen deficiency 11/16/2016  . GERD (gastroesophageal reflux disease) 08/07/2018  . H/O: hysterectomy   . Hx-TIA (transient ischemic attack) 04/06/2012  . Hypertension   . Hypokalemia 02/12/2018  . Influenza-like illness 02/02/2016  . Loss of part of visual field   . Morbid obesity ([place])   . Myopia   . Nausea with vomiting 05/23/2014  . 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 NSVT (nonsustained ventricular tachycardia) ([place])    a. 01/2005 Echo: EF 55-65%  . Osteopenia   . Other and unspecified hyperlipidemia   . Palpitations   . Plantar fasciitis, bilateral 06/12/2016  . Plantar fasciitis, bilateral 06/12/2016  . Rash 03/23/2017  . Right ankle injury, initial encounter 06/12/2016  . Right elbow pain 06/11/2017  . Right leg pain 09/06/2016  . Right optic neuritis 01/22/2018  . Sciatica 01/15/2018  . Shoulder pain, bilateral (Concern for CAD) 08/28/2018  . Shoulder pain, bilateral (Concern for CAD) 08/28/2018  . Umbilical hernia   . Unspecified essential hypertension   . Vaginal candidiasis 02/12/2018  . VENTRICULAR TACHYCARDIA 12/04/2008   F/u  [facility] [place]    . Viral upper respiratory tract infection 01/15/2018  . Vitamin D deficiency 02/12/2018   Review of  Systems: As per HPI  Physical Exam:  Vitals:   03/30/20 0903 03/30/20 0953  BP: (!) 143/83 (!) 117/99  Pulse: 62 68  Temp: 97.8 F (36.6 C)   TempSrc: Oral   SpO2: 96%   Weight: 217 lb 11.2 oz (98.7 kg)   Height: 5\' 2"  (1.575 m)    Physical Exam Constitutional:      General: She is not in acute distress. HENT:     Head: Normocephalic.  Eyes:     General:        Right eye: No discharge.        Left eye: No discharge.  Cardiovascular:     Rate and Rhythm: Normal rate and regular rhythm.     Comments: No JVD Pulmonary:     Effort: Pulmonary effort is normal. No respiratory distress.     Comments: No crackles Musculoskeletal:     Right lower leg: Edema (+1) present.     Left lower leg: Edema (+1) present.     Comments: No wrist or hand edema No facial edema noted  Skin:    General: Skin is warm.  Neurological:     Mental Status: She is alert.  Psychiatric:        Mood and Affect: Mood normal.     Assessment & Plan:   See Encounters Tab for problem based charting.  Patient discussed with Dr. Evette Doffing

## 2020-03-31 DIAGNOSIS — L2089 Other atopic dermatitis: Secondary | ICD-10-CM | POA: Diagnosis not present

## 2020-03-31 LAB — CMP14 + ANION GAP
ALT: 28 IU/L (ref 0–32)
AST: 23 IU/L (ref 0–40)
Albumin/Globulin Ratio: 1.5 (ref 1.2–2.2)
Albumin: 4.1 g/dL (ref 3.7–4.7)
Alkaline Phosphatase: 70 IU/L (ref 44–121)
Anion Gap: 18 mmol/L (ref 10.0–18.0)
BUN/Creatinine Ratio: 12 (ref 12–28)
BUN: 13 mg/dL (ref 8–27)
Bilirubin Total: 0.2 mg/dL (ref 0.0–1.2)
CO2: 19 mmol/L — ABNORMAL LOW (ref 20–29)
Calcium: 9.7 mg/dL (ref 8.7–10.3)
Chloride: 104 mmol/L (ref 96–106)
Creatinine, Ser: 1.07 mg/dL — ABNORMAL HIGH (ref 0.57–1.00)
Globulin, Total: 2.7 g/dL (ref 1.5–4.5)
Glucose: 133 mg/dL — ABNORMAL HIGH (ref 65–99)
Potassium: 4.1 mmol/L (ref 3.5–5.2)
Sodium: 141 mmol/L (ref 134–144)
Total Protein: 6.8 g/dL (ref 6.0–8.5)
eGFR: 56 mL/min/{1.73_m2} — ABNORMAL LOW (ref >59)

## 2020-03-31 LAB — MICROALBUMIN / CREATININE URINE RATIO
Creatinine, Urine: 19.6 mg/dL
Microalb/Creat Ratio: <15 mg/g creat (ref 0–29)
Microalbumin, Urine: <3 ug/mL

## 2020-03-31 NOTE — Addendum Note (Signed)
Addended byGaylan Gerold on: 03/31/2020 03:02 PM   Modules accepted: Orders

## 2020-03-31 NOTE — Progress Notes (Signed)
Internal Medicine Clinic Attending  Case discussed with Dr. Nguyen  At the time of the visit.  We reviewed the resident's history and exam and pertinent patient test results.  I agree with the assessment, diagnosis, and plan of care documented in the resident's note. 

## 2020-04-02 ENCOUNTER — Encounter: Payer: Self-pay | Admitting: Student

## 2020-04-02 ENCOUNTER — Other Ambulatory Visit: Payer: Self-pay

## 2020-04-02 ENCOUNTER — Ambulatory Visit (INDEPENDENT_AMBULATORY_CARE_PROVIDER_SITE_OTHER): Payer: Medicare HMO | Admitting: Student

## 2020-04-02 VITALS — BP 140/88 | HR 66 | Ht 62.5 in | Wt 215.0 lb

## 2020-04-02 DIAGNOSIS — Z0000 Encounter for general adult medical examination without abnormal findings: Secondary | ICD-10-CM

## 2020-04-02 LAB — PROTEIN / CREATININE RATIO, URINE
Creatinine, Urine: 19.9 mg/dL
Protein, Ur: 4.9 mg/dL
Protein/Creat Ratio: 246 mg/g creat — ABNORMAL HIGH (ref 0–200)

## 2020-04-02 LAB — SPECIMEN STATUS REPORT

## 2020-04-02 NOTE — Progress Notes (Signed)
Internal Medicine Clinic Attending  Case discussed with Dr. Alfonse Spruce at the time of the visit.  We reviewed the AWV findings.  I agree with the assessment, diagnosis, and plan of care documented in the AWV note.

## 2020-04-02 NOTE — Progress Notes (Signed)
I discussed the AWV findings with the RN who conducted the visit. I was present in the office suite and immediately available to provide assistance and direction throughout the time the service was provided.   

## 2020-04-02 NOTE — Patient Instructions (Addendum)
Progress Notes by Jean Rosenthal, MD at 12/11/2019 4:38 PM  Author: Jean Rosenthal, MD Author Type: Resident Filed: 12/11/2019 4:42 PM  Note Status: Sign when Signing Visit Cosign: Cosign Not Required Encounter Date: 12/11/2019  Editor: Jean Rosenthal, MD (Resident)             Things That May Be Affecting Your Health:  Alcohol  Hearing loss  Pain    Depression  Home Safety  Sexual Health   Diabetes  Lack of physical activity  Stress   Difficulty with daily activities  Loneliness  Tiredness   Drug use  Medicines  Tobacco use   Falls  Motor Vehicle Safety  Weight   Food choices  Oral Health  Other    [place] : 1. Schedule your next subsequent Medicare Wellness visit in one year 2. Attend all of your regular appointments to address your medical issues 3. Complete the preventative screenings and services 4.  Attend your appointment at [facility] for your DEXA scan on 08/09/2020 @ 2:30 5. Congratulations on your weight loss goal!!    Annual Wellness Visit                       Medicare Covered Preventative Screenings and Services  Services & Screenings Men and Women Who How Often Need? Date of Last Service Action  Abdominal Aortic Aneurysm Adults with AAA risk factors Once     Alcohol Misuse and Counseling All Adults Screening once a year if no alcohol misuse. Counseling up to 4 face to face sessions.     Bone Density Measurement  Adults at risk for osteoporosis Once every 2 yrs X 12/28/2016   Lipid Panel Z13.6 All adults without CV disease Once every 5 yrs X 11/19/2014   Colorectal Cancer   Stool sample or  Colonoscopy All adults 82 and older   Once every year  Every 10 years     Depression All Adults Once a year  Today   Diabetes Screening Blood glucose, post glucose load, or GTT Z13.1  All adults at risk  Pre-diabetics  Once per year  Twice per year     Diabetes  Self-Management Training All adults  Diabetics 10 hrs first year; 2 hours subsequent years. Requires Copay     Glaucoma  Diabetics  Family history of glaucoma  African Americans 94 yrs +  Hispanic Americans 61 yrs + Annually - requires coppay     Hepatitis C Z72.89 or F19.20  High Risk for HCV  Born between 1945 and 1965  Annually  Once     HIV Z11.4 All adults based on risk  Annually btw ages 78 & 6 regardless of risk  Annually > 65 yrs if at increased risk     Lung Cancer Screening Asymptomatic adults aged 36-77 with 30 pack yr history and current smoker OR quit within the last 15 yrs Annually Must have counseling and shared decision making documentation before first screen     Medical Nutrition Therapy Adults with   Diabetes  Renal disease  Kidney transplant within past 3 yrs 3 hours first year; 2 hours subsequent years     Obesity and Counseling All adults Screening once a year Counseling if BMI 30 or higher  Today   Tobacco Use Counseling Adults who use tobacco  Up to 8 visits in one year     Vaccines Z23  Hepatitis B  Influenza   Pneumonia  Adults   Once  Once every flu season  Two different vaccines separated by one year     Next Annual Wellness Visit People with Medicare Every year  Today     [place] Women Who How Often Need  Date of Last Service Action  Mammogram  Z12.31 Women over 69 One baseline ages 44-39. Annually ager 53 yrs+  04/14/2019   Pap tests All women Annually if high risk. Every 2 yrs for normal risk women     Screening for cervical cancer with   Pap (Z01.419 nl or Z01.411abnl) &  HPV Z11.51 Women aged 79 to 56 Once every 5 yrs     Screening pelvic and breast exams All women Annually if high risk. Every 2 yrs for normal risk women     Sexually Transmitted Diseases  Chlamydia  Gonorrhea  Syphilis All at risk adults Annually for non pregnant females at increased risk         [place] Men Who How Ofter Need  Date of Last Service Action  Prostate Cancer - DRE & PSA Men over 50 Annually.  DRE might require a copay.     Sexually Transmitted Diseases  Syphilis All at risk adults Annually for men at increased risk              Bone Density Test A bone density test uses a type of X-ray to measure the amount of calcium and other minerals in a person's bones. It can measure bone density in the hip and the spine. The test is similar to having a regular X-ray. This test may also be called:  Bone densitometry.  Bone mineral density test.  Dual-energy X-ray absorptiometry (DEXA). You may have this test to:  Diagnose a condition that causes weak or thin bones (osteoporosis).  Screen you for osteoporosis.  Predict your risk for a broken bone (fracture).  Determine how well your osteoporosis treatment is working. Tell a health care provider about:  Any allergies you have.  All medicines you are taking, including vitamins, herbs, eye drops, creams, and over-the-counter medicines.  Any problems you or family members have had with anesthetic medicines.  Any blood disorders you have.  Any surgeries you have had.  Any medical conditions you have.  Whether you are pregnant or may be pregnant.  Any medical tests you have had within the past 14 days that used contrast material. What are the risks? Generally, this is a safe test. However, it does expose you to a small amount of radiation, which can slightly increase your cancer risk. What happens before the test?  Do not take any calcium supplements within the 24 hours before your test.  You will need to remove all metal jewelry, eyeglasses, removable dental appliances, and any other metal objects on your body. What happens during the test?  You will lie down on an exam table. There will be an X-ray generator below you and an imaging device above you.  Other devices, such as boxes or braces,  may be used to position your body properly for the scan.  The machine will slowly scan your body. You will need to keep very still while the machine does the scan.  The images will show up on a screen in the room. Images will be examined by a specialist after your test is finished. The procedure may vary among health care providers and hospitals.   What can I expect after the test? It is up  to you to get the results of your test. Ask your health care provider, or the department that is doing the test, when your results will be ready. Summary  A bone density test is an imaging test that uses a type of X-ray to measure the amount of calcium and other minerals in your bones.  The test may be used to diagnose or screen you for a condition that causes weak or thin bones (osteoporosis), predict your risk for a broken bone (fracture), or determine how well your osteoporosis treatment is working.  Do not take any calcium supplements within 24 hours before your test.  Ask your health care provider, or the department that is doing the test, when your results will be ready. This information is not intended to replace advice given to you by your health care provider. Make sure you discuss any questions you have with your health care provider. Document Revised: 06/12/2019 Document Reviewed: 06/12/2019 Elsevier Patient Education  2021 [place] Prevention in the Home, Adult Falls can cause injuries and can happen to people of all ages. There are many things you can do to make your home safe and to help prevent falls. Ask for help when making these changes. What actions can I take to prevent falls? General Instructions  Use good lighting in all rooms. Replace any light bulbs that burn out.  Turn on the lights in dark areas. Use night-lights.  Keep items that you use often in easy-to-reach places. Lower the shelves around your home if needed.  Set up your furniture so you have a clear  path. Avoid moving your furniture around.  Do not have throw rugs or other things on the floor that can make you trip.  Avoid walking on wet floors.  If any of your floors are uneven, fix them.  Add color or contrast paint or tape to clearly mark and help you see: ? Grab bars or handrails. ? First and last steps of staircases. ? Where the edge of each step is.  If you use a stepladder: ? Make sure that it is fully opened. Do not climb a closed stepladder. ? Make sure the sides of the stepladder are locked in place. ? Ask someone to hold the stepladder while you use it.  Know where your pets are when moving through your home. What can I do in the bathroom?  Keep the floor dry. Clean up any water on the floor right away.  Remove soap buildup in the tub or shower.  Use nonskid mats or decals on the floor of the tub or shower.  Attach bath mats securely with double-sided, nonslip rug tape.  If you need to sit down in the shower, use a plastic, nonslip stool.  Install grab bars by the toilet and in the tub and shower. Do not use towel bars as grab bars.      What can I do in the bedroom?  Make sure that you have a light by your bed that is easy to reach.  Do not use any sheets or blankets for your bed that hang to the floor.  Have a firm chair with side arms that you can use for support when you get dressed. What can I do in the kitchen?  Clean up any spills right away.  If you need to reach something above you, use a step stool with a grab bar.  Keep electrical cords out of the way.  Do not use floor  polish or wax that makes floors slippery. What can I do with my stairs?  Do not leave any items on the stairs.  Make sure that you have a light switch at the top and the bottom of the stairs.  Make sure that there are handrails on both sides of the stairs. Fix handrails that are broken or loose.  Install nonslip stair treads on all your stairs.  Avoid having throw  rugs at the top or bottom of the stairs.  Choose a carpet that does not hide the edge of the steps on the stairs.  Check carpeting to make sure that it is firmly attached to the stairs. Fix carpet that is loose or worn. What can I do on the outside of my home?  Use bright outdoor lighting.  Fix the edges of walkways and driveways and fix any cracks.  Remove anything that might make you trip as you walk through a door, such as a raised step or threshold.  Trim any bushes or trees on paths to your home.  Check to see if handrails are loose or broken and that both sides of all steps have handrails.  Install guardrails along the edges of any raised decks and porches.  Clear paths of anything that can make you trip, such as tools or rocks.  Have leaves, snow, or ice cleared regularly.  Use sand or salt on paths during winter.  Clean up any spills in your garage right away. This includes grease or oil spills. What other actions can I take?  Wear shoes that: ? Have a low heel. Do not wear high heels. ? Have rubber bottoms. ? Feel good on your feet and fit well. ? Are closed at the toe. Do not wear open-toe sandals.  Use tools that help you move around if needed. These include: ? Canes. ? Walkers. ? Scooters. ? Crutches.  Review your medicines with your doctor. Some medicines can make you feel dizzy. This can increase your chance of falling. Ask your doctor what else you can do to help prevent falls. Where to find more information  Centers for Disease Control and Prevention, [employer]AD[employer]: http://www.wolf.info/  National Institute on Aging: http://kim-miller.com/ Contact a doctor if:  You are afraid of falling at home.  You feel weak, drowsy, or dizzy at home.  You fall at home. Summary  There are many simple things that you can do to make your home safe and to help prevent falls.  Ways to make your home safe include removing things that can make you trip and installing grab bars in the  bathroom.  Ask for help when making these changes in your home. This information is not intended to replace advice given to you by your health care provider. Make sure you discuss any questions you have with your health care provider. Document Revised: 07/30/2019 Document Reviewed: 07/30/2019 Elsevier Patient Education  [place] Maintenance, Female Adopting a healthy lifestyle and getting preventive care are important in promoting health and wellness. Ask your health care provider about:  The right schedule for you to have regular tests and exams.  Things you can do on your own to prevent diseases and keep yourself healthy. What should I know about diet, weight, and exercise? Eat a healthy diet  Eat a diet that includes plenty of vegetables, fruits, low-fat dairy products, and lean protein.  Do not eat a lot of foods that are high in solid fats, added sugars, or sodium.  Maintain a healthy weight Body mass index (BMI) is used to identify weight problems. It estimates body fat based on height and weight. Your health care provider can help determine your BMI and help you achieve or maintain a healthy weight. Get regular exercise Get regular exercise. This is one of the most important things you can do for your health. Most adults should:  Exercise for at least 150 minutes each week. The exercise should increase your heart rate and make you sweat (moderate-intensity exercise).  Do strengthening exercises at least twice a week. This is in addition to the moderate-intensity exercise.  Spend less time sitting. Even light physical activity can be beneficial. Watch cholesterol and blood lipids Have your blood tested for lipids and cholesterol at 72 years of age, then have this test every 5 years. Have your cholesterol levels checked more often if:  Your lipid or cholesterol levels are high.  You are older than 72 years of age.  You are at high risk for heart  disease. What should I know about cancer screening? Depending on your health history and family history, you may need to have cancer screening at various ages. This may include screening for:  Breast cancer.  Cervical cancer.  Colorectal cancer.  Skin cancer.  Lung cancer. What should I know about heart disease, diabetes, and high blood pressure? Blood pressure and heart disease  High blood pressure causes heart disease and increases the risk of stroke. This is more likely to develop in people who have high blood pressure readings, are of African descent, or are overweight.  Have your blood pressure checked: ? Every 3-5 years if you are 90-43 years of age. ? Every year if you are 44 years old or older. Diabetes Have regular diabetes screenings. This checks your fasting blood sugar level. Have the screening done:  Once every three years after age 49 if you are at a normal weight and have a low risk for diabetes.  More often and at a younger age if you are overweight or have a high risk for diabetes. What should I know about preventing infection? Hepatitis B If you have a higher risk for hepatitis B, you should be screened for this virus. Talk with your health care provider to find out if you are at risk for hepatitis B infection. Hepatitis C Testing is recommended for:  Everyone born from [birth year] through 1965.  Anyone with known risk factors for hepatitis C. Sexually transmitted infections (STIs)  Get screened for STIs, including gonorrhea and chlamydia, if: ? You are sexually active and are younger than 72 years of age. ? You are older than 72 years of age and your health care provider tells you that you are at risk for this type of infection. ? Your sexual activity has changed since you were last screened, and you are at increased risk for chlamydia or gonorrhea. Ask your health care provider if you are at risk.  Ask your health care provider about whether you are at high  risk for HIV. Your health care provider may recommend a prescription medicine to help prevent HIV infection. If you choose to take medicine to prevent HIV, you should first get tested for HIV. You should then be tested every 3 months for as long as you are taking the medicine. Pregnancy  If you are about to stop having your period (premenopausal) and you may become pregnant, seek counseling before you get pregnant.  Take 400 to 800 micrograms (mcg) of folic  acid every day if you become pregnant.  Ask for birth control (contraception) if you want to prevent pregnancy. Osteoporosis and menopause Osteoporosis is a disease in which the bones lose minerals and strength with aging. This can result in bone fractures. If you are 31 years old or older, or if you are at risk for osteoporosis and fractures, ask your health care provider if you should:  Be screened for bone loss.  Take a calcium or vitamin D supplement to lower your risk of fractures.  Be given hormone replacement therapy (HRT) to treat symptoms of menopause. Follow these instructions at home: Lifestyle  Do not use any products that contain nicotine or tobacco, such as cigarettes, e-cigarettes, and chewing tobacco. If you need help quitting, ask your health care provider.  Do not use street drugs.  Do not share needles.  Ask your health care provider for help if you need support or information about quitting drugs. Alcohol use  Do not drink alcohol if: ? Your health care provider tells you not to drink. ? You are pregnant, may be pregnant, or are planning to become pregnant.  If you drink alcohol: ? Limit how much you use to 0-1 drink a day. ? Limit intake if you are breastfeeding.  Be aware of how much alcohol is in your drink. In the [place] one drink equals one 12 oz bottle of beer (355 mL), one 5 oz glass of wine (148 mL), or one 1 oz glass of hard liquor (44 mL). General instructions  Schedule regular health, dental,  and eye exams.  Stay current with your vaccines.  Tell your health care provider if: ? You often feel depressed. ? You have ever been abused or do not feel safe at home. Summary  Adopting a healthy lifestyle and getting preventive care are important in promoting health and wellness.  Follow your health care provider's instructions about healthy diet, exercising, and getting tested or screened for diseases.  Follow your health care provider's instructions on monitoring your cholesterol and blood pressure. This information is not intended to replace advice given to you by your health care provider. Make sure you discuss any questions you have with your health care provider. Document Revised: 12/19/2017 Document Reviewed: 12/19/2017 Elsevier Patient Education  2021 [employer].

## 2020-04-02 NOTE — Progress Notes (Signed)
This AWV is being conducted by [place] only. The patient was located at home and I was located in [facility]. The patient's identity was confirmed using their DOB and current address. The patient or his/her legal guardian has consented to being evaluated through a telephone encounter and understands the associated risks (an examination cannot be done and the patient may need to come in for an appointment) / benefits (allows the patient to remain at home, decreasing exposure to coronavirus). I personally spent 55 minutes conducting the AWV.  Subjective:   Michelle Hurley is a 71 y.o. female who presents for a Medicare Annual Wellness Visit.  The following items have been reviewed and updated today in the appropriate area in the EMR.   Health Risk Assessment  Height, weight, BMI, and BP Visual acuity if needed Depression screen Fall risk / safety level Advance directive discussion Medical and family history were reviewed and updated Updating list of other providers & suppliers Medication reconciliation, including over the counter medicines Cognitive screen Written screening schedule Risk Factor list Personalized health advice, risky behaviors, and treatment advice  Social History   Social History Narrative   Current Social History 04/02/2020        Patient lives alone in an apartment which is 1 story. There are not steps up to the entrance.       Patient's method of transportation is personal car.      The highest level of education was some college.      The patient currently retired.      Identified important Relationships are "God and family"       Pets : None       Interests / Fun: Travel with her sisters, although this has stopped d/t Covid       Current Stressors: Right shoulder to lower-mid back pain X 3-4 months              Objective:    Vitals: BP 140/88 (BP Location: Left Wrist)   Pulse 66   Ht 5' 2.5" (1.588 m)   Wt 215 lb (97.5 kg)   LMP  04/05/2012   BMI 38.70 kg/m  Vitals are patient reported  Activities of Daily Living In your present state of health, do you have any difficulty performing the following activities: 04/02/2020 03/30/2020  Hearing? N N  Vision? N N  Difficulty concentrating or making decisions? N N  Walking or climbing stairs? Y Y  Comment knees hurt -  Dressing or bathing? N N  Doing errands, shopping? N N  Some recent data might be hidden    Goals Goals    . Weight Loss     Lose 30lbs over the next year       Fall Risk Fall Risk  04/02/2020 03/30/2020 01/07/2020 10/20/2019 09/25/2019  Falls in the past year? 0 0 0 0 0  Number falls in past yr: - - - - -  Comment - - - - -  Injury with Fall? - - - - -  Risk Factor Category  - - - - -  Risk for fall due to : - - Impaired balance/gait - No Fall Risks  Follow up Falls evaluation completed - Falls evaluation completed Falls evaluation completed -    Depression Screen PHQ 2/9 Scores 04/02/2020 03/30/2020 02/25/2020 01/07/2020  PHQ - 2 Score 0 0 0 0  PHQ- 9 Score 2 2 2 1   Exception Documentation - - - -  Cognitive Testing Six-Item Cognitive Screener   "I would like to ask you some questions that ask you to use your memory. I am going to name three objects. Please wait until I say all three words, then repeat them. Remember what they are  because I am going to ask you to name them again in a few minutes. Please repeat these words for me: APPLE--TABLE--PENNY." (Interviewer may repeat names 3 times if necessary but repetition not scored.)  Did patient correctly repeat all three words? Yes - may proceed with screen  What year is this? Correct What month is this? Correct What day of the week is this? Correct  What were the three objects I asked you to remember? . Apple Correct . Table Correct . Penny Correct  Score one point for each incorrect answer.  A score of 2 or more points warrants additional investigation.  Patient's score  0     Assessment and Plan:     The patient was reminded to attend her appointment on 08/09/20 @ 2:30 for her DEXA screen.  The patient states she is using hr compression stockings when ambulating and when driving.  She states she is also continuing to elevate her legs when at home.  The patient states her Dupixent, Cimetidine and Hydroxyzine-Pamoate have all been discontinued by her Dermatologist.  The patient c/o ongoing pain in her right shoulder to her mid back which she reports his effecting her activities of daily living.  A f/u appointment for pain management was scheduled for 04/06/20 @ 0845 with Dr. Alfonse Spruce.  The patient reports a weight loss goal of 30lbs she would like to achieve over the next year and has started going to the gym.  During the course of the visit the patient was educated and counseled about appropriate screening and preventive services as documented in the assessment and plan.  The printed AVS was given to the patient and included an updated screening schedule, a list of risk factors, and personalized health advice.        Higinio Roger, RN  04/02/2020

## 2020-04-06 ENCOUNTER — Other Ambulatory Visit: Payer: Self-pay

## 2020-04-06 ENCOUNTER — Ambulatory Visit (INDEPENDENT_AMBULATORY_CARE_PROVIDER_SITE_OTHER): Payer: Medicare HMO | Admitting: Student

## 2020-04-06 ENCOUNTER — Encounter: Payer: Self-pay | Admitting: Student

## 2020-04-06 DIAGNOSIS — M549 Dorsalgia, unspecified: Secondary | ICD-10-CM

## 2020-04-06 DIAGNOSIS — M546 Pain in thoracic spine: Secondary | ICD-10-CM

## 2020-04-06 MED ORDER — DULOXETINE HCL 60 MG PO CPEP: 60.0000 mg | ORAL_CAPSULE | Freq: Every day | ORAL | 0 refills | Status: DC

## 2020-04-06 MED ORDER — CYCLOBENZAPRINE HCL 5 MG PO TABS: 5.0000 mg | ORAL_TABLET | Freq: Three times a day (TID) | ORAL | 0 refills | Status: DC | PRN

## 2020-04-06 MED ORDER — GABAPENTIN 300 MG PO CAPS: 300.0000 mg | ORAL_CAPSULE | Freq: Three times a day (TID) | ORAL | 11 refills | Status: DC

## 2020-04-06 NOTE — Patient Instructions (Addendum)
Michelle Hurley,  I am sorry that you are in so much pain.  I will increase your Gabapentin to 3 times a day.  I also refill the muscle relaxant Flexeril and at a new medication called duloxetine for your nerve pain.  Gabapentin and Flexeril can cause drowsiness so please be careful and avoid driving when taking this medication.  I will get in touch with your neurologist about your condition.  Take care  Dr. Alfonse Spruce

## 2020-04-06 NOTE — Assessment & Plan Note (Addendum)
Patient presented to the clinic for severe upper back pain that radiates to her right side.  States the pain is 10/10.  States the pain started last November, comes and goes.  States the pain has worsened significantly over the last week.  States the pain is limiting her daily activities such as cooking or driving.  States she cannot sleep because of the pain.  States that pain is better if she leans forward or side bend to the left.  Tylenol and gabapentin offer minimal relief.  She denies any focal weaknesses, cervical tenderness or headache.   Patient denies history of shingles.  Patient has had similar issue last November.  MRI of her brain, cervical and thoracic was obtained due to history of neuromyelitis optica.  All imaging came back negative for demyelinating disease.  There are degenerative changes and foraminal narrowing of C4-8 and also T4-T9.  Patient received her last rituximab shot in February.  Physical exam is remarkable for significant tenderness to palpation of right upper back.  Patient got normal strength of bilateral upper extremities.  She report numbness at the triceps area  Assessment and plan Differentials include nerve impingement from her degenerative disease vs spinal stenosis vs neuromyelitis optica vs other neurological conditions.    There is evidence of degenerative disease on MRI.  There is also diffuse spinal stenosis of C3-C6 on MRI, and patient also feels better with bending forward. Her pain feels more like neuropathic pain that radiates to her right side with associated T1-T2 dermatome.  Unsure if this is related to her neuromyelitis optica even though MRI did not show any demyelinating disease.  No shingles observed on the skin either.  We will treat conservatively with gabapentin, duloxetine and Flexeril.  Increase gabapentin 300 mg to 3 times daily and add Flexeril for muscle hypertonicity.  Add duloxetine for neuropathic pain.  I also message Dr. Leta Baptist, her  neurologist, about this issue to see if he has other recommendations or wants to see her in his office.  -Gabapentin 300 mg 3 times daily -Flexeril 5 mg 3 times daily as needed.  Counseled patient on drowsiness side effect of gabapentin and Flexeril.  Advised patient to be careful and avoid driving when taking this medication. -Duloxetine 60 mg daily -I will follow up with her neurologist

## 2020-04-06 NOTE — Progress Notes (Signed)
CC: Upper back pain  HPI:  [name] is a 70 y.o. with past medical history of hypertension, hyperlipidemia, neuromyelitis optica, who presented to the clinic for upper back pain.   Please see problem based charting for further detail  Past Medical History:  Diagnosis Date  . Abdominal pain 05/22/2014  . Allergic rhinitis 04/16/2015  . Anxiety state, unspecified   . ARTHRALGIA UNSPECIFIED SITE 12/04/2008   Qualifier: Diagnosis of  By: Melvyn Novas MD, Christena Deem   . Arthritis   . Atherosclerosis of abdominal aorta ([place]) 10/01/2015   Aortoiliac atherosclerosis noted on CT abdomen in 2016.  Marland Kitchen Atypical chest pain 12/11/2018  . Cough with congestion of paranasal sinus 06/01/2015  . Dizziness 04/06/2012  . Essential hypertension 12/24/2006  . Estrogen deficiency 11/16/2016  . GERD (gastroesophageal reflux disease) 08/07/2018  . H/O: hysterectomy   . Hx-TIA (transient ischemic attack) 04/06/2012  . Hypertension   . Hypokalemia 02/12/2018  . Influenza-like illness 02/02/2016  . Loss of part of visual field   . Morbid obesity ([place])   . Myopia   . Nausea with vomiting 05/23/2014  . Neuromyelitis optica ([place]) 01/22/2018  . NSVT (nonsustained ventricular tachycardia) ([place])    a. 01/2005 Echo: EF 55-65%  . Osteopenia   . Other and unspecified hyperlipidemia   . Palpitations   . Plantar fasciitis, bilateral 06/12/2016  . Plantar fasciitis, bilateral 06/12/2016  . Rash 03/23/2017  . Right ankle injury, initial encounter 06/12/2016  . Right elbow pain 06/11/2017  . Right leg pain 09/06/2016  . Right optic neuritis 01/22/2018  . Sciatica 01/15/2018  . Shoulder pain, bilateral (Concern for CAD) 08/28/2018  . Shoulder pain, bilateral (Concern for CAD) 08/28/2018  . Umbilical hernia   . Unspecified essential hypertension   . Vaginal candidiasis 02/12/2018  . VENTRICULAR TACHYCARDIA 12/04/2008   F/u  [facility]    . Viral upper respiratory tract infection 01/15/2018  . Vitamin D deficiency 02/12/2018    Review of Systems:  Review of Systems  Eyes: Negative for blurred vision.  Musculoskeletal: Positive for back pain and myalgias. Negative for neck pain.  Neurological: Positive for sensory change. Negative for focal weakness, weakness and headaches.    Physical Exam:  Vitals:   04/06/20 0848  BP: 121/72  Pulse: 74  Temp: 97.8 F (36.6 C)  TempSrc: Oral  SpO2: 98%  Weight: 217 lb 11.2 oz (98.7 kg)  Height: 5\' 2"  (1.575 m)   Physical Exam Constitutional:      General: She is in acute distress.  HENT:     Head: Normocephalic.  Eyes:     General:        Right eye: No discharge.        Left eye: No discharge.  Cardiovascular:     Rate and Rhythm: Normal rate and regular rhythm.  Pulmonary:     Effort: Pulmonary effort is normal. No respiratory distress.  Musculoskeletal:     Comments: Patient has significant pain to palpation of the spinous process at T2-T4.  Also diffuse myalgia of the right upper back.  There is significant improvement of the pain with bending forward.  Diffuse muscle hypertonicity of bilateral upper back. Report numbness sensation of the tricep region. No pain to palpation of the cervical region. Normal strength of bilateral upper arms.  Skin:    Comments: No rash observed to back area  Neurological:     Mental Status: She is alert.     Assessment & Plan:  See Encounters Tab for problem based charting.  Patient discussed with Dr. Philipp Ovens

## 2020-04-06 NOTE — Progress Notes (Signed)
Internal Medicine Clinic Attending  Case discussed with Dr. Alfonse Spruce soon after the resident saw the patient.  We reviewed the AWV findings.  I agree with the assessment, diagnosis, and plan of care documented in the AWV note.

## 2020-04-09 ENCOUNTER — Other Ambulatory Visit: Payer: Self-pay | Admitting: Student

## 2020-04-09 DIAGNOSIS — J309 Allergic rhinitis, unspecified: Secondary | ICD-10-CM

## 2020-04-12 ENCOUNTER — Telehealth: Payer: Self-pay

## 2020-04-12 NOTE — Telephone Encounter (Signed)
Fax received from [facility] stating that the patient has requested for her Dupixent to be put on hold for two months due to side effects.  Per Iantha Fallen they will follow up with the patient on 06/10/2020 per the patient's request.

## 2020-04-13 ENCOUNTER — Other Ambulatory Visit: Payer: Self-pay | Admitting: Student

## 2020-04-13 DIAGNOSIS — E7800 Pure hypercholesterolemia, unspecified: Secondary | ICD-10-CM

## 2020-04-17 ENCOUNTER — Other Ambulatory Visit: Payer: Self-pay | Admitting: Student

## 2020-04-17 DIAGNOSIS — M549 Dorsalgia, unspecified: Secondary | ICD-10-CM

## 2020-04-18 ENCOUNTER — Ambulatory Visit (INDEPENDENT_AMBULATORY_CARE_PROVIDER_SITE_OTHER): Payer: Medicare HMO | Admitting: Neurology

## 2020-04-18 ENCOUNTER — Other Ambulatory Visit: Payer: Self-pay

## 2020-04-18 DIAGNOSIS — G4720 Circadian rhythm sleep disorder, unspecified type: Secondary | ICD-10-CM

## 2020-04-18 DIAGNOSIS — Z820 Family history of epilepsy and other diseases of the nervous system: Secondary | ICD-10-CM

## 2020-04-18 DIAGNOSIS — G4710 Hypersomnia, unspecified: Secondary | ICD-10-CM

## 2020-04-18 DIAGNOSIS — G4719 Other hypersomnia: Secondary | ICD-10-CM

## 2020-04-18 DIAGNOSIS — R0683 Snoring: Secondary | ICD-10-CM

## 2020-04-18 DIAGNOSIS — Z9189 Other specified personal risk factors, not elsewhere classified: Secondary | ICD-10-CM

## 2020-04-18 DIAGNOSIS — E669 Obesity, unspecified: Secondary | ICD-10-CM

## 2020-04-18 DIAGNOSIS — G479 Sleep disorder, unspecified: Secondary | ICD-10-CM

## 2020-04-18 DIAGNOSIS — R519 Headache, unspecified: Secondary | ICD-10-CM

## 2020-04-20 ENCOUNTER — Other Ambulatory Visit: Payer: Self-pay

## 2020-04-20 ENCOUNTER — Ambulatory Visit (INDEPENDENT_AMBULATORY_CARE_PROVIDER_SITE_OTHER): Payer: Medicare HMO | Admitting: Internal Medicine

## 2020-04-20 ENCOUNTER — Encounter: Payer: Self-pay | Admitting: Internal Medicine

## 2020-04-20 DIAGNOSIS — M542 Cervicalgia: Secondary | ICD-10-CM

## 2020-04-20 DIAGNOSIS — M546 Pain in thoracic spine: Secondary | ICD-10-CM | POA: Diagnosis not present

## 2020-04-20 DIAGNOSIS — I10 Essential (primary) hypertension: Secondary | ICD-10-CM | POA: Diagnosis not present

## 2020-04-20 DIAGNOSIS — M549 Dorsalgia, unspecified: Secondary | ICD-10-CM

## 2020-04-20 MED ORDER — CYCLOBENZAPRINE HCL 5 MG PO TABS: 5.0000 mg | ORAL_TABLET | Freq: Three times a day (TID) | ORAL | 1 refills | Status: DC | PRN

## 2020-04-20 NOTE — Progress Notes (Signed)
CC: Neck pain  HPI: [name] is a 70 y.o. with PMH listed below presenting with complaint of neck pain. Please see problem based assessment and plan for further details.  Past Medical History:  Diagnosis Date  . Abdominal pain 05/22/2014  . Allergic rhinitis 04/16/2015  . Anxiety state, unspecified   . ARTHRALGIA UNSPECIFIED SITE 12/04/2008   Qualifier: Diagnosis of  By: Melvyn Novas MD, Christena Deem   . Arthritis   . Atherosclerosis of abdominal aorta ([place]) 10/01/2015   Aortoiliac atherosclerosis noted on CT abdomen in 2016.  Marland Kitchen Atypical chest pain 12/11/2018  . Cough with congestion of paranasal sinus 06/01/2015  . Dizziness 04/06/2012  . Essential hypertension 12/24/2006  . Estrogen deficiency 11/16/2016  . GERD (gastroesophageal reflux disease) 08/07/2018  . H/O: hysterectomy   . Hx-TIA (transient ischemic attack) 04/06/2012  . Hypertension   . Hypokalemia 02/12/2018  . Influenza-like illness 02/02/2016  . Loss of part of visual field   . Morbid obesity ([place])   . Myopia   . Nausea with vomiting 05/23/2014  . Neuromyelitis optica ([place]) 01/22/2018  . NSVT (nonsustained ventricular tachycardia) ([place])    a. 01/2005 Echo: EF 55-65%  . Osteopenia   . Other and unspecified hyperlipidemia   . Palpitations   . Plantar fasciitis, bilateral 06/12/2016  . Plantar fasciitis, bilateral 06/12/2016  . Rash 03/23/2017  . Right ankle injury, initial encounter 06/12/2016  . Right elbow pain 06/11/2017  . Right leg pain 09/06/2016  . Right optic neuritis 01/22/2018  . Sciatica 01/15/2018  . Shoulder pain, bilateral (Concern for CAD) 08/28/2018  . Shoulder pain, bilateral (Concern for CAD) 08/28/2018  . Umbilical hernia   . Unspecified essential hypertension   . Vaginal candidiasis 02/12/2018  . VENTRICULAR TACHYCARDIA 12/04/2008   F/u  [facility]    . Viral upper respiratory tract infection 01/15/2018  . Vitamin D deficiency 02/12/2018   Review of Systems: Review of Systems  Constitutional: Negative for  chills, fever and malaise/fatigue.  Eyes: Negative for blurred vision.  Respiratory: Negative for shortness of breath.   Cardiovascular: Negative for chest pain, palpitations and leg swelling.  Gastrointestinal: Negative for constipation, diarrhea, nausea and vomiting.  Musculoskeletal: Positive for neck pain.  All other systems reviewed and are negative.   Physical Exam: Vitals:   04/20/20 1020  BP: 114/77  Pulse: 68  Temp: 98.6 F (37 C)  TempSrc: Oral  SpO2: 97%  Weight: 213 lb 14.4 oz (97 kg)  Height: 5\' 2"  (1.575 m)   Gen: Well-developed, well nourished, NAD HEENT: NCAT head, hearing intact CV: RRR, S1, S2 normal Pulm: CTAB, No rales, no wheezes Extm: ROM intact, Increased trapezius tonicity, Peripheral pulses intact, No peripheral edema Skin: Dry, Warm, normal turgor Neuro: Strength 5/5 on bilateral upper extremities, no focal numbness or tingling   Assessment & Plan:   Essential hypertension BP Readings from Last 3 Encounters:  04/20/20 114/77  04/06/20 121/72  04/02/20 140/88   At goal, currently on coreg 25mg  BID, verapamil 60mg  TID, spironolactone 25mg  BID. Appear to have been elevated last visit due to pain.  - C/w coreg 25mg  Bid, verapamil 60mg  TID, spironolactone 25mg  BID  Upper back pain on right side [name] is a 70 yo F w/ PMH of HTN, GERD, HLD presenting for f/u visit for management of her neck / back pain. She mentions she started duloxetine, flexeril and increased her dose of gabapetin per Dr.Nguyen's instructions and she has had significant relief of her symptoms. She mentions  continued numbness on her right arm intermittently. She does mention having some drowsiness with Flexeril but she states this has helped her get some rest.  A/P Presenting with chronic neck / back pain management. Hx of cervical spinal stenosis noted on prior MRI. Doing well on current regimen.  - C/w duloxetine 60mg  daily, gabapentin 300mg  TID - C/w Flexeril 5mg  PRN,  advised on adverse effects and need to avoid over-administration - F/u with neurology (has appt coming up this month)   Patient discussed with Dr. Dareen Piano  -Gilberto Better, [facility] Pager: [phone]

## 2020-04-20 NOTE — Assessment & Plan Note (Signed)
Ms.Bulow is a 71 yo F w/ PMH of HTN, GERD, HLD presenting for f/u visit for management of her neck / back pain. She mentions she started duloxetine, flexeril and increased her dose of gabapetin per Dr.Nguyen's instructions and she has had significant relief of her symptoms. She mentions continued numbness on her right arm intermittently. She does mention having some drowsiness with Flexeril but she states this has helped her get some rest.  A/P Presenting with chronic neck / back pain management. Hx of cervical spinal stenosis noted on prior MRI. Doing well on current regimen.  - C/w duloxetine 60mg  daily, gabapentin 300mg  TID - C/w Flexeril 5mg  PRN, advised on adverse effects and need to avoid over-administration - F/u with neurology (has appt coming up this month)

## 2020-04-20 NOTE — Assessment & Plan Note (Signed)
BP Readings from Last 3 Encounters:  04/20/20 114/77  04/06/20 121/72  04/02/20 140/88   At goal, currently on coreg 25mg  BID, verapamil 60mg  TID, spironolactone 25mg  BID. Appear to have been elevated last visit due to pain.  - C/w coreg 25mg  Bid, verapamil 60mg  TID, spironolactone 25mg  BID

## 2020-04-20 NOTE — Patient Instructions (Signed)
Thank you for allowing [name] to provide your care today. Today we discussed your neck pain    I have ordered no labs for you. I will call if any are abnormal.    Today we made no changes to your medications.    Please follow-up as needed.    Should you have any questions or concerns please call the internal medicine clinic at [phone].    Cyclobenzaprine tablets What is this medicine? CYCLOBENZAPRINE (sye kloe BEN za preen) is a muscle relaxer. It is used to treat muscle pain, spasms, and stiffness. This medicine may be used for other purposes; ask your health care provider or pharmacist if you have questions. COMMON BRAND NAME(S): Fexmid, Flexeril What should I tell my health care provider before I take this medicine? They need to know if you have any of these conditions:  heart disease, irregular heartbeat, or previous heart attack  liver disease  thyroid problem  an unusual or allergic reaction to cyclobenzaprine, tricyclic antidepressants, lactose, other medicines, foods, dyes, or preservatives  pregnant or trying to get pregnant  breast-feeding How should I use this medicine? Take this medicine by mouth with a glass of water. Follow the directions on the prescription label. If this medicine upsets your stomach, take it with food or milk. Take your medicine at regular intervals. Do not take it more often than directed. Talk to your pediatrician regarding the use of this medicine in children. Special care may be needed. Overdosage: If you think you have taken too much of this medicine contact a poison control center or emergency room at once. NOTE: This medicine is only for you. Do not share this medicine with others. What if I miss a dose? If you miss a dose, take it as soon as you can. If it is almost time for your next dose, take only that dose. Do not take double or extra doses. What may interact with this medicine? Do not take this medicine with any of the following  medications:  MAOIs like Carbex, Eldepryl, Marplan, Nardil, and Parnate  narcotic medicines for cough  safinamide This medicine may also interact with the following medications:  alcohol  bupropion  antihistamines for allergy, cough and cold  certain medicines for anxiety or sleep  certain medicines for bladder problems like oxybutynin, tolterodine  certain medicines for depression like amitriptyline, fluoxetine, sertraline  certain medicines for Parkinson's disease like benztropine, trihexyphenidyl  certain medicines for seizures like phenobarbital, primidone  certain medicines for stomach problems like dicyclomine, hyoscyamine  certain medicines for travel sickness like scopolamine  general anesthetics like halothane, isoflurane, methoxyflurane, propofol  ipratropium  local anesthetics like lidocaine, pramoxine, tetracaine  medicines that relax muscles for surgery  narcotic medicines for pain  phenothiazines like chlorpromazine, mesoridazine, prochlorperazine, thioridazine  verapamil This list may not describe all possible interactions. Give your health care provider a list of all the medicines, herbs, non-prescription drugs, or dietary supplements you use. Also tell them if you smoke, drink alcohol, or use illegal drugs. Some items may interact with your medicine. What should I watch for while using this medicine? Tell your doctor or health care professional if your symptoms do not start to get better or if they get worse. You may get drowsy or dizzy. Do not drive, use machinery, or do anything that needs mental alertness until you know how this medicine affects you. Do not stand or sit up quickly, especially if you are an older patient. This reduces the risk of dizzy  or fainting spells. Alcohol may interfere with the effect of this medicine. Avoid alcoholic drinks. If you are taking another medicine that also causes drowsiness, you may have more side effects. Give  your health care provider a list of all medicines you use. Your doctor will tell you how much medicine to take. Do not take more medicine than directed. Call emergency for help if you have problems breathing or unusual sleepiness. Your mouth may get dry. Chewing sugarless gum or sucking hard candy, and drinking plenty of water may help. Contact your doctor if the problem does not go away or is severe. What side effects may I notice from receiving this medicine? Side effects that you should report to your doctor or health care professional as soon as possible:  allergic reactions like skin rash, itching or hives, swelling of the face, lips, or tongue  breathing problems  chest pain  fast, irregular heartbeat  hallucinations  seizures  unusually weak or tired Side effects that usually do not require medical attention (report to your doctor or health care professional if they continue or are bothersome):  headache  nausea, vomiting This list may not describe all possible side effects. Call your doctor for medical advice about side effects. You may report side effects to FDA at 1-800-FDA-1088. Where should I keep my medicine? Keep out of the reach of children. Store at room temperature between 15 and 30 degrees C (59 and 86 degrees F). Keep container tightly closed. Throw away any unused medicine after the expiration date. NOTE: This sheet is a summary. It may not cover all possible information. If you have questions about this medicine, talk to your doctor, pharmacist, or health care provider.  2021 Elsevier/Gold Standard (2017-11-28 12:49:26)

## 2020-04-21 ENCOUNTER — Telehealth: Payer: Self-pay | Admitting: *Deleted

## 2020-04-21 ENCOUNTER — Ambulatory Visit: Payer: Medicare HMO | Admitting: Diagnostic Neuroimaging

## 2020-04-21 NOTE — Telephone Encounter (Signed)
Per [facility] patient cancelled delivery of her dupixent because her doctor advised her to.

## 2020-04-26 NOTE — Progress Notes (Signed)
Internal Medicine Clinic Attending  Case discussed with Dr. Nguyen  At the time of the visit.  We reviewed the resident's history and exam and pertinent patient test results.  I agree with the assessment, diagnosis, and plan of care documented in the resident's note. 

## 2020-04-28 NOTE — Progress Notes (Signed)
Internal Medicine Clinic Attending  Case discussed with Dr. Lee  At the time of the visit.  We reviewed the resident's history and exam and pertinent patient test results.  I agree with the assessment, diagnosis, and plan of care documented in the resident's note.    

## 2020-04-29 ENCOUNTER — Other Ambulatory Visit: Payer: Self-pay | Admitting: Internal Medicine

## 2020-04-30 NOTE — Procedures (Signed)
PATIENT'S NAME:  Michelle Hurley, Michelle Hurley DOB:      August 15, 1950      MR#:    [id number]     DATE OF RECORDING: 04/18/2020 REFERRING M.D.:  Nathanial Rancher, MD Study Performed:   Baseline Polysomnogram HISTORY: 70 year old woman with a history of hypertension, palpitations and history of an SVT, dizziness, reflux disease, history of TIA, neuromyelitis optica, arthritis, allergic rhinitis, anxiety, sciatica, vitamin D deficiency, low back pain with sciatica on the right, and obesity, who reports snoring and excessive daytime somnolence. The patient endorsed the Epworth Sleepiness Scale at 4 points. The patient's weight 216 pounds with a height of 62 (inches), resulting in a BMI of 39.8 kg/m2. The patient's neck circumference measured 16.8 inches.  CURRENT MEDICATIONS: Calcium-Vit D, Coreg, Flexeril, Dupixent, Lasix, Neurontin, Xyzal, Mevacor, Miralax, Rituxan, Aldactone, Calan   PROCEDURE:  This is a multichannel digital polysomnogram utilizing the Somnostar 11.2 system.  Electrodes and sensors were applied and monitored per AASM Specifications.   EEG, EOG, Chin and Limb EMG, were sampled at 200 Hz.  ECG, Snore and Nasal Pressure, Thermal Airflow, Respiratory Effort, CPAP Flow and Pressure, Oximetry was sampled at 50 Hz. Digital video and audio were recorded.      BASELINE STUDY  The HOB was elevated, about 20 degrees, patient reports she does not sleep flat. Lights Out was at 22:29 and Lights On at 04:47.  Total recording time (TRT) was 379 minutes, with a total sleep time (TST) of 338.5 minutes.   The patient's sleep latency was 24.5 minutes.  REM latency was 258.5 minutes, which is markedly delayed. The sleep efficiency was 89.3 %.     SLEEP ARCHITECTURE: WASO (Wake after sleep onset) was 25 minutes with mild sleep fragmentation noted. There were 13 minutes in Stage N1, 226 minutes Stage N2, 83 minutes Stage N3 and 16.5 minutes in Stage REM.  The percentage of Stage N1 was 3.8%, Stage N2 was 66.8%, which is  increased, Stage N3 was 24.5% and Stage R (REM sleep) was 4.9%, which is markedly reduced. The arousals were noted as: 36 were spontaneous, 0 were associated with PLMs, 6 were associated with respiratory events.  RESPIRATORY ANALYSIS:  There were a total of 11 respiratory events:  0 obstructive apneas, 2 central apneas and 0 mixed apneas with a total of 2 apneas and an apnea index (AI) of .4 /hour. There were 9 hypopneas with a hypopnea index of 1.6 /hour. The patient also had 0 respiratory event related arousals (RERAs).      The total APNEA/HYPOPNEA INDEX (AHI) was 1.9/hour and the total RESPIRATORY DISTURBANCE INDEX was  1.9 /hour.  1 events occurred in REM sleep and 18 events in NREM. The REM AHI was  3.6 /hour, versus a non-REM AHI of 1.9. The patient spent 338.5 minutes of total sleep time in the supine position and 0 minutes in non-supine.. The supine AHI was 2.0 versus a non-supine AHI of 0.0.  OXYGEN SATURATION & C02:  The Wake baseline 02 saturation was 94%, with the lowest being 85%. Time spent below 89% saturation equaled 2 minutes. PERIODIC LIMB MOVEMENTS: The patient had a total of 0 Periodic Limb Movements.  The Periodic Limb Movement (PLM) index was 0 and the PLM Arousal index was 0/hour.  Audio and video analysis did not show any abnormal or unusual movements, behaviors, phonations or vocalizations. The patient took 1 bathroom break. Mild, intermittent snoring was noted. The EKG was in keeping with normal sinus rhythm (NSR).  Post-study, the patient  indicated that sleep was better than usual.   IMPRESSION:  1. Primary Snoring 2. Dysfunctions associated with sleep stages or arousal from sleep  RECOMMENDATIONS:  1. This study does not demonstrate any significant obstructive or central sleep disordered breathing.  The reduced percentage of REM sleep may have underestimated her sleep disordered breathing to some degree.  Mild, intermittent snoring was noted.  Treatment with positive  airway pressure is not warranted based on this test.  Weight loss may aid in reducing her snoring. This study does not support an intrinsic sleep disorder as a cause of the patient's symptoms. Other causes, including circadian rhythm disturbances, an underlying mood disorder, medication effect and/or an underlying medical problem cannot be ruled out. 2. This study shows sleep fragmentation and abnormal sleep stage percentages; these are nonspecific findings and per se do not signify an intrinsic sleep disorder or a cause for the patient's sleep-related symptoms. Causes include (but are not limited to) the first night effect of the sleep study, circadian rhythm disturbances, medication effect or an underlying mood disorder or medical problem.  3. The patient should be cautioned not to drive, work at heights, or operate dangerous or heavy equipment when tired or sleepy. Review and reiteration of good sleep hygiene measures should be pursued with any patient. 4. The patient will be advised to follow up with the referring provider, who will be notified of the test results.  I certify that I have reviewed the entire raw data recording prior to the issuance of this report in accordance with the Standards of Accreditation of the American Academy of Sleep Medicine (AASM)   Star Age, MD, PhD Diplomat, American Board of Neurology and Sleep Medicine (Neurology and Sleep Medicine)

## 2020-04-30 NOTE — Progress Notes (Signed)
Patient referred by [name], seen by me on 03/24/20, diagnostic PSG on 04/18/20.   Please call and notify the patient that the recent sleep study did not show any significant obstructive sleep apnea. The reduced percentage of REM sleep may have underestimated her sleep disordered breathing to some degree.  Mild, intermittent snoring was noted.  Treatment with positive airway pressure is not warranted based on this test.  Weight loss may aid in reducing her snoring. She can follow up with her PCP and primary neurologist, Dr. Leta Hurley, as scheduled. Thanks,  Michelle Age, MD, PhD [facility])

## 2020-05-03 ENCOUNTER — Telehealth: Payer: Self-pay

## 2020-05-03 NOTE — Telephone Encounter (Signed)
-----   Message from Star Age, MD sent at 04/30/2020 11:31 AM EDT ----- Patient referred by Dr. Matilde Sprang, seen by me on 03/24/20, diagnostic PSG on 04/18/20.   Please call and notify the patient that the recent sleep study did not show any significant obstructive sleep apnea. The reduced percentage of REM sleep may have underestimated her sleep disordered breathing to some degree.  Mild, intermittent snoring was noted.  Treatment with positive airway pressure is not warranted based on this test.  Weight loss may aid in reducing her snoring. She can follow up with her PCP and primary neurologist, Dr. Leta Baptist, as scheduled. Thanks,  Star Age, MD, PhD [facility])

## 2020-05-03 NOTE — Telephone Encounter (Signed)
I called the pt and advised of results. Pt verbalized understanding and han no questions/concerns.  Report sent to the referring.

## 2020-05-06 ENCOUNTER — Other Ambulatory Visit: Payer: Self-pay | Admitting: Internal Medicine

## 2020-05-06 DIAGNOSIS — J309 Allergic rhinitis, unspecified: Secondary | ICD-10-CM

## 2020-05-19 ENCOUNTER — Other Ambulatory Visit: Payer: Self-pay | Admitting: Internal Medicine

## 2020-06-02 ENCOUNTER — Encounter: Payer: Self-pay | Admitting: *Deleted

## 2020-07-13 ENCOUNTER — Encounter: Payer: Self-pay | Admitting: *Deleted

## 2020-07-14 ENCOUNTER — Other Ambulatory Visit: Payer: Self-pay

## 2020-07-14 DIAGNOSIS — M549 Dorsalgia, unspecified: Secondary | ICD-10-CM

## 2020-07-14 MED ORDER — DULOXETINE HCL 60 MG PO CPEP: 60.0000 mg | ORAL_CAPSULE | Freq: Every day | ORAL | 0 refills | Status: DC

## 2020-07-14 NOTE — Telephone Encounter (Signed)
DULoxetine (CYMBALTA) 60 MG capsule (Expired, REFILL REQUEST @  [facility] (Now [facility]) - [place], [place] Phone:  [phone]  Fax:  [phone]

## 2020-07-19 ENCOUNTER — Other Ambulatory Visit: Payer: Self-pay | Admitting: Internal Medicine

## 2020-07-19 DIAGNOSIS — E7800 Pure hypercholesterolemia, unspecified: Secondary | ICD-10-CM

## 2020-07-20 ENCOUNTER — Ambulatory Visit (INDEPENDENT_AMBULATORY_CARE_PROVIDER_SITE_OTHER): Payer: Medicare HMO | Admitting: Internal Medicine

## 2020-07-20 VITALS — BP 134/74 | HR 69 | Temp 98.2°F | Ht 62.0 in | Wt 209.2 lb

## 2020-07-20 DIAGNOSIS — Z0000 Encounter for general adult medical examination without abnormal findings: Secondary | ICD-10-CM | POA: Diagnosis not present

## 2020-07-20 DIAGNOSIS — Z1231 Encounter for screening mammogram for malignant neoplasm of breast: Secondary | ICD-10-CM

## 2020-07-20 DIAGNOSIS — M549 Dorsalgia, unspecified: Secondary | ICD-10-CM | POA: Diagnosis not present

## 2020-07-20 DIAGNOSIS — E7800 Pure hypercholesterolemia, unspecified: Secondary | ICD-10-CM | POA: Diagnosis not present

## 2020-07-20 DIAGNOSIS — I10 Essential (primary) hypertension: Secondary | ICD-10-CM | POA: Diagnosis not present

## 2020-07-20 DIAGNOSIS — G629 Polyneuropathy, unspecified: Secondary | ICD-10-CM | POA: Insufficient documentation

## 2020-07-20 DIAGNOSIS — M5431 Sciatica, right side: Secondary | ICD-10-CM | POA: Insufficient documentation

## 2020-07-20 DIAGNOSIS — G6289 Other specified polyneuropathies: Secondary | ICD-10-CM

## 2020-07-20 MED ORDER — DULOXETINE HCL 60 MG PO CPEP: 60.0000 mg | ORAL_CAPSULE | Freq: Every day | ORAL | 0 refills | Status: DC

## 2020-07-20 MED ORDER — CYCLOBENZAPRINE HCL 5 MG PO TABS: 5.0000 mg | ORAL_TABLET | Freq: Three times a day (TID) | ORAL | 1 refills | Status: DC | PRN

## 2020-07-20 MED ORDER — LOVASTATIN 20 MG PO TABS: 20.0000 mg | ORAL_TABLET | Freq: Every day | ORAL | 1 refills | Status: DC

## 2020-07-20 NOTE — Assessment & Plan Note (Signed)
Today, Michelle Hurley states that her current regimen controls her pain well.  She is taking Cymbalta 60 mg daily and takes gabapentin 3 times daily as needed.  Occasionally her neuropathy leads to muscle spasms and she takes Flexeril for this as needed.  She is requesting refills today.  Assessment/plan: Patient has a past medical history of polyneuropathy that is multifactorial.  Neuropathy in her right lower extremity is documented to be secondary to chronic sciatica.  She is had multiple visits in the past for upper extremity numbness and tingling.  MRI C-spine and T-spine were obtained in November 2021; remarkable findings include chronic myelomalacia in the C-spine in addition to diffuse degenerative changes throughout the C-spine including foraminal stenosis and spinal stenosis.  T-spine MRI remarkable for multilevel degenerative changes but no spinal stenosis.  No other work-up for neuropathy at this time.  Patient does follow-up with neurology for her neuromyelitis optica.  - Refilled duloxetine 60 mg daily - Refilled Flexeril 5 mg as needed - Continue gabapentin 300 mg 3 times daily as needed

## 2020-07-20 NOTE — Progress Notes (Signed)
CC: HTN, Neuropathy  HPI:  Ms.Leaner Darnell Level Boyett is a 70 y.o. with a PMHx as listed below who presents to the clinic for HTN, Neuropathy.   Please see the Encounters tab for problem-based Assessment & Plan regarding status of patient's acute and chronic conditions.  Past Medical History:  Diagnosis Date   Abdominal pain 05/22/2014   Allergic rhinitis 04/16/2015   Anxiety state, unspecified    ARTHRALGIA UNSPECIFIED SITE 12/04/2008   Qualifier: Diagnosis of  By: Melvyn Novas MD, Christena Deem    Arthritis    Atherosclerosis of abdominal aorta ([place]) 10/01/2015   Aortoiliac atherosclerosis noted on CT abdomen in 2016.   Atypical chest pain 12/11/2018   Cough with congestion of paranasal sinus 06/01/2015   Dizziness 04/06/2012   Essential hypertension 12/24/2006   Estrogen deficiency 11/16/2016   GERD (gastroesophageal reflux disease) 08/07/2018   H/O: hysterectomy    Hx-TIA (transient ischemic attack) 04/06/2012   Hypertension    Hypokalemia 02/12/2018   Influenza-like illness 02/02/2016   Loss of part of visual field    Morbid obesity ([place])    Myopia    Nausea with vomiting 05/23/2014   Neuromyelitis optica ([place]) 01/22/2018   NSVT (nonsustained ventricular tachycardia) ([place])    a. 01/2005 Echo: EF 55-65%   Osteopenia    Other and unspecified hyperlipidemia    Palpitations    Plantar fasciitis, bilateral 06/12/2016   Plantar fasciitis, bilateral 06/12/2016   Rash 03/23/2017   Right ankle injury, initial encounter 06/12/2016   Right elbow pain 06/11/2017   Right leg pain 09/06/2016   Right optic neuritis 01/22/2018   Sciatica 01/15/2018   Shoulder pain, bilateral (Concern for CAD) 08/28/2018   Shoulder pain, bilateral (Concern for CAD) 1/93/7902   Umbilical hernia    Unspecified essential hypertension    Vaginal candidiasis 02/12/2018   VENTRICULAR TACHYCARDIA 12/04/2008   F/u  lhc Hochrein     Viral upper respiratory tract infection 01/15/2018   Vitamin D deficiency 02/12/2018   Review of Systems: Review of  Systems  Constitutional:  Negative for chills, fever, malaise/fatigue and weight loss.  Respiratory:  Negative for shortness of breath and wheezing.   Cardiovascular:  Negative for chest pain, palpitations and leg swelling.  Gastrointestinal:  Negative for abdominal pain, diarrhea, nausea and vomiting.  Musculoskeletal:  Positive for back pain. Negative for falls.  Neurological:  Negative for dizziness, weakness and headaches.   Physical Exam:  Vitals:   07/20/20 1055  BP: 134/74  Pulse: 69  Temp: 98.2 F (36.8 C)  TempSrc: Oral  SpO2: 100%  Weight: 209 lb 3.2 oz (94.9 kg)  Height: 5\' 2"  (1.575 m)   Physical Exam Vitals and nursing note reviewed.  Constitutional:      General: She is not in acute distress.    Appearance: She is obese.  HENT:     Head: Normocephalic and atraumatic.  Cardiovascular:     Rate and Rhythm: Normal rate and regular rhythm.     Heart sounds: No murmur heard. Pulmonary:     Effort: Pulmonary effort is normal. No respiratory distress.     Breath sounds: Normal breath sounds. No wheezing, rhonchi or rales.  Abdominal:     General: Bowel sounds are normal.     Palpations: Abdomen is soft.  Musculoskeletal:     Right lower leg: No edema.     Left lower leg: No edema.  Skin:    General: Skin is warm and dry.  Neurological:  General: No focal deficit present.     Mental Status: She is alert and oriented to person, place, and time. Mental status is at baseline.     Gait: Gait normal.  Psychiatric:        Mood and Affect: Mood normal.        Behavior: Behavior normal.        Thought Content: Thought content normal.        Judgment: Judgment normal.    Assessment & Plan:   See Encounters Tab for problem based charting.  Patient discussed with Dr. Jimmye Norman

## 2020-07-20 NOTE — Assessment & Plan Note (Signed)
>>  ASSESSMENT AND PLAN FOR PERIPHERAL NEUROPATHY WRITTEN ON 07/20/2020  3:24 PM BY Verdene Lennert, MD  Today, Ms. Bumgardner states that her current regimen controls her pain well.  She is taking Cymbalta 60 mg daily and takes gabapentin 3 times daily as needed.  Occasionally her neuropathy leads to muscle spasms and she takes Flexeril for this as needed.  She is requesting refills today.  Assessment/plan: Patient has a past medical history of polyneuropathy that is multifactorial.  Neuropathy in her right lower extremity is documented to be secondary to chronic sciatica.  She is had multiple visits in the past for upper extremity numbness and tingling.  MRI C-spine and T-spine were obtained in November 2021; remarkable findings include chronic myelomalacia in the C-spine in addition to diffuse degenerative changes throughout the C-spine including foraminal stenosis and spinal stenosis.  T-spine MRI remarkable for multilevel degenerative changes but no spinal stenosis.  No other work-up for neuropathy at this time.  Patient does follow-up with neurology for her neuromyelitis optica.  - Refilled duloxetine 60 mg daily - Refilled Flexeril 5 mg as needed - Continue gabapentin 300 mg 3 times daily as needed

## 2020-07-20 NOTE — Patient Instructions (Signed)
It was nice seeing you today! Thank you for choosing [facility] for your Primary Care.    Today we talked about:   - I have refilled your medications, including Flexeril and Duloxetine  - I have ordered a mammogram.   Your primary doctor will be in the clinic in October. Please make an appointment for that month.

## 2020-07-20 NOTE — Assessment & Plan Note (Signed)
BP: 134/74   Patient's blood pressure is well controlled at this time.  She denies any concerns or difficulty with her medication.  Her last follow-up with cardiology was in 2020, at which time he was cautioned to not overtreat patient's blood pressure.  No evidence of hypertension recently.  -Continue Coreg 25 mg twice daily, verapamil 120 mg daily, spironolactone 25 mg daily, and Lasix 40 mg daily

## 2020-07-20 NOTE — Assessment & Plan Note (Signed)
-   Mammogram ordered today - Patient states she received her first dose of Shingrix vaccine; will bring records to next appointment.

## 2020-07-27 NOTE — Progress Notes (Signed)
Internal Medicine Clinic Attending  Case discussed with Dr. Basaraba  At the time of the visit.  We reviewed the resident's history and exam and pertinent patient test results.  I agree with the assessment, diagnosis, and plan of care documented in the resident's note.  

## 2020-07-28 ENCOUNTER — Other Ambulatory Visit: Payer: Self-pay

## 2020-07-28 NOTE — Telephone Encounter (Signed)
Requesting to speak with a nurse about meds, please call pt back.  

## 2020-07-28 NOTE — Telephone Encounter (Signed)
RTC, patient states she just saw Dr. Charleen Kirks on 07/20/20.  She tried ordering a heating pad from her [employer] book for her to [name] for arthritis pain, but a [employer] rep told her she needs her doctor to call this in as a RX. She is requesting this be sent to [facility] and states it is listed as : Heating pad for shoulder and neck (by [employer]). Will forward to yellow team. Thank you, Leonard Downing

## 2020-07-29 ENCOUNTER — Other Ambulatory Visit: Payer: Self-pay | Admitting: Internal Medicine

## 2020-07-29 MED ORDER — SUNBEAM STANDARD HEATING PAD PADS: MEDICATED_PAD | 0 refills | Status: DC

## 2020-07-29 NOTE — Telephone Encounter (Signed)
Lauren & Mamie, Please see message below.  The patient told me she called [employer] and was told the physician needed to send a RX in for the heating pad.  Do one of you know if it needs to be sent as DME? Thanks [employer]

## 2020-08-04 ENCOUNTER — Other Ambulatory Visit: Payer: Self-pay | Admitting: Internal Medicine

## 2020-08-04 DIAGNOSIS — J309 Allergic rhinitis, unspecified: Secondary | ICD-10-CM

## 2020-08-09 ENCOUNTER — Ambulatory Visit
Admission: RE | Admit: 2020-08-09 | Discharge: 2020-08-09 | Disposition: A | Discharge status: Home / Self Care | Payer: Medicare HMO | Source: Ambulatory Visit | Attending: Student in an Organized Health Care Education/Training Program | Admitting: Student in an Organized Health Care Education/Training Program

## 2020-08-09 ENCOUNTER — Other Ambulatory Visit: Payer: Self-pay

## 2020-08-09 DIAGNOSIS — E2839 Other primary ovarian failure: Secondary | ICD-10-CM

## 2020-08-09 DIAGNOSIS — H469 Unspecified optic neuritis: Secondary | ICD-10-CM | POA: Diagnosis not present

## 2020-08-09 DIAGNOSIS — Z780 Asymptomatic menopausal state: Secondary | ICD-10-CM | POA: Diagnosis not present

## 2020-08-09 DIAGNOSIS — E559 Vitamin D deficiency, unspecified: Secondary | ICD-10-CM

## 2020-08-16 DIAGNOSIS — L304 Erythema intertrigo: Secondary | ICD-10-CM | POA: Diagnosis not present

## 2020-08-19 IMAGING — MR MR ORBITS WO/W CM
11 of 18 series · 28 of 48 positions shown · IV contrast (gadavist)
Comparison: Previous MRI from [DATE].

CLINICAL DATA: Initial evaluation for acute right-sided visual
changes.

EXAM:
MRI HEAD AND ORBITS WITHOUT AND WITH CONTRAST
TECHNIQUE: Multiplanar, multiecho pulse sequences of the brain and surrounding
structures were obtained without and with intravenous contrast.
Multiplanar, multiecho pulse sequences of the orbits and surrounding
structures were obtained including fat saturation techniques, before
and after intravenous contrast administration.
CONTRAST:  9 cc of Gadavist.

[Series 3: DWI · axial · 3.0mm · 0.94mm/px · z∈[-73,+55]mm · 5 of 88 slices shown (1 of 2)]
[im 1/88]
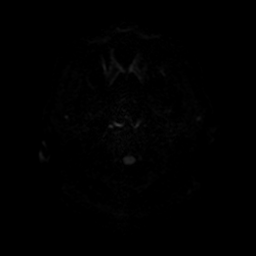
[im 22/88]
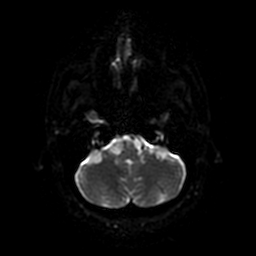
[im 44/88]
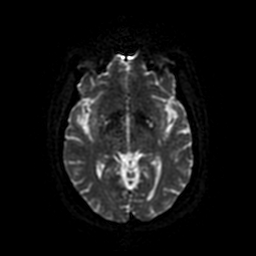
[im 66/88]
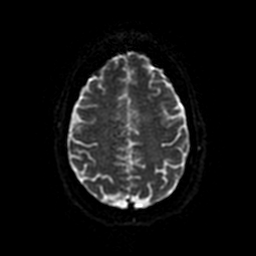
[im 88/88]
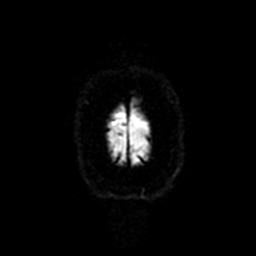

[Series 4: T2 · axial · 5.0mm · 0.47mm/px · z∈[-72,+53]mm · 2 of 22 slices shown]
[im 1/22]
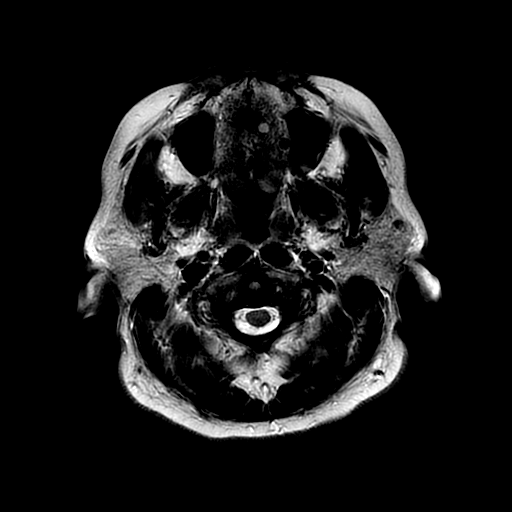
[im 22/22]
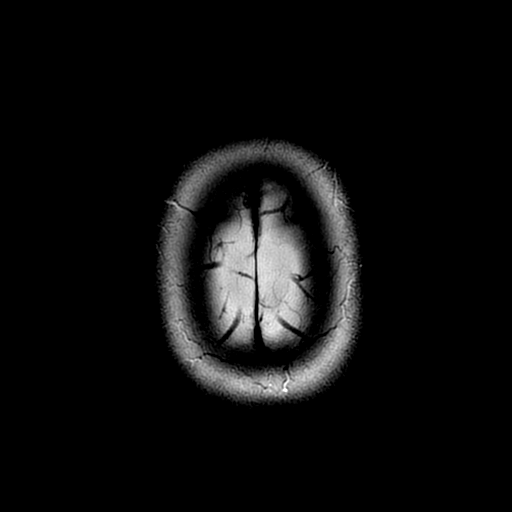

[Series 5: FLAIR · sagittal · 5.0mm · 0.47mm/px · 1 of 21 slices shown (1 of 2)]
[im 1/21]
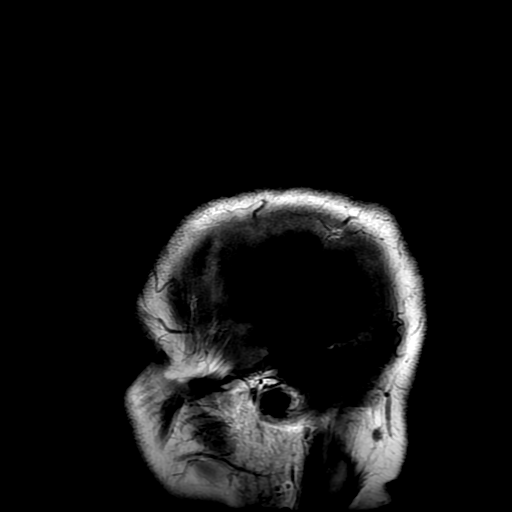

[Series 6: FLAIR · axial · 3.0mm · 0.47mm/px · z∈[-72,+53]mm · 2 of 22 slices shown (2 of 2)]
[im 1/22]
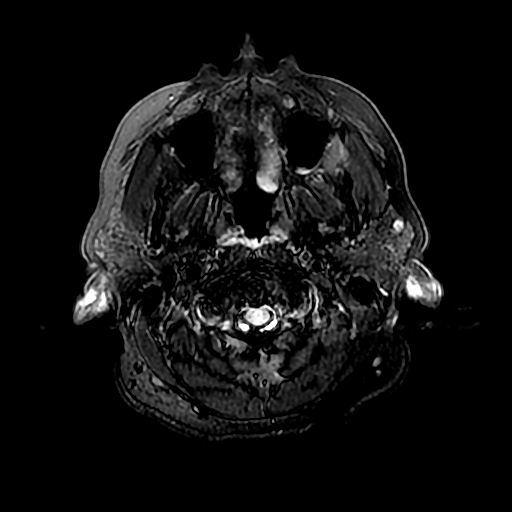
[im 22/22]
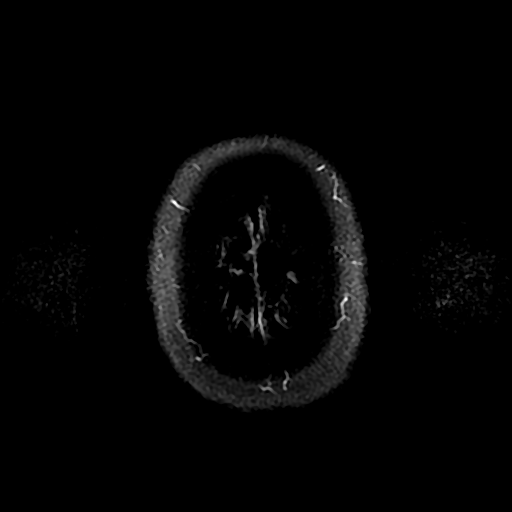

[Series 10: DWI · coronal · 4.0mm · 0.94mm/px · 5 of 66 slices shown (2 of 2)]
[im 1/66]
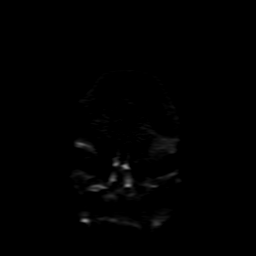
[im 17/66]
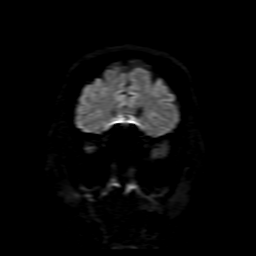
[im 33/66]
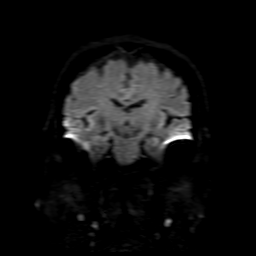
[im 49/66]
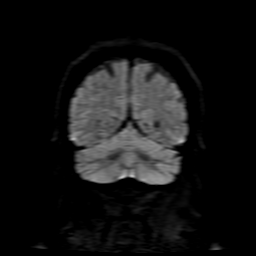
[im 66/66]
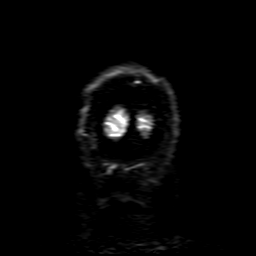

[Series 12: T1 · coronal · 4.0mm · 0.35mm/px · 2 of 26 slices shown (1 of 2)]
[im 1/26]
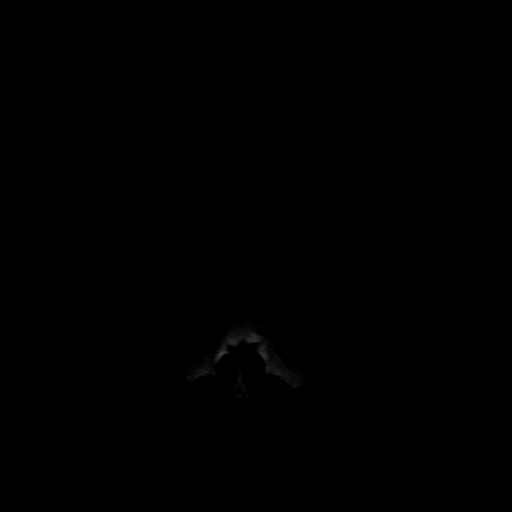
[im 26/26]
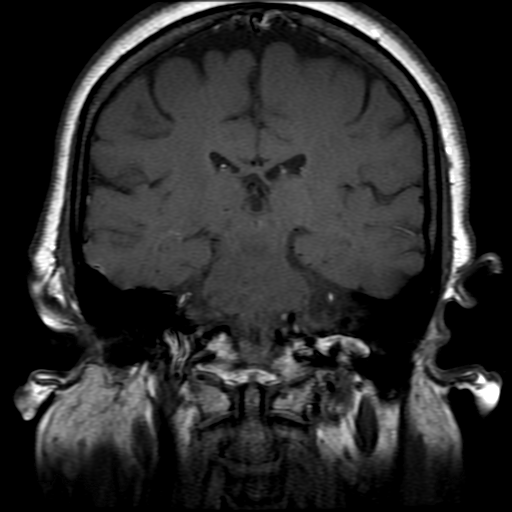

[Series 13: T1 · axial · 3.0mm · 0.35mm/px · z∈[-46,+20]mm · 2 of 23 slices shown (2 of 2)]
[im 1/23]
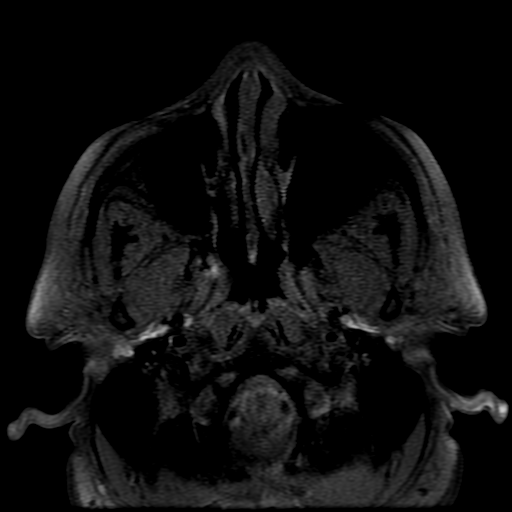
[im 23/23]
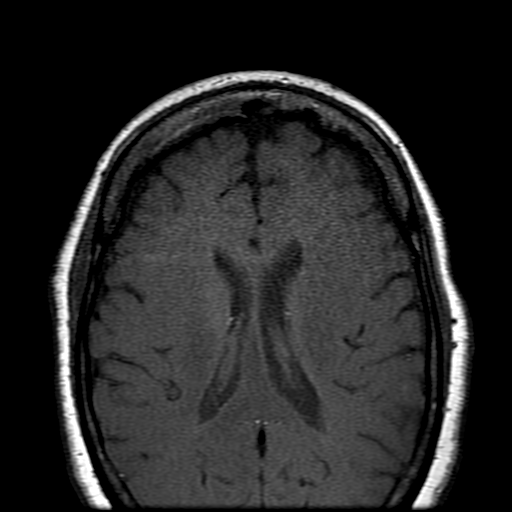

[Series 14: T2 post-contrast · coronal · 5.0mm · 0.39mm/px · 2 of 28 slices shown]
[im 1/28]
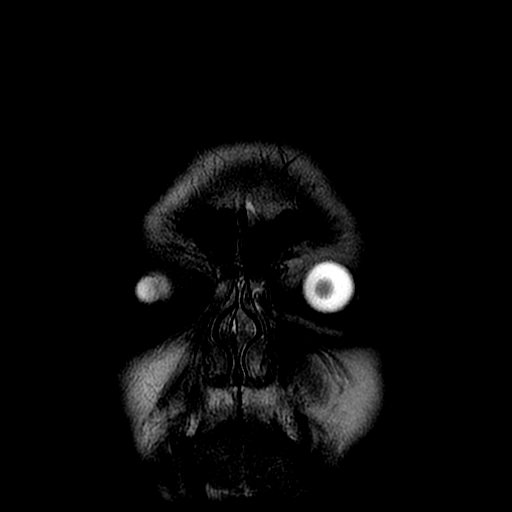
[im 28/28]
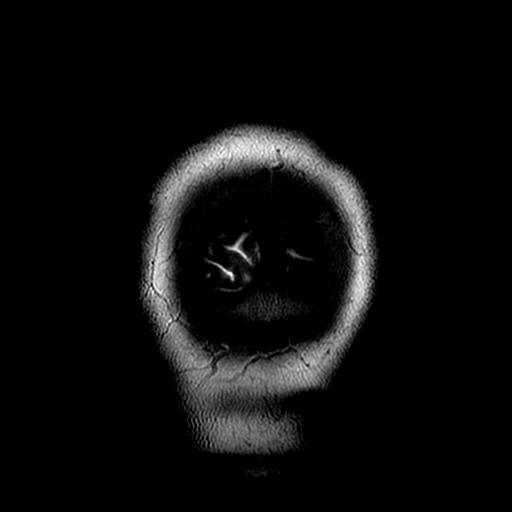

[Series 15: T1 fat-sat · coronal · 4.0mm · 0.35mm/px · 2 of 26 slices shown]
[im 1/26]
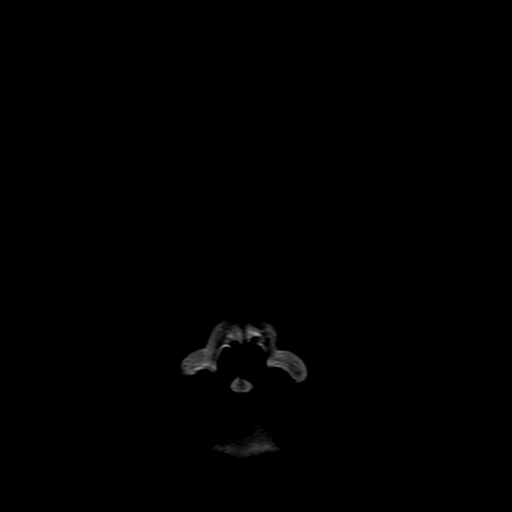
[im 26/26]
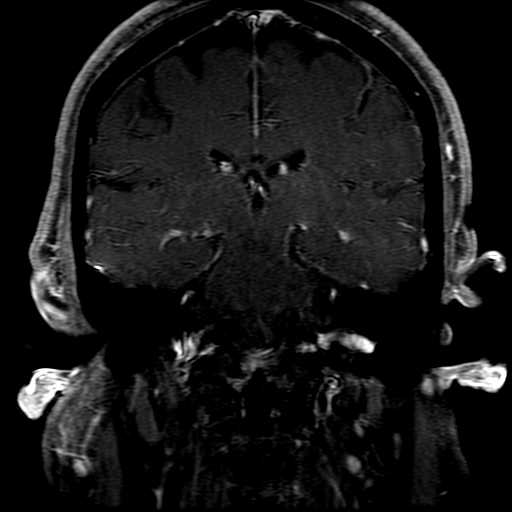

[Series 350: ADC · axial · 3.0mm · 0.94mm/px · z∈[-73,+55]mm · 3 of 44 slices shown (1 of 2)]
[im 1/44]
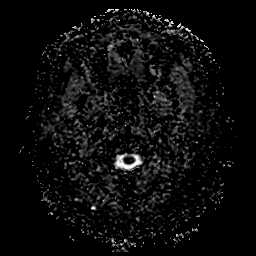
[im 22/44]
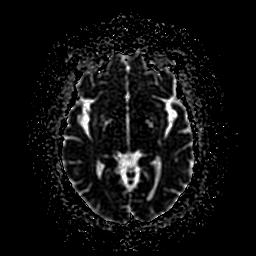
[im 44/44]
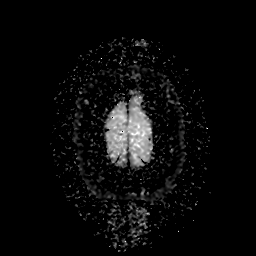

[Series 1050: ADC · coronal · 4.0mm · 0.94mm/px · 2 of 33 slices shown (2 of 2)]
[im 1/33]
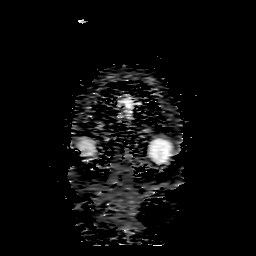
[im 33/33]
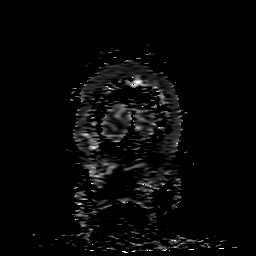

[28 of 48 positions shown; findings below may reference images not displayed]

FINDINGS: MRI HEAD FINDINGS

Brain: Cerebral volume within normal limits for age. Few scattered
subcentimeter T2/FLAIR hyperintensities noted within the
periventricular and deep white matter both cerebral hemispheres,
nonspecific. Mild chronic small vessel ischemic changes present
within the pons. No abnormal foci of restricted diffusion to suggest
acute or subacute ischemia. Gray-white matter differentiation
maintained. No encephalomalacia to suggest chronic cortical
infarction. No acute intracranial hemorrhage. Few scattered chronic
micro hemorrhages noted involving the pons and right thalamus, most
likely related to underlying hypertension.

No mass lesion, midline shift or mass effect. No hydrocephalus. No
extra-axial fluid collection. 2 G-Wizzy gland and suprasellar region
normal. Midline structures intact and normal. No abnormal
enhancement within the brain. Incidental note made of a left
cerebellar DVA.

Vascular: Major intracranial vascular flow voids are well
maintained.

Skull and upper cervical spine: Craniocervical junction normal.
Upper cervical spine within normal limits. Bone marrow signal
intensity normal. No scalp soft tissue abnormality.

Other: No mastoid effusion.  Inner ear structures grossly normal.

MRI ORBITS FINDINGS

Orbits: Globes symmetric in size with normal appearance and
morphology bilaterally. There is abnormal enhancement involving the
right optic nerve at the level of the right orbital apex (series 16,
image 10). Finding also seen on coronal sequence series 15, image
14). The nerve itself is fairly symmetric in size with the
contralateral left. Findings suggest acute optic neuritis. No
abnormality about the left optic nerve. Optic chiasm within normal
limits. No abnormality along the visualized optic radiations.

Extra-ocular muscles symmetric and normal. Intraconal and extraconal
fat well-maintained. Superior orbital veins within normal limits.
Lacrimal glands normal.

Visualized sinuses: Clear.

Soft tissues: Unremarkable.

Limited intracranial: Unremarkable.
IMPRESSION: 1. Abnormal enhancement involving the right optic nerve as above,
suggesting acute optic neuritis.
2. No other acute intracranial abnormality.
3. Mild chronic small vessel ischemic disease for age.
4. Multiple scattered chronic micro hemorrhages involving the right
thalamus and pons, most likely related to chronic underlying
hypertension.

## 2020-08-23 DIAGNOSIS — H469 Unspecified optic neuritis: Secondary | ICD-10-CM | POA: Diagnosis not present

## 2020-08-26 ENCOUNTER — Other Ambulatory Visit: Payer: Self-pay

## 2020-08-26 ENCOUNTER — Telehealth: Payer: Self-pay | Admitting: *Deleted

## 2020-08-26 ENCOUNTER — Ambulatory Visit (INDEPENDENT_AMBULATORY_CARE_PROVIDER_SITE_OTHER): Payer: Medicare HMO | Admitting: Internal Medicine

## 2020-08-26 ENCOUNTER — Encounter: Payer: Self-pay | Admitting: Internal Medicine

## 2020-08-26 VITALS — BP 131/85 | HR 69 | Temp 99.3°F | Ht 62.0 in | Wt 200.3 lb

## 2020-08-26 DIAGNOSIS — J22 Unspecified acute lower respiratory infection: Secondary | ICD-10-CM | POA: Diagnosis not present

## 2020-08-26 NOTE — Assessment & Plan Note (Addendum)
Patient diagnosed with pneumonitis optica approximately 2 years ago and is on rituximab infusions every 6 months.  Her last infusions were on August 1 in August 15. She began to have headaches after the first infusion was run to fast. Headaches were treated for 3 days treated with tylenol. On the Friday after her infusion she started to have a cough which was productive of some green sputum.  She is continue to have a cough and it clear-colored now.  Cough has improved but patient is continue to feel very tired.  She never had similar symptoms with prior rituximab infusions.  She has not had a fever at home and denies other localizing infection symptoms.  Elevated temperature to 99.3 today.   Assessment/Plan: Acute lower respiratory infection, it is possible patient has had or is infected with COVID-19.  She has had 2 vaccines and 1 booster.  We will also check blood work and BMP.  Patient given return precautions if she does not continue to improve.

## 2020-08-26 NOTE — Progress Notes (Signed)
   CC: acute lower respiratory infection  HPI:Michelle Hurley is a 70 y.o. female who presents for evaluation of acute lower respiratory infection. Please see individual problem based A/P for details.    Past Medical History:  Diagnosis Date   Abdominal pain 05/22/2014   Allergic rhinitis 04/16/2015   Anxiety state, unspecified    ARTHRALGIA UNSPECIFIED SITE 12/04/2008   Qualifier: Diagnosis of  By: Melvyn Novas MD, Christena Deem    Arthritis    Atherosclerosis of abdominal aorta ([place]) 10/01/2015   Aortoiliac atherosclerosis noted on CT abdomen in 2016.   Atypical chest pain 12/11/2018   Cough with congestion of paranasal sinus 06/01/2015   Dizziness 04/06/2012   Essential hypertension 12/24/2006   Estrogen deficiency 11/16/2016   GERD (gastroesophageal reflux disease) 08/07/2018   H/O: hysterectomy    Hx-TIA (transient ischemic attack) 04/06/2012   Hypertension    Hypokalemia 02/12/2018   Influenza-like illness 02/02/2016   Loss of part of visual field    Morbid obesity ([place])    Myopia    Nausea with vomiting 05/23/2014   Neuromyelitis optica ([place]) 01/22/2018   NSVT (nonsustained ventricular tachycardia) ([place])    a. 01/2005 Echo: EF 55-65%   Osteopenia    Other and unspecified hyperlipidemia    Palpitations    Plantar fasciitis, bilateral 06/12/2016   Plantar fasciitis, bilateral 06/12/2016   Rash 03/23/2017   Right ankle injury, initial encounter 06/12/2016   Right elbow pain 06/11/2017   Right leg pain 09/06/2016   Right optic neuritis 01/22/2018   Sciatica 01/15/2018   Shoulder pain, bilateral (Concern for CAD) 08/28/2018   Shoulder pain, bilateral (Concern for CAD) 123456   Umbilical hernia    Unspecified essential hypertension    Vaginal candidiasis 02/12/2018   VENTRICULAR TACHYCARDIA 12/04/2008   F/u  lhc Hochrein     Viral upper respiratory tract infection 01/15/2018   Vitamin D deficiency 02/12/2018   Review of Systems:   Review of Systems  Constitutional:  Positive for malaise/fatigue.  Negative for fever.  Respiratory:  Positive for cough and sputum production. Negative for shortness of breath and wheezing.   Gastrointestinal:  Negative for abdominal pain, constipation and diarrhea.    Physical Exam: Vitals:   08/26/20 1357  BP: 131/85  Pulse: 69  Temp: 99.3 F (37.4 C)  TempSrc: Oral  SpO2: 98%  Weight: 200 lb 4.8 oz (90.9 kg)  Height: '5\' 2"'$  (1.575 m)   General: Obese, NAD, appears fatigued  HEENT: Conjunctiva nl , antiicteric sclerae, moist mucous membranes, no exudate or erythema Cardiovascular: Normal rate, regular rhythm.  No murmurs, rubs, or gallops Pulmonary : Equal breath sounds, No wheezes, rales, or rhonchi Abdominal: soft, nontender,  bowel sounds present Ext: No edema in lower extremities, no tenderness to palpation of lower extremities.   Assessment & Plan:   See Encounters Tab for problem based charting.  Patient discussed with Dr. Dareen Piano

## 2020-08-26 NOTE — Patient Instructions (Signed)
Thank you for trusting me with your care. To recap, today we discussed the following:   1. Acute lower respiratory infection - Novel Coronavirus, NAA ([facility]) - CBC with Diff - BMP8+Anion Gap  I will call you when the results return.

## 2020-08-26 NOTE — Telephone Encounter (Signed)
Patient called in c/o lethargy, no appetite, cough x 2 weeks. Fully vaccinated for Covid. Appt given this PM with Yellow Team.

## 2020-08-27 ENCOUNTER — Encounter: Payer: Self-pay | Admitting: Internal Medicine

## 2020-08-27 LAB — CBC WITH DIFFERENTIAL/PLATELET
Basophils Absolute: 0 10*3/uL (ref 0.0–0.2)
Basos: 0 %
EOS (ABSOLUTE): 0.1 10*3/uL (ref 0.0–0.4)
Eos: 2 %
Hematocrit: 43.9 % (ref 34.0–46.6)
Hemoglobin: 14.4 g/dL (ref 11.1–15.9)
Immature Grans (Abs): 0 10*3/uL (ref 0.0–0.1)
Immature Granulocytes: 1 %
Lymphocytes Absolute: 0.7 10*3/uL (ref 0.7–3.1)
Lymphs: 20 %
MCH: 26.9 pg (ref 26.6–33.0)
MCHC: 32.8 g/dL (ref 31.5–35.7)
MCV: 82 fL (ref 79–97)
Monocytes Absolute: 0.8 10*3/uL (ref 0.1–0.9)
Monocytes: 25 %
Neutrophils Absolute: 1.8 10*3/uL (ref 1.4–7.0)
Neutrophils: 52 %
Platelets: 308 10*3/uL (ref 150–450)
RBC: 5.36 x10E6/uL — ABNORMAL HIGH (ref 3.77–5.28)
RDW: 13.8 % (ref 11.7–15.4)
WBC: 3.4 10*3/uL (ref 3.4–10.8)

## 2020-08-27 LAB — BMP8+ANION GAP
Anion Gap: 17 mmol/L (ref 10.0–18.0)
BUN/Creatinine Ratio: 16 (ref 12–28)
BUN: 14 mg/dL (ref 8–27)
CO2: 21 mmol/L (ref 20–29)
Calcium: 9.4 mg/dL (ref 8.7–10.3)
Chloride: 99 mmol/L (ref 96–106)
Creatinine, Ser: 0.89 mg/dL (ref 0.57–1.00)
Glucose: 89 mg/dL (ref 65–99)
Potassium: 4.1 mmol/L (ref 3.5–5.2)
Sodium: 137 mmol/L (ref 134–144)
eGFR: 69 mL/min/{1.73_m2} (ref >59)

## 2020-08-28 LAB — NOVEL CORONAVIRUS, NAA

## 2020-08-30 ENCOUNTER — Telehealth: Payer: Self-pay | Admitting: Internal Medicine

## 2020-08-30 DIAGNOSIS — J22 Unspecified acute lower respiratory infection: Secondary | ICD-10-CM

## 2020-08-30 MED ORDER — DM-GUAIFENESIN ER 30-600 MG PO TB12: 1.0000 | ORAL_TABLET | Freq: Two times a day (BID) | ORAL | 0 refills | Status: DC | PRN

## 2020-08-30 NOTE — Progress Notes (Signed)
Internal Medicine Clinic Attending  Case discussed with Dr. Steen  At the time of the visit.  We reviewed the resident's history and exam and pertinent patient test results.  I agree with the assessment, diagnosis, and plan of care documented in the resident's note.  

## 2020-08-30 NOTE — Telephone Encounter (Signed)
Patient updated on negative result for Covid and nl labs. She continues to have sputum production and mild shortness of breath when laying down. No fever. Will send for chest xray and prescribed Mucinex.

## 2020-09-01 ENCOUNTER — Other Ambulatory Visit: Payer: Self-pay

## 2020-09-01 ENCOUNTER — Ambulatory Visit (HOSPITAL_COMMUNITY)
Admission: RE | Admit: 2020-09-01 | Discharge: 2020-09-01 | Disposition: A | Discharge status: Home / Self Care | Payer: Medicare HMO | Source: Ambulatory Visit | Attending: Internal Medicine | Admitting: Internal Medicine

## 2020-09-01 DIAGNOSIS — J22 Unspecified acute lower respiratory infection: Secondary | ICD-10-CM | POA: Insufficient documentation

## 2020-09-01 DIAGNOSIS — R0600 Dyspnea, unspecified: Secondary | ICD-10-CM | POA: Diagnosis not present

## 2020-09-01 DIAGNOSIS — R059 Cough, unspecified: Secondary | ICD-10-CM | POA: Diagnosis not present

## 2020-09-02 ENCOUNTER — Other Ambulatory Visit: Payer: Self-pay | Admitting: Student

## 2020-09-02 DIAGNOSIS — E7800 Pure hypercholesterolemia, unspecified: Secondary | ICD-10-CM

## 2020-09-21 ENCOUNTER — Telehealth: Payer: Self-pay | Admitting: *Deleted

## 2020-09-21 ENCOUNTER — Ambulatory Visit (INDEPENDENT_AMBULATORY_CARE_PROVIDER_SITE_OTHER): Payer: Medicare HMO | Admitting: Internal Medicine

## 2020-09-21 DIAGNOSIS — J22 Unspecified acute lower respiratory infection: Secondary | ICD-10-CM

## 2020-09-21 MED ORDER — DM-GUAIFENESIN ER 30-600 MG PO TB12: 1.0000 | ORAL_TABLET | Freq: Two times a day (BID) | ORAL | 0 refills | Status: DC | PRN

## 2020-09-21 NOTE — Telephone Encounter (Signed)
Unfortunately, her insurance may not cover cough medicines. Please have her come into the office tomorrow and we will see her.

## 2020-09-21 NOTE — Telephone Encounter (Signed)
Call from pt who stated the doctor ordered her med for her cough. She called the pharmacy who told her it's (Mucinex Dm) OTC med and her insurance will not pay. Pt stated she does not have any money at all - wants the doctor to order something that her insurance will pay for. Thanks

## 2020-09-21 NOTE — Progress Notes (Signed)
  [facility] Internal Medicine Residency Telephone Encounter Continuity Care Appointment  HPI:  This telephone encounter was created for [name] on 09/21/2020 for persistent productive cough. Please refer to problem based charting for assessment and plan    Assessment / Plan / Recommendations:  Problem List Items Addressed This Visit       Respiratory   Acute lower respiratory infection    HPI: This telehealth visit was follow up from her LOV with Dr. Court Joy 8/18 at which time she was diagnosed with a lower respiratory tract infection.  She notes persistent cough since then that hasn't significantly improved and requests mucinex refill. She notes that the cough is fairly persistent throughout the day but is significantly worse at night when laying down, resulting in her having to sleep sitting up. Cough is productive of clear frothy mucous. She denies dyspnea or orthopnea.  She weighed herself while on the phone with me. Wt from last OV 200lb. Wt on her scale today is 205 which she attributes to eating better.  Denies fevers, chills or chest pain. She has been compliant with lasix. Assessment:  DDX includes post viral cough, bronchitis, CHF exacerbation, rituximab reaction. Low suspicion for bacterial pneumonia in the absence of other symptoms. Given the length of time since onset, I would expect greater improvement of symptoms. Difficult to assess for CHF exacerbation via telehealth although the worsened cough with laying down and the frothy mucous production is concerning for this. Difficult to ascertain the accuracy of her weight at home.  I discussed by concern for CHF exacerbation with her. She strongly feels that her symptoms are related to the prior respiratory infection and would like to try mucinex DM again tonight. I explained to her that if her symptoms do not significantly improve with that, I would like her to be seen in the clinic for evaluation. Adverse respiratory  reactions to rutuximab, based on uptodate, include bronchitis, cough (13% to 15%), nasopharyngitis (?16%), pulmonary disease (31%), pulmonary toxicity (18%), rhinitis (3% to 12%)   I do not think she needs immediate evaluation as she is not experiencing significant shortness of breath and she was able to speak in full sentences easily without needing to stop for breath.  Plan Mucinex DM sent to pharmacy She will follow up in the office tomorrow if symptoms do not significant improve tonight. Would consider repeat CXR  If other etiologies are ruled out, will need to discuss with her neurologist who prescribes the rutuximab        As always, pt is advised that if symptoms worsen or new symptoms arise, they should go to an urgent care facility or to to ER for further evaluation.   Consent and Medical Decision Making:  Patient discussed with Dr.  Jimmye Norman This is a telephone encounter between Michelle Hurley and [place] on 09/21/2020 for cough. The visit was conducted with the patient located at home and [employer] at [facility]. The patient's identity was confirmed using their DOB and current address. The patient has consented to being evaluated through a telephone encounter and understands the associated risks (an examination cannot be done and the patient may need to come in for an appointment) / benefits (allows the patient to remain at home, decreasing exposure to coronavirus). I personally spent 30 minutes on medical discussion.     Mitzi Hansen, MD Internal Medicine Resident PGY-3 [facility] 09/21/2020 9:43 AM

## 2020-09-21 NOTE — Assessment & Plan Note (Addendum)
HPI: This telehealth visit was follow up from her LOV with Dr. Court Joy 8/18 at which time she was diagnosed with a lower respiratory tract infection.  She notes persistent cough since then that hasn't significantly improved and requests mucinex refill. She notes that the cough is fairly persistent throughout the day but is significantly worse at night when laying down, resulting in her having to sleep sitting up. Cough is productive of clear frothy mucous. She denies dyspnea or orthopnea.  She weighed herself while on the phone with me. Wt from last OV 200lb. Wt on her scale today is 205 which she attributes to eating better.  Denies fevers, chills or chest pain. She has been compliant with lasix. Assessment:  DDX includes post viral cough, bronchitis, CHF exacerbation, rituximab reaction. Low suspicion for bacterial pneumonia in the absence of other symptoms. Given the length of time since onset, I would expect greater improvement of symptoms. Difficult to assess for CHF exacerbation via telehealth although the worsened cough with laying down and the frothy mucous production is concerning for this. Difficult to ascertain the accuracy of her weight at home.  I discussed by concern for CHF exacerbation with her. She strongly feels that her symptoms are related to the prior respiratory infection and would like to try mucinex DM again tonight. I explained to her that if her symptoms do not significantly improve with that, I would like her to be seen in the clinic for evaluation. Adverse respiratory reactions to rutuximab, based on uptodate, include bronchitis, cough (13% to 15%), nasopharyngitis (?16%), pulmonary disease (31%), pulmonary toxicity (18%), rhinitis (3% to 12%)   I do not think she needs immediate evaluation as she is not experiencing significant shortness of breath and she was able to speak in full sentences easily without needing to stop for breath.  Plan  Mucinex DM sent to pharmacy  She  will follow up in the office tomorrow if symptoms do not significant improve tonight. Would consider repeat CXR   If other etiologies are ruled out, will need to discuss with her neurologist who prescribes the rutuximab

## 2020-09-22 NOTE — Telephone Encounter (Signed)
Excellent, glad to hear it!

## 2020-09-22 NOTE — Telephone Encounter (Signed)
Called pt - informed of Dr Chase Picket response. Pt stated last night she prayed, went to bed and she slept all night, she did not cough at all. Stated she will call back to schedule an appt when needed.

## 2020-09-30 ENCOUNTER — Ambulatory Visit
Admission: RE | Admit: 2020-09-30 | Discharge: 2020-09-30 | Disposition: A | Discharge status: Home / Self Care | Payer: Medicare HMO | Source: Ambulatory Visit | Attending: Internal Medicine | Admitting: Internal Medicine

## 2020-09-30 ENCOUNTER — Other Ambulatory Visit: Payer: Self-pay

## 2020-09-30 DIAGNOSIS — Z1231 Encounter for screening mammogram for malignant neoplasm of breast: Secondary | ICD-10-CM | POA: Diagnosis not present

## 2020-10-04 ENCOUNTER — Ambulatory Visit: Payer: Medicare HMO

## 2020-10-13 ENCOUNTER — Encounter: Payer: Self-pay | Admitting: *Deleted

## 2020-10-13 NOTE — Progress Notes (Signed)

## 2020-10-13 NOTE — Progress Notes (Unsigned)

## 2020-10-15 ENCOUNTER — Other Ambulatory Visit: Payer: Self-pay | Admitting: Internal Medicine

## 2020-10-15 DIAGNOSIS — M549 Dorsalgia, unspecified: Secondary | ICD-10-CM

## 2020-11-23 ENCOUNTER — Encounter: Payer: Self-pay | Admitting: Internal Medicine

## 2020-11-23 ENCOUNTER — Ambulatory Visit (INDEPENDENT_AMBULATORY_CARE_PROVIDER_SITE_OTHER): Payer: Medicare HMO | Admitting: Internal Medicine

## 2020-11-23 ENCOUNTER — Other Ambulatory Visit: Payer: Self-pay

## 2020-11-23 ENCOUNTER — Ambulatory Visit (HOSPITAL_COMMUNITY)
Admission: RE | Admit: 2020-11-23 | Discharge: 2020-11-23 | Disposition: A | Discharge status: Home / Self Care | Payer: Medicare HMO | Source: Ambulatory Visit | Attending: Internal Medicine | Admitting: Internal Medicine

## 2020-11-23 VITALS — BP 145/74 | HR 80 | Temp 97.8°F | Ht 62.0 in | Wt 210.2 lb

## 2020-11-23 DIAGNOSIS — M79642 Pain in left hand: Secondary | ICD-10-CM

## 2020-11-23 DIAGNOSIS — M79641 Pain in right hand: Secondary | ICD-10-CM | POA: Diagnosis not present

## 2020-11-23 DIAGNOSIS — M25742 Osteophyte, left hand: Secondary | ICD-10-CM | POA: Diagnosis not present

## 2020-11-23 DIAGNOSIS — M19042 Primary osteoarthritis, left hand: Secondary | ICD-10-CM | POA: Diagnosis not present

## 2020-11-23 DIAGNOSIS — G8929 Other chronic pain: Secondary | ICD-10-CM | POA: Diagnosis not present

## 2020-11-23 DIAGNOSIS — M19041 Primary osteoarthritis, right hand: Secondary | ICD-10-CM | POA: Diagnosis not present

## 2020-11-23 NOTE — Patient Instructions (Signed)
It was nice seeing you today! Thank you for choosing [facility] for your Primary Care.    Today we talked about:   Hand Pain: This is most likely caused by osteoarthritis. However next time you experience sudden onset swelling with redness, I recommend making an appointment to make sure gout is not playing a role as well.  I have ordered an Xray of your right hand. For pain control:  Tylenol 650 mg every 6 - 8 hours  Aleve 250 mg every 6-8 hours (however, do not take for more than a few days)   Follow up in 4-6 weeks regarding chronic medical problems.

## 2020-11-23 NOTE — Assessment & Plan Note (Signed)
Michelle Hurley endorses a 4 to 33-month history of bilateral hand pain that is significantly worse in her right middle finger. She notes that the pain is worse at the end of the day after she is finished all she needs to do.  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  Occasionally, she will wake up in the morning and her right middle finger will be swollen and red with increased tenderness compared to the rest of her fingers.  She denies any history of gout or inflammatory arthralgia.  She denies any traumas to her hands.  Assessment/plan: Highest on the differential is osteoarthritis, however sudden onset episodes of edema with erythema is concerning for gout versus inflammatory arthritis.  Counseled that she should return to the clinic when she has another flare so we can evaluate and consider perform an arthrocentesis for crystal evaluation.  She expressed understanding.  For now we will obtain bilateral hand x-rays and treat pain conservatively with over-the-counter Tylenol and Aleve.  - Bilateral hand x-rays pending - Tylenol 650 mg every 6-8 hours as needed - Aleve 250 mg every 6-8 hours as needed - Follow-up during next flare

## 2020-11-23 NOTE — Progress Notes (Signed)
CC: Bilateral hand pain  HPI:  Michelle Hurley is a 70 y.o. with a PMHx as listed below who presents to the clinic for bilateral hand pain.   Please see the Encounters tab for problem-based Assessment & Plan regarding status of patient's acute and chronic conditions.  Past Medical History:  Diagnosis Date  . Abdominal pain 05/22/2014  . Allergic rhinitis 04/16/2015  . Anxiety state, unspecified   . ARTHRALGIA UNSPECIFIED SITE 12/04/2008   Qualifier: Diagnosis of  By: Melvyn Novas MD, Christena Deem   . Arthritis   . Atherosclerosis of abdominal aorta ([facility]) 10/01/2015   Aortoiliac atherosclerosis noted on CT abdomen in 2016.  Marland Kitchen Atypical chest pain 12/11/2018  . Cough with congestion of paranasal sinus 06/01/2015  . Dizziness 04/06/2012  . Essential hypertension 12/24/2006  . Estrogen deficiency 11/16/2016  . GERD (gastroesophageal reflux disease) 08/07/2018  . H/O: hysterectomy   . Hx-TIA (transient ischemic attack) 04/06/2012  . Hypertension   . Hypokalemia 02/12/2018  . Influenza-like illness 02/02/2016  . Loss of part of visual field   . Morbid obesity ([place])   . Myopia   . Nausea with vomiting 05/23/2014  . 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 NSVT (nonsustained ventricular tachycardia)    a. 01/2005 Echo: EF 55-65%  . Osteopenia   . Other and unspecified hyperlipidemia   . Palpitations   . Plantar fasciitis, bilateral 06/12/2016  . Plantar fasciitis, bilateral 06/12/2016  . Rash 03/23/2017  . Right ankle injury, initial encounter 06/12/2016  . Right elbow pain 06/11/2017  . Right leg pain 09/06/2016  . Right optic neuritis 01/22/2018  . Sciatica 01/15/2018  . Shoulder pain, bilateral (Concern for CAD) 08/28/2018  . Shoulder pain, bilateral (Concern for CAD) 08/28/2018  . Umbilical hernia   . Unspecified essential hypertension   . Vaginal candidiasis 02/12/2018  . VENTRICULAR TACHYCARDIA 12/04/2008   F/u  [facility]    . Viral upper respiratory tract infection 01/15/2018  . Vitamin D deficiency  02/12/2018   Review of Systems: Review of Systems  Constitutional:  Negative for chills and fever.  Musculoskeletal:  Positive for joint pain. Negative for back pain, falls, myalgias and neck pain.  Neurological:  Positive for tingling (right middle finger, distal end). Negative for focal weakness and weakness.   Physical Exam:  Vitals:   11/23/20 1359  BP: (!) 145/74  Pulse: 80  Temp: 97.8 F (36.6 C)  TempSrc: Oral  SpO2: 99%  Weight: 210 lb 3.2 oz (95.3 kg)  Height: 5\' 2"  (1.575 m)   Physical Exam Vitals and nursing note reviewed.  Constitutional:      General: She is not in acute distress.    Appearance: She is obese.  HENT:     Head: Normocephalic and atraumatic.  Pulmonary:     Effort: Pulmonary effort is normal. No respiratory distress.  Musculoskeletal:     Comments: Bilateral digits with no acute abnormalities including joint edema, erythema or enlargement.  Range of motion intact.  No wrist abnormalities noted today; intact full range of motion without pain.  Negative Tinel's and Phalen's.  Skin:    General: Skin is warm and dry.     Findings: No erythema, lesion or rash.  Neurological:     General: No focal deficit present.     Mental Status: She is alert and oriented to person, place, and time.     Sensory: Sensation is intact.     Motor: No tremor, atrophy or abnormal muscle tone.  Comments: Bilateral upper extremity with 5 out of 5 strength, including all fingers.    Assessment & Plan:   See Encounters Tab for problem based charting.  Patient discussed with Dr.  Saverio Danker

## 2020-11-25 NOTE — Progress Notes (Signed)
Xrays consistent with progressing osteoarthritis. No evidence of inflammatory changes.

## 2020-11-26 NOTE — Progress Notes (Signed)
Internal Medicine Clinic Attending  Case discussed with Dr. Basaraba  At the time of the visit.  We reviewed the resident's history and exam and pertinent patient test results.  I agree with the assessment, diagnosis, and plan of care documented in the resident's note.  

## 2020-12-07 ENCOUNTER — Telehealth: Payer: Self-pay | Admitting: *Deleted

## 2020-12-07 NOTE — Telephone Encounter (Signed)
Per Graylon Gunning RN, infusion patient is due next Rituxan in Jan. She needs updated office note, last seen 10/2019. I called patient and scheduled her for FU 12/15/20. She  verbalized understanding, appreciation.

## 2020-12-13 ENCOUNTER — Other Ambulatory Visit: Payer: Self-pay | Admitting: Internal Medicine

## 2020-12-13 DIAGNOSIS — E7800 Pure hypercholesterolemia, unspecified: Secondary | ICD-10-CM

## 2020-12-15 ENCOUNTER — Ambulatory Visit (INDEPENDENT_AMBULATORY_CARE_PROVIDER_SITE_OTHER): Payer: Medicare HMO | Admitting: Diagnostic Neuroimaging

## 2020-12-15 DIAGNOSIS — M549 Dorsalgia, unspecified: Secondary | ICD-10-CM | POA: Diagnosis not present

## 2020-12-15 DIAGNOSIS — G360 Neuromyelitis optica [Devic]: Secondary | ICD-10-CM

## 2020-12-16 ENCOUNTER — Encounter: Payer: Self-pay | Admitting: Diagnostic Neuroimaging

## 2020-12-16 MED ORDER — GABAPENTIN 300 MG PO CAPS: 300.0000 mg | ORAL_CAPSULE | Freq: Three times a day (TID) | ORAL | 4 refills | Status: DC

## 2020-12-16 NOTE — Progress Notes (Signed)
[facility]  PATIENT: Michelle Hurley DOB: 19-Aug-1950  REFERRING CLINICIAN: ER  HISTORY FROM: patient and chart REASON FOR VISIT: follow up   HISTORICAL  CHIEF COMPLAINT:  Chief Complaint  Patient presents with   neuromyelitis optica follow up     HISTORY OF PRESENT ILLNESS:   UPDATE (12/15/20, VRP): Since last visit, doing well. Symptoms are stable. Pain is improved. No alleviating or aggravating factors. Tolerating rituximab.    UPDATE (10/22/2019, VRP): Since last visit, doing well until 3 to 4 weeks ago when she developed right thoracic and flank pain, numbness and tingling in her right axillary region and right triceps region.  Patient tried muscle relaxers from PCP without relief.  Patient tried over-the-counter topical pain relievers without relief.  Patient has had MRI of the brain, cervical and thoracic spine ordered and scheduled for the next few weeks.  No other triggering factors.  Some tenderness to palpation just below her shoulder blade on the right side.  Vision stable.  Lower extremity stable.  Left arm stable.  UPDATE (05/19/19, VRP): Since last visit, doing well. Had [place] vaccine in March / April 2021; doing well. Last rituxan infusion in Jan 2021. Symptoms are stable. No alleviating or aggravating factors.     UPDATE (11/18/18, VRP): Since last visit, doing well. Symptoms are improved. Right eye vision almost back to normal. No alleviating or aggravating factors. Tolerating rituxan; next dose in Dec 2020.   VIDEO VISIT UPDATE (05/15/18, VRP):  - overall feels well - vision is improved - no pain - waiting on insurance approval for rituxan  PRIOR HPI (01/30/18): 70 year old female with hypertension, here for evaluation of right retrobulbar, idiopathic optic neuritis.  01/22/2018 patient presented to the hospital after seeing her ophthalmologist for new onset right eye vision loss.  She reported an altitudinal superior visual field defect  with some pain.  No problems in the left eye.  Upon admission patient had MRI of the brain and orbits which showed enhancing right optic nerve lesion.  Mild chronic small vessel ischemic disease was also noted as well as scattered chronic cerebral microhemorrhages.  Patient was diagnosed with idiopathic optic neuritis.  She was treated with IV Solu-Medrol.  She had some side effects of hallucinations and irritability.  She completed her course and was discharged home.  Since that time symptoms are continuing to improve.  No problems with arms, legs, balance, bowel or bladder dysfunction.   REVIEW OF SYSTEMS: Full 14 system review of systems performed and negative with exception of: as per HPI.   ALLERGIES: Allergies  Allergen Reactions   Mushroom Extract Complex Anaphylaxis, Itching and Swelling    Swelling all over body    Codeine Nausea And Vomiting   Hydrocodone Nausea And Vomiting   Prednisone Nausea And Vomiting   Omeprazole Itching    HOME MEDICATIONS: Outpatient Medications Prior to Visit  Medication Sig Dispense Refill   levocetirizine (XYZAL) 5 MG tablet TAKE 1 TABLET EVERY EVENING 90 tablet 1   lovastatin (MEVACOR) 20 MG tablet TAKE 1 TABLET AT BEDTIME 90 tablet 3   acetaminophen (TYLENOL) 500 MG tablet Take 500 mg by mouth every 6 (six) hours as needed.     aspirin EC 81 MG tablet Take 81 mg by mouth daily. Swallow whole.     calcium-vitamin D 250-100 MG-UNIT tablet Take 1 tablet by mouth 2 (two) times daily. 30 tablet 3   carvedilol (COREG) 25 MG tablet TAKE 1 TABLET TWICE DAILY WITH MEALS 180  tablet 1   cyclobenzaprine (FLEXERIL) 5 MG tablet TAKE 1 TABLET THREE TIMES DAILY AS NEEDED FOR MUSCLE SPASM(S) 30 tablet 1   dextromethorphan-guaiFENesin (MUCINEX DM) 30-600 MG 12hr tablet Take 1 tablet by mouth 2 (two) times daily as needed for cough. 30 tablet 0   DULoxetine (CYMBALTA) 60 MG capsule Take 1 capsule (60 mg total) by mouth daily. 90 capsule 0   furosemide (LASIX) 40  MG tablet TAKE 1 TABLET EVERY DAY 90 tablet 1   Heating Pads (SUNBEAM STANDARD HEATING PAD) PADS Please use as directed on the instructions included. 1 each 0   polyethylene glycol powder (GLYCOLAX/MIRALAX) powder Take 17 g by mouth daily as needed for moderate constipation or severe constipation. 255 g 0   RITUXAN 500 MG/50ML injection 05/14/18 not started yet     spironolactone (ALDACTONE) 25 MG tablet TAKE 1 TABLET EVERY DAY 90 tablet 1   verapamil (CALAN) 120 MG tablet TAKE 1/2 TABLET THREE TIMES DAILY 135 tablet 3   gabapentin (NEURONTIN) 300 MG capsule Take 1 capsule (300 mg total) by mouth 3 (three) times daily. 60 capsule 11   No facility-administered medications prior to visit.    PHYSICAL EXAM  GENERAL EXAM/CONSTITUTIONAL: Vitals:  There were no vitals filed for this visit.  There is no height or weight on file to calculate BMI. Wt Readings from Last 3 Encounters:  11/23/20 210 lb 3.2 oz (95.3 kg)  08/26/20 200 lb 4.8 oz (90.9 kg)  07/20/20 209 lb 3.2 oz (94.9 kg)   No results found.  Patient is in no distress; well developed, nourished and groomed; neck is supple  CARDIOVASCULAR: Examination of carotid arteries is normal; no carotid bruits Regular rate and rhythm, no murmurs Examination of peripheral vascular system by observation and palpation is normal  EYES: Ophthalmoscopic exam of optic discs and posterior segments is normal; no papilledema or hemorrhages No results found.  MUSCULOSKELETAL: Gait, strength, tone, movements noted in Neurologic exam below  NEUROLOGIC: MENTAL STATUS:  No flowsheet data found. awake, alert, oriented to person, place and time recent and remote memory intact normal attention and concentration language fluent, comprehension intact, naming intact fund of knowledge appropriate  CRANIAL NERVE:  2nd - no papilledema on fundoscopic exam 2nd, 3rd, 4th, 6th - pupils 71mm equal and reactive to light, visual fields full to confrontation,  extraocular muscles intact, no nystagmus 5th - facial sensation symmetric 7th - facial strength symmetric 8th - hearing intact 9th - palate elevates symmetrically, uvula midline 11th - shoulder shrug symmetric 12th - tongue protrusion midline  MOTOR:  normal bulk and tone, full strength in the BUE, BLE  SENSORY:  normal and symmetric to light touch, temperature, vibration  COORDINATION:  finger-nose-finger, fine finger movements normal  REFLEXES:  deep tendon reflexes present and symmetric  GAIT/STATION:  narrow based gait     DIAGNOSTIC DATA (LABS, IMAGING, TESTING) - I reviewed patient records, labs, notes, testing and imaging myself where available.  Lab Results  Component Value Date   WBC 3.4 08/26/2020   HGB 14.4 08/26/2020   HCT 43.9 08/26/2020   MCV 82 08/26/2020   PLT 308 08/26/2020      Component Value Date/Time   NA 137 08/26/2020 1440   K 4.1 08/26/2020 1440   CL 99 08/26/2020 1440   CO2 21 08/26/2020 1440   GLUCOSE 89 08/26/2020 1440   GLUCOSE 155 (H) 01/27/2018 0700   BUN 14 08/26/2020 1440   CREATININE 0.89 08/26/2020 1440   CREATININE 1.03 (  H) 11/19/2014 1131   CALCIUM 9.4 08/26/2020 1440   PROT 6.8 03/30/2020 1020   ALBUMIN 4.1 03/30/2020 1020   AST 23 03/30/2020 1020   ALT 28 03/30/2020 1020   ALKPHOS 70 03/30/2020 1020   BILITOT 0.2 03/30/2020 1020   GFRNONAA 43 (L) 02/25/2020 1005   GFRNONAA 57 (L) 11/19/2014 1131   GFRAA 50 (L) 02/25/2020 1005   GFRAA 65 11/19/2014 1131   Lab Results  Component Value Date   CHOL 161 11/19/2014   HDL 46 11/19/2014   LDLCALC 96 11/19/2014   LDLDIRECT 182.8 04/27/2010   TRIG 93 11/19/2014   CHOLHDL 3.5 11/19/2014   Lab Results  Component Value Date   HGBA1C 6.2 (H) 01/23/2018   Lab Results  Component Value Date   HQPRFFMB84 665 01/23/2018   Lab Results  Component Value Date   TSH 0.98 04/27/2010   Lab Results  Component Value Date   ANA Negative 01/23/2018    01/30/18  NMO IgG  Autoantibodies 0.0 - 3.0 U/mL 260.6High       11/10/2019 MRI brain with and without 1. No acute intracranial abnormality. 2. No specific evidence of demyelination. Mild scattered T2/FLAIR hyperintensities within the white matter, which are nonspecific but most suggestive of age-appropriate chronic microvascular ischemic disease. No abnormal enhancement. 3. Small prior microhemorrhages in the pons and right thalamus, most likely related to hypertension.   11/10/2019 MRI cervical spine without 1. Patchy T2 signal abnormality involving the bilateral hemi cord at the level of C4-5, most characteristic of chronic myelomalacia. No other signal abnormality within the cervical spinal cord to suggest demyelinating disease. 2. Multilevel cervical spondylosis with resultant mild to moderate diffuse spinal stenosis at C3-4 through C6-7. 3. Multifactorial degenerative changes with resultant moderate to severe C4 through C8 foraminal narrowing as detailed above, most notable at C5 and C6 on the right.  11/10/2019 MRI thoracic without 1. Normal MRI appearance of the thoracic spinal cord. No evidence for demyelinating disease. 2. Mild multilevel degenerative spondylosis with resultant mild flattening of the left ventral cord at T4-5 through T8-9 as above. No significant spinal stenosis or neural impingement.      ASSESSMENT AND PLAN  70 y.o. year old female here with right optic neuritis (right eye; retrobulbar) and positive NMO antibodies.  Dx:  1. 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 Upper back pain on right side       PLAN:  Neuromyelitis optica (+ NMO ab; + optic neuritis; + ? spinal cord lesion) - continue rituximab every 6 months - check CBC, CMP every 6 months (rituximab monitoring)  RIGHT THORACIC PAIN / RIGHT ARM PAIN / NUMBNESS - continue gabapentin  Kidney function (Cr 1.25) - monitor; follow up with PCP  Orders Placed This Encounter  Procedures   CBC with  Differential/Platelet   Comprehensive metabolic panel   Immunoglobulins, QN, A/E/G/M   Meds ordered this encounter  Medications   gabapentin (NEURONTIN) 300 MG capsule    Sig: Take 1 capsule (300 mg total) by mouth 3 (three) times daily.    Dispense:  270 capsule    Refill:  4    Return in about 6 months (around 06/15/2021).    Penni Bombard, MD 99/03/5699, 7:79TJ Certified in Neurology, Neurophysiology and Neuroimaging  [facility] [address], [place], [place] [address] [phone]

## 2020-12-22 ENCOUNTER — Other Ambulatory Visit: Payer: Self-pay | Admitting: Student

## 2021-01-17 ENCOUNTER — Institutional Professional Consult (permissible substitution): Payer: Medicare HMO | Admitting: Behavioral Health

## 2021-01-20 DIAGNOSIS — H469 Unspecified optic neuritis: Secondary | ICD-10-CM | POA: Diagnosis not present

## 2021-02-02 DIAGNOSIS — H469 Unspecified optic neuritis: Secondary | ICD-10-CM | POA: Diagnosis not present

## 2021-02-03 DIAGNOSIS — H2512 Age-related nuclear cataract, left eye: Secondary | ICD-10-CM | POA: Diagnosis not present

## 2021-02-03 DIAGNOSIS — H469 Unspecified optic neuritis: Secondary | ICD-10-CM | POA: Diagnosis not present

## 2021-02-03 DIAGNOSIS — H43823 Vitreomacular adhesion, bilateral: Secondary | ICD-10-CM | POA: Diagnosis not present

## 2021-02-03 DIAGNOSIS — H25811 Combined forms of age-related cataract, right eye: Secondary | ICD-10-CM | POA: Diagnosis not present

## 2021-02-09 ENCOUNTER — Ambulatory Visit (INDEPENDENT_AMBULATORY_CARE_PROVIDER_SITE_OTHER): Payer: Medicare HMO | Admitting: Internal Medicine

## 2021-02-09 ENCOUNTER — Other Ambulatory Visit: Payer: Self-pay

## 2021-02-09 ENCOUNTER — Encounter: Payer: Self-pay | Admitting: Internal Medicine

## 2021-02-09 VITALS — BP 131/66 | HR 76 | Temp 98.4°F | Resp 24 | Ht 62.0 in | Wt 214.7 lb

## 2021-02-09 DIAGNOSIS — G360 Neuromyelitis optica [Devic]: Secondary | ICD-10-CM | POA: Diagnosis not present

## 2021-02-09 DIAGNOSIS — E785 Hyperlipidemia, unspecified: Secondary | ICD-10-CM | POA: Diagnosis not present

## 2021-02-09 DIAGNOSIS — R5383 Other fatigue: Secondary | ICD-10-CM

## 2021-02-09 DIAGNOSIS — L282 Other prurigo: Secondary | ICD-10-CM | POA: Diagnosis not present

## 2021-02-09 DIAGNOSIS — I10 Essential (primary) hypertension: Secondary | ICD-10-CM

## 2021-02-09 DIAGNOSIS — M79641 Pain in right hand: Secondary | ICD-10-CM | POA: Diagnosis not present

## 2021-02-09 DIAGNOSIS — Z0000 Encounter for general adult medical examination without abnormal findings: Secondary | ICD-10-CM

## 2021-02-09 DIAGNOSIS — E7800 Pure hypercholesterolemia, unspecified: Secondary | ICD-10-CM

## 2021-02-09 DIAGNOSIS — E119 Type 2 diabetes mellitus without complications: Secondary | ICD-10-CM

## 2021-02-09 DIAGNOSIS — M79642 Pain in left hand: Secondary | ICD-10-CM | POA: Diagnosis not present

## 2021-02-09 MED ORDER — DICLOFENAC SODIUM 1 % EX GEL: 2.0000 g | Freq: Four times a day (QID) | CUTANEOUS | 2 refills | Status: DC | PRN

## 2021-02-09 NOTE — Assessment & Plan Note (Signed)
Patient takes lovastatin 20 daily.  - f/u Lipid panel

## 2021-02-09 NOTE — Assessment & Plan Note (Signed)
BP well controlled, BP today is 131/66.   -Continue Coreg 25 mg twice daily, verapamil 120 mg daily, spironolactone 25 mg daily, and Lasix 40 mg daily

## 2021-02-09 NOTE — Assessment & Plan Note (Signed)
-   will check A1c and lipids today

## 2021-02-09 NOTE — Assessment & Plan Note (Signed)
-   flu shot today

## 2021-02-09 NOTE — Progress Notes (Signed)
° °  CC: Joint pain  HPI:  Michelle Hurley is a 71 y.o. PMH noted below, who presents to the [facility] with complaints of joint pain. To see the management of his acute and chronic conditions, please refer to the A&P note under the encounters tab.   Past Medical History:  Diagnosis Date   Abdominal pain 05/22/2014   Allergic rhinitis 04/16/2015   Anxiety state, unspecified    ARTHRALGIA UNSPECIFIED SITE 12/04/2008   Qualifier: Diagnosis of  By: Melvyn Novas MD, Christena Deem    Arthritis    Atherosclerosis of abdominal aorta ([place]) 10/01/2015   Aortoiliac atherosclerosis noted on CT abdomen in 2016.   Atypical chest pain 12/11/2018   Cough with congestion of paranasal sinus 06/01/2015   Dizziness 04/06/2012   Essential hypertension 12/24/2006   Estrogen deficiency 11/16/2016   GERD (gastroesophageal reflux disease) 08/07/2018   H/O: hysterectomy    Hx-TIA (transient ischemic attack) 04/06/2012   Hypertension    Hypokalemia 02/12/2018   Influenza-like illness 02/02/2016   Loss of part of visual field    Morbid obesity ([place])    Myopia    Nausea with vomiting 05/23/2014   Neuromyelitis optica ([place]) 01/22/2018   NSVT (nonsustained ventricular tachycardia)    a. 01/2005 Echo: EF 55-65%   Osteopenia    Other and unspecified hyperlipidemia    Palpitations    Plantar fasciitis, bilateral 06/12/2016   Plantar fasciitis, bilateral 06/12/2016   Rash 03/23/2017   Right ankle injury, initial encounter 06/12/2016   Right elbow pain 06/11/2017   Right leg pain 09/06/2016   Right optic neuritis 01/22/2018   Sciatica 01/15/2018   Shoulder pain, bilateral (Concern for CAD) 08/28/2018   Shoulder pain, bilateral (Concern for CAD) 0/14/1030   Umbilical hernia    Unspecified essential hypertension    Vaginal candidiasis 02/12/2018   VENTRICULAR TACHYCARDIA 12/04/2008   F/u  [facility] Hochrein     Viral upper respiratory tract infection 01/15/2018   Vitamin D deficiency 02/12/2018   Review of Systems:  Negative for fevers, chills, leg  swelling, anxiety, depression.   Physical Exam: Gen: Elderly woman in NAD HEENT: normocephalic atraumatic, MM CV: RRR, no m/r/g, no pedal edema Resp: CTAB, normal WOB  GI: soft, nontender MSK: stiffness in her bilateral hands, no erythema or swelling.  Skin: warm and dry Neuro:alert answering questions appropriately Psych: normal affect   Assessment & Plan:   See Encounters Tab for problem based charting.  Patient discussed with Dr.  Saverio Danker

## 2021-02-09 NOTE — Patient Instructions (Signed)
Michelle Hurley  It was a pleasure seeing you in the clinic today.   We talked about your hand pain, your blood pressure, life stress, as well as your list of medications.   Joint pain- try over the counter voltaren gel. This is an anti-inflammatory that should help with your pain. You can also take 1000mg  of tylenol three times a day   Please call our clinic at [phone] if you have any questions or concerns. The best time to call is Monday-Friday from 9am-4pm, but there is someone available 24/7 at the same number. If you need medication refills, please notify your pharmacy one week in advance and they will send [name] a request.   Thank you for letting us take part in your care. We look forward to seeing you next time!   [place]

## 2021-02-09 NOTE — Assessment & Plan Note (Signed)
Patient uses medications she gets from her dermatologist as needed. Not currently in a flair. Uses xyzal occasionally for itching,.  - continue current management

## 2021-02-09 NOTE — Assessment & Plan Note (Signed)
Patient has pain again today, says that her joints feel still. No fever or chills and no swelling or erythema on exam. XR at last visit suggestive of osteoarthritis.  - CBC BMP - voltaren gel - scheduled tylenol 1 G three times a day

## 2021-02-09 NOTE — Assessment & Plan Note (Signed)
Patient has been seen by ophthalmology already this year, has no visual complaints. - continue rituxan

## 2021-02-10 ENCOUNTER — Telehealth: Payer: Self-pay | Admitting: Internal Medicine

## 2021-02-10 DIAGNOSIS — E119 Type 2 diabetes mellitus without complications: Secondary | ICD-10-CM | POA: Insufficient documentation

## 2021-02-10 LAB — CBC
Hematocrit: 39.7 % (ref 34.0–46.6)
Hemoglobin: 13.2 g/dL (ref 11.1–15.9)
MCH: 27 pg (ref 26.6–33.0)
MCHC: 33.2 g/dL (ref 31.5–35.7)
MCV: 81 fL (ref 79–97)
Platelets: 251 10*3/uL (ref 150–450)
RBC: 4.89 x10E6/uL (ref 3.77–5.28)
RDW: 13.8 % (ref 11.7–15.4)
WBC: 5.1 10*3/uL (ref 3.4–10.8)

## 2021-02-10 LAB — HEMOGLOBIN A1C
Est. average glucose Bld gHb Est-mCnc: 177 mg/dL
Hgb A1c MFr Bld: 7.8 % — ABNORMAL HIGH (ref 4.8–5.6)

## 2021-02-10 LAB — CMP14 + ANION GAP
ALT: 24 IU/L (ref 0–32)
AST: 15 IU/L (ref 0–40)
Albumin/Globulin Ratio: 1.7 (ref 1.2–2.2)
Albumin: 4.1 g/dL (ref 3.7–4.7)
Alkaline Phosphatase: 68 IU/L (ref 44–121)
Anion Gap: 14 mmol/L (ref 10.0–18.0)
BUN/Creatinine Ratio: 17 (ref 12–28)
BUN: 17 mg/dL (ref 8–27)
Bilirubin Total: <0.2 mg/dL (ref 0.0–1.2)
CO2: 22 mmol/L (ref 20–29)
Calcium: 9.6 mg/dL (ref 8.7–10.3)
Chloride: 102 mmol/L (ref 96–106)
Creatinine, Ser: 1.03 mg/dL — ABNORMAL HIGH (ref 0.57–1.00)
Globulin, Total: 2.4 g/dL (ref 1.5–4.5)
Glucose: 144 mg/dL — ABNORMAL HIGH (ref 70–99)
Potassium: 4.1 mmol/L (ref 3.5–5.2)
Sodium: 138 mmol/L (ref 134–144)
Total Protein: 6.5 g/dL (ref 6.0–8.5)
eGFR: 58 mL/min/{1.73_m2} — ABNORMAL LOW (ref >59)

## 2021-02-10 LAB — LIPID PANEL
Chol/HDL Ratio: 6 ratio — ABNORMAL HIGH (ref 0.0–4.4)
Cholesterol, Total: 203 mg/dL — ABNORMAL HIGH (ref 100–199)
HDL: 34 mg/dL — ABNORMAL LOW (ref >39)
LDL Chol Calc (NIH): 106 mg/dL — ABNORMAL HIGH (ref 0–99)
Triglycerides: 365 mg/dL — ABNORMAL HIGH (ref 0–149)
VLDL Cholesterol Cal: 63 mg/dL — ABNORMAL HIGH (ref 5–40)

## 2021-02-10 MED ORDER — ATORVASTATIN CALCIUM 40 MG PO TABS: 40.0000 mg | ORAL_TABLET | Freq: Every day | ORAL | 11 refills | Status: DC

## 2021-02-10 MED ORDER — METFORMIN HCL 500 MG PO TABS: 500.0000 mg | ORAL_TABLET | Freq: Every day | ORAL | 11 refills | Status: DC

## 2021-02-10 NOTE — Telephone Encounter (Signed)
Encounter entered in error.

## 2021-02-10 NOTE — Addendum Note (Signed)
Addended by: Inda Coke on: 02/10/2021 11:10 AM   Modules accepted: Orders

## 2021-02-10 NOTE — Assessment & Plan Note (Signed)
Newly diagnosed as of 02/09/21. A1c of 7.8.   Addendum:  - spoke with patient about starting metformin and switching to a high intensity statin - follow up in 1 month to discuss new diagnosis and medication. - consider ozempic

## 2021-02-11 NOTE — Progress Notes (Signed)
Internal Medicine Clinic Attending ° °Case discussed with Dr. DeMaio  At the time of the visit.  We reviewed the resident’s history and exam and pertinent patient test results.  I agree with the assessment, diagnosis, and plan of care documented in the resident’s note. ° ° °

## 2021-02-17 ENCOUNTER — Telehealth: Payer: Self-pay

## 2021-02-17 NOTE — Telephone Encounter (Signed)
Question about taking atorvastatin (LIPITOR) 40 MG tablet, please call pt back.

## 2021-02-17 NOTE — Telephone Encounter (Signed)
Returned call to patient. States she took her last dose of lovastatin this AM and just received Atorvastatin in mail. Advised she wait to start taking atorvastatin till tomorrow with her evening meal.  Also, discussed checking CBG AM fasting and keeping log as well as bring meter to next visit. She will start taking metformin every AM with breakfast.

## 2021-02-22 ENCOUNTER — Telehealth: Payer: Self-pay

## 2021-02-22 NOTE — Telephone Encounter (Signed)
Pt is requesting a call back .. she stated that she called yesterday about  stopping taking her metformin because it is making her cough .. but I dont see a message as placed .Marland Kitchen

## 2021-02-22 NOTE — Telephone Encounter (Signed)
Return pt's call. Stated she was started on Metformin last Wednesday and she started coughing on Saturday so badly she could not breathe. She was unable to go to church on Sunday. So she stopped taking it yesterday and did not take it today. Stated she called yesterday but there's no telephone note in her chart. No available appts  until next week at this time. Informed pt I will make her doctor aware.

## 2021-02-23 ENCOUNTER — Telehealth: Payer: Self-pay

## 2021-02-23 NOTE — Telephone Encounter (Signed)
Patient called back upset that she hasn't heard back regarding yesterday's phone call. States she started metformin on 2/8 and by 2/11 she was "coughing so bad like I had pneumonia; head stopped up, nose running." States she stopped metformin on 2/13 and by afternoon felt better, no cough, congestion. States she took CBG this AM and it was 190 so she took another metformin and is worried cough will return. She is convinced it's the metformin and not a cold, covid that caused these sx. Please advise.

## 2021-02-23 NOTE — Telephone Encounter (Signed)
Called pt to schedule a telehealth appt per Dr Allyson Sabal. Call transferred to front office - appt schedule 03/01/20 @ 1515PM.

## 2021-02-23 NOTE — Telephone Encounter (Signed)
Advised patient to stop taking metformin due to side effects (URI symptoms which are relatively rare, states symptoms improved after stopping metformin on Monday and recurred after taking metformin again today). Recommended ozempic for diabetes control but she does not want to inject herself. Given other obligations today, will schedule patient for telehealth visit at earliest convenience to discuss other medication options for optimal glycemic control.   Plan: -discontinued metformin -schedule telehealth visit for further discussion/choosing another diabetic medication

## 2021-02-23 NOTE — Telephone Encounter (Signed)
OPEN ERROR

## 2021-03-01 ENCOUNTER — Ambulatory Visit (INDEPENDENT_AMBULATORY_CARE_PROVIDER_SITE_OTHER): Payer: Medicare HMO | Admitting: Student

## 2021-03-01 DIAGNOSIS — E119 Type 2 diabetes mellitus without complications: Secondary | ICD-10-CM | POA: Diagnosis not present

## 2021-03-01 MED ORDER — EMPAGLIFLOZIN 10 MG PO TABS: 10.0000 mg | ORAL_TABLET | Freq: Every day | ORAL | 1 refills | Status: DC

## 2021-03-01 NOTE — Progress Notes (Signed)
[facility] Internal Medicine Residency Telephone Encounter Continuity Care Appointment  HPI:  This telephone encounter was created for [name] on 03/01/2021 for the following purpose/cc: f/u of T2DM. Ms. Nicodemus was recently diagnosed with T2DM (A1c 7.8%) at last office visit earlier this month. She was started on metformin therapy for better glycemic control. She reports that after starting metformin, she began experiencing GI side effects that were tolerable but she also developed a cough that was incessant. States that cough was dry and went away once she stopped using metformin (very rare association of cough with metformin). She states that she wants to control her T2DM but does not wish to use metformin or any injection medications (insulins, GLP-1a, etc).  Since stopping metformin, she has tried to control her diabetes with diet alone. States that she has cut out potatoes, breads, and other sugars/carbs. She also reports that her CBG readings were in the 180s-200s directly after last office visit, but for the past several days they have ranged in the 120s with diet alone. She does acknowledge that she has not been exercising at home. I recommended light-to-moderate exercise (breaking a sweat) for about 30 minutes a day for 5 days a week. She is willing to incorporate this into her lifestyle. We also discussed trial of jardiance for better glycemic control. She is interested in trying this medication. She denies any recent UTIs (does endorse having a boil that has now resolved) or any episodes of DKA in the past. Will prescribe Jardiance 10mg  daily at this time. Can titrate up as needed and as tolerated.   Past Medical History:  Past Medical History:  Diagnosis Date   Abdominal pain 05/22/2014   Allergic rhinitis 04/16/2015   Anxiety state, unspecified    ARTHRALGIA UNSPECIFIED SITE 12/04/2008   Qualifier: Diagnosis of  By: Melvyn Novas MD, Christena Deem    Arthritis    Atherosclerosis of  abdominal aorta ([place]) 10/01/2015   Aortoiliac atherosclerosis noted on CT abdomen in 2016.   Atypical chest pain 12/11/2018   Cough with congestion of paranasal sinus 06/01/2015   Dizziness 04/06/2012   Essential hypertension 12/24/2006   Estrogen deficiency 11/16/2016   GERD (gastroesophageal reflux disease) 08/07/2018   H/O: hysterectomy    Hx-TIA (transient ischemic attack) 04/06/2012   Hypertension    Hypokalemia 02/12/2018   Influenza-like illness 02/02/2016   Loss of part of visual field    Morbid obesity ([place])    Myopia    Nausea with vomiting 05/23/2014   Neuromyelitis optica ([place]) 01/22/2018   NSVT (nonsustained ventricular tachycardia)    a. 01/2005 Echo: EF 55-65%   Osteopenia    Other and unspecified hyperlipidemia    Palpitations    Plantar fasciitis, bilateral 06/12/2016   Plantar fasciitis, bilateral 06/12/2016   Rash 03/23/2017   Right ankle injury, initial encounter 06/12/2016   Right elbow pain 06/11/2017   Right leg pain 09/06/2016   Right optic neuritis 01/22/2018   Sciatica 01/15/2018   Shoulder pain, bilateral (Concern for CAD) 08/28/2018   Shoulder pain, bilateral (Concern for CAD) 9/93/5701   Umbilical hernia    Unspecified essential hypertension    Vaginal candidiasis 02/12/2018   VENTRICULAR TACHYCARDIA 12/04/2008   F/u  [facility]ochrein     Viral upper respiratory tract infection 01/15/2018   Vitamin D deficiency 02/12/2018     ROS:  Negative aside from that in HPI   Assessment / Plan / Recommendations:  Please see A&P under problem oriented charting for assessment of  the patient's acute and chronic medical conditions.  As always, pt is advised that if symptoms worsen or new symptoms arise, they should go to an urgent care facility or to to ER for further evaluation.   Consent and Medical Decision Making:  Patient discussed with Dr.  Cain Sieve This is a telephone encounter between Michelle Hurley and Virl Axe on 03/01/2021 for f/u of T2DM. The visit was conducted with the  patient located at home and Virl Axe at [facility]. The patient's identity was confirmed using their DOB and current address. The patient has consented to being evaluated through a telephone encounter and understands the associated risks (an examination cannot be done and the patient may need to come in for an appointment) / benefits (allows the patient to remain at home, decreasing exposure to coronavirus). I personally spent 30 minutes on medical discussion.

## 2021-03-01 NOTE — Assessment & Plan Note (Signed)
Michelle Hurley was recently diagnosed with T2DM (A1c 7.8%) at last office visit earlier this month. She was started on metformin therapy for better glycemic control.  She reports that after starting metformin, she began experiencing GI side effects that were tolerable but she also developed a cough that was incessant. States that cough was dry and went away once she stopped using metformin (very rare association of cough with metformin). She states that she wants to control her T2DM but does not wish to use metformin or any injection medications (insulins, GLP-1a, etc).   Since stopping metformin, she has tried to control her diabetes with diet alone. States that she has cut out potatoes, breads, and other sugars/carbs. She also reports that her CBG readings were in the 180s-200s directly after last office visit, but for the past several days they have ranged in the 120s with diet alone. She does acknowledge that she has not been exercising at home. I recommended light-to-moderate exercise (breaking a sweat) for about 30 minutes a day for 5 days a week. She is willing to incorporate this into her lifestyle.  We also discussed trial of jardiance for better glycemic control. She is interested in trying this medication. She denies any recent UTIs (does endorse having a boil that has now resolved) or any episodes of DKA in the past. Will prescribe Jardiance 10mg  daily at this time. Can titrate up as needed and as tolerated.  Plan: -continue diabetic diet -start light-to-moderate intensity exercise >150 mins/week -start jardiance 10mg  daily -f/u in 2 months for repeat A1c

## 2021-03-02 NOTE — Progress Notes (Signed)
Internal Medicine Clinic Attending  Case discussed with Dr. Allyson Sabal  At the time of the visit.  We reviewed the residents history and exam and pertinent patient test results.  I agree with the assessment, diagnosis, and plan of care documented in the residents note.   Patient did not tolerate metformin. A1C 7.8 on last check. Start Jardiance 10mg  daily today. Continue lifestyle changes. Repeat A1C at next visit - can consider increasing Jardiance then if needed

## 2021-03-14 ENCOUNTER — Telehealth: Payer: Self-pay | Admitting: Internal Medicine

## 2021-03-14 NOTE — Telephone Encounter (Signed)
Pt states since she has started taking the following medication she has now become very congested. ? ?Pt also states her Blood sugars are doing better but the congestion is really bothering her and she needs to know what to do. ? ? ? Jardiance  ?

## 2021-03-17 NOTE — Telephone Encounter (Signed)
Attempted to contact patient, but no answer. Left message asking to give the clinic a call back to schedule an appointment.  ?

## 2021-03-30 ENCOUNTER — Encounter: Payer: Self-pay | Admitting: Internal Medicine

## 2021-03-30 ENCOUNTER — Ambulatory Visit (INDEPENDENT_AMBULATORY_CARE_PROVIDER_SITE_OTHER): Payer: Medicare HMO | Admitting: Internal Medicine

## 2021-03-30 DIAGNOSIS — E119 Type 2 diabetes mellitus without complications: Secondary | ICD-10-CM | POA: Diagnosis not present

## 2021-03-30 DIAGNOSIS — I10 Essential (primary) hypertension: Secondary | ICD-10-CM

## 2021-03-30 NOTE — Progress Notes (Signed)
? ?  CC: DM check ? ?HPI: ? ?[name] is a 71 y.o. with past medical history as noted below who presents to the clinic today for a DM check. Please see problem-based list for further details, assessments, and plans.  ? ?Past Medical History:  ?Diagnosis Date  ? Abdominal pain 05/22/2014  ? Allergic rhinitis 04/16/2015  ? Anxiety state, unspecified   ? ARTHRALGIA UNSPECIFIED SITE 12/04/2008  ? Qualifier: Diagnosis of  By: Melvyn Novas MD, Christena Deem   ? Arthritis   ? Atherosclerosis of abdominal aorta ([place]) 10/01/2015  ? Aortoiliac atherosclerosis noted on CT abdomen in 2016.  ? Atypical chest pain 12/11/2018  ? Cough with congestion of paranasal sinus 06/01/2015  ? Dizziness 04/06/2012  ? Essential hypertension 12/24/2006  ? Estrogen deficiency 11/16/2016  ? GERD (gastroesophageal reflux disease) 08/07/2018  ? H/O: hysterectomy   ? Hx-TIA (transient ischemic attack) 04/06/2012  ? Hypertension   ? Hypokalemia 02/12/2018  ? Influenza-like illness 02/02/2016  ? Loss of part of visual field   ? Morbid obesity ([place])   ? Myopia   ? Nausea with vomiting 05/23/2014  ? Neuromyelitis optica ([place]) 01/22/2018  ? NSVT (nonsustained ventricular tachycardia)   ? a. 01/2005 Echo: EF 55-65%  ? Osteopenia   ? Other and unspecified hyperlipidemia   ? Palpitations   ? Plantar fasciitis, bilateral 06/12/2016  ? Plantar fasciitis, bilateral 06/12/2016  ? Rash 03/23/2017  ? Right ankle injury, initial encounter 06/12/2016  ? Right elbow pain 06/11/2017  ? Right leg pain 09/06/2016  ? Right optic neuritis 01/22/2018  ? Sciatica 01/15/2018  ? Shoulder pain, bilateral (Concern for CAD) 08/28/2018  ? Shoulder pain, bilateral (Concern for CAD) 08/28/2018  ? Umbilical hernia   ? Unspecified essential hypertension   ? Vaginal candidiasis 02/12/2018  ? VENTRICULAR TACHYCARDIA 12/04/2008  ? F/u  [facility] Hochrein    ? Viral upper respiratory tract infection 01/15/2018  ? Vitamin D deficiency 02/12/2018  ? ?Review of Systems: Negative aside from that listed in individualized problem based  charting.  ? ?Physical Exam: ? ?Vitals:  ? 03/30/21 0916  ?BP: 118/74  ?Pulse: 64  ?Resp: (!) 24  ?Temp: 98.2 ?F (36.8 ?C)  ?TempSrc: Oral  ?SpO2: 99%  ?Weight: 210 lb (95.3 kg)  ?Height: '5\' 2"'$  (1.575 m)  ? ?General: NAD, nl appearance ?HE: Normocephalic, atraumatic, EOMI, Conjunctivae normal ?ENT: No congestion, no rhinorrhea, no exudate or erythema  ?Cardiovascular: Normal rate, regular rhythm. No murmurs, rubs, or gallops ?Pulmonary: Effort normal, breath sounds normal. No wheezes, rales, or rhonchi ?Abdominal: soft, nontender, bowel sounds present ?Musculoskeletal: no swelling, deformity, injury or tenderness in extremities ?Skin: Warm, dry, no bruising, erythema, or rash ?Psychiatric/Behavioral: normal mood, normal behavior    ? ?Assessment & Plan:  ? ?See Encounters Tab for problem based charting. ? ?Patient discussed with Dr. Daryll Drown ? ?

## 2021-03-30 NOTE — Assessment & Plan Note (Addendum)
BP Readings from Last 3 Encounters:  ?03/30/21 118/74  ?02/09/21 131/66  ?11/23/20 (!) 145/74  ? ?Well-controlled on current regimen.  Continue Coreg 25 mg twice daily, verapamil 120 mg daily, spironolactone 25 mg daily, and Lasix 40 mg daily. ?

## 2021-03-30 NOTE — Patient Instructions (Addendum)
Thank you, [name] for allowing [name] to provide your care today. Today we discussed your diabetes. ? ?1) You can continue to check your sugars for now with your new diagnosis. We will see you again in 1.5 months and will check your A1c then. Continue Jardiance as usual. ? ?I have ordered the following labs for you: ? ?Lab Orders  ?No laboratory test(s) ordered today  ?  ? ?Referrals ordered today:  ? ?Referral Orders  ?No referral(s) requested today  ?  ? ?I have ordered the following medication/changed the following medications:  ? ?Stop the following medications: ?There are no discontinued medications.  ? ?Start the following medications: ?No orders of the defined types were placed in this encounter. ?  ? ?Follow up:  1.5 months   ? ? ?Should you have any questions or concerns please call the internal medicine clinic at [phone].   ? ? ?

## 2021-03-30 NOTE — Assessment & Plan Note (Signed)
The patient was recently diagnosed with T2DM (A1c of 7.8). She is here today for follow-up.  She has been on Jardiance 10 mg daily (has not tolerated metformin due to GI side fracture and incessant cough).  She states that she initially had a lot of congestion after starting Jardiance, however this has completely resolved and she is now doing well.  She checks her sugars at home and they are typically in the 90s-140s range. ? ?Plan: ?We will continue current regimen for now.  Patient is due for an A1c in 6 weeks.   ?

## 2021-04-06 NOTE — Progress Notes (Signed)
Internal Medicine Clinic Attending  Case discussed with Dr. Bonanno  at the time of the visit.  We reviewed the resident's history and exam and pertinent patient test results.  I agree with the assessment, diagnosis, and plan of care documented in the resident's note.  

## 2021-04-16 ENCOUNTER — Other Ambulatory Visit: Payer: Self-pay | Admitting: Student

## 2021-04-16 ENCOUNTER — Other Ambulatory Visit: Payer: Self-pay | Admitting: Internal Medicine

## 2021-04-16 DIAGNOSIS — M549 Dorsalgia, unspecified: Secondary | ICD-10-CM

## 2021-04-20 NOTE — Telephone Encounter (Signed)
Next appt scheduled 5/3 with PCP. ?

## 2021-04-21 ENCOUNTER — Telehealth: Payer: Self-pay

## 2021-04-21 NOTE — Telephone Encounter (Signed)
Pt is requesting lift bed. Please call pt back.  ?

## 2021-04-25 NOTE — Telephone Encounter (Signed)
I returned phone call back to patient requesting a Lift bed. The patient has not been able to lay flat in her own bed for 2 years because of her back and she has a hernia.The patient has been sleeping in a recliner that stretches out but lifts her head up but has not been very comfortable.I will send a message to her PCP and  also a community message to [employer] to see what they carry outside of the beds they carry [place], [place]a [place] C4/17/202312:11 PM ? ?

## 2021-04-28 NOTE — Telephone Encounter (Signed)
Patient calling stating no one  has contacted her about her lift bed.  Requesting a call back from [facility]. ?

## 2021-05-11 ENCOUNTER — Encounter: Payer: Self-pay | Admitting: Internal Medicine

## 2021-05-11 ENCOUNTER — Other Ambulatory Visit: Payer: Self-pay

## 2021-05-11 ENCOUNTER — Ambulatory Visit (INDEPENDENT_AMBULATORY_CARE_PROVIDER_SITE_OTHER): Payer: Medicare HMO | Admitting: Internal Medicine

## 2021-05-11 VITALS — BP 125/69 | HR 63 | Temp 98.4°F | Resp 24 | Ht 62.0 in | Wt 212.8 lb

## 2021-05-11 DIAGNOSIS — R058 Other specified cough: Secondary | ICD-10-CM

## 2021-05-11 DIAGNOSIS — E119 Type 2 diabetes mellitus without complications: Secondary | ICD-10-CM | POA: Diagnosis not present

## 2021-05-11 LAB — POCT GLYCOSYLATED HEMOGLOBIN (HGB A1C): Hemoglobin A1C: 6.8 % — AB (ref 4.0–5.6)

## 2021-05-11 LAB — GLUCOSE, CAPILLARY: Glucose-Capillary: 157 mg/dL — ABNORMAL HIGH (ref 70–99)

## 2021-05-11 NOTE — Assessment & Plan Note (Signed)
Patient came in to request a hospital bed for a night time cough. Says that she cannot lay flat at night because she will wake up coughing. Feels like something drips down the back of her throat. Also has acid reflux for which she is not taking anything. This night cough has been stable for three years, she does not have exertional dyspnea, day time cough, SOB, leg swelling, chest pain, or palpitations. She is euvolemic on exam and it is unlikely this is cardiac related given the stability of her symptoms. I think it is more likely that this is post-nasal drip and or GERD related. Patient hates taking medications and does not feel like she needs to take any medication since it does not happen if she lies flat. Discussed with patient that unfortunately insurance would not cover a hospital bed to prevent a night time cough. Patient frustrated but understands and says that she will just have to try and find a way to buy a new recliner. ?

## 2021-05-11 NOTE — Assessment & Plan Note (Signed)
A1c is improved today, 6.8 from 7.8 after dietary modifications as well as starting jardiance 10. Patient got a foot exam today, microalbumin, and is up to date on her eye exam. Provided encouragement for A1c improvement. ?- recheck A1c at next visit ?- continue jardiance ?

## 2021-05-11 NOTE — Patient Instructions (Signed)
Elon Spanner ? ?It was a pleasure seeing you in the clinic today.  ? ?We talked about your cough and your diabetes. ? ?Diabetes- keep up the great work! You A1c looks great today. Keep eating healthy and taking your jardiance ? ?Cough- if you are interested in trying medications to see if we can stop your cough please let us know. I am sorry we can't get you a hospital bed.  ? ?Please call our clinic at [phone] if you have any questions or concerns. The best time to call is Monday-Friday from 9am-4pm, but there is someone available 24/7 at the same number. If you need medication refills, please notify your pharmacy one week in advance and they will send [name] a request. ?  ?Thank you for letting us take part in your care. We look forward to seeing you next time! ? ?

## 2021-05-11 NOTE — Assessment & Plan Note (Signed)
Patient says she is working to eat healthier and lose weight.  ?

## 2021-05-11 NOTE — Progress Notes (Signed)
? ?  CC: night cough ? ?HPI: ? ?[name] is a 71 y.o. PMH noted below, who presents to the [facility] with complaints of night cough. To see the management of his acute and chronic conditions, please refer to the A&P note under the encounters tab.  ? ?Past Medical History:  ?Diagnosis Date  ? Abdominal pain 05/22/2014  ? Allergic rhinitis 04/16/2015  ? Anxiety state, unspecified   ? ARTHRALGIA UNSPECIFIED SITE 12/04/2008  ? Qualifier: Diagnosis of  By: Melvyn Novas MD, Christena Deem   ? Arthritis   ? Atherosclerosis of abdominal aorta ([place]) 10/01/2015  ? Aortoiliac atherosclerosis noted on CT abdomen in 2016.  ? Atypical chest pain 12/11/2018  ? Cough with congestion of paranasal sinus 06/01/2015  ? Dizziness 04/06/2012  ? Essential hypertension 12/24/2006  ? Estrogen deficiency 11/16/2016  ? GERD (gastroesophageal reflux disease) 08/07/2018  ? H/O: hysterectomy   ? Hx-TIA (transient ischemic attack) 04/06/2012  ? Hypertension   ? Hypokalemia 02/12/2018  ? Influenza-like illness 02/02/2016  ? Loss of part of visual field   ? Morbid obesity ([place])   ? Myopia   ? Nausea with vomiting 05/23/2014  ? 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 NSVT (nonsustained ventricular tachycardia) ([place])   ? a. 01/2005 Echo: EF 55-65%  ? Osteopenia   ? Other and unspecified hyperlipidemia   ? Palpitations   ? Plantar fasciitis, bilateral 06/12/2016  ? Plantar fasciitis, bilateral 06/12/2016  ? Rash 03/23/2017  ? Right ankle injury, initial encounter 06/12/2016  ? Right elbow pain 06/11/2017  ? Right leg pain 09/06/2016  ? Right optic neuritis 01/22/2018  ? Sciatica 01/15/2018  ? Shoulder pain, bilateral (Concern for CAD) 08/28/2018  ? Shoulder pain, bilateral (Concern for CAD) 08/28/2018  ? Umbilical hernia   ? Unspecified essential hypertension   ? Vaginal candidiasis 02/12/2018  ? VENTRICULAR TACHYCARDIA 12/04/2008  ? F/u  [facility] Hochrein    ? Viral upper respiratory tract infection 01/15/2018  ? Vitamin D deficiency 02/12/2018  ? ?Review of Systems:  positive for allergies, night  cough, negative for shortness of breath, chest pain, PND, palpitations, weight loss ? ?Physical Exam: ?Gen: elderly woman in NAD ?HEENT: normocephalic atraumatic, MMM ?CV: RRR, no m/r/g   ?Resp: CTAB, normal WOB  ?GI: soft, nontender ?MSK: moves all extremities without difficulty ?Skin:warm and dry, no lower extremity edema ?Neuro:alert answering questions appropriately ?Psych: normal affect ? ? ?Assessment & Plan:  ? ?See Encounters Tab for problem based charting. ? ?Patient discussed with Dr. Evette Doffing  ? ?

## 2021-05-12 LAB — MICROALBUMIN / CREATININE URINE RATIO
Creatinine, Urine: 27.2 mg/dL
Microalb/Creat Ratio: <11 mg/g creat (ref 0–29)
Microalbumin, Urine: <3 ug/mL

## 2021-05-13 NOTE — Progress Notes (Signed)
Internal Medicine Clinic Attending ° °Case discussed with Dr. DeMaio  At the time of the visit.  We reviewed the resident’s history and exam and pertinent patient test results.  I agree with the assessment, diagnosis, and plan of care documented in the resident’s note. ° ° °

## 2021-05-16 ENCOUNTER — Other Ambulatory Visit: Payer: Self-pay | Admitting: *Deleted

## 2021-05-16 MED ORDER — VERAPAMIL HCL 120 MG PO TABS: ORAL_TABLET | ORAL | 3 refills | Status: DC

## 2021-05-23 ENCOUNTER — Other Ambulatory Visit: Payer: Self-pay | Admitting: Internal Medicine

## 2021-05-23 DIAGNOSIS — E7800 Pure hypercholesterolemia, unspecified: Secondary | ICD-10-CM

## 2021-06-22 ENCOUNTER — Encounter: Payer: Self-pay | Admitting: Diagnostic Neuroimaging

## 2021-06-22 ENCOUNTER — Ambulatory Visit (INDEPENDENT_AMBULATORY_CARE_PROVIDER_SITE_OTHER): Payer: Medicare HMO | Admitting: Diagnostic Neuroimaging

## 2021-06-22 ENCOUNTER — Ambulatory Visit: Payer: Medicare HMO | Admitting: Diagnostic Neuroimaging

## 2021-06-22 VITALS — BP 131/78 | HR 70 | Ht 62.0 in | Wt 204.5 lb

## 2021-06-22 DIAGNOSIS — G360 Neuromyelitis optica [Devic]: Secondary | ICD-10-CM

## 2021-06-22 NOTE — Progress Notes (Signed)
[facility]  PATIENT: Michelle Hurley DOB: 09-Sep-1950  REFERRING CLINICIAN: Scarlett Presto, MD  HISTORY FROM: patient and chart REASON FOR VISIT: follow up   HISTORICAL  CHIEF COMPLAINT:  Chief Complaint  Patient presents with   Neuromyelitis optica    Rm 7, 6 month FU "next infusion 07/07/21; doing well"     HISTORY OF PRESENT ILLNESS:   UPDATE (06/22/21, VRP): Since last visit, doing well. Symptoms are stable. No alleviating or aggravating factors. Tolerating rituximab infusions.Marland Kitchen    UPDATE (12/15/20, VRP): Since last visit, doing well. Symptoms are stable. Pain is improved. No alleviating or aggravating factors. Tolerating rituximab.    UPDATE (10/22/2019, VRP): Since last visit, doing well until 3 to 4 weeks ago when she developed right thoracic and flank pain, numbness and tingling in her right axillary region and right triceps region.  Patient tried muscle relaxers from PCP without relief.  Patient tried over-the-counter topical pain relievers without relief.  Patient has had MRI of the brain, cervical and thoracic spine ordered and scheduled for the next few weeks.  No other triggering factors.  Some tenderness to palpation just below her shoulder blade on the right side.  Vision stable.  Lower extremity stable.  Left arm stable.  UPDATE (05/19/19, VRP): Since last visit, doing well. Had [place] vaccine in March / April 2021; doing well. Last rituxan infusion in Jan 2021. Symptoms are stable. No alleviating or aggravating factors.     UPDATE (11/18/18, VRP): Since last visit, doing well. Symptoms are improved. Right eye vision almost back to normal. No alleviating or aggravating factors. Tolerating rituxan; next dose in Dec 2020.   VIDEO VISIT UPDATE (05/15/18, VRP):  - overall feels well - vision is improved - no pain - waiting on insurance approval for rituxan  PRIOR HPI (01/30/18): 71 year old female with hypertension, here for evaluation of right  retrobulbar, idiopathic optic neuritis.  01/22/2018 patient presented to the hospital after seeing her ophthalmologist for new onset right eye vision loss.  She reported an altitudinal superior visual field defect with some pain.  No problems in the left eye.  Upon admission patient had MRI of the brain and orbits which showed enhancing right optic nerve lesion.  Mild chronic small vessel ischemic disease was also noted as well as scattered chronic cerebral microhemorrhages.  Patient was diagnosed with idiopathic optic neuritis.  She was treated with IV Solu-Medrol.  She had some side effects of hallucinations and irritability.  She completed her course and was discharged home.  Since that time symptoms are continuing to improve.  No problems with arms, legs, balance, bowel or bladder dysfunction.   REVIEW OF SYSTEMS: Full 14 system review of systems performed and negative with exception of: as per HPI.   ALLERGIES: Allergies  Allergen Reactions   Mushroom Extract Complex Anaphylaxis, Itching and Swelling    Swelling all over body    Codeine Nausea And Vomiting   Hydrocodone Nausea And Vomiting   Prednisone Nausea And Vomiting   Omeprazole Itching    HOME MEDICATIONS: Outpatient Medications Prior to Visit  Medication Sig Dispense Refill   acetaminophen (TYLENOL) 500 MG tablet Take 500 mg by mouth every 6 (six) hours as needed.     aspirin EC 81 MG tablet Take 81 mg by mouth daily. Swallow whole.     atorvastatin (LIPITOR) 40 MG tablet Take 1 tablet (40 mg total) by mouth daily. 30 tablet 11   calcium-vitamin D 250-100 MG-UNIT tablet Take 1 tablet  by mouth 2 (two) times daily. 30 tablet 3   carvedilol (COREG) 25 MG tablet TAKE 1 TABLET TWICE DAILY WITH MEALS 180 tablet 1   cyclobenzaprine (FLEXERIL) 5 MG tablet TAKE 1 TABLET THREE TIMES DAILY AS NEEDED FOR MUSCLE SPASM(S) 30 tablet 1   diclofenac Sodium (VOLTAREN) 1 % GEL APPLY 2 GRAMS TOPICALLY 4 (FOUR) TIMES DAILY AS NEEDED. 100 g 2    DULoxetine (CYMBALTA) 60 MG capsule TAKE 1 CAPSULE EVERY DAY 90 capsule 3   furosemide (LASIX) 40 MG tablet TAKE 1 TABLET EVERY DAY 90 tablet 1   gabapentin (NEURONTIN) 300 MG capsule TAKE 1 CAPSULE THREE TIMES DAILY 60 capsule 3   Heating Pads (SUNBEAM STANDARD HEATING PAD) PADS Please use as directed on the instructions included. 1 each 0   JARDIANCE 10 MG TABS tablet TAKE 1 TABLET EVERY DAY 90 tablet 3   levocetirizine (XYZAL) 5 MG tablet TAKE 1 TABLET EVERY EVENING 90 tablet 1   polyethylene glycol powder (GLYCOLAX/MIRALAX) powder Take 17 g by mouth daily as needed for moderate constipation or severe constipation. 255 g 0   RITUXAN 500 MG/50ML injection 05/14/18 not started yet     spironolactone (ALDACTONE) 25 MG tablet TAKE 1 TABLET EVERY DAY 90 tablet 1   verapamil (CALAN) 120 MG tablet TAKE 1/2 TABLET THREE TIMES DAILY 135 tablet 3   No facility-administered medications prior to visit.    PHYSICAL EXAM  GENERAL EXAM/CONSTITUTIONAL: Vitals:  Vitals:   06/22/21 1406  BP: 131/78  Pulse: 70  Weight: 204 lb 8 oz (92.8 kg)  Height: '5\' 2"'$  (1.575 m)    Body mass index is 37.4 kg/m. Wt Readings from Last 3 Encounters:  06/22/21 204 lb 8 oz (92.8 kg)  05/11/21 212 lb 12.8 oz (96.5 kg)  03/30/21 210 lb (95.3 kg)   No results found.  Patient is in no distress; well developed, nourished and groomed; neck is supple  CARDIOVASCULAR: Examination of carotid arteries is normal; no carotid bruits Regular rate and rhythm, no murmurs Examination of peripheral vascular system by observation and palpation is normal  EYES: Ophthalmoscopic exam of optic discs and posterior segments is normal; no papilledema or hemorrhages No results found.  MUSCULOSKELETAL: Gait, strength, tone, movements noted in Neurologic exam below  NEUROLOGIC: MENTAL STATUS:      No data to display         awake, alert, oriented to person, place and time recent and remote memory intact normal attention  and concentration language fluent, comprehension intact, naming intact fund of knowledge appropriate  CRANIAL NERVE:  2nd - no papilledema on fundoscopic exam 2nd, 3rd, 4th, 6th - pupils 96m equal and reactive to light, visual fields full to confrontation, extraocular muscles intact, no nystagmus 5th - facial sensation symmetric 7th - facial strength symmetric 8th - hearing intact 9th - palate elevates symmetrically, uvula midline 11th - shoulder shrug symmetric 12th - tongue protrusion midline  MOTOR:  normal bulk and tone, full strength in the BUE, BLE  SENSORY:  normal and symmetric to light touch, temperature, vibration  COORDINATION:  finger-nose-finger, fine finger movements normal  REFLEXES:  deep tendon reflexes present and symmetric  GAIT/STATION:  narrow based gait     DIAGNOSTIC DATA (LABS, IMAGING, TESTING) - I reviewed patient records, labs, notes, testing and imaging myself where available.  Lab Results  Component Value Date   WBC 5.1 02/09/2021   HGB 13.2 02/09/2021   HCT 39.7 02/09/2021   MCV 81 02/09/2021  PLT 251 02/09/2021      Component Value Date/Time   NA 138 02/09/2021 1526   K 4.1 02/09/2021 1526   CL 102 02/09/2021 1526   CO2 22 02/09/2021 1526   GLUCOSE 144 (H) 02/09/2021 1526   GLUCOSE 155 (H) 01/27/2018 0700   BUN 17 02/09/2021 1526   CREATININE 1.03 (H) 02/09/2021 1526   CREATININE 1.03 (H) 11/19/2014 1131   CALCIUM 9.6 02/09/2021 1526   PROT 6.5 02/09/2021 1526   ALBUMIN 4.1 02/09/2021 1526   AST 15 02/09/2021 1526   ALT 24 02/09/2021 1526   ALKPHOS 68 02/09/2021 1526   BILITOT <0.2 02/09/2021 1526   GFRNONAA 43 (L) 02/25/2020 1005   GFRNONAA 57 (L) 11/19/2014 1131   GFRAA 50 (L) 02/25/2020 1005   GFRAA 65 11/19/2014 1131   Lab Results  Component Value Date   CHOL 203 (H) 02/09/2021   HDL 34 (L) 02/09/2021   LDLCALC 106 (H) 02/09/2021   LDLDIRECT 182.8 04/27/2010   TRIG 365 (H) 02/09/2021   CHOLHDL 6.0 (H)  02/09/2021   Lab Results  Component Value Date   HGBA1C 6.8 (A) 05/11/2021   Lab Results  Component Value Date   BTDVVOHY07 371 01/23/2018   Lab Results  Component Value Date   TSH 0.98 04/27/2010   Lab Results  Component Value Date   ANA Negative 01/23/2018    01/30/18  NMO IgG Autoantibodies 0.0 - 3.0 U/mL 260.6High       11/10/2019 MRI brain with and without 1. No acute intracranial abnormality. 2. No specific evidence of demyelination. Mild scattered T2/FLAIR hyperintensities within the white matter, which are nonspecific but most suggestive of age-appropriate chronic microvascular ischemic disease. No abnormal enhancement. 3. Small prior microhemorrhages in the pons and right thalamus, most likely related to hypertension.   11/10/2019 MRI cervical spine without 1. Patchy T2 signal abnormality involving the bilateral hemi cord at the level of C4-5, most characteristic of chronic myelomalacia. No other signal abnormality within the cervical spinal cord to suggest demyelinating disease. 2. Multilevel cervical spondylosis with resultant mild to moderate diffuse spinal stenosis at C3-4 through C6-7. 3. Multifactorial degenerative changes with resultant moderate to severe C4 through C8 foraminal narrowing as detailed above, most notable at C5 and C6 on the right.  11/10/2019 MRI thoracic without 1. Normal MRI appearance of the thoracic spinal cord. No evidence for demyelinating disease. 2. Mild multilevel degenerative spondylosis with resultant mild flattening of the left ventral cord at T4-5 through T8-9 as above. No significant spinal stenosis or neural impingement.      ASSESSMENT AND PLAN  71 y.o. year old female here with right optic neuritis (right eye; retrobulbar) and positive NMO antibodies.  Dx:  1. Neuromyelitis optica ([place])       PLAN:  Neuromyelitis optica (+ NMO ab; + optic neuritis; + ? spinal cord lesion) - continue rituximab every 6  months - check CBC, immunoglobulin panel every 6 months (rituximab monitoring)  RIGHT THORACIC PAIN / RIGHT ARM PAIN / NUMBNESS - improved   Orders Placed This Encounter  Procedures   CBC with Differential/Platelet   Immunoglobulins, QN, A/E/G/M   Return in about 6 months (around 12/22/2021).    Penni Bombard, MD 06/11/6946, 5:46EV Certified in Neurology, Neurophysiology and Neuroimaging  [facility] [address], [place], [place] [address] [phone]

## 2021-06-26 LAB — IMMUNOGLOBULINS A/E/G/M, SERUM
IgA/Immunoglobulin A, Serum: 239 mg/dL (ref 64–422)
IgE (Immunoglobulin E), Serum: 15 IU/mL (ref 6–495)
IgG (Immunoglobin G), Serum: 968 mg/dL (ref 586–1602)
IgM (Immunoglobulin M), Srm: 26 mg/dL (ref 26–217)

## 2021-06-26 LAB — CBC WITH DIFFERENTIAL/PLATELET
Basophils Absolute: 0 10*3/uL (ref 0.0–0.2)
Basos: 1 %
EOS (ABSOLUTE): 0.1 10*3/uL (ref 0.0–0.4)
Eos: 1 %
Hematocrit: 45.4 % (ref 34.0–46.6)
Hemoglobin: 14.8 g/dL (ref 11.1–15.9)
Immature Grans (Abs): 0 10*3/uL (ref 0.0–0.1)
Immature Granulocytes: 0 %
Lymphocytes Absolute: 1.3 10*3/uL (ref 0.7–3.1)
Lymphs: 27 %
MCH: 26.3 pg — ABNORMAL LOW (ref 26.6–33.0)
MCHC: 32.6 g/dL (ref 31.5–35.7)
MCV: 81 fL (ref 79–97)
Monocytes Absolute: 0.5 10*3/uL (ref 0.1–0.9)
Monocytes: 10 %
Neutrophils Absolute: 2.9 10*3/uL (ref 1.4–7.0)
Neutrophils: 61 %
Platelets: 309 10*3/uL (ref 150–450)
RBC: 5.62 x10E6/uL — ABNORMAL HIGH (ref 3.77–5.28)
RDW: 13.2 % (ref 11.7–15.4)
WBC: 4.7 10*3/uL (ref 3.4–10.8)

## 2021-06-27 ENCOUNTER — Other Ambulatory Visit: Payer: Self-pay | Admitting: *Deleted

## 2021-06-27 DIAGNOSIS — G360 Neuromyelitis optica [Devic]: Secondary | ICD-10-CM

## 2021-06-27 NOTE — Telephone Encounter (Signed)
Received refill request from [facility] re: Rituxan refill. Per Maudie Mercury RN, infusion suite the patient is on Ruxience due to insurance coverage. Called pharmacy, spoke with [facility] and informed her the patient is not on Rituxan. She is on Ruxience due to insurance coverage. Janett Billow asked for new Rx to be sent, and I gave her Maudie Mercury RN's name and contact phone # in the event they have questions.

## 2021-06-28 ENCOUNTER — Ambulatory Visit: Payer: Medicare HMO | Admitting: Diagnostic Neuroimaging

## 2021-06-28 MED ORDER — RUXIENCE 500 MG/50ML IV SOLN: INTRAVENOUS | 1 refills | Status: DC

## 2021-06-29 ENCOUNTER — Other Ambulatory Visit: Payer: Self-pay | Admitting: *Deleted

## 2021-06-29 DIAGNOSIS — G360 Neuromyelitis optica [Devic]: Secondary | ICD-10-CM

## 2021-06-29 MED ORDER — RITUXAN 500 MG/50ML IV SOLN: INTRAVENOUS | 1 refills | Status: DC

## 2021-06-29 NOTE — Progress Notes (Signed)
Meds ordered this encounter  Medications   RITUXAN 500 MG/50ML injection    Sig: Infuse '1000mg'$  day 1 & '1000mg'$  day 15, every 6 months    Dispense:  100 mL    Refill:  1    Penni Bombard, MD 05/22/6045, 9:98 PM Certified in Neurology, Neurophysiology and Neuroimaging  [facility] [address] [place], [place] [address] [phone]

## 2021-07-01 ENCOUNTER — Telehealth: Payer: Self-pay

## 2021-07-01 NOTE — Telephone Encounter (Signed)
I called the pt and relayed lab results. She verbalized understanding and appreciation for the call.

## 2021-07-02 ENCOUNTER — Encounter: Payer: Self-pay | Admitting: *Deleted

## 2021-07-25 DIAGNOSIS — H469 Unspecified optic neuritis: Secondary | ICD-10-CM | POA: Diagnosis not present

## 2021-08-02 DIAGNOSIS — H25811 Combined forms of age-related cataract, right eye: Secondary | ICD-10-CM | POA: Diagnosis not present

## 2021-08-02 DIAGNOSIS — H43823 Vitreomacular adhesion, bilateral: Secondary | ICD-10-CM | POA: Diagnosis not present

## 2021-08-02 DIAGNOSIS — H469 Unspecified optic neuritis: Secondary | ICD-10-CM | POA: Diagnosis not present

## 2021-08-02 DIAGNOSIS — H2512 Age-related nuclear cataract, left eye: Secondary | ICD-10-CM | POA: Diagnosis not present

## 2021-08-04 ENCOUNTER — Other Ambulatory Visit: Payer: Self-pay | Admitting: Internal Medicine

## 2021-08-09 DIAGNOSIS — H469 Unspecified optic neuritis: Secondary | ICD-10-CM | POA: Diagnosis not present

## 2021-08-21 NOTE — Progress Notes (Signed)
CC: Hand Pain and Locking  HPI:   [name] is a 71 y.o. female with past medical history of obesity, hypertension, diabetes, TIA, neuromyelitis optica, and ventricular tachycardia who presents for episodes of finger locking and pain.  Last seen at [facility] on May 3.      Past Medical History:  Diagnosis Date   Abdominal pain 05/22/2014   Allergic rhinitis 04/16/2015   Anxiety state, unspecified    ARTHRALGIA UNSPECIFIED SITE 12/04/2008   Qualifier: Diagnosis of  By: Melvyn Novas MD, Christena Deem    Arthritis    Atherosclerosis of abdominal aorta ([place]) 10/01/2015   Aortoiliac atherosclerosis noted on CT abdomen in 2016.   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 01/2005 Echo: EF 55-65%   Osteopenia    Other and unspecified hyperlipidemia    Palpitations    Plantar fasciitis, bilateral 06/12/2016   Plantar fasciitis, bilateral 06/12/2016   Rash 03/23/2017   Right ankle injury, initial encounter 06/12/2016   Right elbow pain 06/11/2017   Right leg pain 09/06/2016   Right optic neuritis 01/22/2018   Sciatica 01/15/2018   Shoulder pain, bilateral (Concern for CAD) 08/28/2018   Shoulder pain, bilateral (Concern for CAD) 1/70/0174   Umbilical hernia    Unspecified essential hypertension    Vaginal candidiasis 02/12/2018   VENTRICULAR TACHYCARDIA 12/04/2008   F/u  [facility] Hochrein     Viral upper respiratory tract infection 01/15/2018   Vitamin D deficiency 02/12/2018     Review of  Systems:    Reports severe intermittent pain and digit locking Denies fever, recent illness, swelling, redness, tingling, numbness   Physical Exam:  Vitals:   08/22/21 1027  BP: 125/76  Pulse: 65  Temp: 98 F (36.7 C)  TempSrc: Oral  SpO2: 96%  Weight: 206 lb 6.4 oz (93.6 kg)  Height: '5\' 2"'$  (1.575 m)    General:   awake and alert, sitting comfortably in chair, cooperative, not in acute distress, accompanied by niece Lungs:   normal respiratory effort, breathing unlabored, symmetrical chest rise, no crackles or wheezing Cardiac:   regular rate and rhythm, normal S1 and S2 Musculoskeletal:   full range of motion in joints, motor strength 5 /5 in grip and pointer-pinky finger abduction, full ROM in both hands and all fingers bilaterally Psychiatric:   mood and affect normal, intelligible speech    Assessment & Plan:   Diabetes mellitus ([place]) Patient's last A1c was 6.8 in May 2023. She takes empagliflozin daily.  Today in clinic her A1c was 6.8.  Currently at goal of less than 7.0.   -Continue empagliflozin    Hyperlipidemia Last lipid profile was performed in February 2023, it revealed elevated values except HDL which was low.  Currently takes atorvastatin 40 mg daily for secondary prevention after experiencing a TIA in 2014.  -Increase atorvastatin to 80 mg daily, and repeat lipid panel in 3 months for LDL goal of less than 70   Bilateral  hand pain Patient reports several years of intermittent hand pain that is associated with locking of all five fingers on both hands.  Scribes the pain as extremely sharp, 10 out of 10 in intensity, and the finger is locked in an extended position, the thumb which is locked in a flexed position.  She will go months without any of these episodes, but then they will recur at a frequency of about once per week.  Has not tried any medications or interventions because the episodes last only about 5 to 6 minutes in duration. This also sometimes  happens in her feet.  She denies numbness, tingling, swelling, and redness. Radiographic imaging in the right hand showed degenerative changes in multiple joints, primarily the first CMC and MCP joints.  Generative changes were noted in multiple joints with small bony spurs on the left. None of these radiographic findings provide a satisfactory explanation for her episodes of pain and digit locking.  Etiology could be abnormal electrolytes levels including potassium, magnesium, and calcium.  -Ordered BMP with magnesium to identify any electrolyte abnormalities -Suggest warming her hands during these episodes for symptomatic relief -Urged patient to capture these episodes on video, which can help inform the diagnostic process     See Encounters Tab for problem based charting.  Patient seen with Dr. Philipp Ovens

## 2021-08-22 ENCOUNTER — Encounter: Payer: Medicare HMO | Admitting: Student

## 2021-08-22 ENCOUNTER — Other Ambulatory Visit: Payer: Self-pay

## 2021-08-22 ENCOUNTER — Ambulatory Visit (INDEPENDENT_AMBULATORY_CARE_PROVIDER_SITE_OTHER): Payer: Medicare HMO | Admitting: Student

## 2021-08-22 ENCOUNTER — Encounter: Payer: Self-pay | Admitting: Student

## 2021-08-22 VITALS — BP 125/76 | HR 65 | Temp 98.0°F | Ht 62.0 in | Wt 206.4 lb

## 2021-08-22 DIAGNOSIS — E119 Type 2 diabetes mellitus without complications: Secondary | ICD-10-CM | POA: Diagnosis not present

## 2021-08-22 DIAGNOSIS — E785 Hyperlipidemia, unspecified: Secondary | ICD-10-CM | POA: Diagnosis not present

## 2021-08-22 DIAGNOSIS — R252 Cramp and spasm: Secondary | ICD-10-CM | POA: Diagnosis not present

## 2021-08-22 DIAGNOSIS — Z7984 Long term (current) use of oral hypoglycemic drugs: Secondary | ICD-10-CM | POA: Diagnosis not present

## 2021-08-22 DIAGNOSIS — M79642 Pain in left hand: Secondary | ICD-10-CM

## 2021-08-22 DIAGNOSIS — M79641 Pain in right hand: Secondary | ICD-10-CM

## 2021-08-22 LAB — POCT GLYCOSYLATED HEMOGLOBIN (HGB A1C): Hemoglobin A1C: 6.8 % — AB (ref 4.0–5.6)

## 2021-08-22 LAB — GLUCOSE, CAPILLARY: Glucose-Capillary: 137 mg/dL — ABNORMAL HIGH (ref 70–99)

## 2021-08-22 MED ORDER — ATORVASTATIN CALCIUM 40 MG PO TABS: 80.0000 mg | ORAL_TABLET | Freq: Every day | ORAL | 11 refills | Status: DC

## 2021-08-22 NOTE — Patient Instructions (Signed)
  Thank you, [name], for allowing [name] to provide your care today. Today we discussed . . .  > Hand Pain       - we are checking your electrolytes, which may explain these episodes       - we may want to eventually refer you to a specialist > Diabetes       - we are checking your A1C today, please continue to take your empagliflozin > High Cholesterol       - we have increased your atorvastatin dose to '80mg'$  daily and will recheck your lipids in three months   I have ordered the following labs for you:   Lab Orders         BMP8+Anion Gap         Magnesium         POC Hbg A1C       Tests ordered today:  None   Referrals ordered today:   Referral Orders  No referral(s) requested today      I have ordered the following medication/changed the following medications:   Stop the following medications: Medications Discontinued During This Encounter  Medication Reason   atorvastatin (LIPITOR) 40 MG tablet Reorder     Start the following medications: Meds ordered this encounter  Medications   atorvastatin (LIPITOR) 40 MG tablet    Sig: Take 2 tablets (80 mg total) by mouth daily.    Dispense:  30 tablet    Refill:  11      Follow up: 3 months    Remember:  Please continue to take your atorvastatin and empagliflozin. We will call you about your electrolyte results. We will see you back in about three months!    Should you have any questions or concerns please call the internal medicine clinic at [phone].     Roswell Nickel, MD [place]

## 2021-08-22 NOTE — Assessment & Plan Note (Signed)
Patient's last A1c was 6.8 in May 2023. She takes empagliflozin daily.  Today in clinic her A1c was 6.8.  Currently at goal of less than 7.0.   -Continue empagliflozin

## 2021-08-22 NOTE — Assessment & Plan Note (Signed)
Patient reports several years of intermittent hand pain that is associated with locking of all five fingers on both hands.  Scribes the pain as extremely sharp, 10 out of 10 in intensity, and the finger is locked in an extended position, the thumb which is locked in a flexed position.  She will go months without any of these episodes, but then they will recur at a frequency of about once per week.  Has not tried any medications or interventions because the episodes last only about 5 to 6 minutes in duration. This also sometimes happens in her feet.  She denies numbness, tingling, swelling, and redness. Radiographic imaging in the right hand showed degenerative changes in multiple joints, primarily the first CMC and MCP joints.  Generative changes were noted in multiple joints with small bony spurs on the left. None of these radiographic findings provide a satisfactory explanation for her episodes of pain and digit locking.  Etiology could be abnormal electrolytes levels including potassium, magnesium, and calcium.  -Ordered BMP with magnesium to identify any electrolyte abnormalities -Suggest warming her hands during these episodes for symptomatic relief -Urged patient to capture these episodes on video, which can help inform the diagnostic process

## 2021-08-22 NOTE — Assessment & Plan Note (Signed)
Last lipid profile was performed in February 2023, it revealed elevated values except HDL which was low.  Currently takes atorvastatin 40 mg daily for secondary prevention after experiencing a TIA in 2014.  -Increase atorvastatin to 80 mg daily, and repeat lipid panel in 3 months for LDL goal of less than 70

## 2021-08-23 LAB — BMP8+ANION GAP
Anion Gap: 16 mmol/L (ref 10.0–18.0)
BUN/Creatinine Ratio: 12 (ref 12–28)
BUN: 13 mg/dL (ref 8–27)
CO2: 19 mmol/L — ABNORMAL LOW (ref 20–29)
Calcium: 9.8 mg/dL (ref 8.7–10.3)
Chloride: 104 mmol/L (ref 96–106)
Creatinine, Ser: 1.05 mg/dL — ABNORMAL HIGH (ref 0.57–1.00)
Glucose: 137 mg/dL — ABNORMAL HIGH (ref 70–99)
Potassium: 4.3 mmol/L (ref 3.5–5.2)
Sodium: 139 mmol/L (ref 134–144)
eGFR: 56 mL/min/{1.73_m2} — ABNORMAL LOW (ref >59)

## 2021-08-23 LAB — MAGNESIUM: Magnesium: 2 mg/dL (ref 1.6–2.3)

## 2021-08-23 NOTE — Addendum Note (Signed)
Addended by: Jodean Lima on: 08/23/2021 07:53 AM   Modules accepted: Orders

## 2021-08-23 NOTE — Addendum Note (Signed)
Addended by: Jodean Lima on: 08/23/2021 09:07 AM   Modules accepted: Orders

## 2021-08-23 NOTE — Progress Notes (Addendum)
Internal Medicine Clinic Attending  I saw and evaluated the patient.  I personally confirmed the key portions of the history and exam documented by Dr. Jodi Mourning and I reviewed pertinent patient test results.  The assessment, diagnosis, and plan were formulated together and I agree with the documentation in the resident's note.   Also considered tetany however calcium is WNL on BMP.

## 2021-08-29 ENCOUNTER — Other Ambulatory Visit: Payer: Self-pay | Admitting: Internal Medicine

## 2021-08-29 DIAGNOSIS — Z1231 Encounter for screening mammogram for malignant neoplasm of breast: Secondary | ICD-10-CM

## 2021-08-29 NOTE — Addendum Note (Signed)
Addended by: Serita Butcher on: 08/29/2021 12:21 PM   Modules accepted: Orders

## 2021-09-01 ENCOUNTER — Ambulatory Visit: Payer: Medicare HMO | Admitting: Orthopedic Surgery

## 2021-09-20 ENCOUNTER — Ambulatory Visit: Payer: Medicare HMO

## 2021-09-20 ENCOUNTER — Ambulatory Visit (INDEPENDENT_AMBULATORY_CARE_PROVIDER_SITE_OTHER): Payer: Medicare HMO

## 2021-09-20 DIAGNOSIS — Z0000 Encounter for general adult medical examination without abnormal findings: Secondary | ICD-10-CM

## 2021-09-20 NOTE — Progress Notes (Unsigned)
Subjective:   Michelle Hurley is a 71 y.o. female who presents for Medicare Annual (Subsequent) preventive examination. I connected with  Elon Spanner on 09/21/21 by a audio enabled telemedicine application and verified that I am speaking with the correct person using two identifiers.  Patient Location: Home  Provider Location: Office/Clinic  I discussed the limitations of evaluation and management by telemedicine. The patient expressed understanding and agreed to proceed.   Review of Systems    DEFERRED TO PCP  Cardiac Risk Factors include: advanced age (>65mn, >>78women);diabetes mellitus;hypertension;dyslipidemia     Objective:    Today's Vitals   09/20/21 0853  PainSc: 0-No pain   There is no height or weight on file to calculate BMI.     09/20/2021    8:58 AM 08/22/2021   10:24 AM 05/11/2021    9:17 AM 02/09/2021    2:01 PM 08/26/2020    2:02 PM 04/02/2020   10:45 AM 03/30/2020    9:56 AM  Advanced Directives  Does Patient Have a Medical Advance Directive? Yes Yes Yes No;Yes No Yes Yes  Type of Advance Directive Living will [place] will  [place] will [place] will  Does patient want to make changes to medical advance directive?  No - Patient declined       Copy of [place] Chart?  Yes - validated most recent copy scanned in chart (See row information) Yes - validated most recent copy scanned in chart (See row information) Yes - validated most recent copy scanned in chart (See row information)  Yes - validated most recent copy scanned in chart (See row information) Yes - validated most recent copy scanned in chart (See row information)  Would patient like information on creating a medical advance directive?    No - Patient declined No - Patient declined      Current Medications  (verified) Outpatient Encounter Medications as of 09/20/2021  Medication Sig   acetaminophen (TYLENOL) 500 MG tablet Take 500 mg by mouth every 6 (six) hours as needed.   aspirin EC 81 MG tablet Take 81 mg by mouth daily. Swallow whole.   atorvastatin (LIPITOR) 40 MG tablet Take 2 tablets (80 mg total) by mouth daily.   calcium-vitamin D 250-100 MG-UNIT tablet Take 1 tablet by mouth 2 (two) times daily.   carvedilol (COREG) 25 MG tablet TAKE 1 TABLET TWICE DAILY WITH MEALS   cyclobenzaprine (FLEXERIL) 5 MG tablet TAKE 1 TABLET THREE TIMES DAILY AS NEEDED FOR MUSCLE SPASM(S)   diclofenac Sodium (VOLTAREN) 1 % GEL APPLY 2 GRAMS TOPICALLY 4 (FOUR) TIMES DAILY AS NEEDED.   DULoxetine (CYMBALTA) 60 MG capsule TAKE 1 CAPSULE EVERY DAY   furosemide (LASIX) 40 MG tablet TAKE 1 TABLET EVERY DAY   gabapentin (NEURONTIN) 300 MG capsule TAKE 1 CAPSULE THREE TIMES DAILY   Heating Pads (SUNBEAM STANDARD HEATING PAD) PADS Please use as directed on the instructions included.   JARDIANCE 10 MG TABS tablet TAKE 1 TABLET EVERY DAY   levocetirizine (XYZAL) 5 MG tablet TAKE 1 TABLET EVERY EVENING   polyethylene glycol powder (GLYCOLAX/MIRALAX) powder Take 17 g by mouth daily as needed for moderate constipation or severe constipation.   RITUXAN 500 MG/50ML injection Infuse '1000mg'$  day 1 & '1000mg'$  day 15, every 6 months   spironolactone (ALDACTONE) 25 MG tablet TAKE 1 TABLET EVERY DAY   verapamil (  CALAN) 120 MG tablet TAKE 1/2 TABLET THREE TIMES DAILY   No facility-administered encounter medications on file as of 09/20/2021.    Allergies (verified) Mushroom extract complex, Codeine, Hydrocodone, Prednisone, and Omeprazole   History: Past Medical History:  Diagnosis Date   Abdominal pain 05/22/2014   Allergic rhinitis 04/16/2015   Anxiety state, unspecified    ARTHRALGIA UNSPECIFIED SITE 12/04/2008   Qualifier: Diagnosis of  By: Melvyn Novas MD, Christena Deem    Arthritis    Atherosclerosis of abdominal aorta ([place])  10/01/2015   Aortoiliac atherosclerosis noted on CT abdomen in 2016.   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 01/2005 Echo: EF 55-65%   Osteopenia    Other and unspecified hyperlipidemia    Palpitations    Plantar fasciitis, bilateral 06/12/2016   Plantar fasciitis, bilateral 06/12/2016   Rash 03/23/2017   Right ankle injury, initial encounter 06/12/2016   Right elbow pain 06/11/2017   Right leg pain 09/06/2016   Right optic neuritis 01/22/2018   Sciatica 01/15/2018   Shoulder pain, bilateral (Concern for CAD) 08/28/2018   Shoulder pain, bilateral (Concern for CAD) 5/40/9811   Umbilical hernia    Unspecified essential hypertension    Vaginal candidiasis 02/12/2018   VENTRICULAR TACHYCARDIA 12/04/2008   F/u  [facility]     Viral upper respiratory tract infection 01/15/2018   Vitamin D deficiency 02/12/2018   Past Surgical History:  Procedure Laterality Date   ABDOMINAL HYSTERECTOMY  1974   ESOPHAGOGASTRODUODENOSCOPY N/A 05/28/2014   Procedure: ESOPHAGOGASTRODUODENOSCOPY (EGD);  Surgeon: Teena Irani, MD;  Location: [facility];  Service: Endoscopy;  Laterality: N/A;   LAPAROSCOPIC SMALL BOWEL RESECTION N/A 06/04/2014   Procedure: LAPAROSCOPIC SMALL BOWEL RESECTION;  Surgeon: Fanny Skates, MD;  Location: [facility];  Service: General;  Laterality: N/A;   LAPAROSCOPIC TRANSABDOMINAL HERNIA N/A 05/03/2014   Procedure: LAPAROSCOPIC EXPLORATION WITH OPEN REPAIR OF INCARCERATED  EPIGASTRIC HERNIA ;  Surgeon: Alphonsa Overall, MD;  Location: [facility];  Service: General;  Laterality: N/A;   Family History  Problem Relation Age of Onset   Heart disease Mother    Diabetes Mother    Lung cancer Mother    Hypertension Brother    Heart attack Brother    Diabetes Sister    Breast cancer Sister    Diabetes Sister    Social History   Socioeconomic History   Marital status: Divorced    Spouse name: Not on file   Number of children: 2   Years of education: college   Highest education level: Not on file  Occupational History   Occupation: Retired  Tobacco Use   Smoking status: Never   Smokeless tobacco: Never  Vaping Use   Vaping Use: Never used  Substance and Sexual Activity   Alcohol use: No    Alcohol/week: 0.0 standard drinks of alcohol   Drug use: No   Sexual activity: Not on file  Other Topics Concern   Not on file  Social History Narrative   Current Social History 04/02/2020        Patient lives alone in an apartment which is 1 story. There are not steps up to the entrance.       Patient's method of transportation is personal car.      The highest level of education was some college.      The patient currently retired.      Identified important Relationships are "God and family"       Pets : None       Interests / Fun: Travel with her sisters, although this has stopped d/t Covid       Current Stressors: Right shoulder to lower-mid back pain X 3-4 months        Social Determinants of Health   Financial Resource Strain: Low Risk  (09/20/2021)   Overall Financial Resource Strain (CARDIA)    Difficulty of Paying Living Expenses: Not hard at all  Food Insecurity: Food Insecurity Present (09/20/2021)   Hunger Vital Sign    Worried About Running Out of Food in the Last Year: Sometimes true    Ran Out of Food in the Last Year: Sometimes true  Transportation Needs: No Transportation Needs (09/20/2021)   PRAPARE - Radiographer, therapeutic (Medical): No    Lack of Transportation (Non-Medical): No  Physical Activity: Insufficiently Active (09/20/2021)   Exercise Vital Sign    Days of Exercise per Week: 3 days    Minutes of Exercise per Session: 40 min  Stress: No Stress Concern Present (09/20/2021)   [place]    Feeling of Stress : Not at all  Social Connections: Moderately Isolated (09/20/2021)   Social Connection and Isolation Panel [NHANES]    Frequency of Communication with Friends and Family: More than three times a week    Frequency of Social Gatherings with Friends and Family: Once a week    Attends Religious Services: More than 4 times per year    Active Member of [employer] or Organizations: No    Attends Music therapist: Never    Marital Status: Divorced    Tobacco Counseling Counseling given: Not Answered   Clinical Intake:  Pre-visit preparation completed: Yes  Pain : No/denies pain Pain Score: 0-No pain     Diabetes: Yes Did pt. bring in CBG monitor from home?: No  How often do you need to have someone help you when you read instructions, pamphlets, or other written materials from your doctor or pharmacy?: 1 - Never What is the last grade level you completed in school?: SOME COLLEGE  Diabetic? YES   Interpreter Needed?: No  Information entered by :: Anasia Agro   Activities of Daily Living    09/20/2021    9:00 AM 08/22/2021   10:25 AM  In your present state of health, do you have any difficulty performing the following activities:  Hearing? 0 0  Vision? 0 0  Difficulty concentrating or making decisions? 0 0  Walking or climbing stairs? 1 1  Comment CLIMBING  STAIRS AT TIMES  Dressing or bathing? 0 0  Doing errands, shopping? 0 0  Preparing Food and eating ? N   Using the Toilet? N   In the past six months, have you accidently leaked urine? N   Do you have problems with loss of bowel  control? N   Managing your Medications? N   Managing your Finances? N  Housekeeping or managing your Housekeeping? N     Patient Care Team: Charise Killian, MD as PCP - General (Internal Medicine) Jerline Pain, MD as PCP - Cardiology (Cardiology) Tresa Garter, MD (Internal Medicine) Warren Danes, PA-C as Physician Assistant (Dermatology) Lavonna Monarch, MD as Consulting Physician (Dermatology) Penni Bombard, MD as Consulting Physician (Neurology)  Indicate any recent Medical Services you may have received from other than [facility] providers in the past year (date may be approximate).     Assessment:   This is a routine wellness examination for [place].  Hearing/Vision screen No results found.  Dietary issues and exercise activities discussed:     Goals Addressed   None   Depression Screen    09/20/2021    8:57 AM 08/22/2021   10:25 AM 05/11/2021    9:22 AM 03/30/2021    9:23 AM 08/26/2020    4:36 PM 07/20/2020   11:08 AM 04/20/2020   10:19 AM  PHQ 2/9 Scores  PHQ - 2 Score 0 0 0 0 0 0 0  PHQ- 9 Score       1    Fall Risk    09/20/2021    8:59 AM 08/22/2021   10:25 AM 05/11/2021    9:17 AM 03/30/2021    9:19 AM 02/09/2021    2:00 PM  [place] in the past year? 0 0 0 0 0  Number falls in past yr: 0  0 0 0  Injury with Fall? 0  0 0 0  Risk for fall due to : No Fall Risks No Fall Risks No Fall Risks No Fall Risks No Fall Risks  Follow up Falls evaluation completed;Falls prevention discussed Falls evaluation completed Falls evaluation completed;Falls prevention discussed Falls evaluation completed;Falls prevention discussed Falls evaluation completed;Falls prevention discussed    FALL RISK PREVENTION PERTAINING TO THE HOME:  Any stairs in or around the home? No  If so, are there any without handrails? No  Home free of loose throw rugs in walkways, pet beds, electrical cords, etc? No  Adequate lighting in your home to reduce risk of falls? YES    ASSISTIVE DEVICES UTILIZED TO PREVENT FALLS:  Life alert? No  Use of a cane, walker or w/c? No  Grab bars in the bathroom? Yes  Shower chair or bench in shower? No  Elevated toilet seat or a handicapped toilet? Yes   TIMED UP AND GO:  Was the test performed? No .  Length of time to ambulate 10 feet: N/A sec.     Cognitive Function:        09/20/2021    9:02 AM  6CIT Screen  What Year? 0 points  What month? 0 points  What time? 0 points  Count back from 20 0 points  Months in reverse 0 points  Repeat phrase 0 points  Total Score 0 points    Immunizations Immunization History  Administered Date(s) Administered   Influenza,inj,Quad PF,6+ Mos 10/07/2013, 10/01/2015, 01/15/2018, 09/25/2019   Moderna Sars-Covid-2 Vaccination 03/17/2019, 04/18/2019, 12/18/2019   Pneumococcal Conjugate-13 10/07/2013   Pneumococcal Polysaccharide-23 04/16/2015   Tdap 04/06/2017    TDAP status: Up to date  Flu Vaccine status: Due, Education has been provided regarding the importance of this vaccine. Advised may receive this vaccine at local pharmacy or Health Dept. Aware to provide a copy of the vaccination record if obtained from local pharmacy or Health Dept. Verbalized acceptance and understanding.  Pneumococcal vaccine status: Up to date  Covid-19 vaccine status: Completed vaccines  Qualifies for Shingles Vaccine? Yes   Zostavax completed No   Shingrix Completed?: No.    Education has been provided regarding the importance of this vaccine. Patient has been advised to call insurance company to determine out of pocket expense if they have not yet received this vaccine. Advised may also receive vaccine at local pharmacy or Health Dept. Verbalized acceptance and understanding.  Screening Tests Health Maintenance  Topic Date Due   Zoster Vaccines- Shingrix (1 of 2) Never done   COVID-19 Vaccine (4 - Moderna series) 02/12/2020   INFLUENZA VACCINE  08/09/2021   OPHTHALMOLOGY EXAM   01/10/2022   HEMOGLOBIN A1C  02/22/2022   Diabetic kidney evaluation - Urine ACR  05/12/2022   FOOT EXAM  05/12/2022   Diabetic kidney evaluation - GFR measurement  08/23/2022   MAMMOGRAM  10/01/2022   COLONOSCOPY (Pts 45-74yr Insurance coverage will need to be confirmed)  10/16/2022   TETANUS/TDAP  04/07/2027   Pneumonia Vaccine 71 Years old  Completed   DEXA SCAN  Completed   Hepatitis C Screening  Completed   HPV VACCINES  Aged Out    Health Maintenance  Health Maintenance Due  Topic Date Due   Zoster Vaccines- Shingrix (1 of 2) Never done   COVID-19 Vaccine (4 - Moderna series) 02/12/2020   INFLUENZA VACCINE  08/09/2021    Colorectal cancer screening: Type of screening: Colonoscopy. Completed 1007/20214. Repeat every 10 years  Mammogram status: Completed 09/30/2021. Repeat every 10 year  Bone Density status: Completed 08/09/2020. Results reflect: Bone density results: NORMAL. Repeat every ONCE  years.  Lung Cancer Screening: (Low Dose CT Chest recommended if Age 73-80years, 30 pack-year currently smoking OR have quit w/in 15years.) does not qualify.   Lung Cancer Screening Referral: DEFERRED TO PCP   Additional Screening:  Hepatitis C Screening: does qualify; Completed 02/27/2018  Vision Screening: Recommended annual ophthalmology exams for early detection of glaucoma and other disorders of the eye. Is the patient up to date with their annual eye exam?  Yes  Who is the provider or what is the name of the office in which the patient attends annual eye exams?  [facility]  If pt is not established with a provider, would they like to be referred to a provider to establish care? No .   Dental Screening: Recommended annual dental exams for proper oral hygiene  Community Resource Referral / Chronic Care Management: CRR required this visit?  No   CCM required this visit?  No      Plan:     I have personally reviewed and noted the following in the patient's chart:    Medical and social history Use of alcohol, tobacco or illicit drugs  Current medications and supplements including opioid prescriptions. Patient is currently taking opioid prescriptions. Information provided to patient regarding non-opioid alternatives. Patient advised to discuss non-opioid treatment plan with their provider. Functional ability and status Nutritional status Physical activity Advanced directives List of other physicians Hospitalizations, surgeries, and ER visits in previous 12 months Vitals Screenings to include cognitive, depression, and falls Referrals and appointments  In addition, I have reviewed and discussed with patient certain preventive protocols, quality metrics, and best practice recommendations. A written personalized care plan for preventive services as well as general preventive health recommendations were provided to patient.     aJudyann Munson CMA   09/21/2021   Nurse Notes: NON FACE TO FACE    Ms. GLindstrom,  Thank you for taking time to come for your Medicare Wellness Visit. I appreciate your ongoing commitment to your health goals. Please review the following plan we discussed and let me know if I can assist you in the future.   These are the goals we discussed:  Goals      Weight Loss     Lose 30lbs over the next year        This is a list of the screening recommended for you and due dates:  Health Maintenance  Topic Date Due   Zoster (Shingles) Vaccine (1 of 2) Never done   COVID-19 Vaccine (4 - Moderna series) 02/12/2020   Flu Shot  08/09/2021   Eye exam for diabetics  01/10/2022   Hemoglobin A1C  02/22/2022   Yearly kidney health urinalysis for diabetes  05/12/2022   Complete foot exam   05/12/2022   Yearly kidney function blood test for diabetes  08/23/2022   Mammogram  10/01/2022   Colon Cancer Screening  10/16/2022   Tetanus Vaccine  04/07/2027   Pneumonia Vaccine  Completed   DEXA scan (bone density measurement)  Completed    Hepatitis C Screening: USPSTF Recommendation to screen - Ages 54-79 yo.  Completed   HPV Vaccine  Aged Out

## 2021-09-20 NOTE — Progress Notes (Signed)
I reviewed the AWV findings from the provider who conducted the visit. I was present in the office suite and immediately available to provide assistance and direction throughout the time the service was provided. Last mammogram 09/30/2020, repeat annually and scheduled for 10/04/21. Last colonoscopy 01/27/2015 with [facility] per chart review, repeat in 10 years.

## 2021-09-26 ENCOUNTER — Ambulatory Visit (INDEPENDENT_AMBULATORY_CARE_PROVIDER_SITE_OTHER): Payer: Medicare HMO

## 2021-09-26 ENCOUNTER — Ambulatory Visit (INDEPENDENT_AMBULATORY_CARE_PROVIDER_SITE_OTHER): Payer: Medicare HMO | Admitting: Orthopedic Surgery

## 2021-09-26 ENCOUNTER — Ambulatory Visit: Payer: Self-pay

## 2021-09-26 DIAGNOSIS — M25562 Pain in left knee: Secondary | ICD-10-CM

## 2021-09-26 DIAGNOSIS — M79641 Pain in right hand: Secondary | ICD-10-CM | POA: Diagnosis not present

## 2021-09-26 DIAGNOSIS — M79642 Pain in left hand: Secondary | ICD-10-CM

## 2021-09-27 ENCOUNTER — Encounter: Payer: Self-pay | Admitting: Orthopedic Surgery

## 2021-09-27 NOTE — Progress Notes (Signed)
Office Visit Note   Patient: Michelle Hurley           Date of Birth: 1950/01/25           MRN: [id number] Visit Date: 09/26/2021 Requested by: Sid Falcon, MD [place],  [place] [id number] PCP: Charise Killian, MD  Subjective: Chief Complaint  Patient presents with   Left Knee - Pain   Right Hand - Pain   Left Hand - Pain    HPI: Patient presents for evaluation of bilateral hand pain.  She states that last year on several occasions she developed spasm which caused her fingers and toes to both extend.  This has not happened for a year but has happened once recently.  Her fingers bent back after her hands "freeze up".  Same thing happens with her toes when they extend.  This happens in both hands at the same time.  Most recently it happened when she was just initiating working with collard greens.  Reports cramping up hand pain.  She has had blood work including magnesium level which was normal.  She also describes left knee pain.  She also takes Neurontin for left leg "sciatic pain".              ROS: All systems reviewed are negative as they relate to the chief complaint within the history of present illness.  Patient denies  fevers or chills.   Assessment & Plan: Visit Diagnoses:  1. Bilateral hand pain   2. Left knee pain, unspecified chronicity     Plan: Impression is atypical hand and feet cramping.  Does not really look like carpal tunnel or nerve compression.  She is very functional with her hands and feet for the majority of the time.  Her laboratory values are unremarkable.  Both hands and feet are well perfused and sensate.  Plan at this time is consider using a topical such as cramp 911 when these events occur.  I do not think any further work-up is indicated at this time.  Regarding her left knee she may have a degenerative meniscal tear.  We could consider an injection as needed into the left knee for pain that is refractory to over-the-counter treatment.  She will  follow-up as needed.  Follow-Up Instructions: No follow-ups on file.   Orders:  Orders Placed This Encounter  Procedures   XR KNEE 3 VIEW LEFT   XR Hand Complete Left   XR Hand Complete Right   No orders of the defined types were placed in this encounter.     Procedures: No procedures performed   Clinical Data: No additional findings.  Objective: Vital Signs: LMP 04/05/2012   Physical Exam:   Constitutional: Patient appears well-developed HEENT:  Head: Normocephalic Eyes:EOM are normal Neck: Normal range of motion Cardiovascular: Normal rate Pulmonary/chest: Effort normal Neurologic: Patient is alert Skin: Skin is warm Psychiatric: Patient has normal mood and affect   Ortho Exam: Ortho exam demonstrates full composite flexion extension of fingers and thumbs on both hands.  Radial pulse intact bilaterally.  Negative Tinel's and no subluxating ulnar nerve at the cubital tunnel bilaterally.  Wrist flexion extension is full and intact.  Grip strength is symmetric bilaterally.  No masses lymphadenopathy or skin changes noted in either hand region.  No muscle atrophy in the hands.  Left knee demonstrates trace effusion but full range of motion with stable collateral cruciate ligaments.  Intact extensor mechanism.  Palpable pedal pulses in both feet.  Intact sensation on the dorsal and plantar aspect of both feet.  She has stable patellas.  No joint line tenderness.  Patella tracks well with minimal crepitus.  Specialty Comments:  No specialty comments available.  Imaging: No results found.   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   Estrogen deficiency 11/16/2016   Atherosclerosis of abdominal aorta ([place]) 10/01/2015   Healthcare maintenance 04/16/2015   Allergic rhinitis 04/16/2015   Hx-TIA (transient ischemic attack) 04/06/2012   VENTRICULAR TACHYCARDIA 12/04/2008   Hyperlipidemia 12/24/2006   OBESITY, MORBID 12/24/2006   Essential hypertension 12/24/2006   Past Medical History:  Diagnosis Date   Abdominal pain 05/22/2014   Allergic rhinitis 04/16/2015   Anxiety state, unspecified    ARTHRALGIA UNSPECIFIED SITE 12/04/2008   Qualifier: Diagnosis of  By: Melvyn Novas MD, Christena Deem    Arthritis    Atherosclerosis of abdominal aorta ([place]) 10/01/2015   Aortoiliac atherosclerosis noted on CT abdomen in 2016.   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 01/2005 Echo: EF 55-65%   Osteopenia    Other and unspecified hyperlipidemia    Palpitations    Plantar fasciitis, bilateral 06/12/2016   Plantar fasciitis, bilateral 06/12/2016   Rash 03/23/2017   Right ankle injury, initial encounter 06/12/2016   Right elbow pain 06/11/2017   Right leg pain 09/06/2016   Right optic neuritis 01/22/2018   Sciatica 01/15/2018   Shoulder pain, bilateral (Concern for CAD) 08/28/2018   Shoulder pain, bilateral (Concern for CAD) 0/27/2536   Umbilical hernia    Unspecified essential hypertension    Vaginal candidiasis 02/12/2018   VENTRICULAR  TACHYCARDIA 12/04/2008   F/u  [facility]     Viral upper respiratory tract infection 01/15/2018   Vitamin D deficiency 02/12/2018    Family History  Problem Relation Age of Onset   Heart disease Mother    Diabetes Mother    Lung cancer Mother    Hypertension Brother    Heart attack Brother    Diabetes Sister    Breast cancer Sister    Diabetes Sister     Past Surgical History:  Procedure Laterality Date   ABDOMINAL HYSTERECTOMY  1974   ESOPHAGOGASTRODUODENOSCOPY N/A 05/28/2014   Procedure: ESOPHAGOGASTRODUODENOSCOPY (EGD);  Surgeon: Teena Irani, MD;  Location: [facility];  Service: Endoscopy;  Laterality: N/A;   LAPAROSCOPIC SMALL BOWEL RESECTION N/A 06/04/2014   Procedure: LAPAROSCOPIC SMALL BOWEL RESECTION;  Surgeon: Fanny Skates, MD;  Location: [facility];  Service: General;  Laterality: N/A;   LAPAROSCOPIC TRANSABDOMINAL HERNIA N/A 05/03/2014   Procedure: LAPAROSCOPIC  EXPLORATION WITH OPEN REPAIR OF INCARCERATED EPIGASTRIC HERNIA ;  Surgeon: Alphonsa Overall, MD;  Location: [facility];  Service: General;  Laterality: N/A;   Social History   Occupational History   Occupation: Retired  Tobacco Use   Smoking status: Never   Smokeless tobacco: Never  Vaping Use   Vaping Use: Never used  Substance and Sexual Activity   Alcohol use: No    Alcohol/week: 0.0 standard drinks of alcohol   Drug use: No   Sexual activity: Not on file

## 2021-10-04 ENCOUNTER — Ambulatory Visit
Admission: RE | Admit: 2021-10-04 | Discharge: 2021-10-04 | Disposition: A | Discharge status: Home / Self Care | Payer: Medicare HMO | Source: Ambulatory Visit | Attending: Internal Medicine | Admitting: Internal Medicine

## 2021-10-04 DIAGNOSIS — Z1231 Encounter for screening mammogram for malignant neoplasm of breast: Secondary | ICD-10-CM

## 2021-10-06 ENCOUNTER — Other Ambulatory Visit: Payer: Self-pay | Admitting: Internal Medicine

## 2021-10-06 DIAGNOSIS — R928 Other abnormal and inconclusive findings on diagnostic imaging of breast: Secondary | ICD-10-CM

## 2021-10-10 DIAGNOSIS — H25811 Combined forms of age-related cataract, right eye: Secondary | ICD-10-CM | POA: Diagnosis not present

## 2021-10-10 DIAGNOSIS — H2512 Age-related nuclear cataract, left eye: Secondary | ICD-10-CM | POA: Diagnosis not present

## 2021-10-10 DIAGNOSIS — H1013 Acute atopic conjunctivitis, bilateral: Secondary | ICD-10-CM | POA: Diagnosis not present

## 2021-10-10 DIAGNOSIS — H469 Unspecified optic neuritis: Secondary | ICD-10-CM | POA: Diagnosis not present

## 2021-10-10 DIAGNOSIS — H43823 Vitreomacular adhesion, bilateral: Secondary | ICD-10-CM | POA: Diagnosis not present

## 2021-10-19 ENCOUNTER — Ambulatory Visit
Admission: RE | Admit: 2021-10-19 | Discharge: 2021-10-19 | Disposition: A | Discharge status: Home / Self Care | Payer: Medicare HMO | Source: Ambulatory Visit | Attending: Internal Medicine | Admitting: Internal Medicine

## 2021-10-19 ENCOUNTER — Other Ambulatory Visit: Payer: Self-pay | Admitting: Internal Medicine

## 2021-10-19 DIAGNOSIS — N6320 Unspecified lump in the left breast, unspecified quadrant: Secondary | ICD-10-CM

## 2021-10-19 DIAGNOSIS — N6011 Diffuse cystic mastopathy of right breast: Secondary | ICD-10-CM | POA: Diagnosis not present

## 2021-10-19 DIAGNOSIS — N6002 Solitary cyst of left breast: Secondary | ICD-10-CM | POA: Diagnosis not present

## 2021-10-19 DIAGNOSIS — R921 Mammographic calcification found on diagnostic imaging of breast: Secondary | ICD-10-CM | POA: Diagnosis not present

## 2021-10-19 DIAGNOSIS — N6310 Unspecified lump in the right breast, unspecified quadrant: Secondary | ICD-10-CM

## 2021-10-19 DIAGNOSIS — R928 Other abnormal and inconclusive findings on diagnostic imaging of breast: Secondary | ICD-10-CM

## 2021-10-20 ENCOUNTER — Telehealth: Payer: Self-pay

## 2021-10-20 NOTE — Telephone Encounter (Signed)
Pt would like to know if she is updated with her immunization. Please call pt back.

## 2021-10-20 NOTE — Telephone Encounter (Signed)
RTC to patient. Question about immunizations needed.  Patient has received the Shingles and the Covid #4.  Patient to bring in information from the Pharmacy so that it can be put in her chart.  Patient needs a Flu vaccine for this year.  Plans to call back to reschedule appointment in November for follow up and to get her flu shot.

## 2021-10-31 ENCOUNTER — Ambulatory Visit
Admission: RE | Admit: 2021-10-31 | Discharge: 2021-10-31 | Disposition: A | Discharge status: Home / Self Care | Payer: Medicare HMO | Source: Ambulatory Visit | Attending: Internal Medicine | Admitting: Internal Medicine

## 2021-10-31 ENCOUNTER — Other Ambulatory Visit: Payer: Self-pay | Admitting: Internal Medicine

## 2021-10-31 DIAGNOSIS — N6320 Unspecified lump in the left breast, unspecified quadrant: Secondary | ICD-10-CM

## 2021-10-31 DIAGNOSIS — N6323 Unspecified lump in the left breast, lower outer quadrant: Secondary | ICD-10-CM | POA: Diagnosis not present

## 2021-10-31 DIAGNOSIS — N6082 Other benign mammary dysplasias of left breast: Secondary | ICD-10-CM | POA: Diagnosis not present

## 2021-10-31 HISTORY — PX: BREAST BIOPSY: SHX20

## 2021-11-14 ENCOUNTER — Ambulatory Visit
Admission: RE | Admit: 2021-11-14 | Discharge: 2021-11-14 | Disposition: A | Discharge status: Home / Self Care | Payer: Medicare HMO | Source: Ambulatory Visit | Attending: Internal Medicine | Admitting: Internal Medicine

## 2021-11-14 DIAGNOSIS — N6320 Unspecified lump in the left breast, unspecified quadrant: Secondary | ICD-10-CM

## 2021-12-27 ENCOUNTER — Ambulatory Visit: Payer: Medicare HMO | Admitting: Diagnostic Neuroimaging

## 2021-12-29 ENCOUNTER — Other Ambulatory Visit: Payer: Self-pay | Admitting: Internal Medicine

## 2021-12-29 DIAGNOSIS — E7800 Pure hypercholesterolemia, unspecified: Secondary | ICD-10-CM

## 2022-01-10 DIAGNOSIS — H469 Unspecified optic neuritis: Secondary | ICD-10-CM | POA: Diagnosis not present

## 2022-01-11 DIAGNOSIS — L304 Erythema intertrigo: Secondary | ICD-10-CM | POA: Diagnosis not present

## 2022-01-16 ENCOUNTER — Other Ambulatory Visit: Payer: Self-pay | Admitting: Student

## 2022-01-16 DIAGNOSIS — E785 Hyperlipidemia, unspecified: Secondary | ICD-10-CM

## 2022-01-19 ENCOUNTER — Telehealth: Payer: Self-pay | Admitting: Diagnostic Neuroimaging

## 2022-01-19 NOTE — Telephone Encounter (Signed)
Tracy from [facility] called needing clarification on the Rx RITUXAN 500 MG/50ML injection that was sent for the pt. They are needing to know if premed is needed. Please call [phone] or fax/escribe [phone]

## 2022-01-19 NOTE — Telephone Encounter (Signed)
Contacted [facility] back, spoke to [place], informed her pt gets infusion here in office. We give premeds here if needed, per [facility]. As of 06/29/21 Sig: Infuse '1000mg'$  day 1 & '1000mg'$  day 15, every 6 months .

## 2022-01-24 DIAGNOSIS — H469 Unspecified optic neuritis: Secondary | ICD-10-CM | POA: Diagnosis not present

## 2022-01-30 ENCOUNTER — Ambulatory Visit: Payer: Medicare HMO

## 2022-01-30 ENCOUNTER — Encounter: Payer: Medicare HMO | Admitting: Student

## 2022-01-30 NOTE — Patient Instructions (Incomplete)
HTN Coreg 25 mg twice daily verapamil 120 mg daily spironolactone 25 mg daily Lasix 40 mg daily.   DM Last A1c:  Lab Results  Component Value Date   HGBA1C 6.8 (A) 08/22/2021   Diet Exercise Last Cr and GFR: Last Urine microalbumin: 05/2021 <11 ARB/ACEi?: No SGLT2i?: Lipid panel: HTN? Controlled? Ophtho?  Foot/podiatry Current medications? Empagliflozin   Peripheral neuropathy  Refilled duloxetine 60 mg daily - Refilled Flexeril 5 mg as needed - Continue gabapentin 300 mg 3 times daily as needed  HLD Hx TIA in 2014 Lipid Panel     Component Value Date/Time   CHOL 203 (H) 02/09/2021 1526   TRIG 365 (H) 02/09/2021 1526   HDL 34 (L) 02/09/2021 1526   CHOLHDL 6.0 (H) 02/09/2021 1526   CHOLHDL 3.5 11/19/2014 1131   VLDL 19 11/19/2014 1131   LDLCALC 106 (H) 02/09/2021 1526   LDLDIRECT 182.8 04/27/2010 1037   LABVLDL 63 (H) 02/09/2021 1526  The 10-year ASCVD risk score (Arnett DK, et al., 2019) is: 26.3%   Values used to calculate the score:     Age: 72 years     Sex: Female     Is Non-Hispanic African American: Yes     Diabetic: Yes     Tobacco smoker: No     Systolic Blood Pressure: 045 mmHg     Is BP treated: Yes     HDL Cholesterol: 34 mg/dL     Total Cholesterol: 203 mg/dL Currently on: Atorvastatin 80 mg daily since 08/2021   Vit D def Last DEXA in 2022 P Spine L1-L4 is 1.084 g/cm2 with a T-score of -0.9. This patient is considered normal according to [facility]) criteria.   - Vitamin D

## 2022-02-03 ENCOUNTER — Ambulatory Visit (INDEPENDENT_AMBULATORY_CARE_PROVIDER_SITE_OTHER): Payer: Medicare HMO | Admitting: Student

## 2022-02-03 ENCOUNTER — Ambulatory Visit: Payer: Medicare HMO

## 2022-02-03 ENCOUNTER — Encounter: Payer: Self-pay | Admitting: Student

## 2022-02-03 VITALS — BP 133/84 | Temp 98.2°F | Ht 62.0 in | Wt 210.0 lb

## 2022-02-03 DIAGNOSIS — E1122 Type 2 diabetes mellitus with diabetic chronic kidney disease: Secondary | ICD-10-CM | POA: Diagnosis not present

## 2022-02-03 DIAGNOSIS — E119 Type 2 diabetes mellitus without complications: Secondary | ICD-10-CM | POA: Diagnosis not present

## 2022-02-03 DIAGNOSIS — N1831 Chronic kidney disease, stage 3a: Secondary | ICD-10-CM | POA: Diagnosis not present

## 2022-02-03 DIAGNOSIS — E785 Hyperlipidemia, unspecified: Secondary | ICD-10-CM

## 2022-02-03 DIAGNOSIS — G360 Neuromyelitis optica [Devic]: Secondary | ICD-10-CM | POA: Diagnosis not present

## 2022-02-03 DIAGNOSIS — E559 Vitamin D deficiency, unspecified: Secondary | ICD-10-CM

## 2022-02-03 DIAGNOSIS — I129 Hypertensive chronic kidney disease with stage 1 through stage 4 chronic kidney disease, or unspecified chronic kidney disease: Secondary | ICD-10-CM | POA: Diagnosis not present

## 2022-02-03 DIAGNOSIS — I10 Essential (primary) hypertension: Secondary | ICD-10-CM

## 2022-02-03 LAB — GLUCOSE, CAPILLARY: Glucose-Capillary: 232 mg/dL — ABNORMAL HIGH (ref 70–99)

## 2022-02-03 LAB — POCT GLYCOSYLATED HEMOGLOBIN (HGB A1C): Hemoglobin A1C: 7.3 % — AB (ref 4.0–5.6)

## 2022-02-03 NOTE — Assessment & Plan Note (Addendum)
Medication: Jardiance 10 mg daily, no GI or yeast infections recently Diet: Patient continues to have one big meal a day. She reports it is balanced with mostly vegetables and baked, lightly pan seared proteins. She does not eat much fruit. Has been snacking a lot since last visit, mostly on Baby Ruths daily (candy bar). She does this because is bored and does not know what else to do with her hands.  Exercise: had been quite active and likes to move. Thinks she is moving at least 150 min per week. Ophtho: patient went to the eye doctor last month at Dr. Zenia Resides office without abnormalities. She continues to see neurology for neuromyelitis optica and received rituximab infusions. UTD. No neuropathy or ulcer concerns today. No catabolic or hypovolemic symptoms.   Last A1c 08/2021: 6.8 Last Cr and GFR:08/2021 Cr 1.05 GFR56 Last Urine microalbumin:05/2021 -<11 HTN and HLD on treatment.  Lab Results  Component Value Date   HGBA1C 7.3 (A) 02/03/2022  A1c increased from prior, suspect this is 2/2 to dietary changes. Discussed healthy alternatives to snacks with patient. Discussed lifestyle modifications with activity to combat boredom. Offered GLP1 agonist medications to help with DM, cravings, and weight management, but patient declined. She does not like needles. After offering po GLP1 medication as well, patient shared preference for life-style modifications in her diet.  -A1c in 3 months -Continue Jardiance 10 mg -Lifestyle modifications

## 2022-02-03 NOTE — Progress Notes (Signed)
Subjective:  CC: follow up  HPI:  [name] is a 72 y.o. female with a past medical history stated below and presents today for hypertension, DM, HLD, and vitamin D deficiency follow up. Please see problem based assessment and plan for additional details.  Past Medical History:  Diagnosis Date   Abdominal pain 05/22/2014   Allergic rhinitis 04/16/2015   Anxiety state, unspecified    ARTHRALGIA UNSPECIFIED SITE 12/04/2008   Qualifier: Diagnosis of  By: Melvyn Novas MD, Christena Deem    Arthritis    Atherosclerosis of abdominal aorta ([place]) 10/01/2015   Aortoiliac atherosclerosis noted on CT abdomen in 2016.   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 01/2005 Echo: EF 55-65%   Osteopenia    Other and unspecified hyperlipidemia    Palpitations    Plantar fasciitis, bilateral 06/12/2016   Plantar fasciitis, bilateral 06/12/2016   Rash 03/23/2017   Right ankle injury, initial encounter 06/12/2016   Right elbow pain 06/11/2017   Right leg pain 09/06/2016   Right optic neuritis 01/22/2018   Sciatica 01/15/2018   Shoulder pain, bilateral (Concern for CAD) 08/28/2018   Shoulder pain, bilateral (Concern for CAD) 2/80/0349   Umbilical hernia    Unspecified essential hypertension    Vaginal candidiasis 02/12/2018   VENTRICULAR TACHYCARDIA 12/04/2008   F/u  [facility]     Viral upper respiratory tract infection 01/15/2018   Vitamin D deficiency 02/12/2018    Current Outpatient  Medications on File Prior to Visit  Medication Sig Dispense Refill   acetaminophen (TYLENOL) 500 MG tablet Take 500 mg by mouth every 6 (six) hours as needed.     aspirin EC 81 MG tablet Take 81 mg by mouth daily. Swallow whole.     atorvastatin (LIPITOR) 80 MG tablet Take 1 tablet (80 mg total) by mouth daily. 90 tablet 3   calcium-vitamin D 250-100 MG-UNIT tablet Take 1 tablet by mouth 2 (two) times daily. 30 tablet 3   carvedilol (COREG) 25 MG tablet TAKE 1 TABLET TWICE DAILY WITH MEALS 180 tablet 3   cyclobenzaprine (FLEXERIL) 5 MG tablet TAKE 1 TABLET THREE TIMES DAILY AS NEEDED FOR MUSCLE SPASM(S) 30 tablet 1   diclofenac Sodium (VOLTAREN) 1 % GEL APPLY 2 GRAMS TOPICALLY 4 (FOUR) TIMES DAILY AS NEEDED. 100 g 2   DULoxetine (CYMBALTA) 60 MG capsule TAKE 1 CAPSULE EVERY DAY 90 capsule 3   furosemide (LASIX) 40 MG tablet TAKE 1 TABLET EVERY DAY 90 tablet 1   gabapentin (NEURONTIN) 300 MG capsule TAKE 1 CAPSULE THREE TIMES DAILY 60 capsule 3   Heating Pads (SUNBEAM STANDARD HEATING PAD) PADS Please use as directed on the instructions included. 1 each 0   JARDIANCE 10 MG TABS tablet TAKE 1 TABLET EVERY DAY 90 tablet 3   levocetirizine (XYZAL) 5 MG tablet TAKE 1 TABLET EVERY EVENING 90 tablet 1   polyethylene glycol powder (GLYCOLAX/MIRALAX) powder Take 17 g by mouth daily as  needed for moderate constipation or severe constipation. 255 g 0   RITUXAN 500 MG/50ML injection Infuse '1000mg'$  day 1 & '1000mg'$  day 15, every 6 months 100 mL 1   spironolactone (ALDACTONE) 25 MG tablet TAKE 1 TABLET EVERY DAY 90 tablet 3   verapamil (CALAN) 120 MG tablet TAKE 1/2 TABLET THREE TIMES DAILY 135 tablet 3   No current facility-administered medications on file prior to visit.    Family History  Problem Relation Age of Onset   Heart disease Mother    Diabetes Mother    Lung cancer Mother    Hypertension Brother    Heart attack Brother    Diabetes Sister    Breast cancer Sister    Diabetes Sister      Social History   Socioeconomic History   Marital status: Divorced    Spouse name: Not on file   Number of children: 2   Years of education: college   Highest education level: Not on file  Occupational History   Occupation: Retired  Tobacco Use   Smoking status: Never   Smokeless tobacco: Never  Vaping Use   Vaping Use: Never used  Substance and Sexual Activity   Alcohol use: No    Alcohol/week: 0.0 standard drinks of alcohol   Drug use: No   Sexual activity: Not on file  Other Topics Concern   Not on file  Social History Narrative   Current Social History 04/02/2020        Patient lives alone in an apartment which is 1 story. There are not steps up to the entrance.       Patient's method of transportation is personal car.      The highest level of education was some college.      The patient currently retired.      Identified important Relationships are "God and family"       Pets : None       Interests / Fun: Travel with her sisters, although this has stopped d/t Covid       Current Stressors: Right shoulder to lower-mid back pain X 3-4 months        Social Determinants of Health   Financial Resource Strain: Low Risk  (09/20/2021)   Overall Financial Resource Strain (CARDIA)    Difficulty of Paying Living Expenses: Not hard at all  Food Insecurity: Food Insecurity Present (09/20/2021)   Hunger Vital Sign    Worried About Running Out of Food in the Last Year: Sometimes true    Ran Out of Food in the Last Year: Sometimes true  Transportation Needs: No Transportation Needs (09/20/2021)   PRAPARE - Hydrologist (Medical): No    Lack of Transportation (Non-Medical): No  Physical Activity: Insufficiently Active (09/20/2021)   Exercise Vital Sign    Days of Exercise per Week: 3 days    Minutes of Exercise per Session: 40 min  Stress: No Stress Concern Present (09/20/2021)   [place]    Feeling of Stress : Not at all  Social Connections: Moderately Isolated (09/20/2021)   Social Connection and Isolation Panel [NHANES]    Frequency of Communication with Friends and Family: More than three times a week    Frequency of Social Gatherings with Friends and Family: Once a week    Attends Religious Services: More than 4 times per year    Active Member of Clubs or Organizations: No  Attends Archivist Meetings: Never    Marital Status: Divorced  Human resources officer Violence: Not At Risk (09/20/2021)   Humiliation, Afraid, Rape, and Kick questionnaire    Fear of Current or Ex-Partner: No    Emotionally Abused: No    Physically Abused: No    Sexually Abused: No    Review of Systems: ROS negative except for what is noted on the assessment and plan.  Objective:   Vitals:   02/03/22 0913  BP: 133/84  Temp: 98.2 F (36.8 C)  TempSrc: Oral  SpO2: 99%  Weight: 210 lb (95.3 kg)  Height: '5\' 2"'$  (1.575 m)    Physical Exam: Constitutional: well-appearing woman sitting in chair, in no acute distress HENT: normocephalic atraumatic, mucous membranes moist Eyes: conjunctiva non-erythematous Neck: supple Cardiovascular: regular rate and rhythm, no m/r/g Pulmonary/Chest: normal work of breathing on room air, lungs clear to auscultation bilaterally Abdominal: soft, non-tender, non-distended MSK: normal bulk and tone Neurological: alert & oriented x 3, moving all extremities, normal gait Skin: warm and dry Psych: Pleasant mood and affect     Assessment & Plan:   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) Dr. Leta Baptist Continues on Rituxan every 24 weeks   Essential hypertension Vitals:   02/03/22 0913  BP: 133/84  BP nearly at goal. No hypertensive symptoms or concerns for hypotension at home. No cramping or difficulty managing diuresis. Discussed salt and sodium intake today.  Continue: Coreg 25 mg BID Verapamil 120 mg daily Spironolactone 25 mg  daily Lasix daily 40 daily  BMP today   Diabetes mellitus ([facility]) Medication: Jardiance 10 mg daily, no GI or yeast infections recently Diet: Patient continues to have one big meal a day. She reports it is balanced with mostly vegetables and baked, lightly pan seared proteins. She does not eat much fruit. Has been snacking a lot since last visit, mostly on Baby Ruths daily (candy bar). She does this because is bored and does not know what else to do with her hands.  Exercise: had been quite active and likes to move. Thinks she is moving at least 150 min per week. Ophtho: patient went to the eye doctor last month at Dr. Zenia Resides office without abnormalities. She continues to see neurology for neuromyelitis optica and received rituximab infusions. UTD. No neuropathy or ulcer concerns today. No catabolic or hypovolemic symptoms.   Last A1c 08/2021: 6.8 Last Cr and GFR:08/2021 Cr 1.05 GFR56 Last Urine microalbumin:05/2021 -<11 HTN and HLD on treatment.  Lab Results  Component Value Date   HGBA1C 7.3 (A) 02/03/2022  A1c increased from prior, suspect this is 2/2 to dietary changes. Discussed healthy alternatives to snacks with patient. Discussed lifestyle modifications with activity to combat boredom. Offered GLP1 agonist medications to help with DM, cravings, and weight management, but patient declined. She does not like needles. After offering po GLP1 medication as well, patient shared preference for life-style modifications in her diet.  -A1c in 3 months -Continue Jardiance 10 mg -Lifestyle modifications  Hyperlipidemia The 10-year ASCVD risk score (Arnett DK, et al., 2019) is: 28.8%   Values used to calculate the score:     Age: 43 years     Sex: Female     Is Non-Hispanic African American: Yes     Diabetic: Yes     Tobacco smoker: No     Systolic Blood Pressure: 240 mmHg     Is BP treated: Yes     HDL Cholesterol: 34 mg/dL     Total Cholesterol: 203 mg/dL  Patient's Atorvastatin was  increased to 80 mg daily at last OV 08/2021 as her LDL106 and she should be treated for secondary prevention in setting of TIA in 2014. She has been compliant with medication since. Reports no new myalgias or side effects. Will repeat lipid panel today to assess therapeutic response vs consideration of additional therapy. -f/u lipid panel today  Vitamin D deficiency Last Vitamin D in 2022 20. Patient has been taking low dose vitamin D daily. Will reassess vitamin D level today and make supplementation changes according to plan.    Patient discussed with Dr. Philipp Ovens

## 2022-02-03 NOTE — Assessment & Plan Note (Addendum)
Vitals:   02/03/22 0913  BP: 133/84  BP nearly at goal. No hypertensive symptoms or concerns for hypotension at home. No cramping or difficulty managing diuresis. Discussed salt and sodium intake today.  Continue: Coreg 25 mg BID Verapamil 120 mg daily Spironolactone 25 mg daily Lasix daily 40 daily  BMP today

## 2022-02-03 NOTE — Patient Instructions (Addendum)
Thank you, [name] for allowing [name] to provide your care today. Today we discussed your diet, good snacks, your diabetes, cholesterol, blood pressure.  Examples:  Nuts Greek yogurt and berries Homemade popcorn without butter Veggies   I have ordered the following labs for you:   Lab Orders         Glucose, capillary         Lipid Profile         BMP8+Anion Gap         Vitamin D (25 hydroxy)         POC Hbg A1C      I will call if any are abnormal. All of your labs can be accessed through "My Chart".  My Chart Access: [email or web address]  Please follow-up in 3 months  Please make sure to arrive 15 minutes prior to your next appointment. If you arrive late, you may be asked to reschedule.    We look forward to seeing you next time. Please call our clinic at [phone] if you have any questions or concerns. The best time to call is Monday-Friday from 9am-4pm, but there is someone available 24/7. If after hours or the weekend, call the main hospital number and ask for the Internal Medicine Resident On-Call. If you need medication refills, please notify your pharmacy one week in advance and they will send [name] a request.   Thank you for letting us take part in your care. Wishing you the best!  Romana Juniper, MD 02/03/2022, 9:57 AM [facility] Internal Medicine Resident, PGY-1

## 2022-02-03 NOTE — Assessment & Plan Note (Signed)
Last Vitamin D in 2022 20. Patient has been taking low dose vitamin D daily. Will reassess vitamin D level today and make supplementation changes according to plan.   Vit D - patient may continue low dose multivitamin

## 2022-02-03 NOTE — Assessment & Plan Note (Signed)
Dr. Leta Baptist Continues on Rituxan every 24 weeks

## 2022-02-03 NOTE — Assessment & Plan Note (Addendum)
The 10-year ASCVD risk score (Arnett DK, et al., 2019) is: 28.8%   Values used to calculate the score:     Age: 72 years     Sex: Female     Is Non-Hispanic African American: Yes     Diabetic: Yes     Tobacco smoker: No     Systolic Blood Pressure: 133 mmHg     Is BP treated: Yes     HDL Cholesterol: 34 mg/dL     Total Cholesterol: 203 mg/dL   Patient's Atorvastatin was increased to 80 mg daily at last OV 08/2021 as her LDL106 and she should be treated for secondary prevention in setting of TIA in 2014. She has been compliant with medication since. Reports no new myalgias or side effects. Will repeat lipid panel today to assess therapeutic response vs consideration of additional therapy. -f/u lipid panel today  LDL still above goal as this is for secondary prevention - upon further discussion, she forgot she needed to take 2 x 40 mg pills/day, and has only recently started taking Atorvastatin 80 mg. Given this, will not start Zetia. Consider reassessing in 4-6 months.

## 2022-02-04 LAB — BMP8+ANION GAP
Anion Gap: 16 mmol/L (ref 10.0–18.0)
BUN/Creatinine Ratio: 13 (ref 12–28)
BUN: 15 mg/dL (ref 8–27)
CO2: 20 mmol/L (ref 20–29)
Calcium: 10 mg/dL (ref 8.7–10.3)
Chloride: 102 mmol/L (ref 96–106)
Creatinine, Ser: 1.12 mg/dL — ABNORMAL HIGH (ref 0.57–1.00)
Glucose: 157 mg/dL — ABNORMAL HIGH (ref 70–99)
Potassium: 4.4 mmol/L (ref 3.5–5.2)
Sodium: 138 mmol/L (ref 134–144)
eGFR: 52 mL/min/{1.73_m2} — ABNORMAL LOW (ref >59)

## 2022-02-04 LAB — LIPID PANEL
Chol/HDL Ratio: 4.4 ratio (ref 0.0–4.4)
Cholesterol, Total: 155 mg/dL (ref 100–199)
HDL: 35 mg/dL — ABNORMAL LOW (ref >39)
LDL Chol Calc (NIH): 88 mg/dL (ref 0–99)
Triglycerides: 187 mg/dL — ABNORMAL HIGH (ref 0–149)
VLDL Cholesterol Cal: 32 mg/dL (ref 5–40)

## 2022-02-04 LAB — VITAMIN D 25 HYDROXY (VIT D DEFICIENCY, FRACTURES): Vit D, 25-Hydroxy: 44.3 ng/mL (ref 30.0–100.0)

## 2022-02-08 ENCOUNTER — Other Ambulatory Visit: Payer: Self-pay | Admitting: Student

## 2022-02-08 DIAGNOSIS — N183 Chronic kidney disease, stage 3 unspecified: Secondary | ICD-10-CM | POA: Insufficient documentation

## 2022-02-08 NOTE — Assessment & Plan Note (Addendum)
Addendum Added CKD to problem list.   No microalbuminuria  Cr and GFR slily decreased from prior but ~patient's baseline. She is on SGLT2i; does not have ARB. Her BP has been at goal thus far. No changes at this time but consider ARB introduction should she need HTN changes for renal protection. -Continue Jardiance

## 2022-02-13 NOTE — Progress Notes (Signed)
Internal Medicine Clinic Attending  Case discussed with Dr. Simeon Craft  At the time of the visit.  We reviewed the resident's history and exam and pertinent patient test results.  I agree with the assessment, diagnosis, and plan of care documented in the resident's note.

## 2022-02-20 ENCOUNTER — Ambulatory Visit: Payer: Medicare HMO | Admitting: Diagnostic Neuroimaging

## 2022-02-22 ENCOUNTER — Encounter: Payer: Medicare HMO | Admitting: Diagnostic Neuroimaging

## 2022-02-22 NOTE — Progress Notes (Signed)
This encounter was created in error - please disregard.

## 2022-03-05 ENCOUNTER — Other Ambulatory Visit: Payer: Self-pay | Admitting: Internal Medicine

## 2022-03-12 ENCOUNTER — Other Ambulatory Visit: Payer: Self-pay | Admitting: Internal Medicine

## 2022-03-14 ENCOUNTER — Encounter: Payer: Self-pay | Admitting: Diagnostic Neuroimaging

## 2022-03-14 ENCOUNTER — Ambulatory Visit (INDEPENDENT_AMBULATORY_CARE_PROVIDER_SITE_OTHER): Payer: Medicare HMO | Admitting: Diagnostic Neuroimaging

## 2022-03-14 VITALS — BP 145/91 | HR 72 | Ht 62.0 in | Wt 209.0 lb

## 2022-03-14 DIAGNOSIS — G360 Neuromyelitis optica [Devic]: Secondary | ICD-10-CM | POA: Diagnosis not present

## 2022-03-14 NOTE — Progress Notes (Signed)
[facility]  PATIENT: Michelle Hurley DOB: 08/07/1950  REFERRING CLINICIAN: Charise Killian, MD  HISTORY FROM: patient and chart REASON FOR VISIT: follow up   HISTORICAL  CHIEF COMPLAINT:  Chief Complaint  Patient presents with   Follow-up    RM 7 alone  Pt is well and stable, no new concerns      HISTORY OF PRESENT ILLNESS:   UPDATE (03/14/22, VRP): Since last visit, doing well. Symptoms are stable. Severity is mild. No alleviating or aggravating factors. Tolerating rituxan.     UPDATE (06/22/21, VRP): Since last visit, doing well. Symptoms are stable. No alleviating or aggravating factors. Tolerating rituximab infusions.Marland Kitchen    UPDATE (12/15/20, VRP): Since last visit, doing well. Symptoms are stable. Pain is improved. No alleviating or aggravating factors. Tolerating rituximab.    UPDATE (10/22/2019, VRP): Since last visit, doing well until 3 to 4 weeks ago when she developed right thoracic and flank pain, numbness and tingling in her right axillary region and right triceps region.  Patient tried muscle relaxers from PCP without relief.  Patient tried over-the-counter topical pain relievers without relief.  Patient has had MRI of the brain, cervical and thoracic spine ordered and scheduled for the next few weeks.  No other triggering factors.  Some tenderness to palpation just below her shoulder blade on the right side.  Vision stable.  Lower extremity stable.  Left arm stable.  UPDATE (05/19/19, VRP): Since last visit, doing well. Had [place] vaccine in March / April 2021; doing well. Last rituxan infusion in Jan 2021. Symptoms are stable. No alleviating or aggravating factors.     UPDATE (11/18/18, VRP): Since last visit, doing well. Symptoms are improved. Right eye vision almost back to normal. No alleviating or aggravating factors. Tolerating rituxan; next dose in Dec 2020.   VIDEO VISIT UPDATE (05/15/18, VRP):  - overall feels well - vision is improved - no  pain - waiting on insurance approval for rituxan  PRIOR HPI (01/30/18): 72 year old female with hypertension, here for evaluation of right retrobulbar, idiopathic optic neuritis.  01/22/2018 patient presented to the hospital after seeing her ophthalmologist for new onset right eye vision loss.  She reported an altitudinal superior visual field defect with some pain.  No problems in the left eye.  Upon admission patient had MRI of the brain and orbits which showed enhancing right optic nerve lesion.  Mild chronic small vessel ischemic disease was also noted as well as scattered chronic cerebral microhemorrhages.  Patient was diagnosed with idiopathic optic neuritis.  She was treated with IV Solu-Medrol.  She had some side effects of hallucinations and irritability.  She completed her course and was discharged home.  Since that time symptoms are continuing to improve.  No problems with arms, legs, balance, bowel or bladder dysfunction.   REVIEW OF SYSTEMS: Full 14 system review of systems performed and negative with exception of: as per HPI.   ALLERGIES: Allergies  Allergen Reactions   Mushroom Extract Complex Anaphylaxis, Itching and Swelling    Swelling all over body    Codeine Nausea And Vomiting   Hydrocodone Nausea And Vomiting   Prednisone Nausea And Vomiting   Omeprazole Itching    HOME MEDICATIONS: Outpatient Medications Prior to Visit  Medication Sig Dispense Refill   acetaminophen (TYLENOL) 500 MG tablet Take 500 mg by mouth every 6 (six) hours as needed.     aspirin EC 81 MG tablet Take 81 mg by mouth daily. Swallow whole.  atorvastatin (LIPITOR) 80 MG tablet Take 1 tablet (80 mg total) by mouth daily. 90 tablet 3   calcium-vitamin D 250-100 MG-UNIT tablet Take 1 tablet by mouth 2 (two) times daily. 30 tablet 3   carvedilol (COREG) 25 MG tablet TAKE 1 TABLET TWICE DAILY WITH MEALS 180 tablet 3   cyclobenzaprine (FLEXERIL) 5 MG tablet TAKE 1 TABLET THREE TIMES DAILY AS NEEDED  FOR MUSCLE SPASM(S) 30 tablet 1   diclofenac Sodium (VOLTAREN) 1 % GEL APPLY 2 GRAMS TOPICALLY 4 (FOUR) TIMES DAILY AS NEEDED. 100 g 2   DULoxetine (CYMBALTA) 60 MG capsule TAKE 1 CAPSULE EVERY DAY 90 capsule 3   furosemide (LASIX) 40 MG tablet TAKE 1 TABLET EVERY DAY 90 tablet 1   gabapentin (NEURONTIN) 300 MG capsule TAKE 1 CAPSULE THREE TIMES DAILY 60 capsule 3   Heating Pads (SUNBEAM STANDARD HEATING PAD) PADS Please use as directed on the instructions included. 1 each 0   JARDIANCE 10 MG TABS tablet TAKE 1 TABLET EVERY DAY 90 tablet 3   levocetirizine (XYZAL) 5 MG tablet TAKE 1 TABLET EVERY EVENING 90 tablet 1   polyethylene glycol powder (GLYCOLAX/MIRALAX) powder Take 17 g by mouth daily as needed for moderate constipation or severe constipation. 255 g 0   RITUXAN 500 MG/50ML injection Infuse '1000mg'$  day 1 & '1000mg'$  day 15, every 6 months 100 mL 1   spironolactone (ALDACTONE) 25 MG tablet TAKE 1 TABLET EVERY DAY 90 tablet 3   verapamil (CALAN) 120 MG tablet TAKE 1/2 TABLET THREE TIMES DAILY 135 tablet 3   No facility-administered medications prior to visit.    PHYSICAL EXAM  GENERAL EXAM/CONSTITUTIONAL: Vitals:  Vitals:   03/14/22 1312  BP: (!) 145/91  Pulse: 72  Weight: 209 lb (94.8 kg)  Height: '5\' 2"'$  (1.575 m)    Body mass index is 38.23 kg/m. Wt Readings from Last 3 Encounters:  03/14/22 209 lb (94.8 kg)  02/22/22 208 lb 6.4 oz (94.5 kg)  02/03/22 210 lb (95.3 kg)   No results found.  Patient is in no distress; well developed, nourished and groomed; neck is supple  CARDIOVASCULAR: Examination of carotid arteries is normal; no carotid bruits Regular rate and rhythm, no murmurs Examination of peripheral vascular system by observation and palpation is normal  EYES: Ophthalmoscopic exam of optic discs and posterior segments is normal; no papilledema or hemorrhages No results found.  MUSCULOSKELETAL: Gait, strength, tone, movements noted in Neurologic exam  below  NEUROLOGIC: MENTAL STATUS:      No data to display         awake, alert, oriented to person, place and time recent and remote memory intact normal attention and concentration language fluent, comprehension intact, naming intact fund of knowledge appropriate  CRANIAL NERVE:  2nd - no papilledema on fundoscopic exam 2nd, 3rd, 4th, 6th - pupils 24m equal and reactive to light, visual fields full to confrontation, extraocular muscles intact, no nystagmus 5th - facial sensation symmetric 7th - facial strength symmetric 8th - hearing intact 9th - palate elevates symmetrically, uvula midline 11th - shoulder shrug symmetric 12th - tongue protrusion midline  MOTOR:  normal bulk and tone, full strength in the BUE, BLE  SENSORY:  normal and symmetric to light touch, temperature, vibration  COORDINATION:  finger-nose-finger, fine finger movements normal  REFLEXES:  deep tendon reflexes present and symmetric  GAIT/STATION:  narrow based gait     DIAGNOSTIC DATA (LABS, IMAGING, TESTING) - I reviewed patient records, labs, notes, testing and imaging  myself where available.  Lab Results  Component Value Date   WBC 4.7 06/22/2021   HGB 14.8 06/22/2021   HCT 45.4 06/22/2021   MCV 81 06/22/2021   PLT 309 06/22/2021      Component Value Date/Time   NA 138 02/03/2022 1010   K 4.4 02/03/2022 1010   CL 102 02/03/2022 1010   CO2 20 02/03/2022 1010   GLUCOSE 157 (H) 02/03/2022 1010   GLUCOSE 155 (H) 01/27/2018 0700   BUN 15 02/03/2022 1010   CREATININE 1.12 (H) 02/03/2022 1010   CREATININE 1.03 (H) 11/19/2014 1131   CALCIUM 10.0 02/03/2022 1010   PROT 6.5 02/09/2021 1526   ALBUMIN 4.1 02/09/2021 1526   AST 15 02/09/2021 1526   ALT 24 02/09/2021 1526   ALKPHOS 68 02/09/2021 1526   BILITOT <0.2 02/09/2021 1526   GFRNONAA 43 (L) 02/25/2020 1005   GFRNONAA 57 (L) 11/19/2014 1131   GFRAA 50 (L) 02/25/2020 1005   GFRAA 65 11/19/2014 1131   Lab Results  Component  Value Date   CHOL 155 02/03/2022   HDL 35 (L) 02/03/2022   LDLCALC 88 02/03/2022   LDLDIRECT 182.8 04/27/2010   TRIG 187 (H) 02/03/2022   CHOLHDL 4.4 02/03/2022   Lab Results  Component Value Date   HGBA1C 7.3 (A) 02/03/2022   Lab Results  Component Value Date   [id number] 01/23/2018   Lab Results  Component Value Date   TSH 0.98 04/27/2010   Lab Results  Component Value Date   ANA Negative 01/23/2018    01/30/18  NMO IgG Autoantibodies 0.0 - 3.0 U/mL 260.6High      11/10/2019 MRI brain with and without 1. No acute intracranial abnormality. 2. No specific evidence of demyelination. Mild scattered T2/FLAIR hyperintensities within the white matter, which are nonspecific but most suggestive of age-appropriate chronic microvascular ischemic disease. No abnormal enhancement. 3. Small prior microhemorrhages in the pons and right thalamus, most likely related to hypertension.   11/10/2019 MRI cervical spine without 1. Patchy T2 signal abnormality involving the bilateral hemi cord at the level of C4-5, most characteristic of chronic myelomalacia. No other signal abnormality within the cervical spinal cord to suggest demyelinating disease. 2. Multilevel cervical spondylosis with resultant mild to moderate diffuse spinal stenosis at C3-4 through C6-7. 3. Multifactorial degenerative changes with resultant moderate to severe C4 through C8 foraminal narrowing as detailed above, most notable at C5 and C6 on the right.  11/10/2019 MRI thoracic without 1. Normal MRI appearance of the thoracic spinal cord. No evidence for demyelinating disease. 2. Mild multilevel degenerative spondylosis with resultant mild flattening of the left ventral cord at T4-5 through T8-9 as above. No significant spinal stenosis or neural impingement.     ASSESSMENT AND PLAN  72 y.o. year old female here with right optic neuritis (right eye; retrobulbar) and positive NMO antibodies.  Dx:  1.  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 spinal cord lesion) - continue rituximab every 6 months (Jan / July) - check CBC, immunoglobulin panel every 6 months (rituximab monitoring)  RIGHT THORACIC PAIN / RIGHT ARM PAIN / NUMBNESS - improved  Orders Placed This Encounter  Procedures   CBC with Differential/Platelet   Immunoglobulins, QN, A/E/G/M   Return in about 8 months (around 11/14/2022).    Penni Bombard, MD [id number], [id number] Certified in Neurology, Neurophysiology and Neuroimaging  [facility] [address] [place], [place]  [address] [phone] 

## 2022-03-18 LAB — IMMUNOGLOBULINS A/E/G/M, SERUM
IgA/Immunoglobulin A, Serum: 204 mg/dL (ref 64–422)
IgE (Immunoglobulin E), Serum: 13 IU/mL (ref 6–495)
IgG (Immunoglobin G), Serum: 916 mg/dL (ref 586–1602)
IgM (Immunoglobulin M), Srm: 30 mg/dL (ref 26–217)

## 2022-03-18 LAB — CBC WITH DIFFERENTIAL/PLATELET
Basophils Absolute: 0 10*3/uL (ref 0.0–0.2)
Basos: 1 %
EOS (ABSOLUTE): 0.1 10*3/uL (ref 0.0–0.4)
Eos: 1 %
Hematocrit: 45.2 % (ref 34.0–46.6)
Hemoglobin: 14.7 g/dL (ref 11.1–15.9)
Immature Grans (Abs): 0 10*3/uL (ref 0.0–0.1)
Immature Granulocytes: 0 %
Lymphocytes Absolute: 1.5 10*3/uL (ref 0.7–3.1)
Lymphs: 31 %
MCH: 26.7 pg (ref 26.6–33.0)
MCHC: 32.5 g/dL (ref 31.5–35.7)
MCV: 82 fL (ref 79–97)
Monocytes Absolute: 0.5 10*3/uL (ref 0.1–0.9)
Monocytes: 11 %
Neutrophils Absolute: 2.7 10*3/uL (ref 1.4–7.0)
Neutrophils: 56 %
Platelets: 279 10*3/uL (ref 150–450)
RBC: 5.5 x10E6/uL — ABNORMAL HIGH (ref 3.77–5.28)
RDW: 14.4 % (ref 11.7–15.4)
WBC: 4.8 10*3/uL (ref 3.4–10.8)

## 2022-04-05 ENCOUNTER — Telehealth: Payer: Self-pay | Admitting: *Deleted

## 2022-04-05 NOTE — Telephone Encounter (Signed)
Returned call to Michelle Hurley regarding the above symptoms. I was informed patient was disrespectful to staff during previous phone calls. She confirmed symptoms as described in RN Ducatte's note. We discussed there are multiple potential etiologies of her symptoms and my recommendation to be evaluated prior to pharmacologic therapy. She is out of town visiting her daughter and is unable to come to clinic. I recommended urgent care evaluation however she reports she is unable to go. When I attempted to explore why she is unable to she does not provide a reason. I again advised presentation to a provider where she is for evaluation, but if not, to call us when she returns for an appointment.

## 2022-04-05 NOTE — Telephone Encounter (Signed)
Patient called in requesting med for yeast infection. States 3 day hx of vaginal itching and burning. Denies discharge. Denies pain burning, itching with urination. Denies urinary urgency or frequency. Offered appt for this PM. States she is out of town. Advised she go to nearest UC to be evaluated.

## 2022-04-12 ENCOUNTER — Telehealth: Payer: Self-pay | Admitting: Internal Medicine

## 2022-04-12 NOTE — Telephone Encounter (Addendum)
Called Michelle Hurley to f/u on symptoms from last week. She notes symptoms of vulvar itching/burning have resolved. She started using Desenex and an eczema cream and symptoms resolved. She is now using corn starch to maintain dryness. We discussed multiple causes of external vulvar itching/burning and need for examination. We will plan for pelvic exam at upcoming visit 4/22, return to clinic sooner if symptoms return. She was appreciative for the call.

## 2022-04-17 DIAGNOSIS — H1013 Acute atopic conjunctivitis, bilateral: Secondary | ICD-10-CM | POA: Diagnosis not present

## 2022-04-17 DIAGNOSIS — E119 Type 2 diabetes mellitus without complications: Secondary | ICD-10-CM | POA: Diagnosis not present

## 2022-04-17 DIAGNOSIS — H469 Unspecified optic neuritis: Secondary | ICD-10-CM | POA: Diagnosis not present

## 2022-04-17 DIAGNOSIS — H43823 Vitreomacular adhesion, bilateral: Secondary | ICD-10-CM | POA: Diagnosis not present

## 2022-04-17 DIAGNOSIS — H25813 Combined forms of age-related cataract, bilateral: Secondary | ICD-10-CM | POA: Diagnosis not present

## 2022-04-17 LAB — HM DIABETES EYE EXAM

## 2022-04-21 ENCOUNTER — Ambulatory Visit
Admission: RE | Admit: 2022-04-21 | Discharge: 2022-04-21 | Disposition: A | Discharge status: Home / Self Care | Payer: Medicare HMO | Source: Ambulatory Visit | Attending: Internal Medicine | Admitting: Internal Medicine

## 2022-04-21 ENCOUNTER — Ambulatory Visit: Admission: RE | Admit: 2022-04-21 | Payer: Medicare HMO | Source: Ambulatory Visit

## 2022-04-21 DIAGNOSIS — R921 Mammographic calcification found on diagnostic imaging of breast: Secondary | ICD-10-CM | POA: Diagnosis not present

## 2022-04-21 DIAGNOSIS — N6310 Unspecified lump in the right breast, unspecified quadrant: Secondary | ICD-10-CM

## 2022-04-21 DIAGNOSIS — N6320 Unspecified lump in the left breast, unspecified quadrant: Secondary | ICD-10-CM

## 2022-04-24 ENCOUNTER — Encounter: Payer: Self-pay | Admitting: Dietician

## 2022-04-24 ENCOUNTER — Other Ambulatory Visit: Payer: Self-pay | Admitting: Internal Medicine

## 2022-04-24 DIAGNOSIS — M549 Dorsalgia, unspecified: Secondary | ICD-10-CM

## 2022-04-24 MED ORDER — DULOXETINE HCL 60 MG PO CPEP: 60.0000 mg | ORAL_CAPSULE | Freq: Every day | ORAL | 3 refills | Status: DC

## 2022-05-01 ENCOUNTER — Other Ambulatory Visit: Payer: Self-pay

## 2022-05-01 ENCOUNTER — Ambulatory Visit (INDEPENDENT_AMBULATORY_CARE_PROVIDER_SITE_OTHER): Payer: Medicare HMO | Admitting: Internal Medicine

## 2022-05-01 ENCOUNTER — Encounter: Payer: Self-pay | Admitting: Internal Medicine

## 2022-05-01 VITALS — BP 141/75 | HR 62 | Temp 97.9°F | Ht 62.0 in | Wt 211.0 lb

## 2022-05-01 DIAGNOSIS — N1831 Chronic kidney disease, stage 3a: Secondary | ICD-10-CM

## 2022-05-01 DIAGNOSIS — Z7984 Long term (current) use of oral hypoglycemic drugs: Secondary | ICD-10-CM

## 2022-05-01 DIAGNOSIS — E1122 Type 2 diabetes mellitus with diabetic chronic kidney disease: Secondary | ICD-10-CM | POA: Diagnosis not present

## 2022-05-01 DIAGNOSIS — E559 Vitamin D deficiency, unspecified: Secondary | ICD-10-CM | POA: Diagnosis not present

## 2022-05-01 DIAGNOSIS — I4729 Other ventricular tachycardia: Secondary | ICD-10-CM

## 2022-05-01 DIAGNOSIS — I700 Atherosclerosis of aorta: Secondary | ICD-10-CM

## 2022-05-01 DIAGNOSIS — I129 Hypertensive chronic kidney disease with stage 1 through stage 4 chronic kidney disease, or unspecified chronic kidney disease: Secondary | ICD-10-CM

## 2022-05-01 DIAGNOSIS — M5431 Sciatica, right side: Secondary | ICD-10-CM

## 2022-05-01 DIAGNOSIS — B3731 Acute candidiasis of vulva and vagina: Secondary | ICD-10-CM

## 2022-05-01 DIAGNOSIS — I10 Essential (primary) hypertension: Secondary | ICD-10-CM | POA: Diagnosis not present

## 2022-05-01 DIAGNOSIS — E785 Hyperlipidemia, unspecified: Secondary | ICD-10-CM

## 2022-05-01 DIAGNOSIS — L292 Pruritus vulvae: Secondary | ICD-10-CM

## 2022-05-01 DIAGNOSIS — Z8673 Personal history of transient ischemic attack (TIA), and cerebral infarction without residual deficits: Secondary | ICD-10-CM

## 2022-05-01 DIAGNOSIS — M5432 Sciatica, left side: Secondary | ICD-10-CM

## 2022-05-01 DIAGNOSIS — Z6838 Body mass index (BMI) 38.0-38.9, adult: Secondary | ICD-10-CM

## 2022-05-01 DIAGNOSIS — R928 Other abnormal and inconclusive findings on diagnostic imaging of breast: Secondary | ICD-10-CM | POA: Diagnosis not present

## 2022-05-01 HISTORY — DX: Acute candidiasis of vulva and vagina: B37.31

## 2022-05-01 LAB — POCT GLYCOSYLATED HEMOGLOBIN (HGB A1C): Hemoglobin A1C: 6.9 % — AB (ref 4.0–5.6)

## 2022-05-01 LAB — GLUCOSE, CAPILLARY: Glucose-Capillary: 155 mg/dL — ABNORMAL HIGH (ref 70–99)

## 2022-05-01 MED ORDER — GABAPENTIN 300 MG PO CAPS: 300.0000 mg | ORAL_CAPSULE | Freq: Three times a day (TID) | ORAL | 0 refills | Status: DC | PRN

## 2022-05-01 MED ORDER — LOSARTAN POTASSIUM 25 MG PO TABS: 25.0000 mg | ORAL_TABLET | Freq: Every day | ORAL | 3 refills | Status: DC

## 2022-05-01 NOTE — Assessment & Plan Note (Signed)
History of bilateral sciatica symptoms, intermittent. She has been using gabapentin 300 mg capsules as needed for pain symptoms. She notes sometimes being able to go months without using the medication. She is no longer taking duloxetine. No symptoms today. She previously found benefit from exercise therapy and we have discussed getting back to this. I have changed her gabapentin prescription to reflect prn use and no refills to use up her current stock as she has a large supply at home now. She will contact the clinic when she needs a refill. She was advised to not drive after taking this medication as she notes it makes her drowsy to which she agreed.  Plan -Continue gabapentin 300 mg tid prn

## 2022-05-01 NOTE — Assessment & Plan Note (Addendum)
Blood pressure above goal <130/80 today and at recent visits. Antihypertensives currently include carvedilol, verapamil, furosemide, and spironolactone, she appears to have been on these for years. Given coexisting CKD, ARB would be beneficial and will add low dose losartan. Recheck BMP today for potassium. Will return for RN BP visit and lab recheck in 4 weeks, return with me in 3 months.  BP Readings from Last 3 Encounters:  05/01/22 (!) 141/75  03/14/22 (!) 145/91  02/22/22 135/89   Plan -Add losartan 25 mg daily -BMP today -Continue carvedilol 25 mg bid, spironolactone 25 mg daily, verapamil 60 tid, furosemide 40 mg daily -RN BP visit and BMP in 4 weeks -Return to see me in 3 months

## 2022-05-01 NOTE — Assessment & Plan Note (Signed)
Bilateral diagnostic mammogram 04/21/22:  "IMPRESSION: 1. Stable, to improved appearance of probably benign calcifications within the left breast. Recommend continued short-term follow-up. 2. Stable, probably benign right breast masses, likely corresponding with fibrocystic changes. Recommend continued short-term follow-up.   RECOMMENDATION: Bilateral diagnostic mammogram in 6 months. .... BI-RADS CATEGORY  3: Probably benign."  Plan -Diagnostic mammogram 10/2022

## 2022-05-01 NOTE — Assessment & Plan Note (Signed)
Recent episode of vulvar itching, see telephone note 04/12/22. Asymptomatic today. Pelvic exam with normal appearing vulva, no lesions, plaques, or significant atrophy. No abnormal discharge. Michelle Hurley will call with recurrent symptoms. We discussed that empagliflozin can increase risk of genital fungal infection and would like to reassess if symptoms return.

## 2022-05-01 NOTE — Patient Instructions (Addendum)
It was wonderful to see you today Michelle Hurley!  We have started losartan 25 mg daily for blood pressure. I'll have you come back in one month for a lab check and nurse visit to check blood pressure.  You are doing great with nutrition. Keep it up. Try to get back to the [employer] at least one day each week to increase your physical activity.  Please contact your pharmacy about scheduling the shingles (Shingrix) and updated COVID-19 vaccines.

## 2022-05-01 NOTE — Assessment & Plan Note (Signed)
Creatinine/eGFR over the last several years consistent with CKD3a. No history of microalbuminuria. Likely secondary to longstanding hypertension, now diabetes. Currently on SGLT2i for diabetes, ARB added today for hypertension.  Plan -BMP, uACR -ARB/SGLT2i

## 2022-05-01 NOTE — Assessment & Plan Note (Signed)
Remains on BB/CCB for history of ventricular tachycardia per prior EP evaluation. Denies palpitations, chest pain, SOB, lightheadedness, or syncope. Continue.

## 2022-05-01 NOTE — Assessment & Plan Note (Signed)
History of suspected TIA in 2014. Remains on aspirin 81 mg and statin daily. Continue therapy.

## 2022-05-01 NOTE — Assessment & Plan Note (Signed)
BMI 38.59, stable over the last several years. Complicated by hypertension, diabetes, kidney disease, hyperlipidemia, and history of TIA. Weight loss through dietary changes and increased exercise was recommended and strategies discussed.

## 2022-05-01 NOTE — Assessment & Plan Note (Signed)
Appropriate at last check. Continue current supplementation.

## 2022-05-01 NOTE — Assessment & Plan Note (Signed)
Secondary prevention given history of TIA in 2014. Started taking atorvastatin 80 mg daily just prior to last visit in January 2024. Repeat today, LDL goal <70.  Plan -lipid panel

## 2022-05-01 NOTE — Assessment & Plan Note (Signed)
A1c improved to 6.9%. Remains on empagliflozin 10 mg daily. She continues to make excellent nutrition choices including lean protein, vegetables, legumes, and whole grains such as brown rice. She has cut down on candy from last visit. She does drink sweet tea and we have discussed limiting this today. We discussed meeting with an RD for further tailoring of nutrition, she declines today. She is very active with her grandchildren and children, however no formal exercise. She used to go to the [facility] through the [employer] but has not been recently. She is interested in returning and has set the goal of going at least once/week.  Plan -Continue empagliflozin 10 mg daily -BMP, uACR today -Continue statin -Nutrition and exercise counseling provided -UTD eye and foot exam

## 2022-05-01 NOTE — Assessment & Plan Note (Signed)
Aortoiliac atherosclerosis on CT abdomen in 2016. Remains on aspirin and statin therapy for secondary prevention following TIA. Recheck lipid panel today.  Plan -Lipid panel, continue atorvastatin 80 mg daily

## 2022-05-01 NOTE — Progress Notes (Signed)
Established Patient Office Visit  Subjective   [name], female    DOB: 04-11-1950  Age: 72 y.o. MRN: [id number]  Chief Complaint  Patient presents with   Check-up Visit    Michelle Hurley returns to clinic today for follow-up of chronic medical conditions and first visit with me. Please see assessment/plan in problem-based charting for further details of today's visit.    Patient Active Problem List   Diagnosis Date Noted   Vulvar itching 05/01/2022   CKD (chronic kidney disease), stage III 02/08/2022   Nocturnal cough 05/11/2021   Diabetes mellitus 02/10/2021   Bilateral hand pain 11/23/2020   Bilateral sciatica 07/20/2020   Fatigue 02/25/2020   Upper back pain on right side 09/25/2019   Abnormality of both breasts on screening mammogram 04/11/2019   Pruritic rash/Eczema 01/29/2019   GERD (gastroesophageal reflux disease) 08/07/2018   Vitamin D deficiency 02/12/2018   Neuromyelitis optica 01/22/2018   Estrogen deficiency 11/16/2016   Atherosclerosis of abdominal aorta 10/01/2015   Healthcare maintenance 04/16/2015   Allergic rhinitis 04/16/2015   Hx-TIA (transient ischemic attack) 04/06/2012   VENTRICULAR TACHYCARDIA 12/04/2008   Hyperlipidemia 12/24/2006   OBESITY, MORBID 12/24/2006   Essential hypertension 12/24/2006   Review of Systems  Respiratory:  Negative for shortness of breath.   Cardiovascular:  Negative for chest pain and palpitations.  Neurological:  Negative for dizziness and loss of consciousness.      Objective:     BP (!) 141/75 (BP Location: Right Arm, Patient Position: Sitting, Cuff Size: Normal)   Pulse 62   Temp 97.9 F (36.6 C) (Oral)   Ht 5\' 2"  (1.575 m)   Wt 211 lb (95.7 kg)   LMP 04/05/2012   SpO2 99% Comment: RA  BMI 38.59 kg/m  BP Readings from Last 3 Encounters:  05/01/22 (!) 141/75  03/14/22 (!) 145/91  02/22/22 135/89   Wt Readings from Last 3 Encounters:  05/01/22 211 lb (95.7 kg)  03/14/22 209 lb  (94.8 kg)  02/22/22 208 lb 6.4 oz (94.5 kg)      Physical Exam Vitals reviewed.  Constitutional:      General: She is not in acute distress.    Appearance: Normal appearance.  Cardiovascular:     Rate and Rhythm: Normal rate and regular rhythm.     Heart sounds: Normal heart sounds.  Pulmonary:     Effort: Pulmonary effort is normal.     Breath sounds: Normal breath sounds.  Genitourinary:    General: Normal vulva.     Vagina: No vaginal discharge.  Skin:    General: Skin is warm and dry.  Neurological:     General: No focal deficit present.     Mental Status: She is alert.  Psychiatric:        Mood and Affect: Mood normal.        Behavior: Behavior normal.        Assessment & Plan:   Problem List Items Addressed This Visit       Cardiovascular and Mediastinum   Essential hypertension (Chronic)    Blood pressure above goal <130/80 today and at recent visits. Antihypertensives currently include carvedilol, verapamil, furosemide, and spironolactone, she appears to have been on these for years. Given coexisting CKD, ARB would be beneficial and will add low dose losartan. Recheck BMP today for potassium. Will return for RN BP visit and lab recheck in 4 weeks, return with me in 3 months.  BP Readings from Last 3 Encounters:  05/01/22 (!) 141/75  03/14/22 (!) 145/91  02/22/22 135/89  Plan -Add losartan 25 mg daily -BMP today -Continue carvedilol 25 mg bid, spironolactone 25 mg daily, verapamil 60 tid, furosemide 40 mg daily -RN BP visit and BMP in 4 weeks -Return to see me in 3 months       Relevant Medications   losartan (COZAAR) 25 MG tablet   Other Relevant Orders   BMP8+Anion Gap   BMP8+Anion Gap   VENTRICULAR TACHYCARDIA    Remains on BB/CCB for history of ventricular tachycardia per prior EP evaluation. Denies palpitations, chest pain, SOB, lightheadedness, or syncope. Continue.      Relevant Medications   losartan (COZAAR) 25 MG tablet    Atherosclerosis of abdominal aorta    Aortoiliac atherosclerosis on CT abdomen in 2016. Remains on aspirin and statin therapy for secondary prevention following TIA. Recheck lipid panel today.  Plan -Lipid panel, continue atorvastatin 80 mg daily      Relevant Medications   losartan (COZAAR) 25 MG tablet     Endocrine   Diabetes mellitus - Primary (Chronic)    A1c improved to 6.9%. Remains on empagliflozin 10 mg daily. She continues to make excellent nutrition choices including lean protein, vegetables, legumes, and whole grains such as brown rice. She has cut down on candy from last visit. She does drink sweet tea and we have discussed limiting this today. We discussed meeting with an RD for further tailoring of nutrition, she declines today. She is very active with her grandchildren and children, however no formal exercise. She used to go to the [facility] through the [employer] but has not been recently. She is interested in returning and has set the goal of going at least once/week.  Plan -Continue empagliflozin 10 mg daily -BMP, uACR today -Continue statin -Nutrition and exercise counseling provided -UTD eye and foot exam      Relevant Medications   losartan (COZAAR) 25 MG tablet   Other Relevant Orders   Lipid Profile   POC Hbg A1C (Completed)   Microalbumin / Creatinine Urine Ratio   Glucose, capillary (Completed)     Nervous and Auditory   Bilateral sciatica    History of bilateral sciatica symptoms, intermittent. She has been using gabapentin 300 mg capsules as needed for pain symptoms. She notes sometimes being able to go months without using the medication. She is no longer taking duloxetine. No symptoms today. She previously found benefit from exercise therapy and we have discussed getting back to this. I have changed her gabapentin prescription to reflect prn use and no refills to use up her current stock as she has a large supply at home now. She will contact the  clinic when she needs a refill. She was advised to not drive after taking this medication as she notes it makes her drowsy to which she agreed.  Plan -Continue gabapentin 300 mg tid prn      Relevant Medications   gabapentin (NEURONTIN) 300 MG capsule     Genitourinary   CKD (chronic kidney disease), stage III    Creatinine/eGFR over the last several years consistent with MVH8I. No history of microalbuminuria. Likely secondary to longstanding hypertension, now diabetes. Currently on SGLT2i for diabetes, ARB added today for hypertension.  Plan -BMP, uACR -ARB/SGLT2i      Relevant Orders   BMP8+Anion Gap   BMP8+Anion Gap   Vulvar itching    Recent episode of vulvar itching, see telephone note 04/12/22. Asymptomatic today. Pelvic exam with  normal appearing vulva, no lesions, plaques, or significant atrophy. No abnormal discharge. Michelle Hurley will call with recurrent symptoms. We discussed that empagliflozin can increase risk of genital fungal infection and would like to reassess if symptoms return.        Other   Hyperlipidemia (Chronic)    Secondary prevention given history of TIA in 2014. Started taking atorvastatin 80 mg daily just prior to last visit in January 2024. Repeat today, LDL goal <70.  Plan -lipid panel      Relevant Medications   losartan (COZAAR) 25 MG tablet   Hx-TIA (transient ischemic attack) (Chronic)    History of suspected TIA in 2014. Remains on aspirin 81 mg and statin daily. Continue therapy.      OBESITY, MORBID    BMI 38.59, stable over the last several years. Complicated by hypertension, diabetes, kidney disease, hyperlipidemia, and history of TIA. Weight loss through dietary changes and increased exercise was recommended and strategies discussed.      Vitamin D deficiency    Appropriate at last check. Continue current supplementation.      Abnormality of both breasts on screening mammogram    Bilateral diagnostic mammogram  04/21/22:  "IMPRESSION: 1. Stable, to improved appearance of probably benign calcifications within the left breast. Recommend continued short-term follow-up. 2. Stable, probably benign right breast masses, likely corresponding with fibrocystic changes. Recommend continued short-term follow-up.   RECOMMENDATION: Bilateral diagnostic mammogram in 6 months. .... BI-RADS CATEGORY  3: Probably benign."  Plan -Diagnostic mammogram 10/2022      Return in 4 weeks for BP RN visit and lab check.  Return in about 3 months (around 07/31/2022) for fu hypertension/DM.    Dickie La, MD

## 2022-05-02 LAB — LIPID PANEL
Chol/HDL Ratio: 4.1 ratio (ref 0.0–4.4)
Cholesterol, Total: 139 mg/dL (ref 100–199)
HDL: 34 mg/dL — ABNORMAL LOW (ref >39)
LDL Chol Calc (NIH): 85 mg/dL (ref 0–99)
Triglycerides: 111 mg/dL (ref 0–149)
VLDL Cholesterol Cal: 20 mg/dL (ref 5–40)

## 2022-05-02 LAB — BMP8+ANION GAP
Anion Gap: 18 mmol/L (ref 10.0–18.0)
BUN/Creatinine Ratio: 15 (ref 12–28)
BUN: 15 mg/dL (ref 8–27)
CO2: 18 mmol/L — ABNORMAL LOW (ref 20–29)
Calcium: 10.2 mg/dL (ref 8.7–10.3)
Chloride: 104 mmol/L (ref 96–106)
Creatinine, Ser: 0.98 mg/dL (ref 0.57–1.00)
Glucose: 109 mg/dL — ABNORMAL HIGH (ref 70–99)
Potassium: 4.1 mmol/L (ref 3.5–5.2)
Sodium: 140 mmol/L (ref 134–144)
eGFR: 61 mL/min/{1.73_m2} (ref >59)

## 2022-05-02 LAB — MICROALBUMIN / CREATININE URINE RATIO
Creatinine, Urine: 47.7 mg/dL
Microalb/Creat Ratio: <6 mg/g creat (ref 0–29)
Microalbumin, Urine: <3 ug/mL

## 2022-05-03 ENCOUNTER — Other Ambulatory Visit: Payer: Self-pay | Admitting: Internal Medicine

## 2022-05-03 ENCOUNTER — Telehealth: Payer: Self-pay | Admitting: Internal Medicine

## 2022-05-03 DIAGNOSIS — R921 Mammographic calcification found on diagnostic imaging of breast: Secondary | ICD-10-CM

## 2022-05-03 NOTE — Telephone Encounter (Signed)
Unable to reach Michelle Hurley to discuss lab results. Will attempt again.

## 2022-05-04 MED ORDER — EZETIMIBE 10 MG PO TABS: 10.0000 mg | ORAL_TABLET | Freq: Every day | ORAL | 3 refills | Status: DC

## 2022-05-04 NOTE — Progress Notes (Signed)
Kidney function stable to slightly improved. No proteinuria.

## 2022-05-04 NOTE — Progress Notes (Signed)
LDL remains above goal <70 for secondary prevention on atorvastatin 80 mg daily. Will add ezetimibe, discussed with Ms. Manter.

## 2022-05-04 NOTE — Addendum Note (Signed)
Addended by: Dickie La on: 05/04/2022 04:40 PM   Modules accepted: Orders

## 2022-05-29 ENCOUNTER — Encounter: Payer: Medicare HMO | Admitting: Student

## 2022-05-29 ENCOUNTER — Ambulatory Visit: Payer: Medicare HMO

## 2022-06-06 ENCOUNTER — Ambulatory Visit (INDEPENDENT_AMBULATORY_CARE_PROVIDER_SITE_OTHER): Payer: Medicare HMO | Admitting: Student

## 2022-06-06 ENCOUNTER — Telehealth: Payer: Self-pay | Admitting: *Deleted

## 2022-06-06 ENCOUNTER — Encounter: Payer: Self-pay | Admitting: Student

## 2022-06-06 VITALS — BP 135/79 | HR 85 | Temp 98.1°F | Wt 210.4 lb

## 2022-06-06 DIAGNOSIS — I10 Essential (primary) hypertension: Secondary | ICD-10-CM

## 2022-06-06 DIAGNOSIS — R052 Subacute cough: Secondary | ICD-10-CM | POA: Diagnosis not present

## 2022-06-06 DIAGNOSIS — T148XXA Other injury of unspecified body region, initial encounter: Secondary | ICD-10-CM

## 2022-06-06 DIAGNOSIS — M4302 Spondylolysis, cervical region: Secondary | ICD-10-CM | POA: Diagnosis not present

## 2022-06-06 DIAGNOSIS — M5431 Sciatica, right side: Secondary | ICD-10-CM | POA: Diagnosis not present

## 2022-06-06 DIAGNOSIS — M5432 Sciatica, left side: Secondary | ICD-10-CM

## 2022-06-06 DIAGNOSIS — L292 Pruritus vulvae: Secondary | ICD-10-CM | POA: Diagnosis not present

## 2022-06-06 DIAGNOSIS — R202 Paresthesia of skin: Secondary | ICD-10-CM

## 2022-06-06 MED ORDER — DESITIN RAPID RELIEF 13 % EX CREA: 1.0000 "application " | TOPICAL_CREAM | Freq: Every day | CUTANEOUS | 0 refills | Status: AC

## 2022-06-06 NOTE — Telephone Encounter (Signed)
Call from pt requesting to be seen today. C/o left arm pain;which has happened  on Sat, Sun and Monday. No c/o pain at this time.Pt stated the pain goes from her shoulder to the wrist. Also stated her arm is very weak. Denies shortness breath;stated it does not radiate to her chest , neck ,back. She took Gabapentin which helped . No available appts until this afternoon; appt scheduled w/Dr Elaina Pattee @ 1545 PM. Also sending to her PCP for further advise. Thanks

## 2022-06-06 NOTE — Patient Instructions (Addendum)
It was a pleasure seeing you in clinic today.   Left arm pain Please continue gabapentin for your arm pain  Rash on buttock Please use a zinc based barrier cream such as desitin on the area of pain and itching, looks like the there is a small rash there and the pain should improve once it heals   I will call you with your lab results   Follow up in 2 months for diabetes follow up

## 2022-06-07 ENCOUNTER — Other Ambulatory Visit: Payer: Self-pay | Admitting: Diagnostic Neuroimaging

## 2022-06-07 ENCOUNTER — Telehealth: Payer: Self-pay | Admitting: Diagnostic Neuroimaging

## 2022-06-07 ENCOUNTER — Encounter: Payer: Self-pay | Admitting: Diagnostic Neuroimaging

## 2022-06-07 DIAGNOSIS — R200 Anesthesia of skin: Secondary | ICD-10-CM

## 2022-06-07 DIAGNOSIS — G360 Neuromyelitis optica [Devic]: Secondary | ICD-10-CM

## 2022-06-07 NOTE — Progress Notes (Signed)
Left arm, shoulder numbness and tingling noted at PCP visit yesterday. Will check additional testing.   Orders Placed This Encounter  Procedures   MR CERVICAL SPINE W WO CONTRAST   NCV with EMG(electromyography)   Suanne Marker, MD 06/07/2022, 5:54 PM Certified in Neurology, Neurophysiology and Neuroimaging  [facility] [address], [address] [place], [place] [address] [phone]

## 2022-06-07 NOTE — Telephone Encounter (Signed)
Sent mychart msg and letter in mail informing pt of appt change due to provider schedule change 

## 2022-06-08 ENCOUNTER — Telehealth: Payer: Self-pay | Admitting: Diagnostic Neuroimaging

## 2022-06-08 DIAGNOSIS — R059 Cough, unspecified: Secondary | ICD-10-CM

## 2022-06-08 DIAGNOSIS — R202 Paresthesia of skin: Secondary | ICD-10-CM | POA: Insufficient documentation

## 2022-06-08 DIAGNOSIS — T148XXA Other injury of unspecified body region, initial encounter: Secondary | ICD-10-CM | POA: Insufficient documentation

## 2022-06-08 HISTORY — DX: Cough, unspecified: R05.9

## 2022-06-08 LAB — BMP8+ANION GAP
Anion Gap: 16 mmol/L (ref 10.0–18.0)
BUN/Creatinine Ratio: 18 (ref 12–28)
BUN: 19 mg/dL (ref 8–27)
CO2: 21 mmol/L (ref 20–29)
Calcium: 9.4 mg/dL (ref 8.7–10.3)
Chloride: 102 mmol/L (ref 96–106)
Creatinine, Ser: 1.08 mg/dL — ABNORMAL HIGH (ref 0.57–1.00)
Glucose: 85 mg/dL (ref 70–99)
Potassium: 4.2 mmol/L (ref 3.5–5.2)
Sodium: 139 mmol/L (ref 134–144)
eGFR: 54 mL/min/{1.73_m2} — ABNORMAL LOW (ref >59)

## 2022-06-08 NOTE — Assessment & Plan Note (Addendum)
Reports intermittent feeling of dry throat causing her to cough.  States this seems to be related to dehydration and improves with drinking water.  Denies any symptoms of GERD aside from cough.  She is on Lasix and Jardiance which may be causing her to be mildly dehydrated.  Encouraged her to drink plenty of water and follow-up if cough or sore throat becomes more bothersome.

## 2022-06-08 NOTE — Assessment & Plan Note (Signed)
Patient reports pain and swelling of the left gluteal cleft.  Reports similar areas around the vulva with associated itching in the past.  No valvular lesions today.  On exam she has a small abrasion at the left gluteal cleft with mild induration.  Small skin tag near the rectum which is asymptomatic.  No drainage or fluctuance.  Patient denies any trauma or scratching.  Recommended barrier cream with Desitin and follow-up if she continues to have similar lesions.

## 2022-06-08 NOTE — Progress Notes (Signed)
Established Patient Office Visit  Subjective   [name], female    DOB: January 08, 1951  Age: 72 y.o. MRN: [id number]  Chief Complaint  Patient presents with   Arm Pain    Left arm pain off and on since Saturday Eased up with Gabapentin    Recurrent Skin Infections    "Boil" (buttocks)      Michelle Hurley is a 72 y.o. person living with a history listed below who presents to clinic for acute left arm pain for 3 days. Please refer to problem based charting for further details and assessment and plan of current problem and chronic medical conditions.     Patient Active Problem List   Diagnosis Date Noted   Abrasion of the left buttock 06/08/2022   Arm paresthesia, left 06/08/2022   Cough 06/08/2022   Vulvar itching 05/01/2022   CKD (chronic kidney disease), stage III (HCC) 02/08/2022   Nocturnal cough 05/11/2021   Diabetes mellitus (HCC) 02/10/2021   Bilateral hand pain 11/23/2020   Bilateral sciatica 07/20/2020   Fatigue 02/25/2020   Upper back pain on right side 09/25/2019   Abnormality of both breasts on screening mammogram 04/11/2019   Pruritic rash/Eczema 01/29/2019   GERD (gastroesophageal reflux disease) 08/07/2018   Vitamin D deficiency 02/12/2018   Neuromyelitis optica (HCC) 01/22/2018   Estrogen deficiency 11/16/2016   Atherosclerosis of abdominal aorta (HCC) 10/01/2015   Healthcare maintenance 04/16/2015   Allergic rhinitis 04/16/2015   Hx-TIA (transient ischemic attack) 04/06/2012   VENTRICULAR TACHYCARDIA 12/04/2008   Hyperlipidemia 12/24/2006   OBESITY, MORBID 12/24/2006   Essential hypertension 12/24/2006   ROS: negative as per HPI     Objective:     BP 135/79 (BP Location: Right Arm, Patient Position: Sitting, Cuff Size: Normal)   Pulse 85   Temp 98.1 F (36.7 C) (Oral)   Wt 210 lb 6.4 oz (95.4 kg)   LMP 04/05/2012   SpO2 100%   BMI 38.48 kg/m  BP Readings from Last 3 Encounters:  06/06/22 135/79  05/01/22 (!) 141/75   03/14/22 (!) 145/91      Physical Exam Exam conducted with a chaperone present.  Constitutional:      Appearance: Normal appearance.  HENT:     Mouth/Throat:     Mouth: Mucous membranes are moist.     Pharynx: Oropharynx is clear.  Cardiovascular:     Rate and Rhythm: Normal rate and regular rhythm.  Pulmonary:     Effort: Pulmonary effort is normal.     Breath sounds: No rhonchi or rales.  Abdominal:     General: Abdomen is flat. Bowel sounds are normal. There is no distension.     Palpations: Abdomen is soft.     Tenderness: There is no abdominal tenderness.  Genitourinary:    Exam position: Knee-chest position.     Labia:        Right: No rash or lesion.        Left: No rash.      Vagina: No erythema.     Rectum: External hemorrhoid present.     Comments: 1x1 cm abrasion of left gluteal cleft with mild underlying induration, no drainage or fluctuance. Skin tag near rectum  Musculoskeletal:        General: Normal range of motion.     Right lower leg: No edema.     Left lower leg: No edema.  Skin:    General: Skin is warm and dry.     Capillary Refill:  Capillary refill takes less than 2 seconds.  Neurological:     General: No focal deficit present.     Mental Status: She is alert and oriented to person, place, and time.     Cranial Nerves: No cranial nerve deficit.     Sensory: No sensory deficit.     Motor: No weakness.     Coordination: Coordination normal.     Comments: No facial droop, normal speech, normal sensation of the left arm with symmetric strength of the upper extremities   Psychiatric:        Mood and Affect: Mood normal.        Behavior: Behavior normal.      Results for orders placed or performed in visit on 06/06/22  BMP8+Anion Gap  Result Value Ref Range   Glucose 85 70 - 99 mg/dL   BUN 19 8 - 27 mg/dL   Creatinine, Ser 1.61 (H) 0.57 - 1.00 mg/dL   eGFR 54 (L) >09 UE/AVW/0.98   BUN/Creatinine Ratio 18 12 - 28   Sodium 139 134 - 144  mmol/L   Potassium 4.2 3.5 - 5.2 mmol/L   Chloride 102 96 - 106 mmol/L   CO2 21 20 - 29 mmol/L   Anion Gap 16.0 10.0 - 18.0 mmol/L   Calcium 9.4 8.7 - 10.3 mg/dL    Last metabolic panel Lab Results  Component Value Date   GLUCOSE 85 06/06/2022   NA 139 06/06/2022   K 4.2 06/06/2022   CL 102 06/06/2022   CO2 21 06/06/2022   BUN 19 06/06/2022   CREATININE 1.08 (H) 06/06/2022   EGFR 54 (L) 06/06/2022   CALCIUM 9.4 06/06/2022   PROT 6.5 02/09/2021   ALBUMIN 4.1 02/09/2021   LABGLOB 2.4 02/09/2021   AGRATIO 1.7 02/09/2021   BILITOT <0.2 02/09/2021   ALKPHOS 68 02/09/2021   AST 15 02/09/2021   ALT 24 02/09/2021   ANIONGAP 11 01/27/2018      The 10-year ASCVD risk score (Arnett DK, et al., 2019) is: 21.6%    Assessment & Plan:   Problem List Items Addressed This Visit     Essential hypertension - Primary (Chronic)    BP improved to 135/79 with addition of losartan 25 mg daily. BMP today.      Relevant Orders   BMP8+Anion Gap (Completed)   Bilateral sciatica   Abrasion of the left buttock    Patient reports pain and swelling of the left gluteal cleft.  Reports similar areas around the vulva with associated itching in the past.  No valvular lesions today.  On exam she has a small abrasion at the left gluteal cleft with mild induration.  Small skin tag near the rectum which is asymptomatic.  No drainage or fluctuance.  Patient denies any trauma or scratching.  Recommended barrier cream with Desitin and follow-up if she continues to have similar lesions.      Arm paresthesia, left    Patient reports paresthesias of the left shoulder radiating down to her left wrist for the past 3 days.  Started as she was preparing for a cookout for family over the weekend.  Describes the pain as pins-and-needles similar to sciatica.  Was unable to move her arm much due to the pain for about 30 minutes 2 days ago.  She took a gabapentin and the pain resolved and she had no difficulty with  strength or sensation in the arm.  No pain from the neck or in her hand.  Denies  any trauma or changes in physical activity.  Feels gabapentin is helping with pain. Does feel like the pain is slowly improving.  Normal neurologic exam in office today.  Spurling maneuver negative.  Pain may be related to her cervical stenosis although she does not have any neck pain.  Patient does have history of neuromyelitis optica with T2 signal abnormality at the C4-C5 level consistent with chronic myelomalacia on MRI in 2021. Unclear if this may be related to her acute pain. Will discuss with her neurologist.   Continue gabapentin as needed for pain PT order for cervical stenosis Discussed with her neurologist who is arranging repeat MRI and EMG to further evaluate      Cough    Reports intermittent feeling of dry throat causing her to cough.  States this seems to be related to dehydration and improves with drinking water.  Denies any symptoms of GERD aside from cough.  She is on Lasix and Jardiance which may be causing her to be mildly dehydrated.  Encouraged her to drink plenty of water and follow-up if cough or sore throat becomes more bothersome.      Vulvar itching    Continues to have intermittent pruritus of the vulva especially after urinating.  No rashes or itching today.  We discussed that [place] Pepper may be contributing to her symptoms but she would like to hold off on making medication changes today.      Other Visit Diagnoses     Cervical spondylolysis       Relevant Orders   Ambulatory referral to Physical Therapy       Return in about 2 months (around 08/06/2022).    Quincy Simmonds, MD

## 2022-06-08 NOTE — Assessment & Plan Note (Addendum)
Patient reports paresthesias of the left shoulder radiating down to her left wrist for the past 3 days.  Started as she was preparing for a cookout for family over the weekend.  Describes the pain as pins-and-needles similar to sciatica.  Was unable to move her arm much due to the pain for about 30 minutes 2 days ago.  She took a gabapentin and the pain resolved and she had no difficulty with strength or sensation in the arm.  No pain from the neck or in her hand.  Denies any trauma or changes in physical activity.  Feels gabapentin is helping with pain. Does feel like the pain is slowly improving.  Normal neurologic exam in office today.  Spurling maneuver negative.  Pain may be related to her cervical stenosis although she does not have any neck pain.  Patient does have history of neuromyelitis optica with T2 signal abnormality at the C4-C5 level consistent with chronic myelomalacia on MRI in 2021. Unclear if this may be related to her acute pain. Will discuss with her neurologist.   Continue gabapentin as needed for pain PT order for cervical stenosis Discussed with her neurologist who is arranging repeat MRI and EMG to further evaluate

## 2022-06-08 NOTE — Assessment & Plan Note (Signed)
Continues to have intermittent pruritus of the vulva especially after urinating.  No rashes or itching today.  We discussed that [place] Pepper may be contributing to her symptoms but she would like to hold off on making medication changes today.

## 2022-06-08 NOTE — Assessment & Plan Note (Signed)
BP improved to 135/79 with addition of losartan 25 mg daily. BMP today.

## 2022-06-08 NOTE — Telephone Encounter (Signed)
Ethlyn Gallery: [id number] exp. 06/08/22-07/08/22 sent to GI [phone]

## 2022-06-09 ENCOUNTER — Encounter: Payer: Medicare HMO | Admitting: Student

## 2022-06-14 ENCOUNTER — Other Ambulatory Visit: Payer: Self-pay | Admitting: Diagnostic Neuroimaging

## 2022-06-14 DIAGNOSIS — G360 Neuromyelitis optica [Devic]: Secondary | ICD-10-CM

## 2022-06-15 NOTE — Progress Notes (Signed)
Internal Medicine Clinic Attending  I saw and evaluated the patient.  I personally confirmed the key portions of the history and exam documented by Dr. Elaina Pattee and I reviewed pertinent patient test results.  The assessment, diagnosis, and plan were formulated together and I agree with the documentation in the resident's note.   Neurology planning for repeat MRI C-spine and EMG given new left arm symptoms with history of NMO and possible prior spinal lesion. Michelle Hurley reports the area on her left buttock is asymptomatic. This appears to be a small abrasion. Previously reported vulvar symptoms were only similar feeling to touch per [name] report, otherwise vulvar symptoms were different. I do not suspect these are related at this time.

## 2022-06-15 NOTE — Addendum Note (Signed)
Addended by: Dickie La on: 06/15/2022 09:43 AM   Modules accepted: Level of Service

## 2022-06-22 ENCOUNTER — Telehealth: Payer: Self-pay

## 2022-06-22 NOTE — Telephone Encounter (Signed)
Pharmacy Patient Advocate Encounter  Prior Authorization for Rituxan 500MG /50ML solution has been APPROVED by [facility] from 01/09/2022 to 01/09/2023.   PA # PA Case ID #: [id number]  Key: [id number]

## 2022-06-22 NOTE — Telephone Encounter (Signed)
I have informed the intrafusion on the approval

## 2022-07-03 ENCOUNTER — Ambulatory Visit
Admission: RE | Admit: 2022-07-03 | Discharge: 2022-07-03 | Disposition: A | Discharge status: Home / Self Care | Payer: Medicare HMO | Source: Ambulatory Visit | Attending: Diagnostic Neuroimaging | Admitting: Diagnostic Neuroimaging

## 2022-07-03 DIAGNOSIS — R202 Paresthesia of skin: Secondary | ICD-10-CM | POA: Diagnosis not present

## 2022-07-03 DIAGNOSIS — R200 Anesthesia of skin: Secondary | ICD-10-CM | POA: Diagnosis not present

## 2022-07-03 DIAGNOSIS — G360 Neuromyelitis optica [Devic]: Secondary | ICD-10-CM | POA: Diagnosis not present

## 2022-07-03 MED ORDER — GADOPICLENOL 0.5 MMOL/ML IV SOLN
10.0000 mL | Freq: Once | INTRAVENOUS | Status: AC | PRN
  Administered 2022-07-03: 10 mL via INTRAVENOUS

## 2022-07-06 ENCOUNTER — Encounter: Payer: Medicare HMO | Admitting: Diagnostic Neuroimaging

## 2022-07-11 ENCOUNTER — Telehealth: Payer: Self-pay | Admitting: Diagnostic Neuroimaging

## 2022-07-11 DIAGNOSIS — H469 Unspecified optic neuritis: Secondary | ICD-10-CM | POA: Diagnosis not present

## 2022-07-11 NOTE — Telephone Encounter (Signed)
Patient came in the office today and I informed her per Dr. Dierdre Highman " Stable MRI cervical spine. No new issues. Pt verbalized understanding. Pt had no questions at this time but was encouraged to call back if questions arise.

## 2022-07-11 NOTE — Telephone Encounter (Signed)
Patient came in for infusion and wanted to know if someone will call her about her MRI results. She wasn't even sure why she has an MRI. Pt having an infusion until later this afternoon.

## 2022-07-26 DIAGNOSIS — H469 Unspecified optic neuritis: Secondary | ICD-10-CM | POA: Diagnosis not present

## 2022-08-24 ENCOUNTER — Ambulatory Visit (INDEPENDENT_AMBULATORY_CARE_PROVIDER_SITE_OTHER): Payer: Self-pay | Admitting: Diagnostic Neuroimaging

## 2022-08-24 ENCOUNTER — Encounter: Payer: Medicare HMO | Admitting: Diagnostic Neuroimaging

## 2022-08-24 ENCOUNTER — Ambulatory Visit: Payer: Medicare HMO | Admitting: Diagnostic Neuroimaging

## 2022-08-24 VITALS — Ht 62.0 in | Wt 209.0 lb

## 2022-08-24 DIAGNOSIS — R200 Anesthesia of skin: Secondary | ICD-10-CM | POA: Diagnosis not present

## 2022-08-24 DIAGNOSIS — G360 Neuromyelitis optica [Devic]: Secondary | ICD-10-CM

## 2022-08-24 DIAGNOSIS — R202 Paresthesia of skin: Secondary | ICD-10-CM

## 2022-08-24 DIAGNOSIS — Z0289 Encounter for other administrative examinations: Secondary | ICD-10-CM

## 2022-08-25 NOTE — Procedures (Signed)
[facility]  NCS (NERVE CONDUCTION STUDY) WITH EMG (ELECTROMYOGRAPHY) REPORT   STUDY DATE: 08/24/22 PATIENT NAME: Michelle Hurley DOB: 1950/09/15 MRN: [id number]  ORDERING CLINICIAN: Joycelyn Schmid, MD   TECHNOLOGIST: Jenelle Mages ELECTROMYOGRAPHER: Glenford Bayley. Ronnita Paz, MD  CLINICAL INFORMATION: 72 year old female with left arm numbness.  FINDINGS: NERVE CONDUCTION STUDY:  Bilateral median and bilateral ulnar motor responses are normal.  Bilateral ulnar F-wave latencies are normal.  Bilateral ulnar sensory responses are normal.  Bilateral median sensory sponsors have slightly prolonged peak latencies and normal amplitudes.  Bilateral median to ulnar transcarpal comparison studies show prolonged peak latency differences bilaterally.   NEEDLE ELECTROMYOGRAPHY:  Needle examination of left upper extremity is notable for decreased discrete recruitment of large motor units in the left biceps and left triceps on exertion.  No abnormal spontaneous activity.  Left cervical paraspinal muscles unremarkable.Marland Kitchen   IMPRESSION:   Abnormal study demonstrating: - Mild bilateral median neuropathies at the wrist consistent with mild bilateral carpal tunnel syndrome. - Chronic denervation left biceps and left triceps may related to chronic left cervical (C6, C7) radiculopathies.     INTERPRETING PHYSICIAN:  Suanne Marker, MD Certified in Neurology, Neurophysiology and Neuroimaging  [facility] [address] [place], [place] [address] [phone]   [facility]    Nerve / Sites Muscle Latency Ref. Amplitude Ref. Rel Amp Segments Distance Velocity Ref. Area    ms ms mV mV %  cm m/s m/s mVms  L Median - APB     Wrist APB 3.6 ?4.4 9.6 ?4.0 100 Wrist - APB 7   34.8     Upper arm APB 8.2  9.1  95.1 Upper arm - Wrist 23 50 ?49 35.0  R Median - APB     Wrist APB 4.0 ?4.4 8.1 ?4.0 100 Wrist - APB 7   27.7     Upper arm APB 8.7  6.6  80.8 Upper  arm - Wrist 24.6 52 ?49 23.2  L Ulnar - ADM     Wrist ADM 2.8 ?3.3 8.5 ?6.0 100 Wrist - ADM 7   31.6     B.Elbow ADM 4.8  7.4  87.4 B.Elbow - Wrist 11 55 ?49 29.0     A.Elbow ADM 7.8  8.1  109 A.Elbow - B.Elbow 16 53 ?49 33.4  R Ulnar - ADM     Wrist ADM 2.7 ?3.3 8.5 ?6.0 100 Wrist - ADM 7   34.4     B.Elbow ADM 4.8  8.3  98.7 B.Elbow - Wrist 11 53 ?49 35.9     A.Elbow ADM 7.7  8.2  98.1 A.Elbow - B.Elbow 15 52 ?49 33.6             SNC    Nerve / Sites Rec. Site Peak Lat Ref.  Amp Ref. Segments Distance Peak Diff Ref.    ms ms V V  cm ms ms  L Median, Ulnar - Transcarpal comparison     Median Palm Wrist 2.5 ?2.2 38 ?35 Median Palm - Wrist 8       Ulnar Palm Wrist 2.0 ?2.2 18 ?12 Ulnar Palm - Wrist 8          Median Palm - Ulnar Palm  0.5 ?0.4  R Median, Ulnar - Transcarpal comparison     Median Palm Wrist 2.9 ?2.2 33 ?35 Median Palm - Wrist 8       Ulnar Palm Wrist 2.1 ?2.2 6 ?12 Ulnar Palm -  Wrist 8          Median Palm - Ulnar Palm  0.8 ?0.4  L Median - Orthodromic (Dig II, Mid palm)     Dig II Wrist 3.6 ?3.4 15 ?10 Dig II - Wrist 13    R Median - Orthodromic (Dig II, Mid palm)     Dig II Wrist 4.1 ?3.4 11 ?10 Dig II - Wrist 13    L Ulnar - Orthodromic, (Dig V, Mid palm)     Dig V Wrist 2.9 ?3.1 10 ?5 Dig V - Wrist 11    R Ulnar - Orthodromic, (Dig V, Mid palm)     Dig V Wrist 3.0 ?3.1 9 ?5 Dig V - Wrist 78                   F  Wave    Nerve F Lat Ref.   ms ms  L Ulnar - ADM 30.2 ?32.0  R Ulnar - ADM 28.6 ?32.0         EMG Summary Table    Spontaneous MUAP Recruitment  Muscle IA Fib PSW Fasc Other Amp Dur. Poly Pattern  L. Deltoid Normal None None None _______ Normal Normal Normal Normal  L. Biceps brachii Normal None None None _______ Increased Normal Normal Discrete  L. Triceps brachii Normal None None None _______ Increased Normal Normal Discrete  L. Flexor carpi radialis Normal None None None _______ Normal Normal Normal Normal  L. First dorsal interosseous Normal  None None None _______ Normal Normal Normal Normal  L. Cervical paraspinals Normal None None None _______ Normal Normal Normal Normal

## 2022-09-06 ENCOUNTER — Telehealth: Payer: Self-pay | Admitting: *Deleted

## 2022-09-06 ENCOUNTER — Ambulatory Visit (INDEPENDENT_AMBULATORY_CARE_PROVIDER_SITE_OTHER): Payer: Medicare HMO | Admitting: Internal Medicine

## 2022-09-06 ENCOUNTER — Other Ambulatory Visit (HOSPITAL_COMMUNITY)
Admission: RE | Admit: 2022-09-06 | Discharge: 2022-09-06 | Disposition: A | Discharge status: Home / Self Care | Payer: Medicare HMO | Source: Ambulatory Visit | Attending: Student in an Organized Health Care Education/Training Program | Admitting: Student in an Organized Health Care Education/Training Program

## 2022-09-06 ENCOUNTER — Encounter: Payer: Self-pay | Admitting: Internal Medicine

## 2022-09-06 VITALS — BP 103/69 | HR 66 | Temp 98.2°F | Wt 205.3 lb

## 2022-09-06 DIAGNOSIS — L292 Pruritus vulvae: Secondary | ICD-10-CM | POA: Diagnosis not present

## 2022-09-06 DIAGNOSIS — Z0000 Encounter for general adult medical examination without abnormal findings: Secondary | ICD-10-CM

## 2022-09-06 LAB — CERVICOVAGINAL ANCILLARY ONLY
Candida Glabrata: NEGATIVE
Candida Vaginitis: POSITIVE — AB
Comment: NEGATIVE
Comment: NEGATIVE

## 2022-09-06 LAB — GLUCOSE, CAPILLARY: Glucose-Capillary: 114 mg/dL — ABNORMAL HIGH (ref 70–99)

## 2022-09-06 NOTE — Assessment & Plan Note (Addendum)
States she is fully Covid vaccinated and has received both Shingrix shots

## 2022-09-06 NOTE — Progress Notes (Addendum)
Subjective:  CC: vaginal burning, itching, blood  HPI:  [name] is a 72 y.o. female with a past medical history stated below and presents today for above. Please see problem based assessment and plan for additional details.  Past Medical History:  Diagnosis Date   Abdominal pain 05/22/2014   Allergic rhinitis 04/16/2015   Anxiety state, unspecified    ARTHRALGIA UNSPECIFIED SITE 12/04/2008   Qualifier: Diagnosis of  By: Sherene Sires MD, Charlaine Dalton    Arthritis    Atherosclerosis of abdominal aorta (HCC) 10/01/2015   Aortoiliac atherosclerosis noted on CT abdomen in 2016.   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 01/2005 Echo: EF 55-65%   Osteopenia    Other and unspecified hyperlipidemia    Palpitations    Plantar fasciitis, bilateral 06/12/2016   Plantar fasciitis, bilateral 06/12/2016   Rash 03/23/2017   Right ankle injury, initial encounter 06/12/2016   Right elbow pain 06/11/2017   Right leg pain 09/06/2016   Right optic neuritis 01/22/2018   Sciatica 01/15/2018   Shoulder pain, bilateral (Concern for CAD) 08/28/2018   Shoulder pain, bilateral (Concern for CAD) 08/28/2018   Umbilical hernia    Unspecified essential hypertension    Vaginal candidiasis 02/12/2018   VENTRICULAR TACHYCARDIA 12/04/2008   F/u  [facility]     Viral upper respiratory tract infection 01/15/2018   Vitamin D deficiency 02/12/2018    Current Outpatient Medications on File Prior to Visit   Medication Sig Dispense Refill   acetaminophen (TYLENOL) 500 MG tablet Take 500 mg by mouth every 6 (six) hours as needed.     aspirin EC 81 MG tablet Take 81 mg by mouth daily. Swallow whole.     atorvastatin (LIPITOR) 80 MG tablet Take 1 tablet (80 mg total) by mouth daily. 90 tablet 3   calcium-vitamin D 250-100 MG-UNIT tablet Take 1 tablet by mouth 2 (two) times daily. 30 tablet 3   carvedilol (COREG) 25 MG tablet TAKE 1 TABLET TWICE DAILY WITH MEALS 180 tablet 3   diclofenac Sodium (VOLTAREN) 1 % GEL APPLY 2 GRAMS TOPICALLY 4 (FOUR) TIMES DAILY AS NEEDED. 100 g 2   ezetimibe (ZETIA) 10 MG tablet Take 1 tablet (10 mg total) by mouth daily. 90 tablet 3   furosemide (LASIX) 40 MG tablet TAKE 1 TABLET EVERY DAY 90 tablet 3   gabapentin (NEURONTIN) 300 MG capsule Take 1 capsule (300 mg total) by mouth 3 (three) times daily as needed. 60 capsule 0   Heating Pads (SUNBEAM STANDARD HEATING PAD) PADS Please use as directed on the instructions included. 1 each 0   JARDIANCE 10 MG TABS tablet TAKE 1 TABLET EVERY DAY 90 tablet 3   levocetirizine (XYZAL) 5 MG tablet TAKE 1 TABLET EVERY EVENING 90 tablet 1   losartan (COZAAR) 25 MG tablet Take 1 tablet (25 mg total) by mouth daily. 90 tablet 3   polyethylene glycol powder (GLYCOLAX/MIRALAX) powder Take 17 g  by mouth daily as needed for moderate constipation or severe constipation. 255 g 0   riTUXimab (RITUXAN) 500 MG/50ML injection INFUSE 1000MG  INTRAVENOUSLY ON DAY 1 & DAY 15 EVERY 6 MONTHS 200 mL 1   spironolactone (ALDACTONE) 25 MG tablet TAKE 1 TABLET EVERY DAY 90 tablet 3   verapamil (CALAN) 120 MG tablet TAKE 1/2 TABLET THREE TIMES DAILY 135 tablet 3   Zinc Oxide (DESITIN RAPID RELIEF) 13 % CREA Apply 1 application  topically daily. 454 g 0   No current facility-administered medications on file prior to visit.    Review of Systems: ROS negative except for as is noted on the assessment and plan.  Objective:   Vitals:   09/06/22 0959  09/06/22 1007  BP: (!) 89/64 103/69  Pulse: 72 66  Temp:  98.2 F (36.8 C)  TempSrc:  Oral  SpO2: 98%   Weight: 205 lb 4.8 oz (93.1 kg)     Physical Exam: Constitutional: well-appearing, in no acute distress HENT: normocephalic atraumatic, mucous membranes moist Eyes: conjunctiva non-erythematous Neck: supple Cardiovascular: regular rate and rhythm, no m/r/g Pulmonary/Chest: normal work of breathing on room air, lungs clear to auscultation bilaterally Abdominal: soft, non-tender, non-distended MSK: normal bulk and tone Neurological: alert & oriented x 3, 5/5 strength in bilateral upper and lower extremities, normal gait Skin: warm and dry  Assessment & Plan:   Vulvar itching Patient presents today with several weeks of vulvar itching and pain with new dysuria and some blood while wiping. Patient has a history of T2DM and has been on Jardiance for approximately 2 years. She states her current symptoms are similar to previous episodes of yeast infections. Discussed mechanism of Jardiance and how it creates high risk for candida infections. Denies current sexual activity, vaginal discharge, abdominal or flank pain. Differential includes vulvovaginal candidiasis, bacterial vaginosis, UTI, vaginal atrophy.   -Self swab, UTI performed. Will call tomorrow with results and order fluconazole if appropriate   Healthcare maintenance States she is fully Covid vaccinated and has received both Shingrix shots   Patient seen with Dr. Victorino December MD [facility]  PGY-1 Pager: [phone] Date 09/06/2022  Time 11:03 AM

## 2022-09-06 NOTE — Telephone Encounter (Signed)
Call from patient with c/o vaginal Itching x 3 weeks.  Now has burning  and Vaginal area is raw.  Given appointment for today at 10:15 AM.

## 2022-09-06 NOTE — Patient Instructions (Signed)
Ms. Brookes:   To summarize our visit today:   Your itching and burning could be due to a number of things such as fungal/yeast infection, atrophy or dryness or urinary tract infection. Today we will be testing for infection with a swab and urine analysis. I will call you tomorrow with the results and order a prescription if needed.   It was a pleasure meeting you today  Monna Fam, MD

## 2022-09-06 NOTE — Assessment & Plan Note (Signed)
Patient presents today with several weeks of vulvar itching and pain with new dysuria and some blood while wiping. Patient has a history of T2DM and has been on Jardiance for approximately 2 years. She states her current symptoms are similar to previous episodes of yeast infections. Discussed mechanism of Jardiance and how it creates high risk for candida infections. Denies current sexual activity, vaginal discharge, abdominal or flank pain. Differential includes vulvovaginal candidiasis, bacterial vaginosis, UTI, vaginal atrophy.   -Self swab, UTI performed. Will call tomorrow with results and order fluconazole if appropriate

## 2022-09-06 NOTE — Addendum Note (Signed)
Addended by: Monna Fam on: 09/06/2022 01:15 PM   Modules accepted: Orders

## 2022-09-07 ENCOUNTER — Other Ambulatory Visit: Payer: Self-pay | Admitting: Internal Medicine

## 2022-09-07 MED ORDER — FLUCONAZOLE 100 MG PO TABS: 150.0000 mg | ORAL_TABLET | Freq: Every day | ORAL | 0 refills | Status: AC

## 2022-09-07 NOTE — Addendum Note (Signed)
Addended by: Monna Fam on: 09/07/2022 08:47 AM   Modules accepted: Level of Service

## 2022-09-07 NOTE — Progress Notes (Signed)
Internal Medicine Clinic Attending  I was physically present during the key portions of the resident provided service and participated in the medical decision making of patient's management care. I reviewed pertinent patient test results.  The assessment, diagnosis, and plan were formulated together and I agree with the documentation in the resident's note.  Erlinda Hong, MD FACP

## 2022-09-28 ENCOUNTER — Ambulatory Visit: Payer: Medicare HMO

## 2022-09-28 DIAGNOSIS — Z0000 Encounter for general adult medical examination without abnormal findings: Secondary | ICD-10-CM

## 2022-09-28 NOTE — Patient Instructions (Addendum)
Michelle Hurley , Thank you for taking time to come for your Medicare Wellness Visit. I appreciate your ongoing commitment to your health goals. Please review the following plan we discussed and let me know if I can assist you in the future.   Referrals/Orders/Follow-Ups/Clinician Recommendations: none  This is a list of the screening recommended for you and due dates:  Health Maintenance  Topic Date Due   Complete foot exam   05/12/2022   Flu Shot  08/10/2022   COVID-19 Vaccine (4 - 2023-24 season) 09/10/2022   Hemoglobin A1C  10/31/2022   Eye exam for diabetics  04/17/2023   Yearly kidney health urinalysis for diabetes  05/01/2023   Yearly kidney function blood test for diabetes  06/06/2023   Medicare Annual Wellness Visit  09/28/2023   Mammogram  04/20/2024   Colon Cancer Screening  01/26/2025   DTaP/Tdap/Td vaccine (2 - Td or Tdap) 04/07/2027   Pneumonia Vaccine  Completed   DEXA scan (bone density measurement)  Completed   Hepatitis C Screening  Completed   Zoster (Shingles) Vaccine  Completed   HPV Vaccine  Aged Out    Advanced directives: (In Chart) A copy of your advanced directives are scanned into your chart should your provider ever need it.  Next Medicare Annual Wellness Visit scheduled for next year: Yes  Insert Preventive Care attachment Insert FALL PREVENTION attachment if needed

## 2022-09-28 NOTE — Progress Notes (Signed)
Subjective:   Michelle Hurley is a 72 y.o. female who presents for Medicare Annual (Subsequent) preventive examination.  Visit Complete: Virtual  I connected with  Michelle Hurley on 09/28/22 by a audio enabled telemedicine application and verified that I am speaking with the correct person using two identifiers.  Patient Location: Home  Provider Location: Office/Clinic  I discussed the limitations of evaluation and management by telemedicine. The patient expressed understanding and agreed to proceed.  Vital Signs: Unable to obtain new vitals due to this being a telehealth visit.  Cardiac Risk Factors include: advanced age (>43men, >64 women);diabetes mellitus;dyslipidemia;hypertension     Objective:    Today's Vitals   There is no height or weight on file to calculate BMI.     09/28/2022   12:25 PM 06/06/2022    4:04 PM 05/01/2022    9:53 AM 02/03/2022    9:14 AM 09/20/2021    8:58 AM 08/22/2021   10:24 AM 05/11/2021    9:17 AM  Advanced Directives  Does Patient Have a Medical Advance Directive? Yes Yes Yes Yes Yes Yes Yes  Type of Estate agent of [place];Living will Living will;Healthcare Power of [employer] Power of [place];Living will Healthcare Power of [place];Living will Living will Healthcare Power of [place];Living will Healthcare Power of [place];Living will  Does patient want to make changes to medical advance directive?  No - Patient declined No - Patient declined No - Patient declined  No - Patient declined   Copy of Healthcare Power of Attorney in Chart? Yes - validated most recent copy scanned in chart (See row information) Yes - validated most recent copy scanned in chart (See row information) Yes - validated most recent copy scanned in chart (See row information) Yes - validated most recent copy scanned in chart (See row information)  Yes - validated most recent copy scanned in chart (See row information) Yes - validated most recent  copy scanned in chart (See row information)    Current Medications (verified) Outpatient Encounter Medications as of 09/28/2022  Medication Sig   acetaminophen (TYLENOL) 500 MG tablet Take 500 mg by mouth every 6 (six) hours as needed.   aspirin EC 81 MG tablet Take 81 mg by mouth daily. Swallow whole.   atorvastatin (LIPITOR) 80 MG tablet Take 1 tablet (80 mg total) by mouth daily.   calcium-vitamin D 250-100 MG-UNIT tablet Take 1 tablet by mouth 2 (two) times daily.   carvedilol (COREG) 25 MG tablet TAKE 1 TABLET TWICE DAILY WITH MEALS   diclofenac Sodium (VOLTAREN) 1 % GEL APPLY 2 GRAMS TOPICALLY 4 (FOUR) TIMES DAILY AS NEEDED.   furosemide (LASIX) 40 MG tablet TAKE 1 TABLET EVERY DAY   gabapentin (NEURONTIN) 300 MG capsule Take 1 capsule (300 mg total) by mouth 3 (three) times daily as needed.   Heating Pads (SUNBEAM STANDARD HEATING PAD) PADS Please use as directed on the instructions included.   JARDIANCE 10 MG TABS tablet TAKE 1 TABLET EVERY DAY   levocetirizine (XYZAL) 5 MG tablet TAKE 1 TABLET EVERY EVENING   losartan (COZAAR) 25 MG tablet Take 1 tablet (25 mg total) by mouth daily.   polyethylene glycol powder (GLYCOLAX/MIRALAX) powder Take 17 g by mouth daily as needed for moderate constipation or severe constipation.   riTUXimab (RITUXAN) 500 MG/50ML injection INFUSE 1000MG  INTRAVENOUSLY ON DAY 1 & DAY 15 EVERY 6 MONTHS   spironolactone (ALDACTONE) 25 MG tablet TAKE 1 TABLET EVERY DAY   verapamil (CALAN) 120 MG tablet  TAKE 1/2 TABLET THREE TIMES DAILY   Zinc Oxide (DESITIN RAPID RELIEF) 13 % CREA Apply 1 application  topically daily.   ezetimibe (ZETIA) 10 MG tablet Take 1 tablet (10 mg total) by mouth daily. (Patient not taking: Reported on 09/28/2022)   No facility-administered encounter medications on file as of 09/28/2022.    Allergies (verified) Mushroom extract complex, Codeine, Hydrocodone, Prednisone, and Omeprazole   History: Past Medical History:  Diagnosis Date    Abdominal pain 05/22/2014   Allergic rhinitis 04/16/2015   Anxiety state, unspecified    ARTHRALGIA UNSPECIFIED SITE 12/04/2008   Qualifier: Diagnosis of  By: Sherene Sires MD, Charlaine Dalton    Arthritis    Atherosclerosis of abdominal aorta (HCC) 10/01/2015   Aortoiliac atherosclerosis noted on CT abdomen in 2016.   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 01/2005 Echo: EF 55-65%   Osteopenia    Other and unspecified hyperlipidemia    Palpitations    Plantar fasciitis, bilateral 06/12/2016   Plantar fasciitis, bilateral 06/12/2016   Rash 03/23/2017   Right ankle injury, initial encounter 06/12/2016   Right elbow pain 06/11/2017   Right leg pain 09/06/2016   Right optic neuritis 01/22/2018   Sciatica 01/15/2018   Shoulder pain, bilateral (Concern for CAD) 08/28/2018   Shoulder pain, bilateral (Concern for CAD) 08/28/2018   Umbilical hernia    Unspecified essential hypertension    Vaginal candidiasis 02/12/2018   VENTRICULAR TACHYCARDIA 12/04/2008   F/u  [facility]     Viral upper respiratory tract infection 01/15/2018   Vitamin D deficiency 02/12/2018   Past Surgical History:  Procedure Laterality Date   ABDOMINAL HYSTERECTOMY  01/10/1972   BREAST BIOPSY Left 04/14/2019   ESOPHAGOGASTRODUODENOSCOPY N/A 05/28/2014   Procedure: ESOPHAGOGASTRODUODENOSCOPY (EGD);  Surgeon: Dorena Cookey, MD;  Location: [facility];  Service: Endoscopy;  Laterality: N/A;   LAPAROSCOPIC SMALL BOWEL RESECTION N/A  06/04/2014   Procedure: LAPAROSCOPIC SMALL BOWEL RESECTION;  Surgeon: Claud Kelp, MD;  Location: [facility];  Service: General;  Laterality: N/A;   LAPAROSCOPIC TRANSABDOMINAL HERNIA N/A 05/03/2014   Procedure: LAPAROSCOPIC EXPLORATION WITH OPEN REPAIR OF INCARCERATED EPIGASTRIC HERNIA ;  Surgeon: Ovidio Kin, MD;  Location: [facility];  Service: General;  Laterality: N/A;   Family History  Problem Relation Age of Onset   Heart disease Mother    Diabetes Mother    Lung cancer Mother    Hypertension Brother    Heart attack Brother    Diabetes Sister    Breast cancer Sister    Diabetes Sister    Social History   Socioeconomic History   Marital status: Divorced    Spouse name: Not on file   Number of children: 2   Years of education: college   Highest education level: Not on file  Occupational History   Occupation: Retired  Tobacco Use   Smoking status: Never   Smokeless tobacco: Never  Vaping Use   Vaping status: Never Used  Substance and Sexual Activity   Alcohol use: No    Alcohol/week: 0.0 standard drinks of alcohol   Drug use: No   Sexual activity: Not on file  Other Topics Concern   Not on file  Social History Narrative   Current Social History 04/02/2020        Patient lives alone in an apartment which is 1 story. There are not steps up to the entrance.       Patient's method of transportation is personal car.      The highest level of education was some college.      The patient currently retired.      Identified important Relationships are "God and family"       Pets : None       Interests / Fun: Travel with her sisters, although this has stopped d/t Covid       Current Stressors: Right shoulder to lower-mid back pain X 3-4 months        Social Determinants of Health   Financial Resource Strain: Low Risk  (09/28/2022)   Overall Financial Resource Strain (CARDIA)    Difficulty of Paying Living Expenses: Not hard at all  Food Insecurity: No Food  Insecurity (09/28/2022)   Hunger Vital Sign    Worried About Running Out of Food in the Last Year: Never true    Ran Out of Food in the Last Year: Never true  Transportation Needs: No Transportation Needs (09/28/2022)   PRAPARE - Administrator, Civil Service (Medical): No    Lack of Transportation (Non-Medical): No  Physical Activity: Inactive (09/28/2022)   Exercise Vital Sign    Days of Exercise per Week: 0 days    Minutes of Exercise per Session: 0 min  Stress: No Stress Concern Present (09/28/2022)   [employer] of Occupational Health - Occupational Stress Questionnaire    Feeling of Stress : Not at all  Social Connections: Moderately Isolated (09/28/2022)   Social Connection and Isolation Panel [NHANES]    Frequency of Communication with Friends and Family: More than three times a week    Frequency of Social Gatherings with Friends and Family: More than three times a week    Attends Religious Services: More than 4 times per year    Active Member of [employer] or Organizations: No    Attends Engineer, structural: Never    Marital Status: Divorced    Tobacco Counseling Counseling given: Not Answered   Clinical Intake:  Pre-visit preparation completed: Yes  Pain : No/denies pain     Nutritional Risks: None Diabetes: Yes CBG done?: No Did pt. bring in CBG monitor from home?: No  How often do you need to have someone help you when you read instructions, pamphlets, or other written materials from your doctor or pharmacy?: 1 - Never  Interpreter Needed?: No  Information entered by :: NAllen LPN   Activities of Daily Living    09/28/2022   12:12 PM 09/06/2022   10:17 AM  In your present state of health, do you have any difficulty performing the following activities:  Hearing? 0 0  Vision? 0 0  Difficulty concentrating or making decisions? 0 0  Walking or climbing stairs? 0 0  Comment gets out of breath   Dressing or bathing? 0 0  Doing errands,  shopping? 0 0  Preparing Food and eating ? N   Using the Toilet? N   In the past six months, have you accidently  leaked urine? Y   Comment nothing new since hernia operation   Do you have problems with loss of bowel control? N   Managing your Medications? N   Managing your Finances? N   Housekeeping or managing your Housekeeping? N     Patient Care Team: Dickie La, MD as PCP - General (Internal Medicine) Jake Bathe, MD as PCP - Cardiology (Cardiology) Quentin Angst, MD (Internal Medicine) Glyn Ade, PA-C as Physician Assistant (Dermatology) Janalyn Harder, MD (Inactive) as Consulting Physician (Dermatology) Marjory Lies, Glenford Bayley, MD as Consulting Physician (Neurology) [facility]  Indicate any recent Medical Services you may have received from other than [facility] providers in the past year (date may be approximate).     Assessment:   This is a routine wellness examination for [place]ler.  Hearing/Vision screen Hearing Screening - Comments:: Denies hearing issues Vision Screening - Comments:: Regular eye exams, [facility]   Goals Addressed             This Visit's Progress    Patient Stated       09/28/2022, denies goals at this time       Depression Screen    09/28/2022   12:28 PM 09/06/2022   10:17 AM 06/06/2022    4:04 PM 05/01/2022    9:52 AM 02/03/2022    9:27 AM 09/20/2021    8:57 AM 08/22/2021   10:25 AM  PHQ 2/9 Scores  PHQ - 2 Score 0 0 0 0 0 0 0  PHQ- 9 Score 0          Fall Risk    09/28/2022   12:27 PM 09/06/2022   10:17 AM 06/06/2022    3:49 PM 05/01/2022    9:52 AM 02/03/2022    9:14 AM  Fall Risk   Falls in the past year? 0 0 0 0 0  Number falls in past yr: 0 0 0 0 0  Injury with Fall? 0 0 0 0 0  Risk for fall due to : Medication side effect No Fall Risks No Fall Risks No Fall Risks   Follow up Falls prevention discussed;Falls evaluation completed Falls evaluation completed Falls evaluation completed Falls  evaluation completed;Falls prevention discussed Falls evaluation completed    MEDICARE RISK AT HOME: Medicare Risk at Home Any stairs in or around the home?: Yes If so, are there any without handrails?: No Home free of loose throw rugs in walkways, pet beds, electrical cords, etc?: Yes Adequate lighting in your home to reduce risk of falls?: Yes Life alert?: No Use of a cane, walker or w/c?: No Grab bars in the bathroom?: Yes Shower chair or bench in shower?: No Elevated toilet seat or a handicapped toilet?: Yes  TIMED UP AND GO:  Was the test performed?  No    Cognitive Function:        09/28/2022   12:29 PM 09/20/2021    9:02 AM  6CIT Screen  What Year? 0 points 0 points  What month? 0 points 0 points  What time? 0 points 0 points  Count back from 20 0 points 0 points  Months in reverse 0 points 0 points  Repeat phrase 6 points 0 points  Total Score 6 points 0 points    Immunizations Immunization History  Administered Date(s) Administered   Influenza,inj,Quad PF,6+ Mos 10/07/2013, 10/01/2015, 01/15/2018, 09/25/2019   Moderna Sars-Covid-2 Vaccination 03/17/2019, 04/18/2019, 12/18/2019   Pneumococcal Conjugate-13 10/07/2013   Pneumococcal Polysaccharide-23 04/16/2015  RSV,unspecified 10/20/2021   Tdap 04/06/2017   Zoster Recombinant(Shingrix) 02/28/2020, 10/14/2020    TDAP status: Up to date  Flu Vaccine status: Due, Education has been provided regarding the importance of this vaccine. Advised may receive this vaccine at local pharmacy or Health Dept. Aware to provide a copy of the vaccination record if obtained from local pharmacy or Health Dept. Verbalized acceptance and understanding.  Pneumococcal vaccine status: Up to date  Covid-19 vaccine status: Information provided on how to obtain vaccines.   Qualifies for Shingles Vaccine? Yes   Zostavax completed No   Shingrix Completed?: No.    Education has been provided regarding the importance of this vaccine.  Patient has been advised to call insurance company to determine out of pocket expense if they have not yet received this vaccine. Advised may also receive vaccine at local pharmacy or Health Dept. Verbalized acceptance and understanding.  Screening Tests Health Maintenance  Topic Date Due   FOOT EXAM  05/12/2022   INFLUENZA VACCINE  08/10/2022   COVID-19 Vaccine (4 - 2023-24 season) 09/10/2022   HEMOGLOBIN A1C  10/31/2022   OPHTHALMOLOGY EXAM  04/17/2023   Diabetic kidney evaluation - Urine ACR  05/01/2023   Diabetic kidney evaluation - eGFR measurement  06/06/2023   Medicare Annual Wellness (AWV)  09/28/2023   MAMMOGRAM  04/20/2024   Colonoscopy  01/26/2025   DTaP/Tdap/Td (2 - Td or Tdap) 04/07/2027   Pneumonia Vaccine 65+ Years old  Completed   DEXA SCAN  Completed   Hepatitis C Screening  Completed   Zoster Vaccines- Shingrix  Completed   HPV VACCINES  Aged Out    Health Maintenance  Health Maintenance Due  Topic Date Due   FOOT EXAM  05/12/2022   INFLUENZA VACCINE  08/10/2022   COVID-19 Vaccine (4 - 2023-24 season) 09/10/2022    Colorectal cancer screening: Type of screening: Colonoscopy. Completed 01/27/2015. Repeat every 10 years  Mammogram status: scheduled for 10/23/2022  Bone Density status: Completed 08/09/2020.   Lung Cancer Screening: (Low Dose CT Chest recommended if Age 16-80 years, 20 pack-year currently smoking OR have quit w/in 15years.) does not qualify.   Lung Cancer Screening Referral: no  Additional Screening:  Hepatitis C Screening: does qualify; Completed 02/27/2018  Vision Screening: Recommended annual ophthalmology exams for early detection of glaucoma and other disorders of the eye. Is the patient up to date with their annual eye exam?  Yes  Who is the provider or what is the name of the office in which the patient attends annual eye exams? [facility] If pt is not established with a provider, would they like to be referred to a provider to  establish care? No .   Dental Screening: Recommended annual dental exams for proper oral hygiene  Diabetic Foot Exam: Diabetic Foot Exam: Overdue, Pt has been advised about the importance in completing this exam. Pt is scheduled for diabetic foot exam on next appointment.  Community Resource Referral / Chronic Care Management: CRR required this visit?  No   CCM required this visit?  No     Plan:     I have personally reviewed and noted the following in the patient's chart:   Medical and social history Use of alcohol, tobacco or illicit drugs  Current medications and supplements including opioid prescriptions. Patient is not currently taking opioid prescriptions. Functional ability and status Nutritional status Physical activity Advanced directives List of other physicians Hospitalizations, surgeries, and ER visits in previous 12 months Vitals Screenings to  include cognitive, depression, and falls Referrals and appointments  In addition, I have reviewed and discussed with patient certain preventive protocols, quality metrics, and best practice recommendations. A written personalized care plan for preventive services as well as general preventive health recommendations were provided to patient.     Barb Merino, LPN   [id number]/[id number]   After Visit Summary: (MyChart) Due to this being a telephonic visit, the after visit summary with patients personalized plan was offered to patient via MyChart   Nurse Notes: none

## 2022-10-03 ENCOUNTER — Telehealth: Payer: Self-pay

## 2022-10-03 NOTE — Telephone Encounter (Signed)
Requesting cough medication, please call pt back. No appt available for this week.

## 2022-10-03 NOTE — Telephone Encounter (Signed)
RTC to patient is coughing since last visit.  Is non-productive.  Wants to know if she can get something for.  No fever or chest pain.  Throat a little sore from the coughing.  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  Asked about the [employer] perhaps.  Can call medication to the [facility] on [address].

## 2022-10-05 NOTE — Telephone Encounter (Signed)
Patient was contacted via telephone to schedule an appointment next week with Dr. Sol Blazing.  Just spoke with patient and she was agreeable to be seen next Tuesday 10/10/2022 at 11:15 am with Dr. Sol Blazing.  Per Dr. Sol Blazing is okay to [facility] her clinic that day.

## 2022-10-10 ENCOUNTER — Encounter: Payer: Medicare HMO | Admitting: Internal Medicine

## 2022-10-10 NOTE — Progress Notes (Signed)
I reviewed the AWV findings from the provider who conducted the visit. I was present in the office suite and immediately available to provide assistance and direction throughout the time the service was provided. Last mammogram 09/30/2020, repeat annually and scheduled for 10/04/21. Last colonoscopy 01/27/2015 with [facility] per chart review, repeat in 10 years.

## 2022-10-11 DIAGNOSIS — R051 Acute cough: Secondary | ICD-10-CM | POA: Diagnosis not present

## 2022-10-19 ENCOUNTER — Other Ambulatory Visit: Payer: Self-pay | Admitting: Internal Medicine

## 2022-10-19 DIAGNOSIS — E7800 Pure hypercholesterolemia, unspecified: Secondary | ICD-10-CM

## 2022-11-05 ENCOUNTER — Other Ambulatory Visit: Payer: Self-pay | Admitting: Internal Medicine

## 2022-11-05 DIAGNOSIS — E785 Hyperlipidemia, unspecified: Secondary | ICD-10-CM

## 2022-11-10 ENCOUNTER — Other Ambulatory Visit: Payer: Self-pay

## 2022-11-10 DIAGNOSIS — M5432 Sciatica, left side: Secondary | ICD-10-CM

## 2022-11-10 MED ORDER — GABAPENTIN 300 MG PO CAPS: 300.0000 mg | ORAL_CAPSULE | Freq: Three times a day (TID) | ORAL | 5 refills | Status: AC | PRN

## 2022-11-13 ENCOUNTER — Ambulatory Visit (INDEPENDENT_AMBULATORY_CARE_PROVIDER_SITE_OTHER): Payer: Medicare HMO | Admitting: Diagnostic Neuroimaging

## 2022-11-13 ENCOUNTER — Encounter: Payer: Self-pay | Admitting: Diagnostic Neuroimaging

## 2022-11-13 VITALS — BP 123/72 | HR 70 | Ht 62.0 in | Wt 210.0 lb

## 2022-11-13 DIAGNOSIS — G360 Neuromyelitis optica [Devic]: Secondary | ICD-10-CM | POA: Diagnosis not present

## 2022-11-13 NOTE — Progress Notes (Signed)
[facility]  PATIENT: Michelle Hurley DOB: 10/16/1950  REFERRING CLINICIAN: Dickie La, MD  HISTORY FROM: patient and chart REASON FOR VISIT: follow up   HISTORICAL  CHIEF COMPLAINT:  Chief Complaint  Patient presents with   Follow-up    RM 6, here alone Pt is here following up on numbness and tingling of left arm. Pt states she has been doing better from the numbness and tingling.      HISTORY OF PRESENT ILLNESS:   UPDATE (11/13/22, VRP): Since last visit, doing well. Symptoms are stable. Severity is mild. No alleviating or aggravating factors. Tolerating rituxan.   UPDATE (03/14/22, VRP): Since last visit, doing well. Symptoms are stable. Severity is mild. No alleviating or aggravating factors. Tolerating rituxan.     UPDATE (06/22/21, VRP): Since last visit, doing well. Symptoms are stable. No alleviating or aggravating factors. Tolerating rituximab infusions.Marland Kitchen    UPDATE (12/15/20, VRP): Since last visit, doing well. Symptoms are stable. Pain is improved. No alleviating or aggravating factors. Tolerating rituximab.    UPDATE (10/22/2019, VRP): Since last visit, doing well until 3 to 4 weeks ago when she developed right thoracic and flank pain, numbness and tingling in her right axillary region and right triceps region.  Patient tried muscle relaxers from PCP without relief.  Patient tried over-the-counter topical pain relievers without relief.  Patient has had MRI of the brain, cervical and thoracic spine ordered and scheduled for the next few weeks.  No other triggering factors.  Some tenderness to palpation just below her shoulder blade on the right side.  Vision stable.  Lower extremity stable.  Left arm stable.  UPDATE (05/19/19, VRP): Since last visit, doing well. Had Moderna COVID vaccine in March / April 2021; doing well. Last rituxan infusion in Jan 2021. Symptoms are stable. No alleviating or aggravating factors.     UPDATE (11/18/18, VRP): Since last  visit, doing well. Symptoms are improved. Right eye vision almost back to normal. No alleviating or aggravating factors. Tolerating rituxan; next dose in Dec 2020.   VIDEO VISIT UPDATE (05/15/18, VRP):  - overall feels well - vision is improved - no pain - waiting on insurance approval for rituxan  PRIOR HPI (01/30/18): 72 year old female with hypertension, here for evaluation of right retrobulbar, idiopathic optic neuritis.  01/22/2018 patient presented to the hospital after seeing her ophthalmologist for new onset right eye vision loss.  She reported an altitudinal superior visual field defect with some pain.  No problems in the left eye.  Upon admission patient had MRI of the brain and orbits which showed enhancing right optic nerve lesion.  Mild chronic small vessel ischemic disease was also noted as well as scattered chronic cerebral microhemorrhages.  Patient was diagnosed with idiopathic optic neuritis.  She was treated with IV Solu-Medrol.  She had some side effects of hallucinations and irritability.  She completed her course and was discharged home.  Since that time symptoms are continuing to improve.  No problems with arms, legs, balance, bowel or bladder dysfunction.   REVIEW OF SYSTEMS: Full 14 system review of systems performed and negative with exception of: as per HPI.   ALLERGIES: Allergies  Allergen Reactions   Mushroom Extract Complex Anaphylaxis, Itching and Swelling    Swelling all over body    Codeine Nausea And Vomiting   Hydrocodone Nausea And Vomiting   Prednisone Nausea And Vomiting   Omeprazole Itching    HOME MEDICATIONS: Outpatient Medications Prior to Visit  Medication Sig Dispense Refill  acetaminophen (TYLENOL) 500 MG tablet Take 500 mg by mouth every 6 (six) hours as needed.     aspirin EC 81 MG tablet Take 81 mg by mouth daily. Swallow whole.     atorvastatin (LIPITOR) 80 MG tablet TAKE 1 TABLET EVERY DAY 90 tablet 3   calcium-vitamin D 250-100 MG-UNIT  tablet Take 1 tablet by mouth 2 (two) times daily. 30 tablet 3   carvedilol (COREG) 25 MG tablet TAKE 1 TABLET TWICE DAILY WITH MEALS 180 tablet 3   diclofenac Sodium (VOLTAREN) 1 % GEL APPLY 2 GRAMS TOPICALLY 4 (FOUR) TIMES DAILY AS NEEDED. 100 g 2   ezetimibe (ZETIA) 10 MG tablet Take 1 tablet (10 mg total) by mouth daily. 90 tablet 3   furosemide (LASIX) 40 MG tablet TAKE 1 TABLET EVERY DAY 90 tablet 3   gabapentin (NEURONTIN) 300 MG capsule Take 1 capsule (300 mg total) by mouth 3 (three) times daily as needed. 90 capsule 5   Heating Pads (SUNBEAM STANDARD HEATING PAD) PADS Please use as directed on the instructions included. 1 each 0   JARDIANCE 10 MG TABS tablet TAKE 1 TABLET EVERY DAY 90 tablet 3   levocetirizine (XYZAL) 5 MG tablet TAKE 1 TABLET EVERY EVENING 90 tablet 1   losartan (COZAAR) 25 MG tablet Take 1 tablet (25 mg total) by mouth daily. 90 tablet 3   polyethylene glycol powder (GLYCOLAX/MIRALAX) powder Take 17 g by mouth daily as needed for moderate constipation or severe constipation. 255 g 0   riTUXimab (RITUXAN) 500 MG/50ML injection INFUSE 1000MG  INTRAVENOUSLY ON DAY 1 & DAY 15 EVERY 6 MONTHS 200 mL 1   spironolactone (ALDACTONE) 25 MG tablet TAKE 1 TABLET EVERY DAY 90 tablet 3   verapamil (CALAN) 120 MG tablet TAKE 1/2 TABLET THREE TIMES DAILY 135 tablet 3   Zinc Oxide (DESITIN RAPID RELIEF) 13 % CREA Apply 1 application  topically daily. 454 g 0   No facility-administered medications prior to visit.    PHYSICAL EXAM  GENERAL EXAM/CONSTITUTIONAL: Vitals:  Vitals:   11/13/22 1343  BP: 123/72  Pulse: 70  Weight: 210 lb (95.3 kg)  Height: 5\' 2"  (1.575 m)    Body mass index is 38.41 kg/m. Wt Readings from Last 3 Encounters:  11/13/22 210 lb (95.3 kg)  09/06/22 205 lb 4.8 oz (93.1 kg)  08/24/22 209 lb (94.8 kg)   No results found.  Patient is in no distress; well developed, nourished and groomed; neck is supple  CARDIOVASCULAR: Examination of carotid  arteries is normal; no carotid bruits Regular rate and rhythm, no murmurs Examination of peripheral vascular system by observation and palpation is normal  EYES: Ophthalmoscopic exam of optic discs and posterior segments is normal; no papilledema or hemorrhages No results found.  MUSCULOSKELETAL: Gait, strength, tone, movements noted in Neurologic exam below  NEUROLOGIC: MENTAL STATUS:      No data to display         awake, alert, oriented to person, place and time recent and remote memory intact normal attention and concentration language fluent, comprehension intact, naming intact fund of knowledge appropriate  CRANIAL NERVE:  2nd - no papilledema on fundoscopic exam 2nd, 3rd, 4th, 6th - pupils 2mm equal and reactive to light, visual fields full to confrontation, extraocular muscles intact, no nystagmus 5th - facial sensation symmetric 7th - facial strength symmetric 8th - hearing intact 9th - palate elevates symmetrically, uvula midline 11th - shoulder shrug symmetric 12th - tongue protrusion midline  MOTOR:  normal bulk and tone, full strength in the BUE, BLE  SENSORY:  normal and symmetric to light touch, temperature, vibration  COORDINATION:  finger-nose-finger, fine finger movements normal  REFLEXES:  deep tendon reflexes present and symmetric  GAIT/STATION:  narrow based gait     DIAGNOSTIC DATA (LABS, IMAGING, TESTING) - I reviewed patient records, labs, notes, testing and imaging myself where available.  Lab Results  Component Value Date   WBC 4.8 03/14/2022   HGB 14.7 03/14/2022   HCT 45.2 03/14/2022   MCV 82 03/14/2022   PLT 279 03/14/2022      Component Value Date/Time   NA 139 06/06/2022 1646   K 4.2 06/06/2022 1646   CL 102 06/06/2022 1646   CO2 21 06/06/2022 1646   GLUCOSE 85 06/06/2022 1646   GLUCOSE 155 (H) 01/27/2018 0700   BUN 19 06/06/2022 1646   CREATININE 1.08 (H) 06/06/2022 1646   CREATININE 1.03 (H) 11/19/2014 1131    CALCIUM 9.4 06/06/2022 1646   PROT 6.5 02/09/2021 1526   ALBUMIN 4.1 02/09/2021 1526   AST 15 02/09/2021 1526   ALT 24 02/09/2021 1526   ALKPHOS 68 02/09/2021 1526   BILITOT <0.2 02/09/2021 1526   GFRNONAA 43 (L) 02/25/2020 1005   GFRNONAA 57 (L) 11/19/2014 1131   GFRAA 50 (L) 02/25/2020 1005   GFRAA 65 11/19/2014 1131   Lab Results  Component Value Date   CHOL 139 05/01/2022   HDL 34 (L) 05/01/2022   LDLCALC 85 05/01/2022   LDLDIRECT 182.8 04/27/2010   TRIG 111 05/01/2022   CHOLHDL 4.1 05/01/2022   Lab Results  Component Value Date   HGBA1C 6.9 (A) 05/01/2022   Lab Results  Component Value Date   VITAMINB12 664 01/23/2018   Lab Results  Component Value Date   TSH 0.98 04/27/2010   Lab Results  Component Value Date   ANA Negative 01/23/2018    01/30/18  NMO IgG Autoantibodies 0.0 - 3.0 U/mL 260.6High      11/10/2019 MRI brain with and without 1. No acute intracranial abnormality. 2. No specific evidence of demyelination. Mild scattered T2/FLAIR hyperintensities within the white matter, which are nonspecific but most suggestive of age-appropriate chronic microvascular ischemic disease. No abnormal enhancement. 3. Small prior microhemorrhages in the pons and right thalamus, most likely related to hypertension.   11/10/2019 MRI cervical spine without 1. Patchy T2 signal abnormality involving the bilateral hemi cord at the level of C4-5, most characteristic of chronic myelomalacia. No other signal abnormality within the cervical spinal cord to suggest demyelinating disease. 2. Multilevel cervical spondylosis with resultant mild to moderate diffuse spinal stenosis at C3-4 through C6-7. 3. Multifactorial degenerative changes with resultant moderate to severe C4 through C8 foraminal narrowing as detailed above, most notable at C5 and C6 on the right.  11/10/2019 MRI thoracic without 1. Normal MRI appearance of the thoracic spinal cord. No evidence for  demyelinating disease. 2. Mild multilevel degenerative spondylosis with resultant mild flattening of the left ventral cord at T4-5 through T8-9 as above. No significant spinal stenosis or neural impingement.  07/03/22 MRI cervical spine (with and without) demonstrating: -Chronic myelomalacia changes at C4 level.  No abnormal enhancing lesions.  No new spinal cord lesions since 2021. -Multilevel degenerative changes as above with mild spinal stenosis and variable foraminal stenosis.     ASSESSMENT AND PLAN  72 y.o. year old female here with right optic neuritis (right eye; retrobulbar) and positive NMO antibodies.  Dx:  1. Neuromyelitis optica (HCC)  PLAN:  Neuromyelitis optica (+ NMO ab; + optic neuritis; + ? spinal cord lesion) - continue rituximab every 6 months (Jan / July) - check CBC, immunoglobulin panel every 6 months (rituximab monitoring)  RIGHT THORACIC PAIN / RIGHT ARM PAIN / NUMBNESS - improved  Orders Placed This Encounter  Procedures   CBC with Differential/Platelet   Immunoglobulins, QN, A/E/G/M   Return in about 6 months (around 05/13/2023) for with NP.    Suanne Marker, MD 12/15/2020, 3:00PM Certified in Neurology, Neurophysiology and Neuroimaging  [facility] [address] [place], [place] [address] [phone]

## 2022-11-14 ENCOUNTER — Other Ambulatory Visit: Payer: Self-pay | Admitting: Internal Medicine

## 2022-11-14 ENCOUNTER — Ambulatory Visit
Admission: RE | Admit: 2022-11-14 | Discharge: 2022-11-14 | Disposition: A | Discharge status: Home / Self Care | Payer: Medicare HMO | Source: Ambulatory Visit | Attending: Internal Medicine | Admitting: Internal Medicine

## 2022-11-14 DIAGNOSIS — R921 Mammographic calcification found on diagnostic imaging of breast: Secondary | ICD-10-CM | POA: Diagnosis not present

## 2022-11-16 ENCOUNTER — Ambulatory Visit: Payer: Medicare HMO | Admitting: Diagnostic Neuroimaging

## 2022-11-16 LAB — IMMUNOGLOBULINS A/E/G/M, SERUM
IgA/Immunoglobulin A, Serum: 182 mg/dL (ref 64–422)
IgE (Immunoglobulin E), Serum: 15 [IU]/mL (ref 6–495)
IgG (Immunoglobin G), Serum: 940 mg/dL (ref 586–1602)
IgM (Immunoglobulin M), Srm: 25 mg/dL — ABNORMAL LOW (ref 26–217)

## 2022-11-16 LAB — CBC WITH DIFFERENTIAL/PLATELET
Basophils Absolute: 0 10*3/uL (ref 0.0–0.2)
Basos: 0 %
EOS (ABSOLUTE): 0.1 10*3/uL (ref 0.0–0.4)
Eos: 2 %
Hematocrit: 43.1 % (ref 34.0–46.6)
Hemoglobin: 14 g/dL (ref 11.1–15.9)
Immature Grans (Abs): 0 10*3/uL (ref 0.0–0.1)
Immature Granulocytes: 0 %
Lymphocytes Absolute: 1.3 10*3/uL (ref 0.7–3.1)
Lymphs: 28 %
MCH: 27.6 pg (ref 26.6–33.0)
MCHC: 32.5 g/dL (ref 31.5–35.7)
MCV: 85 fL (ref 79–97)
Monocytes Absolute: 0.6 10*3/uL (ref 0.1–0.9)
Monocytes: 13 %
Neutrophils Absolute: 2.6 10*3/uL (ref 1.4–7.0)
Neutrophils: 57 %
Platelets: 261 10*3/uL (ref 150–450)
RBC: 5.08 x10E6/uL (ref 3.77–5.28)
RDW: 14 % (ref 11.7–15.4)
WBC: 4.6 10*3/uL (ref 3.4–10.8)

## 2022-11-20 ENCOUNTER — Ambulatory Visit
Admission: RE | Admit: 2022-11-20 | Discharge: 2022-11-20 | Disposition: A | Discharge status: Home / Self Care | Payer: Medicare HMO | Source: Ambulatory Visit | Attending: Internal Medicine | Admitting: Internal Medicine

## 2022-11-20 DIAGNOSIS — R921 Mammographic calcification found on diagnostic imaging of breast: Secondary | ICD-10-CM

## 2022-11-20 DIAGNOSIS — N6322 Unspecified lump in the left breast, upper inner quadrant: Secondary | ICD-10-CM | POA: Diagnosis not present

## 2022-11-20 DIAGNOSIS — N6325 Unspecified lump in the left breast, overlapping quadrants: Secondary | ICD-10-CM | POA: Diagnosis not present

## 2022-11-20 HISTORY — PX: BREAST BIOPSY: SHX20

## 2022-11-21 LAB — SURGICAL PATHOLOGY

## 2022-12-20 ENCOUNTER — Other Ambulatory Visit (HOSPITAL_COMMUNITY)
Admission: RE | Admit: 2022-12-20 | Discharge: 2022-12-20 | Disposition: A | Discharge status: Home / Self Care | Payer: Medicare HMO | Source: Ambulatory Visit | Attending: Internal Medicine | Admitting: Internal Medicine

## 2022-12-20 ENCOUNTER — Ambulatory Visit: Payer: Medicare HMO | Admitting: Student

## 2022-12-20 VITALS — BP 113/70 | HR 71 | Temp 98.7°F | Ht 62.0 in | Wt 211.5 lb

## 2022-12-20 DIAGNOSIS — Z23 Encounter for immunization: Secondary | ICD-10-CM | POA: Diagnosis not present

## 2022-12-20 DIAGNOSIS — I10 Essential (primary) hypertension: Secondary | ICD-10-CM

## 2022-12-20 DIAGNOSIS — E119 Type 2 diabetes mellitus without complications: Secondary | ICD-10-CM | POA: Diagnosis not present

## 2022-12-20 DIAGNOSIS — Z7984 Long term (current) use of oral hypoglycemic drugs: Secondary | ICD-10-CM

## 2022-12-20 DIAGNOSIS — B3731 Acute candidiasis of vulva and vagina: Secondary | ICD-10-CM | POA: Diagnosis not present

## 2022-12-20 DIAGNOSIS — L292 Pruritus vulvae: Secondary | ICD-10-CM | POA: Diagnosis not present

## 2022-12-20 LAB — POCT GLYCOSYLATED HEMOGLOBIN (HGB A1C): Hemoglobin A1C: 6.5 % — AB (ref 4.0–5.6)

## 2022-12-20 LAB — GLUCOSE, CAPILLARY: Glucose-Capillary: 102 mg/dL — ABNORMAL HIGH (ref 70–99)

## 2022-12-20 MED ORDER — FLUCONAZOLE 150 MG PO TABS: 150.0000 mg | ORAL_TABLET | ORAL | 0 refills | Status: DC

## 2022-12-20 NOTE — Assessment & Plan Note (Addendum)
Last A1c about 7 months ago was 6.9.  Patient continues to be on empagliflozin 10 mg daily.  She reports adherence to these medications.  Denies any polyuria or polydipsia at this time.  Will repeat hemoglobin A1c today  -Hemoglobin A1c -Foot exam -Continue empagliflozin 10 mg daily

## 2022-12-20 NOTE — Assessment & Plan Note (Addendum)
Patient was recently treated for candidal vaginitis 2 months ago with 3 doses of fluconazole.  Presenting to the office today with similar symptoms, endorses vulvar itching and mild dysuria.  She is endorses mild whitish vaginal discharge.  She is on Jardiance 10 mg daily and I wonder if this medication is causing her recurrent vaginitis. I will consider treating this patient for a longer period of time and reassess his symptoms in 2 months.  If patient consistently continue to have vaginitis, I will consider switching her to a different class of antidiabetic medication and discussing the plan with her primary care physician Dr. Sol Blazing. -Fluconazole 150 mg orally weekly for 6 months - Cervicovaginal ancillary only

## 2022-12-20 NOTE — Patient Instructions (Addendum)
Thank you, [name] for allowing [name] to provide your care today. Today we discussed your general health and your current valvular itching. -I have sent for vaginal swab analysis.  I will call you to discuss the results once they are back -I am prescribing you at 6 months course of fluconazole 150 mg.  Please take 1 tablet every week, you received 24 doses from the pharmacy.  Please ensure to be consistent with the days that you take this medication. Example if you start the medication today, Wednesday, you will take the second dose same time next Wednesday until you complete all 24 doses in 6 months.  If your symptoms continue to persist please do not hesitate to call us. -We will also recheck your hemoglobin A1c today.  For your diabetes please continue taking Jardiance 10 mg until we say otherwise. -For your hypertension please continue taking losartan 10 mg daily.  I have ordered the following labs for you:  Lab Orders         Glucose, capillary         POC Hbg A1C      Tests ordered today:    Referrals ordered today:   Referral Orders  No referral(s) requested today     I have ordered the following medication/changed the following medications:   Stop the following medications: Medications Discontinued During This Encounter  Medication Reason   fluconazole (DIFLUCAN) 150 MG tablet    fluconazole (DIFLUCAN) 150 MG tablet      Start the following medications: Meds ordered this encounter  Medications   DISCONTD: fluconazole (DIFLUCAN) 150 MG tablet    Sig: Take 1 tablet (150 mg total) by mouth once a week.    Dispense:  6 tablet    Refill:  0   DISCONTD: fluconazole (DIFLUCAN) 150 MG tablet    Sig: Take 1 tablet (150 mg total) by mouth once a week.    Dispense:  24 tablet    Refill:  0   fluconazole (DIFLUCAN) 150 MG tablet    Sig: Take 1 tablet (150 mg total) by mouth once a week.    Dispense:  24 tablet    Refill:  0     Follow up: 3 months   Remember:    Should you have any questions or concerns please call the internal medicine clinic at [phone].    Kathleen Lime, M.D [facility]

## 2022-12-20 NOTE — Assessment & Plan Note (Signed)
History of hypertension.  Blood pressure in the office is at goal of 113/70.  Currently on losartan 25 mg -Continue same regimen -Appreciate and encourage her lifestyle modification

## 2022-12-20 NOTE — Progress Notes (Signed)
CC: Candida vaginitis  HPI:  Michelle Hurley is a 72 y.o. female living with a history stated below and presents today for Valvular itching and also to discuss other chronic conditions.. Please see problem based assessment and plan for additional details.  Past Medical History:  Diagnosis Date   Abdominal pain 05/22/2014   Allergic rhinitis 04/16/2015   Anxiety state, unspecified    ARTHRALGIA UNSPECIFIED SITE 12/04/2008   Qualifier: Diagnosis of  By: Sherene Sires MD, Charlaine Dalton    Arthritis    Atherosclerosis of abdominal aorta (HCC) 10/01/2015   Aortoiliac atherosclerosis noted on CT abdomen in 2016.   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 01/2005 Echo: EF 55-65%   Osteopenia    Other and unspecified hyperlipidemia    Palpitations    Plantar fasciitis, bilateral 06/12/2016   Plantar fasciitis, bilateral 06/12/2016   Rash 03/23/2017   Right ankle injury, initial encounter 06/12/2016   Right elbow pain 06/11/2017   Right leg pain 09/06/2016   Right optic neuritis 01/22/2018   Sciatica 01/15/2018   Shoulder pain, bilateral (Concern for CAD) 08/28/2018   Shoulder pain, bilateral (Concern for CAD) 08/28/2018   Umbilical hernia    Unspecified essential hypertension    Vaginal candidiasis 02/12/2018   VENTRICULAR TACHYCARDIA 12/04/2008   F/u  [facility]     Viral upper respiratory tract infection 01/15/2018   Vitamin D deficiency 02/12/2018    Current Outpatient Medications on  File Prior to Visit  Medication Sig Dispense Refill   acetaminophen (TYLENOL) 500 MG tablet Take 500 mg by mouth every 6 (six) hours as needed.     aspirin EC 81 MG tablet Take 81 mg by mouth daily. Swallow whole.     atorvastatin (LIPITOR) 80 MG tablet TAKE 1 TABLET EVERY DAY 90 tablet 3   calcium-vitamin D 250-100 MG-UNIT tablet Take 1 tablet by mouth 2 (two) times daily. 30 tablet 3   carvedilol (COREG) 25 MG tablet TAKE 1 TABLET TWICE DAILY WITH MEALS 180 tablet 3   diclofenac Sodium (VOLTAREN) 1 % GEL APPLY 2 GRAMS TOPICALLY 4 (FOUR) TIMES DAILY AS NEEDED. 100 g 2   ezetimibe (ZETIA) 10 MG tablet Take 1 tablet (10 mg total) by mouth daily. 90 tablet 3   furosemide (LASIX) 40 MG tablet TAKE 1 TABLET EVERY DAY 90 tablet 3   gabapentin (NEURONTIN) 300 MG capsule Take 1 capsule (300 mg total) by mouth 3 (three) times daily as needed. 90 capsule 5   Heating Pads (SUNBEAM STANDARD HEATING PAD) PADS Please use as directed on the instructions included. 1 each 0   JARDIANCE 10 MG TABS tablet TAKE 1 TABLET EVERY DAY 90 tablet 3   levocetirizine (XYZAL) 5 MG tablet TAKE 1 TABLET EVERY EVENING 90 tablet 1   losartan (COZAAR) 25 MG tablet Take 1 tablet (25 mg total) by mouth daily. 90 tablet 3   polyethylene glycol powder (GLYCOLAX/MIRALAX) powder Take 17 g by mouth  daily as needed for moderate constipation or severe constipation. 255 g 0   riTUXimab (RITUXAN) 500 MG/50ML injection INFUSE 1000MG  INTRAVENOUSLY ON DAY 1 & DAY 15 EVERY 6 MONTHS 200 mL 1   spironolactone (ALDACTONE) 25 MG tablet TAKE 1 TABLET EVERY DAY 90 tablet 3   verapamil (CALAN) 120 MG tablet TAKE 1/2 TABLET THREE TIMES DAILY 135 tablet 3   Zinc Oxide (DESITIN RAPID RELIEF) 13 % CREA Apply 1 application  topically daily. 454 g 0   No current facility-administered medications on file prior to visit.    Family History  Problem Relation Age of Onset   Heart disease Mother    Diabetes Mother    Lung cancer Mother    Hypertension  Brother    Heart attack Brother    Diabetes Sister    Breast cancer Sister    Diabetes Sister     Social History   Socioeconomic History   Marital status: Divorced    Spouse name: Not on file   Number of children: 2   Years of education: college   Highest education level: Not on file  Occupational History   Occupation: Retired  Tobacco Use   Smoking status: Never   Smokeless tobacco: Never  Vaping Use   Vaping status: Never Used  Substance and Sexual Activity   Alcohol use: No    Alcohol/week: 0.0 standard drinks of alcohol   Drug use: No   Sexual activity: Not on file  Other Topics Concern   Not on file  Social History Narrative   Current Social History 04/02/2020        Patient lives alone in an apartment which is 1 story. There are not steps up to the entrance.       Patient's method of transportation is personal car.      The highest level of education was some college.      The patient currently retired.      Identified important Relationships are "God and family"       Pets : None       Interests / Fun: Travel with her sisters, although this has stopped d/t Covid       Current Stressors: Right shoulder to lower-mid back pain X 3-4 months        Social Determinants of Health   Financial Resource Strain: Low Risk  (09/28/2022)   Overall Financial Resource Strain (CARDIA)    Difficulty of Paying Living Expenses: Not hard at all  Food Insecurity: No Food Insecurity (09/28/2022)   Hunger Vital Sign    Worried About Running Out of Food in the Last Year: Never true    Ran Out of Food in the Last Year: Never true  Transportation Needs: No Transportation Needs (09/28/2022)   PRAPARE - Administrator, Civil Service (Medical): No    Lack of Transportation (Non-Medical): No  Physical Activity: Inactive (09/28/2022)   Exercise Vital Sign    Days of Exercise per Week: 0 days    Minutes of Exercise per Session: 0 min  Stress: No Stress Concern Present  (09/28/2022)   [employer] of Occupational Health - Occupational Stress Questionnaire    Feeling of Stress : Not at all  Social Connections: Moderately Isolated (09/28/2022)   Social Connection and Isolation Panel [NHANES]    Frequency of Communication with Friends and Family: More than three times a week    Frequency of Social Gatherings with Friends and Family: More than three times  a week    Attends Religious Services: More than 4 times per year    Active Member of Clubs or Organizations: No    Attends Banker Meetings: Never    Marital Status: Divorced  Catering manager Violence: Not At Risk (09/28/2022)   Humiliation, Afraid, Rape, and Kick questionnaire    Fear of Current or Ex-Partner: No    Emotionally Abused: No    Physically Abused: No    Sexually Abused: No    Review of Systems: ROS negative except for what is noted on the assessment and plan.  Vitals:   12/20/22 1320  BP: 113/70  Pulse: 71  Temp: 98.7 F (37.1 C)  TempSrc: Oral  SpO2: 99%  Weight: 211 lb 8 oz (95.9 kg)  Height: 5\' 2"  (1.575 m)    Physical Exam: Constitutional: well-appearing woman, sitting in chair , in no acute distress Cardiovascular: regular rate and rhythm, no m/r/g Pulmonary/Chest: normal work of breathing on room air, lungs clear to auscultation bilaterally MSK: normal bulk and tone.  Foot exam unremarkable Neurological: alert & oriented x 3, no focal deficit Skin: warm and dry Psych: normal mood and behavior  Assessment & Plan:   Diabetes mellitus (HCC) Last A1c about 7 months ago was 6.9.  Patient continues to be on empagliflozin 10 mg daily.  She reports adherence to these medications.  Denies any polyuria or polydipsia at this time.  Will repeat hemoglobin A1c today  -Hemoglobin A1c -Foot exam -Continue empagliflozin 10 mg daily  Essential hypertension History of hypertension.  Blood pressure in the office is at goal of 113/70.  Currently on losartan 25  mg -Continue same regimen -Appreciate and encourage her lifestyle modification  Candida vaginitis Patient was recently treated for candidal vaginitis 2 months ago with 3 doses of fluconazole.  Presenting to the office today with similar symptoms, endorses vulvar itching and mild dysuria.  She is endorses mild whitish vaginal discharge.  She is on Jardiance 10 mg daily and I wonder if this medication is causing her recurrent vaginitis. I will consider treating this patient for a longer period of time and reassess his symptoms in 2 months.  If patient consistently continue to have vaginitis, I will consider switching her to a different class of antidiabetic medication and discussing the plan with her primary care physician Dr. Sol Blazing. -Fluconazole 150 mg orally weekly for 6 months - Cervicovaginal ancillary only  Patient seen with Dr. Lavonna Monarch, M.D [facility] Phone: [phone] Date 12/20/2022 Time 5:09 PM

## 2022-12-21 ENCOUNTER — Other Ambulatory Visit: Payer: Self-pay | Admitting: Student

## 2022-12-21 LAB — CERVICOVAGINAL ANCILLARY ONLY
Bacterial Vaginitis (gardnerella): NEGATIVE
Candida Glabrata: NEGATIVE
Candida Vaginitis: POSITIVE — AB
Comment: NEGATIVE
Comment: NEGATIVE
Comment: NEGATIVE
Comment: NEGATIVE
Trichomonas: NEGATIVE

## 2022-12-21 NOTE — Progress Notes (Signed)
Michelle Hurley is a 72 year old female with a history of type 2 diabetes on Jardiance who presented to the office on 12/20/2022 due to concerns for vulvar itching and whitish discharge.  Patient had previously presented with the same presentation 2 months ago and was prescribed 2 doses of fluconazole.  She returned to the clinic with similar presentation.  Suspected that her London Pepper is contributing to her urinary symptoms at this time.  Her vaginal swab on 12/20/2022  came back positive for Candida vaginitis.  We initially prescribed 6 months course of fluconazole to be taking weekly for her recurrent Candida vaginitis. To  confirm if she is having the side effects from her Jardiance versus evaluating other etiologies for her recurrent candidal vaginitis,the plan is to discontinue Jardiance at this time.  Her hemoglobin A1c is at goal of 6.5.  She will be switched back to 2 doses of fluconazole to be taking 3 days apart while discontinuing Jardiance.  Patient is advised to call the clinic on December 20 if his symptoms persist despite discontinuing Jardiance.  If patient symptoms resolves after 2 doses of fluconazole 150 mg and discontinuing Jardiance, patient will be recommended lifestyle modification and DASH diet to control her diabetes at that time.  If symptoms persist after stopping Jardiance and taking 2 doses of fluconazole, other etiologies of her recurrent Candida vaginitis will be considered.  Discussed with Dr. Sol Blazing.

## 2023-01-01 ENCOUNTER — Other Ambulatory Visit: Payer: Self-pay | Admitting: Internal Medicine

## 2023-01-05 NOTE — Progress Notes (Signed)
Internal Medicine Clinic Attending  I was physically present during the key portions of the resident provided service and participated in the medical decision making of patient's management care. I reviewed pertinent patient test results.  The assessment, diagnosis, and plan were formulated together and I agree with the documentation in the resident's note.   Concur with assessing response to llonger period of treatment for recurrent candida before discontinuing SGL2i.  Miguel Aschoff, MD

## 2023-01-15 DIAGNOSIS — H469 Unspecified optic neuritis: Secondary | ICD-10-CM | POA: Diagnosis not present

## 2023-01-29 DIAGNOSIS — H469 Unspecified optic neuritis: Secondary | ICD-10-CM | POA: Diagnosis not present

## 2023-02-05 ENCOUNTER — Other Ambulatory Visit: Payer: Self-pay

## 2023-02-05 DIAGNOSIS — E785 Hyperlipidemia, unspecified: Secondary | ICD-10-CM

## 2023-02-05 MED ORDER — EZETIMIBE 10 MG PO TABS: 10.0000 mg | ORAL_TABLET | Freq: Every day | ORAL | 3 refills | Status: DC

## 2023-02-05 MED ORDER — LOSARTAN POTASSIUM 25 MG PO TABS: 25.0000 mg | ORAL_TABLET | Freq: Every day | ORAL | 3 refills | Status: DC

## 2023-02-05 NOTE — Telephone Encounter (Deleted)
Medication sent to pharmacy

## 2023-02-11 ENCOUNTER — Other Ambulatory Visit: Payer: Self-pay | Admitting: Internal Medicine

## 2023-02-22 ENCOUNTER — Other Ambulatory Visit: Payer: Self-pay

## 2023-02-22 ENCOUNTER — Ambulatory Visit: Payer: Medicare HMO | Admitting: Dietician

## 2023-02-22 ENCOUNTER — Other Ambulatory Visit: Payer: Self-pay | Admitting: Student

## 2023-02-22 ENCOUNTER — Other Ambulatory Visit (HOSPITAL_COMMUNITY): Payer: Self-pay

## 2023-02-22 ENCOUNTER — Observation Stay (HOSPITAL_COMMUNITY)
Admission: EM | Admit: 2023-02-22 | Discharge: 2023-02-23 | Disposition: A | Discharge status: Home / Self Care | Payer: Medicare HMO | Attending: Internal Medicine | Admitting: Internal Medicine

## 2023-02-22 ENCOUNTER — Ambulatory Visit: Payer: Medicare HMO | Admitting: Student

## 2023-02-22 ENCOUNTER — Telehealth: Payer: Self-pay | Admitting: *Deleted

## 2023-02-22 VITALS — BP 105/67 | HR 73 | Temp 97.5°F | Ht 62.0 in | Wt 199.8 lb

## 2023-02-22 DIAGNOSIS — H538 Other visual disturbances: Secondary | ICD-10-CM | POA: Diagnosis not present

## 2023-02-22 DIAGNOSIS — R350 Frequency of micturition: Secondary | ICD-10-CM

## 2023-02-22 DIAGNOSIS — E1165 Type 2 diabetes mellitus with hyperglycemia: Secondary | ICD-10-CM

## 2023-02-22 DIAGNOSIS — E1110 Type 2 diabetes mellitus with ketoacidosis without coma: Secondary | ICD-10-CM

## 2023-02-22 DIAGNOSIS — R531 Weakness: Secondary | ICD-10-CM | POA: Diagnosis not present

## 2023-02-22 DIAGNOSIS — N183 Chronic kidney disease, stage 3 unspecified: Secondary | ICD-10-CM | POA: Diagnosis not present

## 2023-02-22 DIAGNOSIS — Z79899 Other long term (current) drug therapy: Secondary | ICD-10-CM | POA: Diagnosis not present

## 2023-02-22 DIAGNOSIS — Z794 Long term (current) use of insulin: Secondary | ICD-10-CM | POA: Diagnosis not present

## 2023-02-22 DIAGNOSIS — Z8673 Personal history of transient ischemic attack (TIA), and cerebral infarction without residual deficits: Secondary | ICD-10-CM | POA: Insufficient documentation

## 2023-02-22 DIAGNOSIS — E1122 Type 2 diabetes mellitus with diabetic chronic kidney disease: Secondary | ICD-10-CM

## 2023-02-22 DIAGNOSIS — N179 Acute kidney failure, unspecified: Secondary | ICD-10-CM | POA: Diagnosis not present

## 2023-02-22 DIAGNOSIS — R634 Abnormal weight loss: Secondary | ICD-10-CM | POA: Insufficient documentation

## 2023-02-22 DIAGNOSIS — E785 Hyperlipidemia, unspecified: Secondary | ICD-10-CM | POA: Diagnosis not present

## 2023-02-22 DIAGNOSIS — N1831 Chronic kidney disease, stage 3a: Secondary | ICD-10-CM | POA: Diagnosis not present

## 2023-02-22 DIAGNOSIS — R739 Hyperglycemia, unspecified: Secondary | ICD-10-CM | POA: Insufficient documentation

## 2023-02-22 DIAGNOSIS — Z9889 Other specified postprocedural states: Secondary | ICD-10-CM | POA: Diagnosis not present

## 2023-02-22 DIAGNOSIS — R5383 Other fatigue: Secondary | ICD-10-CM | POA: Diagnosis present

## 2023-02-22 DIAGNOSIS — I129 Hypertensive chronic kidney disease with stage 1 through stage 4 chronic kidney disease, or unspecified chronic kidney disease: Secondary | ICD-10-CM | POA: Diagnosis not present

## 2023-02-22 HISTORY — DX: Hyperglycemia, unspecified: R73.9

## 2023-02-22 HISTORY — DX: Type 2 diabetes mellitus with ketoacidosis without coma: E11.10

## 2023-02-22 LAB — BASIC METABOLIC PANEL
Anion gap: 20 — ABNORMAL HIGH (ref 5–15)
Anion gap: 16 — ABNORMAL HIGH (ref 5–15)
Anion gap: 15 (ref 5–15)
BUN: 40 mg/dL — ABNORMAL HIGH (ref 8–23)
BUN: 41 mg/dL — ABNORMAL HIGH (ref 8–23)
BUN: 38 mg/dL — ABNORMAL HIGH (ref 8–23)
CO2: 17 mmol/L — ABNORMAL LOW (ref 22–32)
CO2: 21 mmol/L — ABNORMAL LOW (ref 22–32)
CO2: 23 mmol/L (ref 22–32)
Calcium: 9.9 mg/dL (ref 8.9–10.3)
Calcium: 9.9 mg/dL (ref 8.9–10.3)
Calcium: 9.5 mg/dL (ref 8.9–10.3)
Chloride: 81 mmol/L — ABNORMAL LOW (ref 98–111)
Chloride: 80 mmol/L — ABNORMAL LOW (ref 98–111)
Chloride: 85 mmol/L — ABNORMAL LOW (ref 98–111)
Creatinine, Ser: 1.89 mg/dL — ABNORMAL HIGH (ref 0.44–1.00)
Creatinine, Ser: 1.94 mg/dL — ABNORMAL HIGH (ref 0.44–1.00)
Creatinine, Ser: 1.61 mg/dL — ABNORMAL HIGH (ref 0.44–1.00)
GFR, Estimated: 28 mL/min — ABNORMAL LOW (ref >60)
GFR, Estimated: 27 mL/min — ABNORMAL LOW (ref >60)
GFR, Estimated: 33 mL/min — ABNORMAL LOW (ref >60)
Glucose, Bld: 961 mg/dL (ref 70–99)
Glucose, Bld: 867 mg/dL (ref 70–99)
Glucose, Bld: 612 mg/dL (ref 70–99)
Potassium: 5.2 mmol/L — ABNORMAL HIGH (ref 3.5–5.1)
Potassium: 4.4 mmol/L (ref 3.5–5.1)
Potassium: 3.6 mmol/L (ref 3.5–5.1)
Sodium: 118 mmol/L — CL (ref 135–145)
Sodium: 117 mmol/L — CL (ref 135–145)
Sodium: 123 mmol/L — ABNORMAL LOW (ref 135–145)

## 2023-02-22 LAB — GLUCOSE, CAPILLARY
Glucose-Capillary: >600 mg/dL (ref 70–99)
Glucose-Capillary: >600 mg/dL (ref 70–99)
Glucose-Capillary: >600 mg/dL (ref 70–99)

## 2023-02-22 LAB — CBG MONITORING, ED
Glucose-Capillary: 304 mg/dL — ABNORMAL HIGH (ref 70–99)
Glucose-Capillary: 383 mg/dL — ABNORMAL HIGH (ref 70–99)
Glucose-Capillary: 409 mg/dL — ABNORMAL HIGH (ref 70–99)
Glucose-Capillary: 410 mg/dL — ABNORMAL HIGH (ref 70–99)
Glucose-Capillary: 458 mg/dL — ABNORMAL HIGH (ref 70–99)
Glucose-Capillary: 466 mg/dL — ABNORMAL HIGH (ref 70–99)
Glucose-Capillary: 512 mg/dL (ref 70–99)
Glucose-Capillary: >600 mg/dL (ref 70–99)
Glucose-Capillary: >600 mg/dL (ref 70–99)
Glucose-Capillary: >600 mg/dL (ref 70–99)
Glucose-Capillary: >600 mg/dL (ref 70–99)

## 2023-02-22 LAB — CBC
HCT: 40.3 % (ref 36.0–46.0)
Hemoglobin: 13.7 g/dL (ref 12.0–15.0)
MCH: 26.9 pg (ref 26.0–34.0)
MCHC: 34 g/dL (ref 30.0–36.0)
MCV: 79.2 fL — ABNORMAL LOW (ref 80.0–100.0)
Platelets: 257 10*3/uL (ref 150–400)
RBC: 5.09 MIL/uL (ref 3.87–5.11)
RDW: 13.5 % (ref 11.5–15.5)
WBC: 4.6 10*3/uL (ref 4.0–10.5)
nRBC: 0 % (ref 0.0–0.2)

## 2023-02-22 LAB — CBC WITH DIFFERENTIAL/PLATELET
Abs Immature Granulocytes: 0.01 10*3/uL (ref 0.00–0.07)
Basophils Absolute: 0 10*3/uL (ref 0.0–0.1)
Basophils Relative: 1 %
Eosinophils Absolute: 0 10*3/uL (ref 0.0–0.5)
Eosinophils Relative: 0 %
HCT: 41.2 % (ref 36.0–46.0)
Hemoglobin: 13.8 g/dL (ref 12.0–15.0)
Immature Granulocytes: 0 %
Lymphocytes Relative: 23 %
Lymphs Abs: 1 10*3/uL (ref 0.7–4.0)
MCH: 26.7 pg (ref 26.0–34.0)
MCHC: 33.5 g/dL (ref 30.0–36.0)
MCV: 79.7 fL — ABNORMAL LOW (ref 80.0–100.0)
Monocytes Absolute: 0.3 10*3/uL (ref 0.1–1.0)
Monocytes Relative: 7 %
Neutro Abs: 3 10*3/uL (ref 1.7–7.7)
Neutrophils Relative %: 69 %
Platelets: 264 10*3/uL (ref 150–400)
RBC: 5.17 MIL/uL — ABNORMAL HIGH (ref 3.87–5.11)
RDW: 13.5 % (ref 11.5–15.5)
WBC: 4.4 10*3/uL (ref 4.0–10.5)
nRBC: 0 % (ref 0.0–0.2)

## 2023-02-22 LAB — URINALYSIS, ROUTINE W REFLEX MICROSCOPIC
Bilirubin Urine: NEGATIVE
Glucose, UA: >=500 mg/dL — AB
Hgb urine dipstick: NEGATIVE
Ketones, ur: 5 mg/dL — AB
Leukocytes,Ua: NEGATIVE
Nitrite: NEGATIVE
Protein, ur: NEGATIVE mg/dL
Specific Gravity, Urine: 1.018 (ref 1.005–1.030)
pH: 5 (ref 5.0–8.0)

## 2023-02-22 LAB — BETA-HYDROXYBUTYRIC ACID: Beta-Hydroxybutyric Acid: 3.16 mmol/L — ABNORMAL HIGH (ref 0.05–0.27)

## 2023-02-22 LAB — POCT I-STAT EG7
Acid-base deficit: 4 mmol/L — ABNORMAL HIGH (ref 0.0–2.0)
Bicarbonate: 20.8 mmol/L (ref 20.0–28.0)
Calcium, Ion: 1.13 mmol/L — ABNORMAL LOW (ref 1.15–1.40)
HCT: 46 % (ref 36.0–46.0)
Hemoglobin: 15.6 g/dL — ABNORMAL HIGH (ref 12.0–15.0)
O2 Saturation: 53 %
Potassium: 4.3 mmol/L (ref 3.5–5.1)
Sodium: 115 mmol/L — CL (ref 135–145)
TCO2: 22 mmol/L (ref 22–32)
pCO2, Ven: 37.4 mm[Hg] — ABNORMAL LOW (ref 44–60)
pH, Ven: 7.353 (ref 7.25–7.43)
pO2, Ven: 29 mm[Hg] — CL (ref 32–45)

## 2023-02-22 LAB — TSH: TSH: 0.858 u[IU]/mL (ref 0.350–4.500)

## 2023-02-22 LAB — TROPONIN I (HIGH SENSITIVITY): Troponin I (High Sensitivity): 8 ng/L (ref <18)

## 2023-02-22 MED ORDER — LACTATED RINGERS IV BOLUS: 1000.0000 mL | Freq: Once | INTRAVENOUS | Status: DC

## 2023-02-22 MED ORDER — INSULIN GLARGINE 100 UNIT/ML ~~LOC~~ SOLN
15.0000 [IU] | Freq: Once | SUBCUTANEOUS | Status: AC
  Administered 2023-02-22: 15 [IU] via SUBCUTANEOUS

## 2023-02-22 MED ORDER — POTASSIUM CHLORIDE 10 MEQ/100ML IV SOLN
10.0000 meq | INTRAVENOUS | Status: AC
  Administered 2023-02-22 (×2): 10 meq via INTRAVENOUS
  Filled 2023-02-22: qty 100

## 2023-02-22 MED ORDER — DEXTROSE IN LACTATED RINGERS 5 % IV SOLN
INTRAVENOUS | Status: DC
  Filled 2023-02-22 (×2): qty 1000

## 2023-02-22 MED ORDER — INSULIN REGULAR(HUMAN) IN NACL 100-0.9 UT/100ML-% IV SOLN
INTRAVENOUS | Status: DC
  Administered 2023-02-22: 11 [IU]/h via INTRAVENOUS
  Administered 2023-02-22: 11.5 [IU]/h via INTRAVENOUS
  Filled 2023-02-22 (×3): qty 100

## 2023-02-22 MED ORDER — SITAGLIPTIN PHOSPHATE 100 MG PO TABS
100.0000 mg | ORAL_TABLET | Freq: Every day | ORAL | 1 refills | Status: DC
Start: 2023-02-22 — End: 2023-02-22

## 2023-02-22 MED ORDER — INSULIN ASPART 100 UNIT/ML IJ SOLN
10.0000 [IU] | Freq: Once | INTRAMUSCULAR | Status: AC
  Administered 2023-02-22: 10 [IU] via SUBCUTANEOUS

## 2023-02-22 MED ORDER — DEXTROSE 50 % IV SOLN: 0.0000 mL | INTRAVENOUS | Status: DC | PRN

## 2023-02-22 MED ORDER — SODIUM CHLORIDE 0.9 % IV SOLN: 1.0000 g | Freq: Once | INTRAVENOUS | Status: DC

## 2023-02-22 MED ORDER — LACTATED RINGERS IV BOLUS
20.0000 mL/kg | Freq: Once | INTRAVENOUS | Status: AC
  Administered 2023-02-22: 1812 mL via INTRAVENOUS

## 2023-02-22 MED ORDER — NITROFURANTOIN MONOHYD MACRO 100 MG PO CAPS: 100.0000 mg | ORAL_CAPSULE | Freq: Two times a day (BID) | ORAL | 0 refills | Status: DC

## 2023-02-22 MED ORDER — INSULIN GLARGINE 100 UNIT/ML SOLOSTAR PEN
15.0000 [IU] | PEN_INJECTOR | Freq: Every day | SUBCUTANEOUS | 11 refills | Status: DC
  Filled 2023-02-22: qty 15, 100d supply, fill #0

## 2023-02-22 MED ORDER — LACTATED RINGERS IV SOLN: INTRAVENOUS | Status: DC

## 2023-02-22 MED ORDER — RIVAROXABAN 10 MG PO TABS
10.0000 mg | ORAL_TABLET | Freq: Every day | ORAL | Status: DC
  Filled 2023-02-22 (×2): qty 1

## 2023-02-22 MED ORDER — GLIPIZIDE ER 2.5 MG PO TB24: 2.5000 mg | ORAL_TABLET | Freq: Every day | ORAL | 0 refills | Status: DC

## 2023-02-22 NOTE — Assessment & Plan Note (Deleted)
She reports with 1 week of generalized weakness, which has been progressive.  She does not notice any specific areas of weakness but overall feels fatigued and less able to participate in her daily activities.  She also notes increased polyuria and polydipsia over the course of the past week.  She has a history of recurrent yeast infections for which she takes fluconazole.  She recently stopped her Jardiance.  She denies any other symptoms including syncope, chest pain, diarrhea, fevers, hematuria, nausea or vomiting.   I think the most likely cause of her increased urinary frequency and generalized weakness is a urinary tract infection or hyperglycemia given her diabetes.  We will collect a CBC, BMP, TSH, urinalysis and urine dipstick, and point-of-care blood glucose today.  Her last urinary tract infection was susceptible to Macrobid, so I will start her on Macrobid 100 mg twice daily for the next 5 days.  I have given her return precautions in case her symptoms do not improve with the antibiotic.

## 2023-02-22 NOTE — Progress Notes (Signed)
Lab Results  Component Value Date   HGBA1C 6.5 (A) 12/20/2022   HGBA1C 6.9 (A) 05/01/2022   HGBA1C 7.3 (A) 02/03/2022   HGBA1C 6.8 (A) 08/22/2021   HGBA1C 6.8 (A) 05/11/2021    Diabetes Self-Management Education  Visit Type: First/Initial (asked to see pt for insulin instruction. blood glucose is >900 so kept education simple. she repeated demo how to  use an insulin pen with no problems. [place] in [place] and says she several sisters live near her and know how to do injections.)  Appt. Start Time: 1145 Appt. End Time: 1215  02/22/2023  Michelle Hurley, identified by name and date of birth, is a 73 y.o. female with a diagnosis of Diabetes: Type 2.   ASSESSMENT  Last menstrual period 04/05/2012. There is no height or weight on file to calculate BMI.   Diabetes Self-Management Education - 02/22/23 1400       Visit Information   Visit Type First/Initial   asked to see pt for insulin instruction. blood glucose is >900 so kept education simple. she repeated demo how to  use an insulin pen with no problems. her sister is in [place] and says she several sisters live near her and know how to do injections.     Initial Visit   Diabetes Type Type 2    Date Diagnosed 2023    Are you currently following a meal plan? --   deferred this until follow up   Are you taking your medications as prescribed? Yes      Health Coping   How would you rate your overall health? --   deferred this until follow up     Psychosocial Assessment   Patient Belief/Attitude about Diabetes Motivated to manage diabetes    What is the hardest part about your diabetes right now, causing you the most concern, or is the most worrisome to you about your diabetes?   --   deferred this until follow up   Self-care barriers Debilitated state due to current medical condition    Self-management support Family    Patient Concerns Glycemic Control;Medication    Special Needs Simplified materials    Preferred  Learning Style Hands on    Learning Readiness Ready    How often do you need to have someone help you when you read instructions, pamphlets, or other written materials from your doctor or pharmacy? 3 - Sometimes    What is the last grade level you completed in school? 12      Pre-Education Assessment   Patient understands the diabetes disease and treatment process. --   deferred this and all other topics except insulin instruction eduastion until follow up   Patient understands using medications safely. Needs Instruction      Complications   Last HgB A1C per patient/outside source 6.5 %    How often do you check your blood sugar? 0 times/day (not testing)    Have you had a dilated eye exam in the past 12 months? Yes    Have you had a dental exam in the past 12 months? Yes    Are you checking your feet? No      Dietary Intake   Breakfast deferred this until follow up      Activity / Exercise   Activity / Exercise Type --   deferred this until follow up     Patient Education   Previous Diabetes Education --   deferred this until follow  up   Medications Taught/reviewed insulin/injectables, injection, site rotation, insulin/injectables storage and needle disposal.;Reviewed patients medication for diabetes, action, purpose, timing of dose and side effects.      Individualized Goals (developed by patient)   Medications take my medication as prescribed      Post-Education Assessment   Patient understands using medications safely. Comphrehends key points      Outcomes   Expected Outcomes Demonstrated interest in learning but significant barriers to change    Future DMSE 2 wks    Program Status Not Completed             Individualized Plan for Diabetes Self-Management Training:   Learning Objective:  Patient will have a greater understanding of diabetes self-management. Patient education plan is to attend individual and/or group sessions per assessed needs and concerns.   Plan:    There are no Patient Instructions on file for this visit.  Expected Outcomes:  Demonstrated interest in learning but significant barriers to change  Education material provided: Diabetes Resources  If problems or questions, patient to contact team via:  Phone  Future DSME appointment: 2 wks Norm Parcel, RD 02/22/2023 2:16 PM.

## 2023-02-22 NOTE — Addendum Note (Signed)
Addended by: Annett Fabian on: 02/22/2023 05:11 PM   Modules accepted: Level of Service

## 2023-02-22 NOTE — Hospital Course (Addendum)
  Diabetic ketoacidosis T2DM Patient was seen in [facility] clinic for lethargy, found to have blood glucose >900 and anion gap of 20. She was sent to the ED, where she was started on Endotool and LR infusion. Her anion gap closed overnight and was tolerating po intake. On hospital day 1 she was transitioned to 10 units subcutaneous insulin. Patient was discharged with Glargine 10 units daily and blood sugar monitoring supplies. Diabetes educator saw patient prior to discharge.   Overall, this patient's presentation is unusual with previously well controlled T2DM. Last A1c was 6.5 in December 2025. At that time her London Pepper was discontinued due to recurrent yeast infections. She does endorse similar symptoms of a yeast infection during the past two weeks, which may have triggered her hyperglycemia. Additionally, she reports a decreased appetite and has had unintentional weight loss of approximately 10 pounds in the last two months. This may be due to profound polyuria and water weight loss. She will need close outpatient follow up to evaluate weight loss and any other recurrent symptoms  Blurry vision Patient reports onset of blurry vision in the last two weeks. She has a history of neuromyelitis optica and receives Rituxin infusions. Her fatigue, polyuria, and polydipsia had resolved with treatment of her DKA, however her blurry vision persisted. She will need ophthalmology follow up outpatient.   Weight loss Patient has had unintentional weight loss of ~10lbs in the past two months. This may be due to water weight loss from polyuria, however she will need further outpatient follow up to see if her weight returns to baseline with resolution of DKA.   AKI on CKD3 Creatinine on admission 1.94 from baseline ~1.2. She received an LR infusion at 159mL/hr for treatment of DKA, by hospital day 1 patient's creatinine had returned to baseline.   HTN: antihypertensives held HLD: home statin held Hx TIA: home ASA  held

## 2023-02-22 NOTE — ED Notes (Signed)
Patient left my assignment, AOX4, in stable condition, with her belongings, family and staff.

## 2023-02-22 NOTE — ED Triage Notes (Signed)
Pt to ED via POV with sister from PCP after sent pt in for BG of 961. Pt has been feeling weak for 2 weeks, loss of appetite, increased thirst & increased urination.

## 2023-02-22 NOTE — H&P (Cosign Needed)
Date: 02/22/2023               Patient Name:  Michelle Hurley MRN: [id number]  DOB: 1950/05/06 Age / Sex: 73 y.o., female   PCP: Dickie La, MD         Medical Service: Internal Medicine Teaching Service         Attending Physician: Dr. Mercie Eon, MD      First Contact: Dr. Monna Fam, MD Pager [phone]    Second Contact: Dr. Rudene Christians, DO Pager [phone]         After Hours (After 5p/  First Contact Pager: [phone]  weekends / holidays): Second Contact Pager: [phone]   SUBJECTIVE   Chief Complaint: lethargy  History of Present Illness:  Patient is a 73 year old woman with history of T2DM, HTN, presenting to the ED from [facility] clinic with lethargy and hyperglycemia. Patient reports 2 weeks of generalized fatigue, blurry vision, polyuria and polydypsia. She has had a decreased appetite but no nausea or vomiting. She recently stopped Jardiance in December due to multiple yeast infections, currently is not on any antihyperglycemic agents. Denies fever, chest pain, diarrhea, dysuria.   In the clinic labs showed hyperglycemia to 960, anion gap of 20. Patient was given 10 units of aspart and 15 units Lantus, then sent to the ED for further evaluation.   ED Course: Afeb, VSS, ORA Na 118 (corrected 132)  K 5.2 Gluc >600, Anion gap 20 VBG: ph 7.35, pco2 37.4 Started insulin infusion, Kcl D5 -> LR infusion  Past Medical History: Past Medical History:  Diagnosis Date   Abdominal pain 05/22/2014   Allergic rhinitis 04/16/2015   Anxiety state, unspecified    ARTHRALGIA UNSPECIFIED SITE 12/04/2008   Qualifier: Diagnosis of  By: Sherene Sires MD, Charlaine Dalton    Arthritis    Atherosclerosis of abdominal aorta (HCC) 10/01/2015   Aortoiliac atherosclerosis noted on CT abdomen in 2016.   Atypical chest pain 12/11/2018   Cough with congestion of paranasal sinus 06/01/2015   Dizziness 04/06/2012   Essential hypertension 12/24/2006   Estrogen deficiency 11/16/2016   GERD (gastroesophageal reflux  disease) 08/07/2018   H/O: hysterectomy    Hx-TIA (transient ischemic attack) 04/06/2012   Hypertension    Hypokalemia 02/12/2018   Influenza-like illness 02/02/2016   Loss of part of visual field    Morbid obesity (HCC)    Myopia    Nausea with vomiting 05/23/2014   Neuromyelitis optica (HCC) 01/22/2018   NSVT (nonsustained ventricular tachycardia) (HCC)    a. 01/2005 Echo: EF 55-65%   Osteopenia    Other and unspecified hyperlipidemia    Palpitations    Plantar fasciitis, bilateral 06/12/2016   Plantar fasciitis, bilateral 06/12/2016   Rash 03/23/2017   Right ankle injury, initial encounter 06/12/2016   Right elbow pain 06/11/2017   Right leg pain 09/06/2016   Right optic neuritis 01/22/2018   Sciatica 01/15/2018   Shoulder pain, bilateral (Concern for CAD) 08/28/2018   Shoulder pain, bilateral (Concern for CAD) 08/28/2018   Umbilical hernia    Unspecified essential hypertension    Vaginal candidiasis 02/12/2018   VENTRICULAR TACHYCARDIA 12/04/2008   F/u  [facility]rein     Viral upper respiratory tract infection 01/15/2018   Vitamin D deficiency 02/12/2018    Meds:  Current Outpatient Medications  Medication Instructions   acetaminophen (TYLENOL) 500 mg, Oral, Every 6 hours PRN   aspirin EC 81 mg, Oral, Daily, Swallow whole.   atorvastatin (LIPITOR) 80 mg, Oral, Daily  calcium-vitamin D 250-100 MG-UNIT tablet 1 tablet, Oral, 2 times daily   carvedilol (COREG) 25 MG tablet TAKE 1 TABLET TWICE DAILY WITH MEALS   diclofenac Sodium (VOLTAREN) 1 % GEL APPLY 2 GRAMS TOPICALLY 4 (FOUR) TIMES DAILY AS NEEDED.   DULoxetine (CYMBALTA) 60 mg, Oral, Daily   ezetimibe (ZETIA) 10 mg, Oral, Daily   fluconazole (DIFLUCAN) 150 mg, Oral, Weekly   furosemide (LASIX) 40 mg, Oral, Daily   gabapentin (NEURONTIN) 300 mg, Oral, 3 times daily PRN   Heating Pads (SUNBEAM STANDARD HEATING PAD) PADS Please use as directed on the instructions included.   Jardiance 10 mg, Oral, Daily   Lantus SoloStar 15 Units,  Subcutaneous, Daily   levocetirizine (XYZAL) 5 MG tablet TAKE 1 TABLET EVERY EVENING   losartan (COZAAR) 25 mg, Oral, Daily   polyethylene glycol powder (GLYCOLAX/MIRALAX) 17 g, Oral, Daily PRN   riTUXimab (RITUXAN) 500 MG/50ML injection INFUSE 1000MG  INTRAVENOUSLY ON DAY 1 & DAY 15 EVERY 6 MONTHS   spironolactone (ALDACTONE) 25 MG tablet TAKE 1 TABLET EVERY DAY   verapamil (CALAN) 120 MG tablet TAKE 1/2 TABLET THREE TIMES DAILY   Zinc Oxide (DESITIN RAPID RELIEF) 13 % CREA 1 application , Apply externally, Daily    Past Surgical History:  Procedure Laterality Date   ABDOMINAL HYSTERECTOMY  01/10/1972   BREAST BIOPSY Left 04/14/2019   BREAST BIOPSY Left 10/31/2021   BREAST BIOPSY Left 11/20/2022   MM LT BREAST BX W LOC DEV 1ST LESION IMAGE BX SPEC STEREO GUIDE 11/20/2022 [facility]   ESOPHAGOGASTRODUODENOSCOPY N/A 05/28/2014   Procedure: ESOPHAGOGASTRODUODENOSCOPY (EGD);  Surgeon: Dorena Cookey, MD;  Location: [facility];  Service: Endoscopy;  Laterality: N/A;   LAPAROSCOPIC SMALL BOWEL RESECTION N/A 06/04/2014   Procedure: LAPAROSCOPIC SMALL BOWEL RESECTION;  Surgeon: Claud Kelp, MD;  Location: [facility];  Service: General;  Laterality: N/A;   LAPAROSCOPIC TRANSABDOMINAL HERNIA N/A 05/03/2014   Procedure: LAPAROSCOPIC EXPLORATION WITH OPEN REPAIR OF INCARCERATED EPIGASTRIC HERNIA ;  Surgeon: Ovidio Kin, MD;  Location: [facility];  Service: General;  Laterality: N/A;   Social:  Living Situation: home alone. Independent in ADLs/iADLs PCP: Dickie La, MD Tobacco: None Alcohol: None Drugs: None  Family History: noncontributory  Allergies: Allergies as of 02/22/2023 - Review Complete 02/22/2023  Allergen Reaction Noted   Mushroom extract complex (obsolete) Anaphylaxis, Itching, and Swelling 05/03/2014   Codeine Nausea And Vomiting 05/03/2014   Hydrocodone Nausea And Vomiting 05/05/2014   Prednisone Nausea And Vomiting 12/20/2010   Omeprazole Itching 01/29/2019   Review of  Systems: A complete ROS was negative except as per HPI.   OBJECTIVE:   Physical Exam: Blood pressure 100/70, pulse 75, temperature 97.8 F (36.6 C), temperature source Oral, resp. rate 18, last menstrual period 04/05/2012, SpO2 100%.  Constitutional: well appearing, in no acute distress Cardiovascular: RRR, no murmurs Pulmonary/Chest: CTAB Abdominal: soft, nontender, nondistended Neurological: A&O x 3  Labs: CBC    Component Value Date/Time   WBC 4.4 02/22/2023 1418   RBC 5.17 (H) 02/22/2023 1418   HGB 15.6 (H) 02/22/2023 1419   HGB 14.0 11/13/2022 1501   HCT 46.0 02/22/2023 1419   HCT 43.1 11/13/2022 1501   PLT 264 02/22/2023 1418   PLT 261 11/13/2022 1501   MCV 79.7 (L) 02/22/2023 1418   MCV 85 11/13/2022 1501   MCH 26.7 02/22/2023 1418   MCHC 33.5 02/22/2023 1418   RDW 13.5 02/22/2023 1418   RDW 14.0 11/13/2022 1501   LYMPHSABS 1.0 02/22/2023 1418   LYMPHSABS  1.3 11/13/2022 1501   MONOABS 0.3 02/22/2023 1418   EOSABS 0.0 02/22/2023 1418   EOSABS 0.1 11/13/2022 1501   BASOSABS 0.0 02/22/2023 1418   BASOSABS 0.0 11/13/2022 1501    CMP     Component Value Date/Time   NA 115 (LL) 02/22/2023 1419   NA 139 06/06/2022 1646   K 4.3 02/22/2023 1419   CL 80 (L) 02/22/2023 1418   CO2 21 (L) 02/22/2023 1418   GLUCOSE 867 (HH) 02/22/2023 1418   BUN 41 (H) 02/22/2023 1418   BUN 19 06/06/2022 1646   CREATININE 1.94 (H) 02/22/2023 1418   CREATININE 1.03 (H) 11/19/2014 1131   CALCIUM 9.9 02/22/2023 1418   PROT 6.5 02/09/2021 1526   ALBUMIN 4.1 02/09/2021 1526   AST 15 02/09/2021 1526   ALT 24 02/09/2021 1526   ALKPHOS 68 02/09/2021 1526   BILITOT <0.2 02/09/2021 1526   GFRNONAA 27 (L) 02/22/2023 1418   GFRNONAA 57 (L) 11/19/2014 1131   GFRAA 50 (L) 02/25/2020 1005   GFRAA 65 11/19/2014 1131   Imaging: No results found.  ASSESSMENT & PLAN:   Meleni Delahunt is a 73 y.o. person living with a history of T2DM, HTN who presented with lethargy and admitted for DKA on  hospital day 0  Diabetic ketoacidosis T2DM Unsure of etiology of patient's sudden decline into DKA. Last A1c 6.5. Not currently on antiglycemic agents. She has never been hospitalized for DKA before. Denies any other recent changes in medication or diet. Denies drug use, pregnancy. No abdominal pain indicative of pancreatitis.  - Endotool, goal range 140-180 - D5 in LR at 161mL/hr for 1 day - BMP q4h  AKI on CKD3 Cr 1.94 from baseline ~ 1.2. Likely prerenal in setting of decreased po intake.  - D5 in LR at 153mL/hr for 1 day - Trend BMP  HTN BP within normal limits, holding home antihypertensives. Will reassess tomorrow.   HLD: holding home statin and zetia while npo Hx TIA: holding home ASA while npo  Diet: NPO VTE: Xarelto Code: Full  Prior to Admission Living Arrangement: Home Anticipated Discharge Location: pending PT/OT eval Barriers to Discharge: pending medical stability  Signed: Monna Fam, MD Internal Medicine Resident PGY-1  02/22/2023, 4:51 PM

## 2023-02-22 NOTE — ED Notes (Signed)
Just assumed care of patient. Patient laying in bed with daughter at bedside. Patient is A&O times 4. Patient is stable on full cardiac monitor with pulse ox. Patient is in no noted distress at the present time will continue to monitor for any changes.

## 2023-02-22 NOTE — Progress Notes (Signed)
Internal Medicine Clinic Attending  Case discussed with the resident at the time of the visit.  We reviewed the resident's history and exam and pertinent patient test results.  I agree with the assessment, diagnosis, and plan of care documented in the resident's note.   Patient here with severe symptomatic hyperglycemia and AKI; we gave short and long acting sub Q insulin; attempted IV access unsuccessfully. Called IV team to consult and requested direct admission. Unfortunately there were no beds available. IV consulted canceled; Patient was sent to the ED for further management.

## 2023-02-22 NOTE — Progress Notes (Signed)
CC: Weakness  HPI:  [name] is a 73 y.o. female living with a history stated below and presents today for generalized weakness. Please see problem based assessment and plan for additional details.  Past Medical History:  Diagnosis Date   Abdominal pain 05/22/2014   Allergic rhinitis 04/16/2015   Anxiety state, unspecified    ARTHRALGIA UNSPECIFIED SITE 12/04/2008   Qualifier: Diagnosis of  By: Sherene Sires MD, Charlaine Dalton    Arthritis    Atherosclerosis of abdominal aorta (HCC) 10/01/2015   Aortoiliac atherosclerosis noted on CT abdomen in 2016.   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 01/2005 Echo: EF 55-65%   Osteopenia    Other and unspecified hyperlipidemia    Palpitations    Plantar fasciitis, bilateral 06/12/2016   Plantar fasciitis, bilateral 06/12/2016   Rash 03/23/2017   Right ankle injury, initial encounter 06/12/2016   Right elbow pain 06/11/2017   Right leg pain 09/06/2016   Right optic neuritis 01/22/2018   Sciatica 01/15/2018   Shoulder pain, bilateral (Concern for CAD) 08/28/2018   Shoulder pain, bilateral (Concern for CAD) 08/28/2018   Umbilical hernia    Unspecified essential hypertension    Vaginal candidiasis 02/12/2018   VENTRICULAR TACHYCARDIA 12/04/2008   F/u  [facility] Hochrein     Viral upper respiratory tract infection 01/15/2018   Vitamin D deficiency 02/12/2018    Current Outpatient Medications on File Prior to Visit  Medication Sig Dispense  Refill   acetaminophen (TYLENOL) 500 MG tablet Take 500 mg by mouth every 6 (six) hours as needed.     aspirin EC 81 MG tablet Take 81 mg by mouth daily. Swallow whole.     atorvastatin (LIPITOR) 80 MG tablet TAKE 1 TABLET EVERY DAY 90 tablet 3   calcium-vitamin D 250-100 MG-UNIT tablet Take 1 tablet by mouth 2 (two) times daily. 30 tablet 3   carvedilol (COREG) 25 MG tablet TAKE 1 TABLET TWICE DAILY WITH MEALS 180 tablet 3   diclofenac Sodium (VOLTAREN) 1 % GEL APPLY 2 GRAMS TOPICALLY 4 (FOUR) TIMES DAILY AS NEEDED. 100 g 2   DULoxetine (CYMBALTA) 60 MG capsule TAKE 1 CAPSULE EVERY DAY 90 capsule 0   empagliflozin (JARDIANCE) 10 MG TABS tablet TAKE 1 TABLET EVERY DAY 90 tablet 0   ezetimibe (ZETIA) 10 MG tablet Take 1 tablet (10 mg total) by mouth daily. 90 tablet 3   fluconazole (DIFLUCAN) 150 MG tablet Take 1 tablet (150 mg total) by mouth once a week. 24 tablet 0   furosemide (LASIX) 40 MG tablet TAKE 1 TABLET EVERY DAY 90 tablet 3   gabapentin (NEURONTIN) 300 MG capsule Take 1 capsule (300 mg total) by mouth 3 (three) times daily as needed. 90 capsule 5   Heating Pads (SUNBEAM STANDARD HEATING PAD) PADS Please use as directed on the instructions included. 1 each 0   levocetirizine (XYZAL) 5 MG tablet TAKE 1 TABLET EVERY EVENING 90 tablet 1  losartan (COZAAR) 25 MG tablet Take 1 tablet (25 mg total) by mouth daily. 90 tablet 3   polyethylene glycol powder (GLYCOLAX/MIRALAX) powder Take 17 g by mouth daily as needed for moderate constipation or severe constipation. 255 g 0   riTUXimab (RITUXAN) 500 MG/50ML injection INFUSE 1000MG  INTRAVENOUSLY ON DAY 1 & DAY 15 EVERY 6 MONTHS 200 mL 1   spironolactone (ALDACTONE) 25 MG tablet TAKE 1 TABLET EVERY DAY 90 tablet 3   verapamil (CALAN) 120 MG tablet TAKE 1/2 TABLET THREE TIMES DAILY 135 tablet 3   Zinc Oxide (DESITIN RAPID RELIEF) 13 % CREA Apply 1 application  topically daily. 454 g 0   No current facility-administered medications on file prior  to visit.    Family History  Problem Relation Age of Onset   Heart disease Mother    Diabetes Mother    Lung cancer Mother    Hypertension Brother    Heart attack Brother    Diabetes Sister    Breast cancer Sister    Diabetes Sister     Social History   Socioeconomic History   Marital status: Divorced    Spouse name: Not on file   Number of children: 2   Years of education: college   Highest education level: Not on file  Occupational History   Occupation: Retired  Tobacco Use   Smoking status: Never   Smokeless tobacco: Never  Vaping Use   Vaping status: Never Used  Substance and Sexual Activity   Alcohol use: No    Alcohol/week: 0.0 standard drinks of alcohol   Drug use: No   Sexual activity: Not on file  Other Topics Concern   Not on file  Social History Narrative   Current Social History 04/02/2020        Patient lives alone in an apartment which is 1 story. There are not steps up to the entrance.       Patient's method of transportation is personal car.      The highest level of education was some college.      The patient currently retired.      Identified important Relationships are "God and family"       Pets : None       Interests / Fun: Travel with her sisters, although this has stopped d/t Covid       Current Stressors: Right shoulder to lower-mid back pain X 3-4 months        Social Drivers of Corporate investment banker Strain: Low Risk  (09/28/2022)   Overall Financial Resource Strain (CARDIA)    Difficulty of Paying Living Expenses: Not hard at all  Food Insecurity: No Food Insecurity (09/28/2022)   Hunger Vital Sign    Worried About Running Out of Food in the Last Year: Never true    Ran Out of Food in the Last Year: Never true  Transportation Needs: No Transportation Needs (09/28/2022)   PRAPARE - Administrator, Civil Service (Medical): No    Lack of Transportation (Non-Medical): No  Physical Activity: Inactive (09/28/2022)    Exercise Vital Sign    Days of Exercise per Week: 0 days    Minutes of Exercise per Session: 0 min  Stress: No Stress Concern Present (09/28/2022)   [employer] of Occupational Health - Occupational Stress Questionnaire    Feeling of Stress : Not at all  Social Connections: Moderately Isolated (09/28/2022)   Social Connection and Isolation Panel [NHANES]  Frequency of Communication with Friends and Family: More than three times a week    Frequency of Social Gatherings with Friends and Family: More than three times a week    Attends Religious Services: More than 4 times per year    Active Member of [employer] or Organizations: No    Attends Banker Meetings: Never    Marital Status: Divorced  Catering manager Violence: Not At Risk (09/28/2022)   Humiliation, Afraid, Rape, and Kick questionnaire    Fear of Current or Ex-Partner: No    Emotionally Abused: No    Physically Abused: No    Sexually Abused: No    Review of Systems: ROS negative except for what is noted on the assessment and plan.  Vitals:   02/22/23 0956  BP: 105/67  Pulse: 73  Temp: (!) 97.5 F (36.4 C)  TempSrc: Oral  SpO2: 95%  Weight: 199 lb 12.8 oz (90.6 kg)  Height: 5\' 2"  (1.575 m)   Physical Exam: She appears fatigued and on exam today.  Family member is bedside. Regular rate and rhythm, no murmurs appreciated Lungs clear to auscultation bilaterally, normal inspiratory and expiratory effort Abdomen soft, nontender, nondistended No lower extremity edema No focal neurological deficits appreciated, alert and oriented x 4  Assessment & Plan:   Patient discussed with Dr. Antony Contras  Hyperglycemia She has a history of type 2 diabetes.  Most recent A1c was 6.5.  She reports 2 weeks of generalized fatigue, blurry vision, polyuria and polydipsia. She has a history of recurrent yeast infections for which she takes fluconazole.  She recently stopped her Jardiance for this reason.  Not currently  taking any antiglycemic agents.  She previously tried metformin but was unable to tolerate due to GI upset.  She denies any other symptoms including syncope, chest pain, diarrhea, fevers, hematuria, nausea or vomiting.   I think the most likely cause of her increased urinary frequency and generalized weakness is a urinary tract infection or hyperglycemia given her diabetes.  We will collect a CBC, BMP, TSH, urinalysis and urine dipstick, and point-of-care blood glucose today.    Update: Her BMP showed severe hyperglycemia with a blood glucose of 961 and an anion gap of 20.  Potassium is elevated at 5.2.  She has pseudohyponatremia which corrects to normal in the setting of her high blood sugar.  She also appears to have an AKI with elevated creatinine of 1.8.  Bicarb slightly low at 17.  We have given her 10 units of insulin aspart in the clinic and 15 units of Lantus.  I have spoke with her and family about these labs and she is agreeable to going to the emergency department for further evaluation and management with possible hospital admission.  Annett Fabian, MD [facility] Internal Medicine, PGY-1 Phone: [phone] Date 02/22/2023 Time 1:23 PM

## 2023-02-22 NOTE — Patient Instructions (Addendum)
Dear Ms.Reichl,  Thank you for your visit today! Hope you are felling better soon!  Please inject insulin as discussed today about the same time each day.  Also, please check your blood sugar every morning before eating or drinking and any other time as directed by the doctor.  I recommend a follow in about 2 weeks - can be on same day you see our doctor and can be scheduled at the same time  Please call for questions or concerns.  Norm Parcel, Diabetes Educator [phone]

## 2023-02-22 NOTE — ED Provider Notes (Signed)
[name] EMERGENCY DEPARTMENT AT [facility] Provider Note   CSN: [id number] Arrival date & time: 02/22/23  1318     History {Add pertinent medical, surgical, social history, OB history to HPI:1} No chief complaint on file.   Michelle Hurley is a 73 y.o. female.  HPI      73 year old female with a history of hypertension, type 2 diabetes who presents from internal medicine clinic with concern for generalized weakness, found to have severe hyperglycemia with anion gap  Past Medical History:  Diagnosis Date   Abdominal pain 05/22/2014   Allergic rhinitis 04/16/2015   Anxiety state, unspecified    ARTHRALGIA UNSPECIFIED SITE 12/04/2008   Qualifier: Diagnosis of  By: Sherene Sires MD, Charlaine Dalton    Arthritis    Atherosclerosis of abdominal aorta (HCC) 10/01/2015   Aortoiliac atherosclerosis noted on CT abdomen in 2016.   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 01/2005 Echo: EF 55-65%   Osteopenia    Other and unspecified hyperlipidemia    Palpitations    Plantar fasciitis, bilateral 06/12/2016   Plantar fasciitis, bilateral 06/12/2016   Rash 03/23/2017   Right ankle injury, initial encounter 06/12/2016   Right elbow pain 06/11/2017   Right leg pain 09/06/2016   Right optic neuritis 01/22/2018   Sciatica 01/15/2018   Shoulder pain, bilateral (Concern for CAD) 08/28/2018   Shoulder pain, bilateral (Concern for CAD) 08/28/2018   Umbilical hernia    Unspecified  essential hypertension    Vaginal candidiasis 02/12/2018   VENTRICULAR TACHYCARDIA 12/04/2008   F/u  [facility] Hochrein     Viral upper respiratory tract infection 01/15/2018   Vitamin D deficiency 02/12/2018     Home Medications Prior to Admission medications   Medication Sig Start Date End Date Taking? Authorizing Provider  acetaminophen (TYLENOL) 500 MG tablet Take 500 mg by mouth every 6 (six) hours as needed.    [provider]  aspirin EC 81 MG tablet Take 81 mg by mouth daily. Swallow whole.    [provider]  atorvastatin (LIPITOR) 80 MG tablet TAKE 1 TABLET EVERY DAY 11/06/22   Mercie Eon, MD  calcium-vitamin D 250-100 MG-UNIT tablet Take 1 tablet by mouth 2 (two) times daily. 02/26/20   Yvette Rack, MD  carvedilol (COREG) 25 MG tablet TAKE 1 TABLET TWICE DAILY WITH MEALS 10/20/22   Dickie La, MD  diclofenac Sodium (VOLTAREN) 1 % GEL APPLY 2 GRAMS TOPICALLY 4 (FOUR) TIMES DAILY AS NEEDED. 04/20/21   Ilene Qua, MD  DULoxetine (CYMBALTA) 60 MG capsule TAKE 1 CAPSULE EVERY DAY 02/12/23   Dickie La, MD  empagliflozin (JARDIANCE) 10 MG TABS tablet TAKE 1 TABLET EVERY DAY 02/12/23   Dickie La, MD  ezetimibe (ZETIA) 10 MG tablet Take 1 tablet (10 mg total) by mouth daily. 02/05/23   Dickie La, MD  fluconazole (DIFLUCAN) 150 MG tablet Take 1 tablet (150 mg total) by mouth once a week. 12/20/22  Kathleen Lime, MD  furosemide (LASIX) 40 MG tablet TAKE 1 TABLET EVERY DAY 01/05/23   Atway, Rayann N, DO  gabapentin (NEURONTIN) 300 MG capsule Take 1 capsule (300 mg total) by mouth 3 (three) times daily as needed. 11/10/22   Dickie La, MD  Heating Pads (SUNBEAM STANDARD HEATING PAD) PADS Please use as directed on the instructions included. 07/29/20   Verdene Lennert, MD  insulin glargine (LANTUS) 100 UNIT/ML Solostar Pen Inject 15 Units into the skin daily. 02/22/23   Annett Fabian, MD  levocetirizine (XYZAL) 5 MG tablet TAKE 1 TABLET EVERY EVENING 08/05/20   Verdene Lennert, MD   losartan (COZAAR) 25 MG tablet Take 1 tablet (25 mg total) by mouth daily. 02/05/23   Dickie La, MD  polyethylene glycol powder (GLYCOLAX/MIRALAX) powder Take 17 g by mouth daily as needed for moderate constipation or severe constipation. 04/30/15   Rice, Jamesetta Orleans, MD  riTUXimab (RITUXAN) 500 MG/50ML injection INFUSE 1000MG  INTRAVENOUSLY ON DAY 1 & DAY 15 EVERY 6 MONTHS 06/14/22   Penumalli, Glenford Bayley, MD  spironolactone (ALDACTONE) 25 MG tablet TAKE 1 TABLET EVERY DAY 10/20/22   Dickie La, MD  verapamil (CALAN) 120 MG tablet TAKE 1/2 TABLET THREE TIMES DAILY 01/05/23   Atway, Rayann N, DO  Zinc Oxide (DESITIN RAPID RELIEF) 13 % CREA Apply 1 application  topically daily. 06/06/22   Quincy Simmonds, MD      Allergies    Mushroom extract complex (obsolete), Codeine, Hydrocodone, Prednisone, and Omeprazole    Review of Systems   Review of Systems  Physical Exam Updated Vital Signs LMP 04/05/2012  Physical Exam  ED Results / Procedures / Treatments   Labs (all labs ordered are listed, but only abnormal results are displayed) Labs Reviewed - No data to display  EKG None  Radiology No results found.  Procedures Procedures  {Document cardiac monitor, telemetry assessment procedure when appropriate:1}  Medications Ordered in ED Medications - No data to display  ED Course/ Medical Decision Making/ A&P   {   Click here for ABCD2, HEART and other calculatorsREFRESH Note before signing :1}                              Medical Decision Making  ***  {Document critical care time when appropriate:1} {Document review of labs and clinical decision tools ie heart score, Chads2Vasc2 etc:1}  {Document your independent review of radiology images, and any outside records:1} {Document your discussion with family members, caretakers, and with consultants:1} {Document social determinants of health affecting pt's care:1} {Document your decision making why or why not admission, treatments  were needed:1} Final Clinical Impression(s) / ED Diagnoses Final diagnoses:  None    Rx / DC Orders ED Discharge Orders     None

## 2023-02-22 NOTE — Telephone Encounter (Signed)
Call from patient c/o problems with her eyes.  Patient is also very tired for a few days.  No appetite.  Has been drinking a lots of water and voiding a lot.  No fever.  C/O burning on urination. Patient given an appointment with Dr. Versie Starks this morning.

## 2023-02-22 NOTE — Assessment & Plan Note (Addendum)
She has a history of type 2 diabetes.  Most recent A1c was 6.5.  She reports 2 weeks of generalized fatigue, blurry vision, polyuria and polydipsia. She has a history of recurrent yeast infections for which she takes fluconazole.  She recently stopped her Jardiance for this reason.  Not currently taking any antiglycemic agents.  She previously tried metformin but was unable to tolerate due to GI upset.  She denies any other symptoms including syncope, chest pain, diarrhea, fevers, hematuria, nausea or vomiting.   I think the most likely cause of her increased urinary frequency and generalized weakness is a urinary tract infection or hyperglycemia given her diabetes.  We will collect a CBC, BMP, TSH, urinalysis and urine dipstick, and point-of-care blood glucose today.    Update: Her BMP showed severe hyperglycemia with a blood glucose of 961 and an anion gap of 20.  Potassium is elevated at 5.2.  She has pseudohyponatremia which corrects to normal in the setting of her high blood sugar.  She also appears to have an AKI with elevated creatinine of 1.8.  Bicarb slightly low at 17.  We have given her 10 units of insulin aspart in the clinic and 15 units of Lantus.  I have spoke with her and family about these labs and she is agreeable to going to the emergency department for further evaluation and management with possible hospital admission.

## 2023-02-22 NOTE — ED Notes (Signed)
Per lab Na 117, Glucose 867, [place], MD notified

## 2023-02-23 ENCOUNTER — Other Ambulatory Visit (HOSPITAL_COMMUNITY): Payer: Self-pay

## 2023-02-23 ENCOUNTER — Encounter (HOSPITAL_COMMUNITY): Payer: Self-pay | Admitting: Internal Medicine

## 2023-02-23 DIAGNOSIS — Z794 Long term (current) use of insulin: Secondary | ICD-10-CM

## 2023-02-23 DIAGNOSIS — E1122 Type 2 diabetes mellitus with diabetic chronic kidney disease: Secondary | ICD-10-CM | POA: Diagnosis not present

## 2023-02-23 DIAGNOSIS — E1110 Type 2 diabetes mellitus with ketoacidosis without coma: Principal | ICD-10-CM

## 2023-02-23 DIAGNOSIS — N183 Chronic kidney disease, stage 3 unspecified: Secondary | ICD-10-CM

## 2023-02-23 LAB — URINALYSIS, ROUTINE W REFLEX MICROSCOPIC
Bilirubin Urine: NEGATIVE
Glucose, UA: >=500 mg/dL — AB
Hgb urine dipstick: NEGATIVE
Ketones, ur: NEGATIVE mg/dL
Leukocytes,Ua: NEGATIVE
Nitrite: NEGATIVE
Protein, ur: NEGATIVE mg/dL
Specific Gravity, Urine: 1.008 (ref 1.005–1.030)
pH: 5 (ref 5.0–8.0)

## 2023-02-23 LAB — BASIC METABOLIC PANEL
Anion gap: 12 (ref 5–15)
Anion gap: 9 (ref 5–15)
BUN: 30 mg/dL — ABNORMAL HIGH (ref 8–23)
BUN: 26 mg/dL — ABNORMAL HIGH (ref 8–23)
CO2: 24 mmol/L (ref 22–32)
CO2: 24 mmol/L (ref 22–32)
Calcium: 9.9 mg/dL (ref 8.9–10.3)
Calcium: 9.3 mg/dL (ref 8.9–10.3)
Chloride: 95 mmol/L — ABNORMAL LOW (ref 98–111)
Chloride: 97 mmol/L — ABNORMAL LOW (ref 98–111)
Creatinine, Ser: 1.06 mg/dL — ABNORMAL HIGH (ref 0.44–1.00)
Creatinine, Ser: 0.98 mg/dL (ref 0.44–1.00)
GFR, Estimated: 55 mL/min — ABNORMAL LOW (ref >60)
GFR, Estimated: >60 mL/min (ref >60)
Glucose, Bld: 233 mg/dL — ABNORMAL HIGH (ref 70–99)
Glucose, Bld: 147 mg/dL — ABNORMAL HIGH (ref 70–99)
Potassium: 3.5 mmol/L (ref 3.5–5.1)
Potassium: 3.1 mmol/L — ABNORMAL LOW (ref 3.5–5.1)
Sodium: 129 mmol/L — ABNORMAL LOW (ref 135–145)
Sodium: 130 mmol/L — ABNORMAL LOW (ref 135–145)

## 2023-02-23 LAB — CBG MONITORING, ED
Glucose-Capillary: 134 mg/dL — ABNORMAL HIGH (ref 70–99)
Glucose-Capillary: 153 mg/dL — ABNORMAL HIGH (ref 70–99)
Glucose-Capillary: 153 mg/dL — ABNORMAL HIGH (ref 70–99)
Glucose-Capillary: 161 mg/dL — ABNORMAL HIGH (ref 70–99)
Glucose-Capillary: 182 mg/dL — ABNORMAL HIGH (ref 70–99)
Glucose-Capillary: 186 mg/dL — ABNORMAL HIGH (ref 70–99)
Glucose-Capillary: 205 mg/dL — ABNORMAL HIGH (ref 70–99)
Glucose-Capillary: 236 mg/dL — ABNORMAL HIGH (ref 70–99)
Glucose-Capillary: 266 mg/dL — ABNORMAL HIGH (ref 70–99)

## 2023-02-23 LAB — BETA-HYDROXYBUTYRIC ACID: Beta-Hydroxybutyric Acid: 0.1 mmol/L (ref 0.05–0.27)

## 2023-02-23 LAB — GLUCOSE, CAPILLARY: Glucose-Capillary: >600 mg/dL (ref 70–99)

## 2023-02-23 MED ORDER — INSULIN GLARGINE-YFGN 100 UNIT/ML ~~LOC~~ SOPN
10.0000 [IU] | PEN_INJECTOR | Freq: Every day | SUBCUTANEOUS | 0 refills | Status: DC
  Filled 2023-02-23 (×2): qty 9, 90d supply, fill #0

## 2023-02-23 MED ORDER — BLOOD GLUCOSE TEST VI STRP
1.0000 | ORAL_STRIP | Freq: Three times a day (TID) | 0 refills | Status: AC
  Filled 2023-02-23: qty 100, 30d supply, fill #0

## 2023-02-23 MED ORDER — INSULIN ASPART 100 UNIT/ML IJ SOLN: 0.0000 [IU] | Freq: Three times a day (TID) | INTRAMUSCULAR | Status: DC

## 2023-02-23 MED ORDER — POTASSIUM CHLORIDE 20 MEQ PO PACK
20.0000 meq | PACK | Freq: Once | ORAL | Status: AC
Start: 2023-02-23 — End: 2023-02-23
  Administered 2023-02-23: 20 meq via ORAL
  Filled 2023-02-23: qty 1

## 2023-02-23 MED ORDER — LANCET DEVICE MISC
1.0000 | Freq: Three times a day (TID) | 0 refills | Status: AC
  Filled 2023-02-23: qty 1, 30d supply, fill #0

## 2023-02-23 MED ORDER — ACCU-CHEK SOFTCLIX LANCETS MISC
1.0000 | Freq: Three times a day (TID) | 0 refills | Status: AC
  Filled 2023-02-23: qty 100, 30d supply, fill #0

## 2023-02-23 MED ORDER — INSULIN GLARGINE-YFGN 100 UNIT/ML ~~LOC~~ SOLN
10.0000 [IU] | Freq: Every day | SUBCUTANEOUS | Status: DC
  Administered 2023-02-23: 10 [IU] via SUBCUTANEOUS
  Filled 2023-02-23: qty 0.1

## 2023-02-23 MED ORDER — INSULIN PEN NEEDLE 32G X 4 MM MISC
1.0000 | Freq: Three times a day (TID) | 0 refills | Status: DC
  Filled 2023-02-23: qty 200, 67d supply, fill #0

## 2023-02-23 MED ORDER — BLOOD GLUCOSE MONITOR SYSTEM W/DEVICE KIT
1.0000 | PACK | Freq: Three times a day (TID) | 0 refills | Status: AC
  Filled 2023-02-23: qty 1, 30d supply, fill #0

## 2023-02-23 NOTE — ED Notes (Signed)
Secure chat sent to MD regarding stable CBG; pt requesting to eat.

## 2023-02-23 NOTE — ED Notes (Signed)
Patient labs sent

## 2023-02-23 NOTE — Discharge Summary (Signed)
Name: Michelle Hurley MRN: [id number] DOB: [birth year] 73 y.o. PCP: Dickie La, MD  Date of Admission: 02/22/2023  1:34 PM Date of Discharge: 02/23/23 Attending Physician: Dr. Antony Contras  Discharge Diagnosis: Principal Problem:   DKA (diabetic ketoacidosis) [facility])    Discharge Medications: Allergies as of 02/23/2023       Reactions   Mushroom Extract Complex (obsolete) Anaphylaxis, Itching, Swelling   Swelling all over body    Codeine Nausea And Vomiting   Hydrocodone Nausea And Vomiting   Prednisone Nausea And Vomiting   Omeprazole Itching        Medication List     STOP taking these medications    glipiZIDE 2.5 MG 24 hr tablet Commonly known as: GLUCOTROL XL   Jardiance 10 MG Tabs tablet Generic drug: empagliflozin   Lantus SoloStar 100 UNIT/ML Solostar Pen Generic drug: insulin glargine   nitrofurantoin 100 MG capsule Commonly known as: MACRODANTIN       TAKE these medications    acetaminophen 500 MG tablet Commonly known as: TYLENOL Take 500 mg by mouth every 6 (six) hours as needed.   aspirin EC 81 MG tablet Take 81 mg by mouth daily. Swallow whole.   atorvastatin 80 MG tablet Commonly known as: LIPITOR TAKE 1 TABLET EVERY DAY   Blood Glucose Monitor System w/Device Kit 1 each 3 (three) times daily. May dispense any manufacturer covered by patient's insurance.   BLOOD GLUCOSE TEST STRIPS Strp 1 each by Does not apply route 3 (three) times daily. Use as directed to check blood sugar. May dispense any manufacturer covered by patient's insurance and fits patient's device.   BLOOD GLUCOSE TEST STRIPS Strp 1 each by In Vitro route in the morning, at noon, and at bedtime. May substitute to any manufacturer covered by patient's insurance.   calcium-vitamin D 250-100 MG-UNIT tablet Take 1 tablet by mouth 2 (two) times daily.   carvedilol 25 MG tablet Commonly known as: COREG TAKE 1 TABLET TWICE DAILY WITH MEALS   Desitin Rapid Relief 13 %  Crea Generic drug: Zinc Oxide Apply 1 application  topically daily.   diclofenac Sodium 1 % Gel Commonly known as: VOLTAREN APPLY 2 GRAMS TOPICALLY 4 (FOUR) TIMES DAILY AS NEEDED.   DULoxetine 60 MG capsule Commonly known as: CYMBALTA TAKE 1 CAPSULE EVERY DAY   ezetimibe 10 MG tablet Commonly known as: ZETIA Take 1 tablet (10 mg total) by mouth daily.   fluconazole 150 MG tablet Commonly known as: DIFLUCAN Take 1 tablet (150 mg total) by mouth once a week.   furosemide 40 MG tablet Commonly known as: LASIX TAKE 1 TABLET EVERY DAY   gabapentin 300 MG capsule Commonly known as: NEURONTIN Take 1 capsule (300 mg total) by mouth 3 (three) times daily as needed.   insulin glargine-yfgn 100 UNIT/ML Pen Commonly known as: SEMGLEE Inject 10 Units into the skin daily. May substitute as needed per insurance.   Lancet Device Misc 1 each by Does not apply route in the morning, at noon, and at bedtime. May substitute to any manufacturer covered by patient's insurance.   Lancets Misc. Misc 1 each by Does not apply route in the morning, at noon, and at bedtime. May substitute to any manufacturer covered by patient's insurance.   levocetirizine 5 MG tablet Commonly known as: XYZAL TAKE 1 TABLET EVERY EVENING   losartan 25 MG tablet Commonly known as: COZAAR Take 1 tablet (25 mg total) by mouth daily.   Pen Needles 31G X 5 MM Misc  1 each by Does not apply route 3 (three) times daily. May dispense any manufacturer covered by patient's insurance.   polyethylene glycol powder 17 GM/SCOOP powder Commonly known as: GLYCOLAX/MIRALAX Take 17 g by mouth daily as needed for moderate constipation or severe constipation.   Rituxan 500 MG/50ML injection Generic drug: riTUXimab INFUSE 1000MG  INTRAVENOUSLY ON DAY 1 & DAY 15 EVERY 6 MONTHS   spironolactone 25 MG tablet Commonly known as: ALDACTONE TAKE 1 TABLET EVERY DAY   Sunbeam Standard Heating Pad Pads Please use as directed on the  instructions included.   verapamil 120 MG tablet Commonly known as: CALAN TAKE 1/2 TABLET THREE TIMES DAILY        Disposition and follow-up:   [name] was discharged from [facility] in Stable condition.  At the hospital follow up visit please address:  1.  Follow-up:  T2DM, DKA: started on 10 units long acting insulin at discharge. Check blood glucose, A1c if indicated  2.  Labs / imaging needed at time of follow-up: blood glucose  3.  Pending labs/ test needing follow-up:   4.  Medication Changes  Started: Glargine 10 units daily  Follow-up Appointments:  Follow-up Information     Talmage Coin, MD. Schedule an appointment as soon as possible for a visit in 1 month(s).   Specialty: Endocrinology Contact information: [address] [place] [address] [phone]                 Hospital Course by problem list:   Diabetic ketoacidosis T2DM Patient was seen in [facility] clinic for lethargy, found to have blood glucose >900 and anion gap of 20. She was sent to the ED, where she was started on Endotool and LR infusion. Her anion gap closed overnight and was tolerating po intake. On hospital day 1 she was transitioned to 10 units subcutaneous insulin. Patient was discharged with Glargine 10 units daily and blood sugar monitoring supplies. Diabetes educator saw patient prior to discharge.   Overall, this patient's presentation is unusual with previously well controlled T2DM. Last A1c was 6.5 in December 2025. At that time her [place] Pepper was discontinued due to recurrent yeast infections. She does endorse similar symptoms of a yeast infection during the past two weeks, which may have triggered her hyperglycemia. Additionally, she reports a decreased appetite and has had unintentional weight loss of approximately 10 pounds in the last two months. This may be due to profound polyuria and water weight loss. She will need close outpatient  follow up to evaluate weight loss and any other recurrent symptoms  Blurry vision Patient reports onset of blurry vision in the last two weeks. She has a history of neuromyelitis optica and receives Rituxin infusions. Her fatigue, polyuria, and polydipsia had resolved with treatment of her DKA, however her blurry vision persisted. She will need ophthalmology follow up outpatient.   Weight loss Patient has had unintentional weight loss of ~10lbs in the past two months. This may be due to water weight loss from polyuria, however she will need further outpatient follow up to see if her weight returns to baseline with resolution of DKA.   AKI on CKD3 Creatinine on admission 1.94 from baseline ~1.2. She received an LR infusion at 190mL/hr for treatment of DKA, by hospital day 1 patient's creatinine had returned to baseline.   HTN: antihypertensives held HLD: home statin held Hx TIA: home ASA held    Discharge Subjective: Patient feeling very well this morning,  no new complaints. She feels her weakness and lethargy has resolved. No difficulty tolerating breakfast. She endorses continued blurry vision, which she had an ophthalmology appointment for but missed due to being admitted. She feels ready to be discharged today.   Discharge Exam:   BP 110/82   Pulse 78   Temp 98.2 F (36.8 C) (Oral)   Resp 14   LMP 04/05/2012   SpO2 99%  Constitutional: well-appearing, in no acute distress Cardiovascular: regular rate and rhythm, no m/r/g Pulmonary/Chest: normal work of breathing on room air, lungs clear to auscultation bilaterally Abdominal: soft, non-tender, non-distended Neurological: alert & oriented x 3, 5/5 strength in bilateral upper and lower extremities Skin: warm and dry  Pertinent Labs, Studies, and Procedures:     Latest Ref Rng & Units 02/22/2023    2:19 PM 02/22/2023    2:18 PM 02/22/2023   10:52 AM  CBC  WBC 4.0 - 10.5 K/uL  4.4  4.6   Hemoglobin 12.0 - 15.0 g/dL 84.6  96.2   95.2   Hematocrit 36.0 - 46.0 % 46.0  41.2  40.3   Platelets 150 - 400 K/uL  264  257        Latest Ref Rng & Units 02/23/2023    7:03 AM 02/23/2023    2:14 AM 02/22/2023    6:18 PM  CMP  Glucose 70 - 99 mg/dL 841  324  401   BUN 8 - 23 mg/dL 26  30  38   Creatinine 0.44 - 1.00 mg/dL 0.27  2.53  6.64   Sodium 135 - 145 mmol/L 130  129  123   Potassium 3.5 - 5.1 mmol/L 3.1  3.5  3.6   Chloride 98 - 111 mmol/L 97  95  85   CO2 22 - 32 mmol/L 24  24  23    Calcium 8.9 - 10.3 mg/dL 9.3  9.9  9.5    Signed: Monna Fam, MD PGY-1 02/23/2023, 12:21 PM   Pager: [phone]

## 2023-02-23 NOTE — Discharge Instructions (Addendum)
Ms Sorina, Derrig were hospitalized for diabetic ketoacidosis. You have been treated with IV fluids and insulin, and your high blood sugar is now stable. I feel comfortable discharging you with outpatient follow up and a new prescription. Thank you for allowing [name] to be part of your care.   We arranged for you to follow up at: [facility] on 02/28/23 at 9:15am with Dr Versie Starks  Please note these changes made to your medications:   *Please START taking:  Insulin Glargine 10 units daily Please use the blood sugar monitoring supplies to check your sugar first thing in the morning and at least one other time during the day  Please make sure to return to the hospital if you are having fatigue, weakness, nausea, vomiting, sweating, or dizziness.   Please call our clinic if you have any questions or concerns, we may be able to help and keep you from a long and expensive emergency room wait. Our clinic and after hours phone number is [phone], the best time to call is Monday through Friday 9 am to 4 pm but there is always someone available 24/7 if you have an emergency. If you need medication refills please notify your pharmacy one week in advance and they will send [name] a request.

## 2023-02-23 NOTE — Progress Notes (Signed)
Transition of Care [facility]) - Inpatient Brief Assessment   Patient Details  Name: Francys Bolin MRN: [id number] Date of Birth: [birth year]  Transition of Care [facility]) CM/SW Contact:    Oletta Cohn, RN Phone Number: 02/23/2023, 11:14 AM   Clinical Narrative: RNCM met with pt and daughter at bedside regarding discharge plan.  Pt states that she lives interdependently in her senior apartment that is equipped with grab bars and wide doors, but she is concerned about having to stick her finger to check her blood sugar.  RNCM assured her that Diabetes Coordinator would teach her about monitoring her glucose levels and how to check it.   RNCM  sent a secure chat to DM Coordinator Boneta Lucks) to visit pt in ED as she is anticipating discharging from ED.   Transition of Care Asessment: Insurance and Status: (P) Insurance coverage has been reviewed Patient has primary care physician: (P) Yes Home environment has been reviewed: (P) Senior apartment; alone Prior level of function:: (P) independent Prior/Current Home Services: (P) No current home services Social Drivers of Health Review: (P) SDOH reviewed no interventions necessary Readmission risk has been reviewed: (P) Yes Transition of care needs: (P) transition of care needs identified, TOC will continue to follow

## 2023-02-23 NOTE — ED Notes (Signed)
Patient laying in bed resting. Patient is in no noted distress at the present time will continue to monitor for any changes.

## 2023-02-23 NOTE — Care Management Obs Status (Signed)
MEDICARE OBSERVATION STATUS NOTIFICATION   Patient Details  Name: Michelle Hurley MRN: [id number] Date of Birth: 1950/09/21   Medicare Observation Status Notification Given:  Yes    Oletta Cohn, RN 02/23/2023, 10:44 AM

## 2023-02-23 NOTE — Inpatient Diabetes Management (Signed)
Inpatient Diabetes Program Recommendations  AACE/ADA: New Consensus Statement on Inpatient Glycemic Control (2015)  Target Ranges:  Prepandial:   less than 140 mg/dL      Peak postprandial:   less than 180 mg/dL (1-2 hours)      Critically ill patients:  140 - 180 mg/dL   Lab Results  Component Value Date   GLUCAP 182 (H) 02/23/2023   HGBA1C 6.5 (A) 12/20/2022    Review of Glycemic Control  Latest Reference Range & Units 02/23/23 07:12 02/23/23 08:10 02/23/23 12:15  Glucose-Capillary 70 - 99 mg/dL 161 (H) 096 (H) 045 (H)   Diabetes history: DM 2 Outpatient Diabetes medications:  Lantus 10 units daily Current orders for Inpatient glycemic control:  Novolog 0-15 units tid with meals  Semglee 10 units daily  Inpatient Diabetes Program Recommendations:    Saw patient prior to discharge.  Went by [facility] pharmacy and picked up medications for discharge which included meter, strips, lancets and insulin pen needles.  Patient's family was at bedside.  Her son has diabetes and is familiar with Accuchek meter that was ordered.  She states she feels good about use of insulin pen (taught to her yesterday in clinic).  We reviewed signs and symptoms of hypoglycemia and how to treat.  Encouraged her to check her blood sugars at least bid and reviewed normal blood sugar value.  Of note, patient states she had 2 Rituxin infusions last month and states that she felt symptoms of high blood sugars after those infusions.  Encouraged her to call to see if "steroid" was given with infusion which could also affect blood sugars?  Patient does drink caffeine free pepsi.  I encouraged her to reduce sugar intake in beverages and try either diet soda or water instead.  Patient verbalized understanding.  Family very supportive.   Thanks,  Lorenza Cambridge, RN, BC-ADM Inpatient Diabetes Coordinator Pager [phone]  (8a-5p)

## 2023-02-23 NOTE — ED Notes (Signed)
Hospitalist team at bedside assessing pt.

## 2023-02-25 LAB — URINE CULTURE: Culture: >=100000 — AB

## 2023-02-26 ENCOUNTER — Telehealth: Payer: Self-pay | Admitting: Internal Medicine

## 2023-02-26 ENCOUNTER — Telehealth (HOSPITAL_BASED_OUTPATIENT_CLINIC_OR_DEPARTMENT_OTHER): Payer: Self-pay | Admitting: *Deleted

## 2023-02-26 NOTE — Telephone Encounter (Signed)
Post ED Visit - Positive Culture Follow-up  Culture report reviewed by antimicrobial stewardship pharmacist: [facility] [x]  Ivery Quale Pharm.D. []  Celedonio Miyamoto, Pharm.D., BCPS AQ-ID []  Garvin Fila, Pharm.D., BCPS []  Georgina Pillion, Pharm.D., BCPS []  [place], [address].D., BCPS, AAHIVP []  Estella Husk, Pharm.D., BCPS, AAHIVP []  Lysle Pearl, PharmD, BCPS []  Phillips Climes, PharmD, BCPS []  Agapito Games, PharmD, BCPS []  Verlan Friends, PharmD []  Mervyn Gay, PharmD, BCPS []  Vinnie Level, PharmD  [facility] []  Len Childs, PharmD []  Greer Pickerel, PharmD []  Adalberto Cole, PharmD []  Perlie Gold, Rph []  Lonell Face) Jean Rosenthal, PharmD []  Earl Many, PharmD []  Junita Push, PharmD []  Dorna Leitz, PharmD []  Terrilee Files, PharmD []  Lynann Beaver, PharmD []  Keturah Barre, PharmD []  Loralee Pacas, PharmD []  Bernadene Person, PharmD   Positive Urine culture Treated with Nitrofurantoin Macrocrystal , organism sensitive to the same and no further patient follow-up is required at this time.  Bing Quarry 02/26/2023, 9:27 AM

## 2023-02-26 NOTE — Telephone Encounter (Signed)
Pt states she was instructed to call her Dr.'s office on Monday morning to speak with a nurse after her  D/C.  Pt is currently sch for her HFU 02/28/2023.

## 2023-02-26 NOTE — Telephone Encounter (Signed)
Return pt's call. Pt stated the cause of her high BS was b/c of the steroid with Rituxan infusion at the neurologist's office. And stated someone from this office needs to call and inform their office. Stated she BS this am @ 0730 Am was 369. Her vision is still blurry. Stated she was told @ discharge to take 15 units of Lantus;Med list states 10 units. Also stated she was told to pick up Glipizide but d/c instruction states to stop. Stated she's getting confused; requesting a call back from the doctor. Pt has an HFU appt on Wed 2/19. Stated she cannot come in today nor tomorrow for an appt b/c she does not drive with the blurried vision.

## 2023-02-27 ENCOUNTER — Telehealth: Payer: Self-pay | Admitting: *Deleted

## 2023-02-27 NOTE — Telephone Encounter (Signed)
Please call patient states she took her medication last night as instructed but forgot and took again this AM. Want to be sure she will be okay.

## 2023-02-27 NOTE — Telephone Encounter (Signed)
Call from patient states she took her 20 Units of Lantus last night.  She took her sugar this more and it was 340.  She accidentally took another dose of the insulin and then ate her breakfast.  She says she fills ok.  What should she do.

## 2023-02-27 NOTE — Telephone Encounter (Signed)
Called to discuss with Michelle Hurley. She took Lantus 20U around 715 pm, then again this morning around 1100 with breakfast. She feels fine. We discussed regular glucose monitoring throughout today and regular meal/snack intake. Reviewed hypoglycemia signs/symptoms. ED precautions advised. Her daughter will be staying with her tonight. Plan to come to scheduled appointment tomorrow AM for hospital f/u.

## 2023-02-27 NOTE — Telephone Encounter (Signed)
See telephone encounter per Sherlean Foot.

## 2023-02-28 ENCOUNTER — Encounter: Payer: Self-pay | Admitting: Student

## 2023-02-28 ENCOUNTER — Ambulatory Visit: Payer: Medicare HMO | Admitting: Student

## 2023-02-28 VITALS — BP 119/72 | HR 73 | Temp 98.0°F | Ht 62.0 in | Wt 205.8 lb

## 2023-02-28 DIAGNOSIS — G360 Neuromyelitis optica [Devic]: Secondary | ICD-10-CM

## 2023-02-28 DIAGNOSIS — Z7984 Long term (current) use of oral hypoglycemic drugs: Secondary | ICD-10-CM | POA: Diagnosis not present

## 2023-02-28 DIAGNOSIS — E1165 Type 2 diabetes mellitus with hyperglycemia: Secondary | ICD-10-CM | POA: Diagnosis not present

## 2023-02-28 DIAGNOSIS — Z794 Long term (current) use of insulin: Secondary | ICD-10-CM

## 2023-02-28 DIAGNOSIS — Z6837 Body mass index (BMI) 37.0-37.9, adult: Secondary | ICD-10-CM | POA: Diagnosis not present

## 2023-02-28 LAB — GLUCOSE, CAPILLARY: Glucose-Capillary: 376 mg/dL — ABNORMAL HIGH (ref 70–99)

## 2023-02-28 LAB — POCT GLYCOSYLATED HEMOGLOBIN (HGB A1C): Hemoglobin A1C: 13.6 % — AB (ref 4.0–5.6)

## 2023-02-28 MED ORDER — DEXCOM G7 RECEIVER DEVI: 3 refills | Status: DC

## 2023-02-28 MED ORDER — DEXCOM G7 SENSOR MISC: 3 refills | Status: DC

## 2023-02-28 NOTE — Progress Notes (Signed)
     CC: Hospital Follow Up after admission from 2/13 to 2/14 for DKA  HPI:  Michelle Hurley is a 73 y.o. female with pertinent PMH of NMO, type 2 diabetes, HTN who presents to the clinic for hospital follow up. Please see assessment and plan below for further details.  Review of Systems:   Pertinent items noted in HPI and/or A&P.  Physical Exam:  Vitals:   02/28/23 0901  BP: 119/72  Pulse: 73  Temp: 98 F (36.7 C)  TempSrc: Oral  SpO2: 100%  Weight: 205 lb 12.8 oz (93.4 kg)  Height: 5\' 2"  (1.575 m)   Well-appearing woman, in no distress Heart rate and rhythm regular, no murmurs; no lower extremity edema Lungs clear to auscultation bilaterally, normal respiratory rate and effort No skin changes Alert and oriented, no focal neurological deficits Blurry vision bilaterally, improved from prior but persistent; no focal visual deficits  Assessment & Plan:   Diabetes mellitus (HCC) She was recently admitted to the hospital last week for diabetic ketoacidosis and is here for follow-up.  Hemoglobin A1c today is 13.6%.  Blood glucose is 376.  She has been using 20 units of long-acting insulin daily in addition to Januvia 100 mg daily and glipizide 2.5 mg daily.  Her blood sugar readings are still quite elevated with an average of 368, high of 546, and a low of 264.  Her fasting sugars are in the 300s.  She states that she feels much better.  She is no longer weak, her blurry vision is still there but has significantly improved, and she is no longer having polyuria/polydipsia.  Her DKA is thought to be related to the Solu-Medrol she received during her rituximab infusions for NMO.  Given her current insulin use and significantly elevated sugars, I feel she would benefit from a Dexcom.  I have placed an order for this and instructed her about how to use it.  I will also place a referral to Lupita Leash, our diabetes educator, for further education regarding the Dexcom and insulin.  She missed  an ophthalmology appointment during her hospitalization.  I have encouraged her to call their office and reschedule this appointment.  We will increase her insulin to 25 units nightly.  She can continue to take the glipizide and Januvia.  We will follow-up in 2 weeks.  Neuromyelitis optica (HCC) Chronic and stable.  She has been on rituximab infusions which are administered with Solu-Medrol.  This is thought to be the etiology of her recent diabetic ketoacidosis episode.  The hospital team spoke with her neurologist, who has agreed with this assessment and plans to decrease the Solu-Medrol dose with each infusion.   OBESITY, MORBID BMI 37.64 today, consistent with obesity.  Complicated by type 2 diabetes, hyperlipidemia, hypertension.  Encouraged continued dietary and exercise changes for weight loss.   Patient discussed with Dr. Jonita Albee, MD [facility] Internal Medicine Resident PGY-1 Clinic Phone: [phone] Pager: [phone]

## 2023-02-28 NOTE — Assessment & Plan Note (Addendum)
Chronic and stable.  She has been on rituximab infusions which are administered with Solu-Medrol.  This is thought to be the etiology of her recent diabetic ketoacidosis episode.  The hospital team spoke with her neurologist, who has agreed with this assessment and plans to decrease the Solu-Medrol dose with each infusion.

## 2023-02-28 NOTE — Assessment & Plan Note (Addendum)
She was recently admitted to the hospital last week for diabetic ketoacidosis and is here for follow-up.  Hemoglobin A1c today is 13.6%.  Blood glucose is 376.  She has been using 20 units of long-acting insulin daily in addition to Januvia 100 mg daily and glipizide 2.5 mg daily.  Her blood sugar readings are still quite elevated with an average of 368, high of 546, and a low of 264.  Her fasting sugars are in the 300s.  She states that she feels much better.  She is no longer weak, her blurry vision is still there but has significantly improved, and she is no longer having polyuria/polydipsia.  Her DKA is thought to be related to the Solu-Medrol she received during her rituximab infusions for NMO.  Given her current insulin use and significantly elevated sugars, I feel she would benefit from a Dexcom.  I have placed an order for this and instructed her about how to use it.  I will also place a referral to Lupita Leash, our diabetes educator, for further education regarding the Dexcom and insulin.  She missed an ophthalmology appointment during her hospitalization.  I have encouraged her to call their office and reschedule this appointment.  We will increase her insulin to 25 units nightly.  She can continue to take the glipizide and Januvia.  We will follow-up in 2 weeks.

## 2023-02-28 NOTE — Assessment & Plan Note (Signed)
BMI 37.64 today, consistent with obesity.  Complicated by type 2 diabetes, hyperlipidemia, hypertension.  Encouraged continued dietary and exercise changes for weight loss.

## 2023-02-28 NOTE — Patient Instructions (Signed)
Thank you, Ms.Caydence Kim for allowing [name] to provide your care today. Today we discussed your recent hospital stay and diabetes.  As we discussed, your blood sugars are still elevated on average.  I recommended you go up to 25 units of long-acting insulin every day.  I have also placed an order for a continuous glucose monitor which should be sent to your pharmacy.  You may continue taking your to oral diabetes medicines as well.  Please contact your eye doctor to reschedule the appointment that you missed during your hospital stay.  We have spoken with the doctor who is helping you with your rituximab infusions about lowering the steroid dose to try to prevent recurrence of your high blood sugar.  I have ordered the following labs for you:  Lab Orders         Glucose, capillary         POC Hbg A1C      Follow up: 2 weeks   We look forward to seeing you next time. Please call our clinic at [phone] if you have any questions or concerns. The best time to call is Monday-Friday from 9am-4pm, but there is someone available 24/7. If after hours or the weekend, call the main hospital number and ask for the Internal Medicine Resident On-Call. If you need medication refills, please notify your pharmacy one week in advance and they will send [name] a request.   Thank you for trusting me with your care. Wishing you the best!   Annett Fabian, MD [facility]

## 2023-03-06 DIAGNOSIS — H43823 Vitreomacular adhesion, bilateral: Secondary | ICD-10-CM | POA: Diagnosis not present

## 2023-03-06 DIAGNOSIS — E119 Type 2 diabetes mellitus without complications: Secondary | ICD-10-CM | POA: Diagnosis not present

## 2023-03-06 DIAGNOSIS — H1013 Acute atopic conjunctivitis, bilateral: Secondary | ICD-10-CM | POA: Diagnosis not present

## 2023-03-06 DIAGNOSIS — H25813 Combined forms of age-related cataract, bilateral: Secondary | ICD-10-CM | POA: Diagnosis not present

## 2023-03-06 DIAGNOSIS — H469 Unspecified optic neuritis: Secondary | ICD-10-CM | POA: Diagnosis not present

## 2023-03-06 LAB — HM DIABETES EYE EXAM

## 2023-03-08 NOTE — Progress Notes (Signed)
 Internal Medicine Clinic Attending  Case discussed with the resident at the time of the visit.  We reviewed the resident's history and exam and pertinent patient test results.  I agree with the assessment, diagnosis, and plan of care documented in the resident's note.

## 2023-03-12 ENCOUNTER — Telehealth: Payer: Self-pay | Admitting: Neurology

## 2023-03-12 NOTE — Telephone Encounter (Signed)
 Received a message from MD's in regard to pt care. She was recently admitted in hospital for DKA.  Per Dr Marjory Lies, "Thank you for the update. She had done well in the past with her infusions, but for some reason her sugars went out of control this time.  Yes we could reduce the solumedrol dosing dosing to 30 or 60mg  premedication; in addition to education and closer monitoring of her sugars around the time of her infusion, and sliding scale insulin adjustment. Hopefully this will work better for her."  I have reached out to the infusion suite who states that her next infusion is scheduled 6/23 and 7/7. I have updated the order to reflect the decrease in the premedication for solumedrol.

## 2023-04-02 ENCOUNTER — Ambulatory Visit (INDEPENDENT_AMBULATORY_CARE_PROVIDER_SITE_OTHER): Admitting: Dietician

## 2023-04-02 DIAGNOSIS — E1122 Type 2 diabetes mellitus with diabetic chronic kidney disease: Secondary | ICD-10-CM | POA: Diagnosis not present

## 2023-04-02 DIAGNOSIS — N1831 Chronic kidney disease, stage 3a: Secondary | ICD-10-CM | POA: Diagnosis not present

## 2023-04-02 NOTE — Patient Instructions (Addendum)
 Please schedule an appointment with Dr. Sol Blazing.  We talked about your A1C today. Lab Results  Component Value Date   HGBA1C 13.6 (A) 02/28/2023   HGBA1C 6.5 (A) 12/20/2022   HGBA1C 6.9 (A) 05/01/2022   HGBA1C 7.3 (A) 02/03/2022   HGBA1C 6.8 (A) 08/22/2021    Whole grain bread should have 3 grams fiber per slice.  Non diabetic blood sugars are 80 -110 mg/dL. This is where it should be before eating.   Follow up as needed for nutrition and diabetes.  Lupita Leash [phone]

## 2023-04-04 NOTE — Progress Notes (Signed)
 Lab Results  Component Value Date   HGBA1C 13.6 (A) 02/28/2023   HGBA1C 6.5 (A) 12/20/2022   HGBA1C 6.9 (A) 05/01/2022   HGBA1C 7.3 (A) 02/03/2022   HGBA1C 6.8 (A) 08/22/2021    Diabetes Self-Management Education  Visit Type: Follow-up (1st after initial)  Appt. Start Time: 1030 Appt. End Time: 1130  04/04/2023  [name], identified by name and date of birth, is a 73 y.o. female with a diagnosis of Diabetes:  (states she does not believe she has diabetes and wants to stop all of her medicines. I spoke with Dr. Mayford Knife today, who deferred this topic to Dr. Sol Blazing. patient was encouraged to schedule an appointment with her.).   ASSESSMENT  Estimated body mass index is 37.64 kg/m as calculated from the following:   Height as of 02/28/23: 5\' 2"  (1.575 m).   Weight as of 02/28/23: 205 lb 12.8 oz (93.4 kg). Wt Readings from Last 10 Encounters:  02/28/23 205 lb 12.8 oz (93.4 kg)  02/22/23 199 lb 12.8 oz (90.6 kg)  12/20/22 211 lb 8 oz (95.9 kg)  11/13/22 210 lb (95.3 kg)  09/06/22 205 lb 4.8 oz (93.1 kg)  08/24/22 209 lb (94.8 kg)  06/06/22 210 lb 6.4 oz (95.4 kg)  05/01/22 211 lb (95.7 kg)  03/14/22 209 lb (94.8 kg)  02/22/22 208 lb 6.4 oz (94.5 kg)   Taking medicines as prescribed with resulting blood sugars for 2 weeks well controlled as below.   CGM Results from download:   % Time CGM active:   94 %   (Goal >70%)  Average glucose:   158 mg/dL for 14 days  Glucose management indicator:   7.1 %  Time in range (70-180 mg/dL):   77 %   (Goal >29%)  Time High (181-250 mg/dL):   21 %   (Goal < 52%)  Time Very High (>250 mg/dL):    2 %   (Goal < 5%)  Time Low (54-69 mg/dL):   0 %   (Goal <8%)  Time Very Low (<54 mg/dL):   0 %   (Goal <4%)  Coefficient of variation:   21.5 %   (Goal <36%)      Diabetes Self-Management Education - 04/04/23 0900       Visit Information   Visit Type Follow-up   1st after initial     Initial Visit   Diabetes Type --   states she does  not believe she has diabetes and wants to stop all of her medicines. I spoke with Dr. Mayford Knife today, who deferred this topic to Dr. Sol Blazing. patient was encouraged to schedule an appointment with her.     Health Coping   How would you rate your overall health? Good      Pre-Education Assessment   Patient understands monitoring blood glucose, interpreting and using results Needs Review    Patient understands how to develop strategies to address psychosocial issues. Needs Instruction      Complications   Last HgB A1C per patient/outside source 13.6 %    How often do you check your blood sugar? > 4 times/day    Fasting Blood glucose range (mg/dL) 132-440;102-725    Postprandial Blood glucose range (mg/dL) 366-440;347-425;>956    Number of hypoglycemic episodes per month 0    Number of hyperglycemic episodes ( >200mg /dL): Daily    Can you tell when your blood sugar is high? No      Dietary Intake   Breakfast is now making  healthier choices, fruits, vegetables, baked foods, whole grains and drinking mostly water.      Patient Education   Previous Diabetes Education Yes (please comment)   1 time here   Monitoring Identified appropriate SMBG and/or A1C goals.;Taught/evaluated CGM (comment);Taught/discussed recording of test results and interpretation of SMBG.   reviewed her CGM report with her and doctor Williams   Diabetes Stress and Support Identified and addressed patients feelings and concerns about diabetes   mostly listened to patient today as she voiced her frustrations and feeling about having been diagnosed with diabetes. she beleives it was due to steroids and feel upset that this medicine was used.     Individualized Goals (developed by patient)   Monitoring  Consistenly use CGM      Patient Self-Evaluation of Goals - Patient rates self as meeting previously set goals (% of time)   Medications >75% (most of the time)    Monitoring >75% (most of the time)      Post-Education  Assessment   Patient understands using medications safely. Comphrehends key points    Patient understands monitoring blood glucose, interpreting and using results Comprehends key points    Patient understands how to develop strategies to address psychosocial issues. Comprehends key points      Outcomes   Expected Outcomes Demonstrated interest in learning but significant barriers to change    Future DMSE Yearly    Program Status Completed      Subsequent Visit   Since your last visit have you continued or begun to take your medications as prescribed? Yes    Since your last visit have you had your blood pressure checked? No    Since your last visit have you experienced any weight changes? No change    Since your last visit, are you checking your blood glucose at least once a day? Yes             Individualized Plan for Diabetes Self-Management Training:   Learning Objective:  Patient will have a greater understanding of diabetes self-management. Patient education plan is to attend individual and/or group sessions per assessed needs and concerns.   Plan:   Patient Instructions  Please schedule an appointment with Dr. Sol Blazing.  We talked about your A1C today. Lab Results  Component Value Date   HGBA1C 13.6 (A) 02/28/2023   HGBA1C 6.5 (A) 12/20/2022   HGBA1C 6.9 (A) 05/01/2022   HGBA1C 7.3 (A) 02/03/2022   HGBA1C 6.8 (A) 08/22/2021    Whole grain bread should have 3 grams fiber per slice.  Non diabetic blood sugars are 80 -110 mg/dL. This is where it should be before eating.   Follow up as needed for nutrition and diabetes.  Lupita Leash [phone]     Expected Outcomes:  Demonstrated interest in learning but significant barriers to change  Education material provided: Diabetes Resources  If problems or questions, patient to contact team via:  Phone  Future DSME appointment: Yearly or sooner as appropriate Norm Parcel, RD 04/04/2023 9:47 AM.

## 2023-04-09 ENCOUNTER — Telehealth: Payer: Self-pay | Admitting: Dietician

## 2023-04-09 NOTE — Telephone Encounter (Signed)
 Call to patient because she I son my schedule and per our conversation last week was to follow up with a doctor.  She wants a doctor appointment tomorrow . Not an appointment with me. Agrees to cancel tomorrow's appointment with me and call was transferred to make an appointment with a doctor or her doctor.

## 2023-04-10 ENCOUNTER — Ambulatory Visit (INDEPENDENT_AMBULATORY_CARE_PROVIDER_SITE_OTHER): Admitting: Internal Medicine

## 2023-04-10 ENCOUNTER — Inpatient Hospital Stay: Payer: Self-pay | Admitting: Dietician

## 2023-04-10 VITALS — BP 117/64 | HR 80 | Temp 98.1°F | Ht 62.0 in | Wt 207.8 lb

## 2023-04-10 DIAGNOSIS — Z794 Long term (current) use of insulin: Secondary | ICD-10-CM

## 2023-04-10 DIAGNOSIS — E1165 Type 2 diabetes mellitus with hyperglycemia: Secondary | ICD-10-CM | POA: Diagnosis not present

## 2023-04-10 DIAGNOSIS — L0231 Cutaneous abscess of buttock: Secondary | ICD-10-CM | POA: Diagnosis not present

## 2023-04-10 MED ORDER — DEXCOM G7 SENSOR MISC: 3 refills | Status: DC

## 2023-04-10 MED ORDER — SITAGLIPTIN PHOSPHATE 100 MG PO TABS: 100.0000 mg | ORAL_TABLET | Freq: Every day | ORAL | 3 refills | Status: DC

## 2023-04-10 MED ORDER — INSULIN GLARGINE 100 UNIT/ML SOLOSTAR PEN: 20.0000 [IU] | PEN_INJECTOR | Freq: Every day | SUBCUTANEOUS | 3 refills | Status: DC

## 2023-04-10 NOTE — Progress Notes (Unsigned)
 Subjective:  CC: diabetes  HPI:  [name] is a 73 y.o. female with a past medical history of CKD stage III, diabetes, hypertension, acid reflux, history of TIA, neuromyelitis optica, who presents today for diabetes follow-up.   Patient was seen by Norm Parcel on 3/24.  Office notes indicated that patient does not believe that she has diabetes and wanted to stop all of her medications. Patient was hospitalized in February with DKA.  DKA thought to be related to Solu-Medrol she received during rituximab infusions for NMO. Last A1c was 13.6 February 28, 2023.  Started on Dexcom at that office visit.  In talking with her today she was not aware that her A1c was at 13 in February.  She understands that she has diabetes but is frustrated at having to be on insulin.  She feels like she has made a lot of dietary changes including cutting out sodas and eating less starchy foods.  She is open to doing what ever she needs to do to help her health.  Please see problem based assessment and plan for additional details.  Past Medical History:  Diagnosis Date   Abdominal pain 05/22/2014   Allergic rhinitis 04/16/2015   Anxiety state, unspecified    ARTHRALGIA UNSPECIFIED SITE 12/04/2008   Qualifier: Diagnosis of  By: Sherene Sires MD, Charlaine Dalton    Arthritis    Atherosclerosis of abdominal aorta (HCC) 10/01/2015   Aortoiliac atherosclerosis noted on CT abdomen in 2016.   Atypical chest pain 12/11/2018   Cough with congestion of paranasal sinus 06/01/2015   Dizziness 04/06/2012   Essential hypertension 12/24/2006   Estrogen deficiency 11/16/2016   GERD (gastroesophageal reflux disease) 08/07/2018   H/O: hysterectomy    Hx-TIA (transient ischemic attack) 04/06/2012   Hypertension    Hypokalemia 02/12/2018   Loss of part of visual field    Morbid obesity (HCC)    Myopia    Nausea with vomiting 05/23/2014   Neuromyelitis optica (HCC) 01/22/2018   NSVT (nonsustained ventricular tachycardia)  (HCC)    a. 01/2005 Echo: EF 55-65%   Other and unspecified hyperlipidemia    Palpitations    Plantar fasciitis, bilateral 06/12/2016   Plantar fasciitis, bilateral 06/12/2016   Rash 03/23/2017   Right ankle injury, initial encounter 06/12/2016   Right elbow pain 06/11/2017   Right leg pain 09/06/2016   Right optic neuritis 01/22/2018   Sciatica 01/15/2018   Shoulder pain, bilateral (Concern for CAD) 08/28/2018   Umbilical hernia    Unspecified essential hypertension    Vaginal candidiasis 02/12/2018   VENTRICULAR TACHYCARDIA 12/04/2008   F/u  [facility]     Viral upper respiratory tract infection 01/15/2018   Vitamin D deficiency 02/12/2018    MEDICATIONS:  Carvedilol 25 mg Dexcom Xyzal Rituximab Spironolactone 25 mg Verapamil 60 milligrams 3 times daily 10 mg Glargine 10 units daily Sitagliptin 100 mg daily- not taking Glipizide 2.5 mg daily- not taking Losartan 25 mg daily Gabapentin 300 mg 3 times daily as needed Furosemide 40 mg daily Duloxetine 60 mg daily Atorvastatin 80 mg daily Aspirin 81 mg  Family History  Problem Relation Age of Onset   Heart disease Mother    Diabetes Mother    Lung cancer Mother    Hypertension Brother    Heart attack Brother    Diabetes Sister    Breast cancer Sister    Diabetes Sister     Past Surgical History:  Procedure Laterality Date   ABDOMINAL HYSTERECTOMY  01/10/1972  BREAST BIOPSY Left 04/14/2019   BREAST BIOPSY Left 10/31/2021   BREAST BIOPSY Left 11/20/2022   MM LT BREAST BX W LOC DEV 1ST LESION IMAGE BX SPEC STEREO GUIDE 11/20/2022 [facility]   ESOPHAGOGASTRODUODENOSCOPY N/A 05/28/2014   Procedure: ESOPHAGOGASTRODUODENOSCOPY (EGD);  Surgeon: Dorena Cookey, MD;  Location: [facility];  Service: Endoscopy;  Laterality: N/A;   LAPAROSCOPIC SMALL BOWEL RESECTION N/A 06/04/2014   Procedure: LAPAROSCOPIC SMALL BOWEL RESECTION;  Surgeon: Claud Kelp, MD;  Location: [facility];  Service: General;  Laterality:  N/A;   LAPAROSCOPIC TRANSABDOMINAL HERNIA N/A 05/03/2014   Procedure: LAPAROSCOPIC EXPLORATION WITH OPEN REPAIR OF INCARCERATED EPIGASTRIC HERNIA ;  Surgeon: Ovidio Kin, MD;  Location: [facility];  Service: General;  Laterality: N/A;     Social History   Socioeconomic History   Marital status: Divorced    Spouse name: Not on file   Number of children: 2   Years of education: college   Highest education level: Not on file  Occupational History   Occupation: Retired  Tobacco Use   Smoking status: Never   Smokeless tobacco: Never  Vaping Use   Vaping status: Never Used  Substance and Sexual Activity   Alcohol use: No    Alcohol/week: 0.0 standard drinks of alcohol   Drug use: No   Sexual activity: Not Currently  Other Topics Concern   Not on file  Social History Narrative   Current Social History 04/02/2020        Patient lives alone in an apartment which is 1 story. There are not steps up to the entrance.       Patient's method of transportation is personal car.      The highest level of education was some college.      The patient currently retired.      Identified important Relationships are "God and family"       Pets : None       Interests / Fun: Travel with her sisters, although this has stopped d/t Covid       Current Stressors: Right shoulder to lower-mid back pain X 3-4 months        Social Drivers of Corporate investment banker Strain: Low Risk  (09/28/2022)   Overall Financial Resource Strain (CARDIA)    Difficulty of Paying Living Expenses: Not hard at all  Food Insecurity: No Food Insecurity (02/23/2023)   Hunger Vital Sign    Worried About Running Out of Food in the Last Year: Never true    Ran Out of Food in the Last Year: Never true  Transportation Needs: No Transportation Needs (02/23/2023)   PRAPARE - Administrator, Civil Service (Medical): No    Lack of Transportation (Non-Medical): No  Physical Activity: Inactive (09/28/2022)    Exercise Vital Sign    Days of Exercise per Week: 0 days    Minutes of Exercise per Session: 0 min  Stress: No Stress Concern Present (09/28/2022)   [employer] of Occupational Health - Occupational Stress Questionnaire    Feeling of Stress : Not at all  Social Connections: Moderately Isolated (02/23/2023)   Social Connection and Isolation Panel [NHANES]    Frequency of Communication with Friends and Family: More than three times a week    Frequency of Social Gatherings with Friends and Family: Three times a week    Attends Religious Services: More than 4 times per year    Active Member of Clubs or Organizations: No  Attends Banker Meetings: Never    Marital Status: Divorced  Catering manager Violence: Not At Risk (02/23/2023)   Humiliation, Afraid, Rape, and Kick questionnaire    Fear of Current or Ex-Partner: No    Emotionally Abused: No    Physically Abused: No    Sexually Abused: No    Review of Systems: ROS negative except for what is noted on the assessment and plan.  Objective:   Vitals:   04/10/23 1508  BP: 117/64  Pulse: 80  Temp: 98.1 F (36.7 C)  TempSrc: Oral  SpO2: 100%  Weight: 207 lb 12.8 oz (94.3 kg)  Height: 5\' 2"  (1.575 m)    Physical Exam: Constitutional: well-appearing, in no acute distress Cardiovascular: regular rate and rhythm, no m/r/g Pulmonary/Chest: normal work of breathing on room air, lungs clear to auscultation bilaterally Abdominal: soft, non-tender, non-distended MSK: 0.25 cm skin abscess to gluteal cleft, no surrounding erythema to skin Neurological: alert & oriented x 3, normal gait Skin: warm and dry  Assessment & Plan:  Diabetes mellitus (HCC) CGM report from today was reviewed.   CGM data summary   Average is    92%  for 14 days   Time sensor is active   92 %   Time in range (70-180 mg/dL):  86 % (Goal >16%)   Time High (181-250 mg/dL)  14 % (Goal < 10%)   Time Very High (>250 mg/dl)  0 % (Goal < 5%)    Time Low (54-69 mg/dL)  0 % (Goal is <9%)   Time Very Low (<54)  0%  (Goal <1%)   The patient is currently using Continuous Glucose Monitoring. The patient is injecting insulin 1 time a day with Glargine 25 units. She stopped taking glipizide and [place] as she ran out out medications. A: predicted A1c from CGM at 6.8%.  I would like to decrease the amount of insulin that she is requiring to help prevent side effects such as weight gain. DKA thought to be triggered by Solu-Medrol given during rituximab infusion.  Neurology has reduced dose of Solu-Medrol for rituximab infusion scheduled for June. P: Decrease glargine from 25 units to 20 units. Restart sitagliptin 100 mg daily.  She is not a good candidate for SGLT2 in the setting of DKA and recurrent yeast infections.  I do think she could be a good candidate for GLP-1.  I started conversation with her but for today will just do minimal changes and would revisit GLP-1 conversation in June. Continue Dexcom Glipizide removed from medication list  She will be due for repeat A1c in 1 month.   She has follow-up with PCP in June.  Skin abscess She noted small boil that started on Saturday.  She has been applying warm compresses multiple times daily for the last few days.  She denies fevers and area is not painful. A: 0.5 cm abscess present to gluteal cleft, no drainage but some fluctuance present.   P: Continue warm compresses multiple times daily.  Return precautions given  Patient discussed with Dr. Hurshel Keys Michelle Hurley, D.O. [facility]  PGY-3 Pager: [phone]  Phone: [phone] Date 04/11/2023  Time 9:30 AM

## 2023-04-10 NOTE — Patient Instructions (Addendum)
 Thank you, Ms.Gennie Sarvis for allowing [name] to provide your care today.   Diabetes Decrease glargine from 25 units to 20 units Start januvia (sitagliptin) 100 mg daily which I sent to [facility] I also sent in refills of dexcom sensors to [facility] along with insulin.  Follow-up at end of May and we will recheck your diabetes blood work.  Boil Please continue warm compresses. If you develop fevers, it gets bigger or becomes more painful then do come back to office or go to urgent care   I have ordered the following medication/changed the following medications:   Stop the following medications: There are no discontinued medications.   Start the following medications: No orders of the defined types were placed in this encounter.    Follow up:  2 months  We look forward to seeing you next time. Please call our clinic at [phone] if you have any questions or concerns. The best time to call is Monday-Friday from 9am-4pm, but there is someone available 24/7. If after hours or the weekend, call the main hospital number and ask for the Internal Medicine Resident On-Call. If you need medication refills, please notify your pharmacy one week in advance and they will send [name] a request.   Thank you for trusting me with your care. Wishing you the best!   Rudene Christians, DO [facility]

## 2023-04-11 ENCOUNTER — Encounter: Payer: Self-pay | Admitting: Internal Medicine

## 2023-04-11 DIAGNOSIS — L0291 Cutaneous abscess, unspecified: Secondary | ICD-10-CM | POA: Insufficient documentation

## 2023-04-11 HISTORY — DX: Cutaneous abscess, unspecified: L02.91

## 2023-04-11 NOTE — Assessment & Plan Note (Addendum)
 She noted small boil that started on Saturday.  She has been applying warm compresses multiple times daily for the last few days.  She denies fevers and area is not painful. A: 0.25 cm abscess present to gluteal cleft, no drainage but some fluctuance present.   P: Continue warm compresses multiple times daily.  Return precautions given

## 2023-04-11 NOTE — Assessment & Plan Note (Addendum)
 CGM report from today was reviewed.   CGM data summary   Average is    92%  for 14 days   Time sensor is active   92 %   Time in range (70-180 mg/dL):  86 % (Goal >62%)   Time High (181-250 mg/dL)  14 % (Goal < 13%)   Time Very High (>250 mg/dl)  0 % (Goal < 5%)   Time Low (54-69 mg/dL)  0 % (Goal is <0%)   Time Very Low (<54)  0%  (Goal <1%)   The patient is currently using Continuous Glucose Monitoring. The patient is injecting insulin 1 time a day with Glargine 25 units. She stopped taking glipizide and [place] as she ran out out medications. A: predicted A1c from CGM at 6.8%.  I would like to decrease the amount of insulin that she is requiring to help prevent side effects such as weight gain. DKA thought to be triggered by Solu-Medrol given during rituximab infusion.  Neurology has reduced dose of Solu-Medrol for rituximab infusion scheduled for June. P: Decrease glargine from 25 units to 20 units. Restart sitagliptin 100 mg daily.  She is not a good candidate for SGLT2 in the setting of DKA and recurrent yeast infections.  I do think she could be a good candidate for GLP-1.  I started conversation with her but for today will just do minimal changes and would revisit GLP-1 conversation in June. Continue Dexcom Glipizide removed from medication list  She will be due for repeat A1c in 1 month.   She has follow-up with PCP in June.

## 2023-04-12 NOTE — Progress Notes (Signed)
 Internal Medicine Clinic Attending  Case discussed with the resident at the time of the visit.  We reviewed the resident's history and exam and pertinent patient test results.  I agree with the assessment, diagnosis, and plan of care documented in the resident's note.

## 2023-04-18 ENCOUNTER — Telehealth: Payer: Self-pay | Admitting: *Deleted

## 2023-04-18 NOTE — Telephone Encounter (Signed)
 Pt was called / informed she can wait for her next appt in June.

## 2023-04-18 NOTE — Telephone Encounter (Signed)
 Yearly microalbumin/creatinine ratio due later in April. We can plan to get this done at her next visit in June. Last year's values were normal. Please call to relay the above message. Thank you.

## 2023-04-18 NOTE — Telephone Encounter (Signed)
 Copied from CRM [id number]. Topic: General - Other >> Apr 18, 2023  4:21 PM Prudencio Pair wrote: Reason for CRM: Patient called stating that she was looking at her MyChart and states it says she needs to come in and give her yearly urine sample. Patient states she was just in the office on April 1st. Checked chart & there is nothing stating that patient is needing to do a urine sample. Please give patient a call back to advise whether or not she needs to complete a urine sample. CB #: [id number].

## 2023-05-08 ENCOUNTER — Ambulatory Visit (INDEPENDENT_AMBULATORY_CARE_PROVIDER_SITE_OTHER): Payer: Medicare HMO | Admitting: Diagnostic Neuroimaging

## 2023-05-08 ENCOUNTER — Encounter: Payer: Self-pay | Admitting: Diagnostic Neuroimaging

## 2023-05-08 VITALS — BP 124/77 | HR 74 | Ht 62.0 in | Wt 208.0 lb

## 2023-05-08 DIAGNOSIS — G360 Neuromyelitis optica [Devic]: Secondary | ICD-10-CM

## 2023-05-08 NOTE — Patient Instructions (Signed)
  Neuromyelitis optica (+ NMO ab; + optic neuritis; + ? spinal cord lesion) - continue rituximab  every 6 months (Jan / July); may use lower solumedrol premedication of 30mg  due to prior hyperglycemia - check CBC, immunoglobulin panel every 6 months (rituximab  monitoring)  DIABETES  - continue insulin  and januvia

## 2023-05-08 NOTE — Progress Notes (Signed)
 [facility]  PATIENT: Michelle Hurley DOB: 1950-01-29  REFERRING CLINICIAN: Bevelyn Bryant, MD  HISTORY FROM: patient and chart REASON FOR VISIT: follow up   HISTORICAL  CHIEF COMPLAINT:  Chief Complaint  Patient presents with   Follow-up    Rm 6 alone Pt is well and stable, reports no new concerns. Here to discuss infusions, has been having recent issues with sugar control.      HISTORY OF PRESENT ILLNESS:   UPDATE (05/08/23, VRP): Since last visit, doing well except in Feb 22, 2023, had hyperglycemia event / DKA and A1c jumped to 13.6 (had stopped jardiance  in Dec 2024; also had rituxan  infusion with solumedrol premedication a few weeks prior to hospitalization; althoug hshe has been receiving this regimen going back to 2020, every 6 months). Other NMO symptoms are stable.  UPDATE (11/13/22, VRP): Since last visit, doing well. Symptoms are stable. Severity is mild. No alleviating or aggravating factors. Tolerating rituxan .   UPDATE (03/14/22, VRP): Since last visit, doing well. Symptoms are stable. Severity is mild. No alleviating or aggravating factors. Tolerating rituxan .     UPDATE (06/22/21, VRP): Since last visit, doing well. Symptoms are stable. No alleviating or aggravating factors. Tolerating rituximab  infusions.Michelle Hurley    UPDATE (12/15/20, VRP): Since last visit, doing well. Symptoms are stable. Pain is improved. No alleviating or aggravating factors. Tolerating rituximab .    UPDATE (10/22/2019, VRP): Since last visit, doing well until 3 to 4 weeks ago when she developed right thoracic and flank pain, numbness and tingling in her right axillary region and right triceps region.  Patient tried muscle relaxers from PCP without relief.  Patient tried over-the-counter topical pain relievers without relief.  Patient has had MRI of the brain, cervical and thoracic spine ordered and scheduled for the next few weeks.  No other triggering factors.  Some tenderness to  palpation just below her shoulder blade on the right side.  Vision stable.  Lower extremity stable.  Left arm stable.  UPDATE (05/19/19, VRP): Since last visit, doing well. Had Moderna COVID vaccine in March / April 2021; doing well. Last rituxan  infusion in Jan 2021. Symptoms are stable. No alleviating or aggravating factors.     UPDATE (11/18/18, VRP): Since last visit, doing well. Symptoms are improved. Right eye vision almost back to normal. No alleviating or aggravating factors. Tolerating rituxan ; next dose in Dec 2020.   VIDEO VISIT UPDATE (05/15/18, VRP):  - overall feels well - vision is improved - no pain - waiting on insurance approval for rituxan   PRIOR HPI (01/30/18): 73 year old female with hypertension, here for evaluation of right retrobulbar, idiopathic optic neuritis.  01/22/2018 patient presented to the hospital after seeing her ophthalmologist for new onset right eye vision loss.  She reported an altitudinal superior visual field defect with some pain.  No problems in the left eye.  Upon admission patient had MRI of the brain and orbits which showed enhancing right optic nerve lesion.  Mild chronic small vessel ischemic disease was also noted as well as scattered chronic cerebral microhemorrhages.  Patient was diagnosed with idiopathic optic neuritis.  She was treated with IV Solu-Medrol .  She had some side effects of hallucinations and irritability.  She completed her course and was discharged home.  Since that time symptoms are continuing to improve.  No problems with arms, legs, balance, bowel or bladder dysfunction.   REVIEW OF SYSTEMS: Full 14 system review of systems performed and negative with exception of: as per HPI.   ALLERGIES:  Allergies  Allergen Reactions   Mushroom Extract Complex (Obsolete) Anaphylaxis, Itching and Swelling    Swelling all over body    Codeine Nausea And Vomiting   Hydrocodone  Nausea And Vomiting   Prednisone Nausea And Vomiting   Omeprazole   Itching    HOME MEDICATIONS: Outpatient Medications Prior to Visit  Medication Sig Dispense Refill   acetaminophen  (TYLENOL ) 500 MG tablet Take 500 mg by mouth every 6 (six) hours as needed.     aspirin  EC 81 MG tablet Take 81 mg by mouth daily. Swallow whole.     atorvastatin  (LIPITOR) 80 MG tablet TAKE 1 TABLET EVERY DAY 90 tablet 3   Blood Glucose Monitoring Suppl (BLOOD GLUCOSE MONITOR SYSTEM) w/Device KIT Use as directed 3 (three) times daily. 1 kit 0   calcium -vitamin D  250-100 MG-UNIT tablet Take 1 tablet by mouth 2 (two) times daily. 30 tablet 3   carvedilol  (COREG ) 25 MG tablet TAKE 1 TABLET TWICE DAILY WITH MEALS 180 tablet 3   Continuous Glucose Receiver (DEXCOM G7 RECEIVER) DEVI Please use with Dexcom sensor 1 each 3   Continuous Glucose Sensor (DEXCOM G7 SENSOR) MISC Please apply 1 Dexcom sensor every 10 days to the fatty area in the back of your arm. 3 each 3   diclofenac  Sodium (VOLTAREN ) 1 % GEL APPLY 2 GRAMS TOPICALLY 4 (FOUR) TIMES DAILY AS NEEDED. (Patient taking differently: Apply 2 g topically as needed.) 100 g 2   ezetimibe  (ZETIA ) 10 MG tablet Take 1 tablet (10 mg total) by mouth daily. 90 tablet 3   furosemide  (LASIX ) 40 MG tablet TAKE 1 TABLET EVERY DAY 90 tablet 3   gabapentin  (NEURONTIN ) 300 MG capsule Take 1 capsule (300 mg total) by mouth 3 (three) times daily as needed. (Patient taking differently: Take 300 mg by mouth as needed.) 90 capsule 5   Glucose Blood (BLOOD GLUCOSE TEST STRIPS) STRP Use as directed and take 3 (three) times daily. Use as directed to check blood sugar. 100 strip 0   insulin  glargine (LANTUS ) 100 UNIT/ML Solostar Pen Inject 20 Units into the skin daily. 15 mL 3   Insulin  Pen Needle 32G X 4 MM MISC Use as directed 3 (three) times daily. 300 each 0   levocetirizine (XYZAL ) 5 MG tablet TAKE 1 TABLET EVERY EVENING 90 tablet 1   losartan  (COZAAR ) 25 MG tablet Take 1 tablet (25 mg total) by mouth daily. 90 tablet 3   polyethylene glycol powder  (GLYCOLAX /MIRALAX ) powder Take 17 g by mouth daily as needed for moderate constipation or severe constipation. 255 g 0   sitaGLIPtin  (JANUVIA ) 100 MG tablet Take 1 tablet (100 mg total) by mouth daily. 30 tablet 3   spironolactone  (ALDACTONE ) 25 MG tablet TAKE 1 TABLET EVERY DAY 90 tablet 3   verapamil  (CALAN ) 120 MG tablet TAKE 1/2 TABLET THREE TIMES DAILY (Patient taking differently: Take 0.5 tablets by mouth 3 (three) times daily.) 135 tablet 3   Zinc Oxide (DESITIN RAPID RELIEF) 13 % CREA Apply 1 application  topically daily. 454 g 0   DULoxetine  (CYMBALTA ) 60 MG capsule TAKE 1 CAPSULE EVERY DAY (Patient not taking: Reported on 05/08/2023) 90 capsule 0   fluconazole  (DIFLUCAN ) 150 MG tablet Take 1 tablet (150 mg total) by mouth once a week. (Patient not taking: Reported on 05/08/2023) 24 tablet 0   Heating Pads (SUNBEAM STANDARD HEATING PAD) PADS Please use as directed on the instructions included. (Patient not taking: Reported on 05/08/2023) 1 each 0   riTUXimab  (RITUXAN )  500 MG/50ML injection INFUSE 1000MG  INTRAVENOUSLY ON DAY 1 & DAY 15 EVERY 6 MONTHS (Patient not taking: Reported on 05/08/2023) 200 mL 1   No facility-administered medications prior to visit.    PHYSICAL EXAM  GENERAL EXAM/CONSTITUTIONAL: Vitals:  Vitals:   05/08/23 1418  BP: 124/77  Pulse: 74  Weight: 208 lb (94.3 kg)  Height: 5\' 2"  (1.575 m)    Body mass index is 38.04 kg/m. Wt Readings from Last 3 Encounters:  05/08/23 208 lb (94.3 kg)  04/10/23 207 lb 12.8 oz (94.3 kg)  02/28/23 205 lb 12.8 oz (93.4 kg)   No results found.  Patient is in no distress; well developed, nourished and groomed; neck is supple  CARDIOVASCULAR: Examination of carotid arteries is normal; no carotid bruits Regular rate and rhythm, no murmurs Examination of peripheral vascular system by observation and palpation is normal  EYES: Ophthalmoscopic exam of optic discs and posterior segments is normal; no papilledema or  hemorrhages No results found.  MUSCULOSKELETAL: Gait, strength, tone, movements noted in Neurologic exam below  NEUROLOGIC: MENTAL STATUS:      No data to display         awake, alert, oriented to person, place and time recent and remote memory intact normal attention and concentration language fluent, comprehension intact, naming intact fund of knowledge appropriate  CRANIAL NERVE:  2nd - no papilledema on fundoscopic exam 2nd, 3rd, 4th, 6th - pupils 2mm equal and reactive to light, visual fields full to confrontation, extraocular muscles intact, no nystagmus 5th - facial sensation symmetric 7th - facial strength symmetric 8th - hearing intact 9th - palate elevates symmetrically, uvula midline 11th - shoulder shrug symmetric 12th - tongue protrusion midline  MOTOR:  normal bulk and tone, full strength in the BUE, BLE  SENSORY:  normal and symmetric to light touch, temperature, vibration  COORDINATION:  finger-nose-finger, fine finger movements normal  REFLEXES:  deep tendon reflexes present and symmetric  GAIT/STATION:  narrow based gait     DIAGNOSTIC DATA (LABS, IMAGING, TESTING) - I reviewed patient records, labs, notes, testing and imaging myself where available.  Lab Results  Component Value Date   WBC 4.4 02/22/2023   HGB 15.6 (H) 02/22/2023   HCT 46.0 02/22/2023   MCV 79.7 (L) 02/22/2023   PLT 264 02/22/2023      Component Value Date/Time   NA 130 (L) 02/23/2023 0703   NA 139 06/06/2022 1646   K 3.1 (L) 02/23/2023 0703   CL 97 (L) 02/23/2023 0703   CO2 24 02/23/2023 0703   GLUCOSE 147 (H) 02/23/2023 0703   BUN 26 (H) 02/23/2023 0703   BUN 19 06/06/2022 1646   CREATININE 0.98 02/23/2023 0703   CREATININE 1.03 (H) 11/19/2014 1131   CALCIUM  9.3 02/23/2023 0703   PROT 6.5 02/09/2021 1526   ALBUMIN 4.1 02/09/2021 1526   AST 15 02/09/2021 1526   ALT 24 02/09/2021 1526   ALKPHOS 68 02/09/2021 1526   BILITOT <0.2 02/09/2021 1526   GFRNONAA  >60 02/23/2023 0703   GFRNONAA 57 (L) 11/19/2014 1131   GFRAA 50 (L) 02/25/2020 1005   GFRAA 65 11/19/2014 1131   Lab Results  Component Value Date   CHOL 139 05/01/2022   HDL 34 (L) 05/01/2022   LDLCALC 85 05/01/2022   LDLDIRECT 182.8 04/27/2010   TRIG 111 05/01/2022   CHOLHDL 4.1 05/01/2022   Lab Results  Component Value Date   HGBA1C 13.6 (A) 02/28/2023   Lab Results  Component Value Date  JYNWGNFA21 664 01/23/2018   Lab Results  Component Value Date   TSH 0.858 02/22/2023   Lab Results  Component Value Date   ANA Negative 01/23/2018    01/30/18  NMO IgG Autoantibodies 0.0 - 3.0 U/mL 260.6High      11/10/2019 MRI brain with and without 1. No acute intracranial abnormality. 2. No specific evidence of demyelination. Mild scattered T2/FLAIR hyperintensities within the white matter, which are nonspecific but most suggestive of age-appropriate chronic microvascular ischemic disease. No abnormal enhancement. 3. Small prior microhemorrhages in the pons and right thalamus, most likely related to hypertension.   11/10/2019 MRI cervical spine without 1. Patchy T2 signal abnormality involving the bilateral hemi cord at the level of C4-5, most characteristic of chronic myelomalacia. No other signal abnormality within the cervical spinal cord to suggest demyelinating disease. 2. Multilevel cervical spondylosis with resultant mild to moderate diffuse spinal stenosis at C3-4 through C6-7. 3. Multifactorial degenerative changes with resultant moderate to severe C4 through C8 foraminal narrowing as detailed above, most notable at C5 and C6 on the right.  11/10/2019 MRI thoracic without 1. Normal MRI appearance of the thoracic spinal cord. No evidence for demyelinating disease. 2. Mild multilevel degenerative spondylosis with resultant mild flattening of the left ventral cord at T4-5 through T8-9 as above. No significant spinal stenosis or neural impingement.  07/03/22 MRI  cervical spine (with and without) demonstrating: -Chronic myelomalacia changes at C4 level.  No abnormal enhancing lesions.  No new spinal cord lesions since 2021. -Multilevel degenerative changes as above with mild spinal stenosis and variable foraminal stenosis.     ASSESSMENT AND PLAN  73 y.o. year old female here with right optic neuritis (right eye; retrobulbar) and positive NMO antibodies.  Dx:  1. Neuromyelitis optica (HCC)      PLAN:  Neuromyelitis optica (+ NMO ab; + optic neuritis; + ? spinal cord lesion) - continue rituximab  every 6 months (Jan / July); may use lower solumedrol premedication of 30mg  due to prior hyperglycemia - check CBC, immunoglobulin panel every 6 months (rituximab  monitoring)  DIABETES  - continue insulin  and januvia   Orders Placed This Encounter  Procedures   Immunoglobulins, QN, A/E/G/M   CBC with Differential/Platelet   Return in about 6 months (around 11/07/2023).    Omega Bible, MD 12/15/2020, 3:00PM Certified in Neurology, Neurophysiology and Neuroimaging  [facility] [address] [place], [place] [address] [phone]

## 2023-05-11 LAB — CBC WITH DIFFERENTIAL/PLATELET
Basophils Absolute: 0 10*3/uL (ref 0.0–0.2)
Basos: 1 %
EOS (ABSOLUTE): 0.1 10*3/uL (ref 0.0–0.4)
Eos: 1 %
Hematocrit: 37.2 % (ref 34.0–46.6)
Hemoglobin: 12.1 g/dL (ref 11.1–15.9)
Immature Grans (Abs): 0 10*3/uL (ref 0.0–0.1)
Immature Granulocytes: 0 %
Lymphocytes Absolute: 1.4 10*3/uL (ref 0.7–3.1)
Lymphs: 25 %
MCH: 27.7 pg (ref 26.6–33.0)
MCHC: 32.5 g/dL (ref 31.5–35.7)
MCV: 85 fL (ref 79–97)
Monocytes Absolute: 0.6 10*3/uL (ref 0.1–0.9)
Monocytes: 11 %
Neutrophils Absolute: 3.4 10*3/uL (ref 1.4–7.0)
Neutrophils: 62 %
Platelets: 278 10*3/uL (ref 150–450)
RBC: 4.37 x10E6/uL (ref 3.77–5.28)
RDW: 13.5 % (ref 11.7–15.4)
WBC: 5.6 10*3/uL (ref 3.4–10.8)

## 2023-05-11 LAB — IMMUNOGLOBULINS A/E/G/M, SERUM
IgA/Immunoglobulin A, Serum: 162 mg/dL (ref 64–422)
IgE (Immunoglobulin E), Serum: 13 [IU]/mL (ref 6–495)
IgG (Immunoglobin G), Serum: 850 mg/dL (ref 586–1602)
IgM (Immunoglobulin M), Srm: 22 mg/dL — ABNORMAL LOW (ref 26–217)

## 2023-05-22 ENCOUNTER — Ambulatory Visit: Payer: Self-pay | Admitting: Diagnostic Neuroimaging

## 2023-05-31 ENCOUNTER — Other Ambulatory Visit: Payer: Self-pay | Admitting: Student

## 2023-05-31 DIAGNOSIS — E1165 Type 2 diabetes mellitus with hyperglycemia: Secondary | ICD-10-CM

## 2023-06-06 ENCOUNTER — Other Ambulatory Visit: Payer: Self-pay | Admitting: Internal Medicine

## 2023-06-06 ENCOUNTER — Telehealth: Payer: Self-pay

## 2023-06-06 DIAGNOSIS — E1165 Type 2 diabetes mellitus with hyperglycemia: Secondary | ICD-10-CM

## 2023-06-06 NOTE — Telephone Encounter (Unsigned)
 Copied from CRM [id number]. Topic: Clinical - Medication Refill >> Jun 06, 2023 10:04 AM Brynn Caras wrote: Medication: Blood Glucose Monitoring Suppl (BLOOD GLUCOSE MONITOR SYSTEM) w/Device KIT  Has the patient contacted their pharmacy? No (Agent: If no, request that the patient contact the pharmacy for the refill. If patient does not wish to contact the pharmacy document the reason why and proceed with request.) (Agent: If yes, when and what did the pharmacy advise?)  This is the patient's preferred pharmacy:   [facility] [address] ([address] [place] - [address] [place] ([place]) [place] [address] Phone: [phone] Fax: [phone]  Is this the correct pharmacy for this prescription? Yes If no, delete pharmacy and type the correct one.   Has the prescription been filled recently? No  Is the patient out of the medication? Yes, the patient lost her monitor while traveling.  Has the patient been seen for an appointment in the last year OR does the patient have an upcoming appointment? Yes  Can we respond through MyChart? No  Agent: Please be advised that Rx refills may take up to 3 business days. We ask that you follow-up with your pharmacy.

## 2023-06-06 NOTE — Telephone Encounter (Signed)
 Unable to reach pt, LVM to call the clinic back.

## 2023-06-06 NOTE — Telephone Encounter (Signed)
 Copied from CRM [id number]. Topic: Appointments - Appointment Info/Confirmation >> Jun 05, 2023 11:44 AM Madelyne Schiff wrote: Patient would like to know if she can do her June appt by video. She is not in town.   Please advise.

## 2023-06-12 ENCOUNTER — Encounter: Admitting: Internal Medicine

## 2023-06-12 MED ORDER — DEXCOM G7 SENSOR MISC: 3 refills | Status: DC

## 2023-07-02 ENCOUNTER — Other Ambulatory Visit: Payer: Self-pay | Admitting: Internal Medicine

## 2023-07-02 DIAGNOSIS — H469 Unspecified optic neuritis: Secondary | ICD-10-CM | POA: Diagnosis not present

## 2023-07-02 DIAGNOSIS — E1165 Type 2 diabetes mellitus with hyperglycemia: Secondary | ICD-10-CM

## 2023-07-02 MED ORDER — DEXCOM G7 SENSOR MISC: 3 refills | Status: DC

## 2023-07-02 MED ORDER — SITAGLIPTIN PHOSPHATE 100 MG PO TABS: 100.0000 mg | ORAL_TABLET | Freq: Every day | ORAL | 3 refills | Status: DC

## 2023-07-02 NOTE — Telephone Encounter (Signed)
 Copied from CRM [id number]. Topic: Clinical - Medication Refill >> Jul 02, 2023 10:58 AM Cherylann S wrote: Medication:   sitaGLIPtin  (JANUVIA ) 100 MG tablet  Continuous Glucose Sensor (DEXCOM G7 SENSOR) MISC  Has the patient contacted their pharmacy? Yes (Agent: If no, request that the patient contact the pharmacy for the refill. If patient does not wish to contact the pharmacy document the reason why and proceed with request.) (Agent: If yes, when and what did the pharmacy advise?)  This is the patient's preferred pharmacy:  [facility] - [place], [place] - [address] [place] [address] Phone: [phone] Fax: [phone]  Is this the correct pharmacy for this prescription? Yes If no, delete pharmacy and type the correct one.   Has the prescription been filled recently? No  Is the patient out of the medication? Yes  Has the patient been seen for an appointment in the last year OR does the patient have an upcoming appointment? Yes  Can we respond through MyChart? Yes  Agent: Please be advised that Rx refills may take up to 3 business days. We ask that you follow-up with your pharmacy.

## 2023-07-03 ENCOUNTER — Ambulatory Visit: Payer: Self-pay

## 2023-07-03 ENCOUNTER — Other Ambulatory Visit: Payer: Self-pay | Admitting: Internal Medicine

## 2023-07-03 ENCOUNTER — Telehealth: Payer: Self-pay | Admitting: Diagnostic Neuroimaging

## 2023-07-03 DIAGNOSIS — E785 Hyperlipidemia, unspecified: Secondary | ICD-10-CM

## 2023-07-03 MED ORDER — EZETIMIBE 10 MG PO TABS: 10.0000 mg | ORAL_TABLET | Freq: Every day | ORAL | 3 refills | Status: AC

## 2023-07-03 NOTE — Telephone Encounter (Signed)
 FYI Only or Action Required?: FYI only for provider.  Patient was last seen in primary care on 04/10/2023 by Kenn Pagan, DO. Called Nurse Triage reporting Medication Problem. Symptoms began yesterday. Interventions attempted: Nothing. Symptoms are: unchanged . \  Triage Disposition: Call PCP Now  Patient/caregiver understands and will follow disposition?: Yes  Copied from CRM [id number]. Topic: Clinical - Red Word Triage >> Jul 03, 2023  8:21 AM Rosaria A wrote: [facility] that prompted transfer to Nurse Triage: Patient states that she had her infusion yesterday and patient is diabetic. Patient states her blood sugar went up to 300 after her infusion and after she ate dinner last night it went up to 350.  Patient states that she has issues every time she has an infusion. Patient states the time before last when she had the infusion her sugar went up to 900 and she had to go to the hospital. Patient states that it takes a long time for the infusion because it has to drip slow. It starts at 9 am and does not finish until 2 pm. Patient states that during the infusion process she is having issues with her toes freezing up and legs having pain. Patient has issues with headaches and not sleeping at night the day of the infusion. Patient just checked her blood sugar and it is now 245.  Reason for Disposition  [1] Caller has URGENT medicine question about med that PCP or specialist prescribed AND [2] triager unable to answer question  Answer Assessment - Initial Assessment Questions 1. NAME of MEDICINE: What medicine(s) are you calling about?     Patient had the infusion yesterday  2. QUESTION: What is your question? (e.g., double dose of medicine, side effect)     Side Effects  3. PRESCRIBER: Who prescribed the medicine? Reason: if prescribed by specialist, call should be referred to that group.      4. SYMPTOMS: Do you have any symptoms? If Yes, ask: What symptoms are you having?  How  bad are the symptoms (e.g., mild, moderate, severe)     Unable to Sleep Last Night, Headaches, Aching Legs, Hyperglycemia  5. PREGNANCY:  Is there any chance that you are pregnant? When was your last menstrual period?     No and No  Patient wanted the provider to be aware.  Protocols used: Medication Question Call-A-AH

## 2023-07-03 NOTE — Telephone Encounter (Signed)
 Returned patient's call. Received rituximab  infusion w/ reduced dose of prednisone 6/23. Fasting BS prior to infusion were regularly <130 on Lantus  20 units and sitagliptin  100 mg daily. After infusion last evening her BS was 280, went up as high as 350 and now coming down to 200s. BS during our telephone call: 224. Not currently having symptoms similar to previous severe hyperglycemia. Additional infusion planned for 7/7, may have hyperglcyemia recurrence with infusions and may require short-term increase in insulin  pending course. Would not increase current insulin  dose right now given improvement, asymptomatic, moderate elevations, but monitor BS over the next several days, expect it to continue to come down further out from infusion. Encouraged to call if sustained elevations or worsening symptoms. She will keep me updated. Due for f/u appointment, message sent to front desk.  Refill of ezetimibe  requested to [facility], sent prescription.

## 2023-07-03 NOTE — Telephone Encounter (Signed)
 Pt  called to request a call back to  from MD . Pt  states when she left Intrafusion appt yesterday her blood sugar  was up to 300  and is having a hard time sleeping . Pt  states she  did not go to ER because ER would of told her to contact Neurologist . Pt states blood sugar has been norma up until yesterday ,Pt is Concern and would like to speak to someone .

## 2023-07-03 NOTE — Telephone Encounter (Signed)
 Spoke w/Pt regarding elevated BS after infusion. Pt reported her BS was 350 after infusion yesterday. She reported eating a chicken biscuit and potatoes and drinking a sweet tea during her 5 hour infusion. Pt stated she was reporting her elevated BS after infusion, as she had been instructed. Also reporting it to her PCP per Pt. Pt was thanked for the report.

## 2023-07-11 ENCOUNTER — Telehealth: Payer: Self-pay | Admitting: Internal Medicine

## 2023-07-11 NOTE — Telephone Encounter (Signed)
-----   Message from Ronnald Sergeant sent at 07/03/2023  2:23 PM EDT ----- Regarding: Urgent f/u Please arrange f/u in the next 1-2 weeks for diabetes. Thank you!

## 2023-07-11 NOTE — Telephone Encounter (Signed)
 Please refer to message below.  Attempted to contact patient via telephone to schedule an appointment at the request of Dr. Karna. No answer, but was able to leave detailed message with upcoming appointment date and time.  Will also mail appointment card to current address on file.

## 2023-07-16 DIAGNOSIS — H469 Unspecified optic neuritis: Secondary | ICD-10-CM | POA: Diagnosis not present

## 2023-07-24 ENCOUNTER — Encounter: Payer: Self-pay | Admitting: Internal Medicine

## 2023-07-24 ENCOUNTER — Ambulatory Visit: Admitting: Internal Medicine

## 2023-07-24 VITALS — BP 103/60 | HR 68 | Temp 98.0°F | Ht 62.0 in | Wt 207.8 lb

## 2023-07-24 DIAGNOSIS — I129 Hypertensive chronic kidney disease with stage 1 through stage 4 chronic kidney disease, or unspecified chronic kidney disease: Secondary | ICD-10-CM

## 2023-07-24 DIAGNOSIS — I472 Ventricular tachycardia, unspecified: Secondary | ICD-10-CM | POA: Diagnosis not present

## 2023-07-24 DIAGNOSIS — E1122 Type 2 diabetes mellitus with diabetic chronic kidney disease: Secondary | ICD-10-CM | POA: Diagnosis not present

## 2023-07-24 DIAGNOSIS — I10 Essential (primary) hypertension: Secondary | ICD-10-CM | POA: Diagnosis not present

## 2023-07-24 DIAGNOSIS — I700 Atherosclerosis of aorta: Secondary | ICD-10-CM

## 2023-07-24 DIAGNOSIS — N183 Chronic kidney disease, stage 3 unspecified: Secondary | ICD-10-CM

## 2023-07-24 DIAGNOSIS — Z794 Long term (current) use of insulin: Secondary | ICD-10-CM

## 2023-07-24 DIAGNOSIS — L02818 Cutaneous abscess of other sites: Secondary | ICD-10-CM

## 2023-07-24 DIAGNOSIS — Z6838 Body mass index (BMI) 38.0-38.9, adult: Secondary | ICD-10-CM

## 2023-07-24 DIAGNOSIS — G360 Neuromyelitis optica [Devic]: Secondary | ICD-10-CM

## 2023-07-24 DIAGNOSIS — E785 Hyperlipidemia, unspecified: Secondary | ICD-10-CM | POA: Diagnosis not present

## 2023-07-24 DIAGNOSIS — R2230 Localized swelling, mass and lump, unspecified upper limb: Secondary | ICD-10-CM | POA: Diagnosis not present

## 2023-07-24 DIAGNOSIS — R928 Other abnormal and inconclusive findings on diagnostic imaging of breast: Secondary | ICD-10-CM

## 2023-07-24 DIAGNOSIS — N1831 Chronic kidney disease, stage 3a: Secondary | ICD-10-CM

## 2023-07-24 DIAGNOSIS — Z7984 Long term (current) use of oral hypoglycemic drugs: Secondary | ICD-10-CM

## 2023-07-24 LAB — POCT GLYCOSYLATED HEMOGLOBIN (HGB A1C): Hemoglobin A1C: 6.7 % — AB (ref 4.0–5.6)

## 2023-07-24 LAB — GLUCOSE, CAPILLARY: Glucose-Capillary: 164 mg/dL — ABNORMAL HIGH (ref 70–99)

## 2023-07-24 NOTE — Assessment & Plan Note (Signed)
 Michelle Hurley reports onset of left shoulder blade pain that started in May and continued throughout June. She had no known strain or injury to the area prior to onset. Notably swollen, painful, and warm during acute phase. It has improved although continues to be tender. Exam today with an area of focal swelling over the posterior, left scapula. Mildly tender but without warmth, erythema, rash, skin changes. The area is soft but non-mobile. Discussed possible large lipoma although initial symptoms are unusual. Will start with US  to further characterize. If acutely becomes more swollen, red, etc, I have advised [name] return to clinic for further assessment.

## 2023-07-24 NOTE — Assessment & Plan Note (Signed)
 Chronic and stable. Continues to follow with neurology and completing rituximab  infusions q6m. Next due Dec 8/22, 2025. Reduced dose of steroid used with infusion since episode of DKA in 02/2023.

## 2023-07-24 NOTE — Assessment & Plan Note (Addendum)
 Creatinine/eGFR over the last several years consistent with CKD3a. No history of microalbuminuria. Likely secondary to longstanding hypertension and diabetes. Remains on ARB, not able to tolerate SGLT2i for recurrent yeast infections and DKA.  Plan -BMP, uACR -ARB

## 2023-07-24 NOTE — Assessment & Plan Note (Signed)
 Secondary prevention given history of TIA in 2014. Atorvastatin  80 mg and ezetimibe  daily. Repeat  lipids today, LDL goal <70.  Plan -lipid panel -continue atorvastatin  80, ezetimibe 

## 2023-07-24 NOTE — Assessment & Plan Note (Signed)
 Remains on BB/CCB for history of ventricular tachycardia per prior EP evaluation. Continue.

## 2023-07-24 NOTE — Assessment & Plan Note (Signed)
 Since last visit with me 04/2022, empagliflozin  discontinued due to recurrent yeast infections and good control with A1c <7%. Michelle Hurley had episode of severe hyperglycemia and DKA resulting in hospitalization in 02/2023. A1c at that time had jumped to >13%. This was felt due to steroid use given with Rituximab  therapy q6m for NMO.   Current medication regimen includes insulin  glargine 20 units daily and sitagliptin  100 mg daily. Michelle Hurley has been tolerating this well. A1c improved to 6.7% today.   CGM data reviewed today. GMI 7.3%. Avg glucose 166. TIR 73%, 0% low/very low. Michelle Hurley did have her 6 month treatment on 6/23 and 7/7. She did notice high blood glucose readings on the night of and days following treatments. CGM for the last two weeks shows BG in range 7/2-7/6 (week following treatment #1). She did have very high BG on the night of treatment (7/7), BG 300-370. Came down to 200s 7/8-7/9 and has continued to improve closer to normal range over the past week. Occasional excursions to the 200s in the last few days. No lows.   We have discussed continuation of current regimen for now. BG increases surrounding NMO treatment, improves in subsequent days to week. Overall A1c much improved without hypoglycemia. Will f/u in 3 months and monitor further trends. Could consider short-acting insulin  around the time of next infusion, however seems to have rapidly improved with this last treatment.   Plan -Continue Lantus  20 units daily, sitagliptin  100 mg daily -BMP, uACR today -Continue statin -UTD eye and foot exam

## 2023-07-24 NOTE — Progress Notes (Signed)
 Established Patient Office Visit  Subjective   [name], female    DOB: [birth year]  Age: 73 y.o. MRN: [id number]  Chief Complaint  Patient presents with   Medical Management of Chronic Issues    Diabetes-A1C. Left upper back (shoulder blade) swollen -comes and goes. Vaginal boil recently.    Michelle Hurley returns to clinic today for f/u of chronic medical conditions, as well as to discuss left shoulder blade pain and sweeling and recent vaginal concern. Please see assessment/plan in problem-based charting for further details of today's visit.    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/22/2018   Estrogen deficiency 11/16/2016   Atherosclerosis of abdominal aorta (HCC) 10/01/2015   Healthcare maintenance 04/16/2015   Allergic rhinitis 04/16/2015   Hx-TIA (transient ischemic attack) 04/06/2012   VENTRICULAR TACHYCARDIA 12/04/2008   Hyperlipidemia 12/24/2006   OBESITY, MORBID 12/24/2006   Essential hypertension 12/24/2006      Objective:     BP 103/60 (BP Location: Right Arm, Patient Position: Sitting, Cuff Size: Large)   Pulse 68   Temp 98 F (36.7 C) (Oral)   Ht 5' 2 (1.575 m)   Wt 207 lb 12.8 oz (94.3 kg)   LMP 04/05/2012   SpO2 100% Comment: RA  BMI 38.01 kg/m  BP Readings from Last 3 Encounters:  07/24/23 103/60  05/08/23 124/77  04/10/23 117/64   Wt Readings from Last 3 Encounters:  07/24/23 207 lb 12.8 oz  (94.3 kg)  05/08/23 208 lb (94.3 kg)  04/10/23 207 lb 12.8 oz (94.3 kg)   Physical Exam Constitutional:      General: She is not in acute distress.    Appearance: Normal appearance.  Pulmonary:     Effort: Pulmonary effort is normal.  Musculoskeletal:     Comments: Large swelling/mass present over L scapula compared to R. Does not feel mobile. TTP. No overlying rash, punctum, or wound. Not warm or erythematous.    Neurological:     General: No focal deficit present.     Mental Status: She is alert.  Psychiatric:        Mood and Affect: Mood normal.        Behavior: Behavior normal.       Assessment & Plan:   Problem List Items Addressed This Visit       Cardiovascular and Mediastinum   Essential hypertension (Chronic)   Blood pressure at goal today. Antihypertensives currently include carvedilol , verapamil , furosemide , losartan , and spironolactone . No changes to current regimen.  Plan -Continue losartan  25 mg daily, carvedilol  25 mg bid, spironolactone  25 mg daily, verapamil  60 tid, furosemide  40 mg daily -BMP -Return to see me in 3 months       Relevant Orders   Basic metabolic panel with GFR   VENTRICULAR TACHYCARDIA   Remains on BB/CCB for history of ventricular tachycardia per prior EP evaluation. Continue.      Atherosclerosis of abdominal aorta (HCC)   Aortoiliac atherosclerosis on CT abdomen in  2016. Remains on aspirin  and statin therapy for secondary prevention following TIA. Recheck lipid panel today.  Plan -Lipid panel -continue atorvastatin  and ezetimibe , aspirin  daily        Endocrine   Diabetes mellitus (HCC) - Primary (Chronic)   Since last visit with me 04/2022, empagliflozin  discontinued due to recurrent yeast infections and good control with A1c <7%. Michelle Hurley had episode of severe hyperglycemia and DKA resulting in hospitalization in 02/2023. A1c at that time had jumped to >13%. This was felt due to steroid use given with Rituximab  therapy q35m  for NMO.   Current medication regimen includes insulin  glargine 20 units daily and sitagliptin  100 mg daily. Michelle Hurley has been tolerating this well. A1c improved to 6.7% today.   CGM data reviewed today. GMI 7.3%. Avg glucose 166. TIR 73%, 0% low/very low. Michelle Hurley did have her 6 month treatment on 6/23 and 7/7. She did notice high blood glucose readings on the night of and days following treatments. CGM for the last two weeks shows BG in range 7/2-7/6 (week following treatment #1). She did have very high BG on the night of treatment (7/7), BG 300-370. Came down to 200s 7/8-7/9 and has continued to improve closer to normal range over the past week. Occasional excursions to the 200s in the last few days. No lows.   We have discussed continuation of current regimen for now. BG increases surrounding NMO treatment, improves in subsequent days to week. Overall A1c much improved without hypoglycemia. Will f/u in 3 months and monitor further trends. Could consider short-acting insulin  around the time of next infusion, however seems to have rapidly improved with this last treatment.   Plan -Continue Lantus  20 units daily, sitagliptin  100 mg daily -BMP, uACR today -Continue statin -UTD eye and foot exam      Relevant Orders   Microalbumin / creatinine urine ratio   POCT glycosylated hemoglobin (Hb A1C) (Completed)   Basic metabolic panel with GFR   Lipid panel     Nervous and Auditory   Neuromyelitis optica (HCC) (Chronic)   Chronic and stable. Continues to follow with neurology and completing rituximab  infusions q6m. Next due Dec 8/22, 2025. Reduced dose of steroid used with infusion since episode of DKA in 02/2023.        Musculoskeletal and Integument   Skin abscess   Last week had a painful boil on her labia that resolved with warm compress. Previously had these frequently with use of Jardiance , improved frequency since discontinuation. Advised return to clinic with recurrence of  symptoms.        Genitourinary   CKD (chronic kidney disease), stage III (HCC)   Creatinine/eGFR over the last several years consistent with CKD3a. No history of microalbuminuria. Likely secondary to longstanding hypertension and diabetes. Remains on ARB, not able to tolerate SGLT2i for recurrent yeast infections and DKA.  Plan -BMP, uACR -ARB        Other   Hyperlipidemia (Chronic)   Secondary prevention given history of TIA in 2014. Atorvastatin  80 mg and ezetimibe  daily. Repeat  lipids today, LDL goal <70.  Plan -lipid panel -continue atorvastatin  80, ezetimibe       OBESITY, MORBID   BMI 38, stable over the last several years. Complicated by hypertension, diabetes, kidney disease, hyperlipidemia, and history of TIA. Weight loss through dietary changes and increased exercise has been recommended.      Abnormality of both breasts on screening mammogram   F/u diagnostic mammogram completed 11/2022 with suspicious  findings and calcifications. Biopsy without evidence of malignancy. Plan for one year diagnostic mammogram to continue following.      Shoulder mass   Michelle Hurley reports onset of left shoulder blade pain that started in May and continued throughout June. She had no known strain or injury to the area prior to onset. Notably swollen, painful, and warm during acute phase. It has improved although continues to be tender. Exam today with an area of focal swelling over the posterior, left scapula. Mildly tender but without warmth, erythema, rash, skin changes. The area is soft but non-mobile. Discussed possible large lipoma although initial symptoms are unusual. Will start with US  to further characterize. If acutely becomes more swollen, red, etc, I have advised Michelle Hurley return to clinic for further assessment.      Relevant Orders   US  CHEST SOFT TISSUE    Return in about 3 months (around 10/24/2023).    Ronnald Sergeant, MD

## 2023-07-24 NOTE — Assessment & Plan Note (Signed)
 Aortoiliac atherosclerosis on CT abdomen in 2016. Remains on aspirin  and statin therapy for secondary prevention following TIA. Recheck lipid panel today.  Plan -Lipid panel -continue atorvastatin  and ezetimibe , aspirin  daily

## 2023-07-24 NOTE — Assessment & Plan Note (Signed)
 F/u diagnostic mammogram completed 11/2022 with suspicious findings and calcifications. Biopsy without evidence of malignancy. Plan for one year diagnostic mammogram to continue following.

## 2023-07-24 NOTE — Assessment & Plan Note (Signed)
 BMI 38, stable over the last several years. Complicated by hypertension, diabetes, kidney disease, hyperlipidemia, and history of TIA. Weight loss through dietary changes and increased exercise has been recommended.

## 2023-07-24 NOTE — Assessment & Plan Note (Signed)
 Blood pressure at goal today. Antihypertensives currently include carvedilol , verapamil , furosemide , losartan , and spironolactone . No changes to current regimen.  Plan -Continue losartan  25 mg daily, carvedilol  25 mg bid, spironolactone  25 mg daily, verapamil  60 tid, furosemide  40 mg daily -BMP -Return to see me in 3 months

## 2023-07-24 NOTE — Assessment & Plan Note (Signed)
 Last week had a painful boil on her labia that resolved with warm compress. Previously had these frequently with use of Jardiance , improved frequency since discontinuation. Advised return to clinic with recurrence of symptoms.

## 2023-07-25 ENCOUNTER — Inpatient Hospital Stay
Admission: RE | Admit: 2023-07-25 | Discharge: 2023-07-25 | Discharge status: Home / Self Care | Source: Ambulatory Visit | Attending: Internal Medicine

## 2023-07-25 DIAGNOSIS — R222 Localized swelling, mass and lump, trunk: Secondary | ICD-10-CM | POA: Diagnosis not present

## 2023-07-25 DIAGNOSIS — R2230 Localized swelling, mass and lump, unspecified upper limb: Secondary | ICD-10-CM

## 2023-07-25 LAB — BASIC METABOLIC PANEL WITH GFR
BUN/Creatinine Ratio: 14 (ref 12–28)
BUN: 17 mg/dL (ref 8–27)
CO2: 19 mmol/L — ABNORMAL LOW (ref 20–29)
Calcium: 10 mg/dL (ref 8.7–10.3)
Chloride: 102 mmol/L (ref 96–106)
Creatinine, Ser: 1.18 mg/dL — ABNORMAL HIGH (ref 0.57–1.00)
Glucose: 94 mg/dL (ref 70–99)
Potassium: 4.1 mmol/L (ref 3.5–5.2)
Sodium: 142 mmol/L (ref 134–144)
eGFR: 48 mL/min/1.73 — ABNORMAL LOW (ref >59)

## 2023-07-25 LAB — MICROALBUMIN / CREATININE URINE RATIO
Creatinine, Urine: 48.2 mg/dL
Microalb/Creat Ratio: <6 mg/g{creat} (ref 0–29)
Microalbumin, Urine: <3 ug/mL

## 2023-07-25 LAB — LIPID PANEL
Chol/HDL Ratio: 3.7 ratio (ref 0.0–4.4)
Cholesterol, Total: 127 mg/dL (ref 100–199)
HDL: 34 mg/dL — ABNORMAL LOW (ref >39)
LDL Chol Calc (NIH): 67 mg/dL (ref 0–99)
Triglycerides: 148 mg/dL (ref 0–149)
VLDL Cholesterol Cal: 26 mg/dL (ref 5–40)

## 2023-07-27 ENCOUNTER — Ambulatory Visit: Payer: Self-pay | Admitting: Internal Medicine

## 2023-07-27 NOTE — Progress Notes (Signed)
 LDL at goal, no changes. Reviewed with Ms. Eyman 7/18.

## 2023-07-27 NOTE — Progress Notes (Signed)
 CKD3a, no microalbuminuria. Reviewed with Ms. Trussell 7/18.

## 2023-08-02 ENCOUNTER — Telehealth: Payer: Self-pay | Admitting: *Deleted

## 2023-08-02 DIAGNOSIS — R2230 Localized swelling, mass and lump, unspecified upper limb: Secondary | ICD-10-CM

## 2023-08-02 NOTE — Telephone Encounter (Signed)
 Called Ms. Paulsen to review results. We discussed referral to sports medicine for further evaluation, as well as conservative management for discomfort.

## 2023-08-02 NOTE — Telephone Encounter (Signed)
 Copied from CRM [id number]. Topic: Clinical - Lab/Test Results >> Aug 02, 2023 10:48 AM Farrel B wrote: Reason for CRM: [id number] (M) patient's  stated that she say the image results in her mychart and stated nothing could be found, but she would like to speak with someone to go over the results, see why she is hurting so much, and see what else can be done. Please call patient at the number listed above.

## 2023-08-14 ENCOUNTER — Encounter: Admitting: Internal Medicine

## 2023-08-16 ENCOUNTER — Ambulatory Visit (INDEPENDENT_AMBULATORY_CARE_PROVIDER_SITE_OTHER)

## 2023-08-16 VITALS — BP 132/78 | Ht 62.0 in | Wt 204.0 lb

## 2023-08-16 DIAGNOSIS — M9902 Segmental and somatic dysfunction of thoracic region: Secondary | ICD-10-CM

## 2023-08-16 DIAGNOSIS — M62830 Muscle spasm of back: Secondary | ICD-10-CM

## 2023-08-16 DIAGNOSIS — M9907 Segmental and somatic dysfunction of upper extremity: Secondary | ICD-10-CM

## 2023-08-16 DIAGNOSIS — M9908 Segmental and somatic dysfunction of rib cage: Secondary | ICD-10-CM | POA: Diagnosis not present

## 2023-08-16 NOTE — Patient Instructions (Signed)
 Voltaren gel

## 2023-08-16 NOTE — Progress Notes (Cosign Needed)
 PCP: Karna Fellows, MD  Subjective:   HPI: Patient is a 73 y.o. female here for left scapular pain since May of this year.  Patient denies any acute injury.  Endorses pain over left superior trapezius, left levator scapula, and left rhomboid muscles.  Says muscles will get extremely tight and painful, worse with left arm movements.  Occasionally also makes stiff.  Sometimes pain is up to 7-8 out of 10 pain.  Pain seems to come and go randomly.  On further questioning patient does notice pain tends to occur after prolonged reading on her left side while lying in bed.  Patient is retired and not physically active.  Denies any radiation of pain down her arm.  Did get a massage once which seemed to help a lot for the next 1-2 weeks.  Pain returned thereafter.  Occasionally takes gabapentin  for pain relief.  Denies any concurrent neuropathy.  Has not tried any home exercises or physical therapy up to this point.  Past Medical History:  Diagnosis Date   Abdominal pain 05/22/2014   Allergic rhinitis 04/16/2015   Anxiety state, unspecified    ARTHRALGIA UNSPECIFIED SITE 12/04/2008   Qualifier: Diagnosis of  By: Darlean MD, Ozell NOVAK    Arthritis    Atherosclerosis of abdominal aorta (HCC) 10/01/2015   Aortoiliac atherosclerosis noted on CT abdomen in 2016.   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 01/2005 Echo: EF 55-65%   Other and unspecified hyperlipidemia    Palpitations    Plantar fasciitis, bilateral 06/12/2016   Plantar fasciitis, bilateral 06/12/2016   Rash 03/23/2017   Right ankle injury, initial encounter 06/12/2016   Right elbow pain 06/11/2017   Right leg pain 09/06/2016   Right optic neuritis 01/22/2018   Sciatica 01/15/2018   Shoulder pain, bilateral (Concern for CAD) 08/28/2018   Umbilical hernia    Unspecified essential hypertension    Upper back pain on right side 09/25/2019   Vaginal candidiasis 02/12/2018   VENTRICULAR TACHYCARDIA 12/04/2008   F/u  [facility]     Viral upper respiratory tract infection 01/15/2018   Vitamin D  deficiency 02/12/2018    Current Outpatient Medications on File Prior to Visit  Medication Sig Dispense Refill   acetaminophen  (TYLENOL ) 500 MG tablet Take 500 mg by mouth every 6 (six) hours as needed.     aspirin  EC 81 MG tablet Take 81 mg by mouth daily. Swallow whole.     atorvastatin  (LIPITOR) 80 MG tablet TAKE 1 TABLET EVERY DAY 90 tablet 3   Blood Glucose Monitoring Suppl (BLOOD GLUCOSE MONITOR SYSTEM) w/Device KIT Use as directed 3 (three) times daily. 1 kit 0   calcium -vitamin D  250-100 MG-UNIT tablet Take 1 tablet by mouth 2 (two) times daily. 30 tablet 3   carvedilol  (COREG ) 25 MG tablet TAKE 1 TABLET TWICE DAILY WITH MEALS 180 tablet 3  Continuous Glucose Receiver (DEXCOM G7 RECEIVER) DEVI USE AS DIRECTED 2 each 3   Continuous Glucose Sensor (DEXCOM G7 SENSOR) MISC Please apply 1 Dexcom sensor every 10 days to the fatty area in the back of your arm. 3 each 3   diclofenac  Sodium (VOLTAREN ) 1 % GEL APPLY 2 GRAMS TOPICALLY 4 (FOUR) TIMES DAILY AS NEEDED. (Patient taking differently: Apply 2 g topically as needed.) 100 g 2   ezetimibe  (ZETIA ) 10 MG tablet Take 1 tablet (10 mg total) by mouth daily. 90 tablet 3   furosemide  (LASIX ) 40 MG tablet TAKE 1 TABLET EVERY DAY 90 tablet 3   gabapentin  (NEURONTIN ) 300 MG  capsule Take 1 capsule (300 mg total) by mouth 3 (three) times daily as needed. (Patient taking differently: Take 300 mg by mouth as needed.) 90 capsule 5   Glucose Blood (BLOOD GLUCOSE TEST STRIPS) STRP Use as directed and take 3 (three) times daily. Use as directed to check blood sugar. 100 strip 0   insulin  glargine (LANTUS ) 100 UNIT/ML Solostar Pen Inject 20 Units into the skin daily. 15 mL 3   Insulin  Pen Needle 32G X 4 MM MISC Use as directed 3 (three) times daily. 300 each 0   levocetirizine (XYZAL ) 5 MG tablet TAKE 1 TABLET EVERY EVENING 90 tablet 1   losartan  (COZAAR ) 25 MG tablet Take 1 tablet (25 mg total) by mouth daily. 90 tablet 3   polyethylene glycol powder (GLYCOLAX /MIRALAX ) powder Take 17 g by mouth daily as needed for moderate constipation or severe constipation. 255 g 0   sitaGLIPtin  (JANUVIA ) 100 MG tablet Take 1 tablet (100 mg total) by mouth daily. 90 tablet 3   spironolactone  (ALDACTONE ) 25 MG tablet TAKE 1 TABLET EVERY DAY 90 tablet 3   verapamil  (CALAN ) 120 MG tablet TAKE 1/2 TABLET THREE TIMES DAILY (Patient taking differently: Take 0.5 tablets by mouth 3 (three) times daily.) 135 tablet 3   Zinc Oxide (DESITIN RAPID RELIEF) 13 % CREA Apply 1 application  topically daily. 454 g 0   No current facility-administered medications on file prior to visit.    Past Surgical History:  Procedure Laterality Date   ABDOMINAL HYSTERECTOMY  01/10/1972   BREAST BIOPSY Left 04/14/2019   BREAST BIOPSY Left 10/31/2021   BREAST BIOPSY Left 11/20/2022   MM LT BREAST BX W LOC DEV 1ST LESION IMAGE BX SPEC STEREO GUIDE 11/20/2022 [facility]   ESOPHAGOGASTRODUODENOSCOPY N/A 05/28/2014   Procedure: ESOPHAGOGASTRODUODENOSCOPY (EGD);  Surgeon: Norleen Hint, MD;  Location: [facility];  Service: Endoscopy;  Laterality: N/A;   LAPAROSCOPIC SMALL BOWEL RESECTION N/A 06/04/2014   Procedure: LAPAROSCOPIC SMALL BOWEL RESECTION;  Surgeon: Elon Pacini, MD;  Location: [facility];  Service:  General;  Laterality: N/A;   LAPAROSCOPIC TRANSABDOMINAL HERNIA N/A 05/03/2014   Procedure: LAPAROSCOPIC EXPLORATION WITH OPEN REPAIR OF INCARCERATED EPIGASTRIC HERNIA ;  Surgeon: Alm Angle, MD;  Location: [facility];  Service: General;  Laterality: N/A;    Allergies  Allergen Reactions   Mushroom Extract Complex (Obsolete) Anaphylaxis, Itching and Swelling    Swelling all over body    Codeine Nausea And Vomiting   Hydrocodone  Nausea And Vomiting   Prednisone Nausea And Vomiting   Omeprazole  Itching    BP 132/78   Ht 5' 2 (1.575 m)   Wt 204 lb (92.5 kg)   LMP 04/05/2012   BMI 37.31 kg/m       No data to display  No data to display              Objective:  Physical Exam:  Gen: NAD, comfortable in exam room  Musculoskeletal exam.  Inspection reveals no skin changes.  No ecchymosis, no edema.  Does have hypertonic musculature of left trapezius, left levator scapula, left rhomboid.  Tenderness to palpation over acute muscle spasms.  Does have elevated left scapular girdle due to muscle spasms.  Shoulder range of motion is within normal limits.  Strength rotator cuff 5 out of 5 bilaterally.  Neurovascularly intact bilaterally.  OMT exam reveals inhaled left-sided ribs 4 through 6.  Chronic myofascial tension over left scapular girdle.  Increased temperature over MSK pathology.  All findings improved after OMT as described below.   Assessment & Plan:   #Acute trapezius, levator scapula, rhomboid muscle spasm #Somatic dysfunction of thoracic region #Somatic dysfunction of ribs #Somatic dysfunction of left upper extremity Acute on chronic muscle spasms due to prolonged scapular elevation while reading on her left side in bed.  In moderate/severe pain today prior to treatment-pain improved significantly thereafter. Exam consistent with musculoskeletal pathology/muscle spasms as detailed above. Decision today to treat with OMT was based on Physical Exam  After  verbal consent patient was treated with soft tissue technique, muscle energy, rib HVLA, and FPR techniques in thoracic, rib, and left upper extremity areas.  Significant improvement in pain and scapular positioning posttreatment.  Patient tolerated the procedure well with improvement in symptoms  Patient given exercises (scapular girdle strengthening), stretches and lifestyle modifications (postural adjustments while reading)  Offered prescription medication for pain relief with patient declined.  Did recommend over-the-counter Voltaren  gel if muscle spasms return.  Patient agrees with treatment plan.  Patient will follow up as needed

## 2023-08-27 ENCOUNTER — Other Ambulatory Visit: Payer: Self-pay | Admitting: Internal Medicine

## 2023-08-27 DIAGNOSIS — E1165 Type 2 diabetes mellitus with hyperglycemia: Secondary | ICD-10-CM

## 2023-09-13 ENCOUNTER — Other Ambulatory Visit: Payer: Self-pay | Admitting: Internal Medicine

## 2023-09-13 DIAGNOSIS — Z794 Long term (current) use of insulin: Secondary | ICD-10-CM

## 2023-09-26 ENCOUNTER — Ambulatory Visit: Payer: Self-pay

## 2023-09-26 NOTE — Telephone Encounter (Signed)
 FYI Only or Action Required?: FYI only for provider.  Patient was last seen in primary care on 07/24/2023 by Karna Fellows, MD.  Called Nurse Triage reporting Cough.  Symptoms began about a month ago.  Interventions attempted: OTC medications: Alka-seltzer plus tablets .  Symptoms are: unchanged.  Triage Disposition: See PCP When Office is Open (Within 3 Days)  Patient/caregiver understands and will follow disposition?: Yes  Reason for Disposition  Cough has been present for > 3 weeks  Answer Assessment - Initial Assessment Questions States alka-seltzer plus is the only thing that helps.   1. ONSET: When did the cough begin?      Last week of August  2. SEVERITY: How bad is the cough today?      Worse at night, has to sleep elevated.   3. SPUTUM: Describe the color of your sputum (e.g., none, dry cough; clear, white, yellow, green)     Clear  4. DIFFICULTY BREATHING: Are you having difficulty breathing? If Yes, ask: How bad is it? (e.g., mild, moderate, severe)      No  5. FEVER: Do you have a fever? If Yes, ask: What is your temperature, how was it measured, and when did it start?     No  6. OTHER SYMPTOMS: Do you have any other symptoms? (e.g., runny nose, wheezing, chest pain)       No  Protocols used: Cough - Acute Productive-A-AH  Message from [place] B sent at 09/26/2023  9:35 AM EDT  Summary: Consistent coughing [id number]   Patient states she has had consistent cough for about a month, started around the last week in August, can't sleep now unless she is elevated she has an appt scheduled for the 30th but may need to be seen earlier.

## 2023-09-26 NOTE — Telephone Encounter (Signed)
 RTC to patient has been rescheduled for 10/01/2023 at 8:15 AM.  Stes phlegm is clear.

## 2023-10-01 ENCOUNTER — Other Ambulatory Visit: Payer: Self-pay

## 2023-10-01 ENCOUNTER — Ambulatory Visit (INDEPENDENT_AMBULATORY_CARE_PROVIDER_SITE_OTHER): Payer: Self-pay | Admitting: Student

## 2023-10-01 VITALS — BP 104/71 | HR 67 | Temp 98.1°F | Ht 62.0 in | Wt 214.8 lb

## 2023-10-01 DIAGNOSIS — R058 Other specified cough: Secondary | ICD-10-CM | POA: Diagnosis not present

## 2023-10-01 DIAGNOSIS — I10 Essential (primary) hypertension: Secondary | ICD-10-CM

## 2023-10-01 DIAGNOSIS — Z23 Encounter for immunization: Secondary | ICD-10-CM | POA: Diagnosis not present

## 2023-10-01 DIAGNOSIS — Z794 Long term (current) use of insulin: Secondary | ICD-10-CM

## 2023-10-01 MED ORDER — COVID-19 MRNA VAC-TRIS(PFIZER) 30 MCG/0.3ML IM SUSY
0.3000 mL | PREFILLED_SYRINGE | Freq: Once | INTRAMUSCULAR | 0 refills | Status: DC
  Filled 2023-10-01 (×2): qty 0.3, 1d supply, fill #0

## 2023-10-01 MED ORDER — FEXOFENADINE HCL 180 MG PO TABS: 180.0000 mg | ORAL_TABLET | Freq: Every day | ORAL | 3 refills | Status: DC

## 2023-10-01 MED ORDER — COVID-19 MRNA VAC-TRIS(PFIZER) 30 MCG/0.3ML IM SUSY
0.3000 mL | PREFILLED_SYRINGE | Freq: Once | INTRAMUSCULAR | 0 refills | Status: DC
  Filled 2023-10-01: qty 0.3, 1d supply, fill #0

## 2023-10-01 MED ORDER — COVID-19 MRNA VAC-TRIS(PFIZER) 30 MCG/0.3ML IM SUSY
0.3000 mL | PREFILLED_SYRINGE | Freq: Once | INTRAMUSCULAR | 0 refills | Status: AC
  Filled 2023-10-01: qty 0.3, 1d supply, fill #0

## 2023-10-01 MED ORDER — FLUTICASONE PROPIONATE 50 MCG/ACT NA SUSP: 1.0000 | Freq: Every day | NASAL | 2 refills | Status: DC

## 2023-10-01 MED ORDER — FAMOTIDINE 20 MG PO TABS: 20.0000 mg | ORAL_TABLET | Freq: Two times a day (BID) | ORAL | 1 refills | Status: DC

## 2023-10-01 NOTE — Assessment & Plan Note (Signed)
 Reports 1 month of persistent daily cough that is productive of clear sputum and is not associated with fever, chills, shortness of breath, orthopnea, increased lower extremity edema, and is similar but not as bad as previous cough that occurred about 1 year ago.  She reports worsening of her cough with cold air such as air conditioning.  She currently takes cetirizine  for her allergies but does not take Flonase  and has been recommended to neti pot in the past but does not want to do this.  Denies any reflux symptoms at night or after meals.  On exam she has no wheezing, focal abnormal lung sounds, rashes, head or neck lymphadenopathy, or abnormalities in the oropharynx.  She is not on an ACE inhibitor or other medications that commonly cause cough.  Discussed likely cause of her cough being allergic.  Less likely heart failure, pneumonia, GERD, or reactive airway disease.  Recommended switching from cetirizine  to fexofenadine  and adding Flonase  as well as considering famotidine  due to having an allergy to omeprazole  if continuing to have symptoms despite these changes.  She does have a follow-up with her PCP on 10/09/2023 who can reevaluate symptoms at that time.  She did ask about allergy testing but would like to talk about this further with her PCP.  Plan - Switch cetirizine  to fexofenadine  180 mg daily - Add Flonase  daily - Consider famotidine  20 mg twice daily if any GERD symptoms or no symptom improvement

## 2023-10-01 NOTE — Progress Notes (Signed)
 CC: Acute Concern of cough  HPI:  Michelle Hurley is a 73 y.o. female with pertinent PMH of HTN, T2DM, CKD stage IIIa, HLD, and obesity who presents as above. Please see assessment and plan below for further details.  Medications: Current Outpatient Medications  Medication Instructions   acetaminophen  (TYLENOL ) 500 mg, Every 6 hours PRN   aspirin  EC 81 mg, Daily   atorvastatin  (LIPITOR) 80 mg, Oral, Daily   Blood Glucose Monitoring Suppl (BLOOD GLUCOSE MONITOR SYSTEM) w/Device KIT Use as directed 3 (three) times daily.   calcium -vitamin D  250-100 MG-UNIT tablet 1 tablet, Oral, 2 times daily   carvedilol  (COREG ) 25 MG tablet TAKE 1 TABLET TWICE DAILY WITH MEALS   Continuous Glucose Receiver (DEXCOM G7 RECEIVER) DEVI See admin instructions   Continuous Glucose Sensor (DEXCOM G7 SENSOR) MISC APPLY 1 SENSOR EVERY 10 DAYS TO THE FATTY AREA IN THE BACK OF YOUR ARM   COVID-19 mRNA vaccine, Pfizer, (COMIRNATY) syringe 0.3 mLs, Intramuscular,  Once   diclofenac  Sodium (VOLTAREN ) 1 % GEL APPLY 2 GRAMS TOPICALLY 4 (FOUR) TIMES DAILY AS NEEDED.   ezetimibe  (ZETIA ) 10 mg, Oral, Daily   famotidine  (PEPCID ) 20 mg, Oral, 2 times daily   fexofenadine  (ALLEGRA  ALLERGY) 180 mg, Oral, Daily   fluticasone  (FLONASE ) 50 MCG/ACT nasal spray 1 spray, Each Nare, Daily   furosemide  (LASIX ) 40 mg, Oral, Daily   gabapentin  (NEURONTIN ) 300 mg, Oral, 3 times daily PRN   Glucose Blood (BLOOD GLUCOSE TEST STRIPS) STRP Use as directed and take 3 (three) times daily. Use as directed to check blood sugar.   insulin  glargine (LANTUS ) 20 Units, Subcutaneous, Daily   Insulin  Pen Needle 32G X 4 MM MISC Use as directed 3 (three) times daily.   losartan  (COZAAR ) 25 mg, Oral, Daily   polyethylene glycol powder (GLYCOLAX /MIRALAX ) 17 g, Oral, Daily PRN   sitaGLIPtin  (JANUVIA ) 100 mg, Oral, Daily   spironolactone  (ALDACTONE ) 25 MG tablet TAKE 1 TABLET EVERY DAY   verapamil  (CALAN ) 120 MG tablet TAKE 1/2 TABLET THREE TIMES  DAILY   Zinc Oxide (DESITIN RAPID RELIEF) 13 % CREA 1 application , Apply externally, Daily     Review of Systems:   Pertinent items noted in HPI and/or A&P.  Physical Exam:  Vitals:   10/01/23 0815  BP: 104/71  Pulse: 67  Temp: 98.1 F (36.7 C)  TempSrc: Oral  SpO2: 96%  Weight: 214 lb 12.8 oz (97.4 kg)  Height: 5' 2 (1.575 m)    Constitutional: Well-appearing adult female. In no acute distress. HEENT: Normocephalic, atraumatic, Sclera non-icteric, PERRL, EOM intact, oropharynx normal Cardio:Regular rate and rhythm. Pulm:Clear to auscultation bilaterally. Normal work of breathing on room air. MSK: Trace lower extremity edema bilaterally Skin:Warm and dry. Neuro:Alert and oriented x3. No focal deficit noted. Psych:Pleasant mood and affect.   Assessment & Plan:   Cough Reports 1 month of persistent daily cough that is productive of clear sputum and is not associated with fever, chills, shortness of breath, orthopnea, increased lower extremity edema, and is similar but not as bad as previous cough that occurred about 1 year ago.  She reports worsening of her cough with cold air such as air conditioning.  She currently takes cetirizine  for her allergies but does not take Flonase  and has been recommended to neti pot in the past but does not want to do this.  Denies any reflux symptoms at night or after meals.  On exam she has no wheezing, focal abnormal lung sounds, rashes, head  or neck lymphadenopathy, or abnormalities in the oropharynx.  She is not on an ACE inhibitor or other medications that commonly cause cough.  Discussed likely cause of her cough being allergic.  Less likely heart failure, pneumonia, GERD, or reactive airway disease.  Recommended switching from cetirizine  to fexofenadine  and adding Flonase  as well as considering famotidine  due to having an allergy to omeprazole  if continuing to have symptoms despite these changes.  She does have a follow-up with her PCP on  10/09/2023 who can reevaluate symptoms at that time.  She did ask about allergy testing but would like to talk about this further with her PCP.  Plan - Switch cetirizine  to fexofenadine  180 mg daily - Add Flonase  daily - Consider famotidine  20 mg twice daily if any GERD symptoms or no symptom improvement    Patient discussed with Dr. Mliss Foot  [name], DO [facility] Internal [facility] Resident PGY-2 Clinic Phone: [phone] Please contact the on call pager at [phone] for any urgent or emergent needs.

## 2023-10-01 NOTE — Patient Instructions (Addendum)
 Thank you, [name], for allowing us  to provide your care today. Today we discussed . . .  > Cough       - I think your cough is most likely due to your allergies and I would like to change your allergy medication from Zyrtec  to Allegra  (fexofenadine ) and add Flonase  (fluticasone ) nasal spray.  I am glad that you have a follow-up with Dr. Karna in about 1 week so we can see if these changes helped.  Please let us  know if you start having any more shortness of breath, coughing up green or yellow phlegm, or have any fevers. As we discussed I would also like you to start taking famotidine  (Pepcid ) if you feel like you are having any reflux symptoms.  If you do not feel like you are having any reflux then you do not have to start taking this but if your symptoms are not getting any better I think it is worth trying famotidine  20 mg twice daily.  Follow up: on 10/09/2023 at 10:45 AM with Dr. Karna    Remember:  Should you have any questions or concerns please call the internal medicine clinic at [phone].     [name], DO [facility]

## 2023-10-02 NOTE — Progress Notes (Signed)
 Internal Medicine Clinic Attending  Case discussed with the resident at the time of the visit.  We reviewed the resident's history and exam and pertinent patient test results.  I agree with the assessment, diagnosis, and plan of care documented in the resident's note.

## 2023-10-03 ENCOUNTER — Ambulatory Visit: Payer: Medicare HMO

## 2023-10-03 VITALS — Ht 62.0 in | Wt 210.0 lb

## 2023-10-03 DIAGNOSIS — Z0000 Encounter for general adult medical examination without abnormal findings: Secondary | ICD-10-CM

## 2023-10-03 NOTE — Patient Instructions (Signed)
 Michelle Hurley,  Thank you for taking the time for your Medicare Wellness Visit. I appreciate your continued commitment to your health goals. Please review the care plan we discussed, and feel free to reach out if I can assist you further.  Medicare recommends these wellness visits once per year to help you and your care team stay ahead of potential health issues. These visits are designed to focus on prevention, allowing your provider to concentrate on managing your acute and chronic conditions during your regular appointments.  Please note that Annual Wellness Visits do not include a physical exam. Some assessments may be limited, especially if the visit was conducted virtually. If needed, we may recommend a separate in-person follow-up with your provider.  Ongoing Care Seeing your primary care provider every 3 to 6 months helps us  monitor your health and provide consistent, personalized care.   Referrals If a referral was made during today's visit and you haven't received any updates within two weeks, please contact the referred provider directly to check on the status.  Recommended Screenings:  Health Maintenance  Topic Date Due   Complete foot exam   12/20/2023   Hemoglobin A1C  01/24/2024   Eye exam for diabetics  03/05/2024   COVID-19 Vaccine (5 - 2024-25 season) 03/30/2024   Yearly kidney function blood test for diabetes  07/23/2024   Yearly kidney health urinalysis for diabetes  07/23/2024   Medicare Annual Wellness Visit  10/02/2024   Breast Cancer Screening  11/13/2024   Colon Cancer Screening  01/26/2025   DTaP/Tdap/Td vaccine (2 - Td or Tdap) 04/07/2027   Pneumococcal Vaccine for age over 76  Completed   Flu Shot  Completed   DEXA scan (bone density measurement)  Completed   Hepatitis C Screening  Completed   Zoster (Shingles) Vaccine  Completed   HPV Vaccine  Aged Out   Meningitis B Vaccine  Aged Out       10/01/2023    8:15 AM  Advanced Directives  Does Patient  Have a Medical Advance Directive? Yes  Type of Advance Directive Living will  Does patient want to make changes to medical advance directive? No - Patient declined   Advance Care Planning is important because it: Ensures you receive medical care that aligns with your values, goals, and preferences. Provides guidance to your family and loved ones, reducing the emotional burden of decision-making during critical moments.  Vision: Annual vision screenings are recommended for early detection of glaucoma, cataracts, and diabetic retinopathy. These exams can also reveal signs of chronic conditions such as diabetes and high blood pressure.  Dental: Annual dental screenings help detect early signs of oral cancer, gum disease, and other conditions linked to overall health, including heart disease and diabetes.  Please see the attached documents for additional preventive care recommendations.

## 2023-10-03 NOTE — Progress Notes (Signed)
 Internal Medicine Attending:  I reviewed the AWV findings of the medical professional who conducted the visit. I was present in the office suite and immediately available to provide assistance and direction throughout the time the service was provided.

## 2023-10-03 NOTE — Progress Notes (Signed)
 Because this visit was a virtual/telehealth visit,  certain criteria was not obtained, such a blood pressure, CBG if applicable, and timed get up and go. Any medications not marked as taking were not mentioned during the medication reconciliation part of the visit. Any vitals not documented were not able to be obtained due to this being a telehealth visit or patient was unable to self-report a recent blood pressure reading due to a lack of equipment at home via telehealth. Vitals that have been documented are verbally provided by the patient.   Subjective:   Michelle Hurley is a 73 y.o. who presents for a Medicare Wellness preventive visit.  As a reminder, Annual Wellness Visits don't include a physical exam, and some assessments may be limited, especially if this visit is performed virtually. We may recommend an in-person follow-up visit with your provider if needed.  Visit Complete: Virtual I connected with  Michelle Hurley on 10/03/23 by a audio enabled telemedicine application and verified that I am speaking with the correct person using two identifiers.  Patient Location: Home  Provider Location: Office/Clinic  I discussed the limitations of evaluation and management by telemedicine. The patient expressed understanding and agreed to proceed.  Vital Signs: Because this visit was a virtual/telehealth visit, some criteria may be missing or patient reported. Any vitals not documented were not able to be obtained and vitals that have been documented are patient reported.  VideoDeclined- This patient declined Librarian, academic. Therefore the visit was completed with audio only.  Persons Participating in Visit: Patient.  AWV Questionnaire: No: Patient Medicare AWV questionnaire was not completed prior to this visit.  Cardiac Risk Factors include: advanced age (>97men, >98 women);dyslipidemia;hypertension;diabetes mellitus;obesity (BMI >30kg/m2);sedentary  lifestyle;family history of premature cardiovascular disease     Objective:    Today's Vitals   10/03/23 1349  Weight: 210 lb (95.3 kg)  Height: 5' 2 (1.575 m)  PainSc: 0-No pain   Body mass index is 38.41 kg/m.     10/01/2023    8:15 AM 07/24/2023   10:09 AM 02/23/2023    9:49 AM 02/22/2023    6:47 PM 09/28/2022   12:25 PM 06/06/2022    4:04 PM 05/01/2022    9:53 AM  Advanced Directives  Does Patient Have a Medical Advance Directive? Yes Yes Yes Yes Yes Yes Yes  Type of Advance Directive Living will Healthcare Power of [place];Living will Healthcare Power of [place];Living will Living will Healthcare Power of [place];Living will Living will;Healthcare Power of [employer] Power of [place];Living will  Does patient want to make changes to medical advance directive? No - Patient declined No - Patient declined Yes (Inpatient - patient defers changing a medical advance directive and declines information at this time) No - Patient declined  No - Patient declined No - Patient declined  Copy of Healthcare Power of Attorney in Chart?  Yes - validated most recent copy scanned in chart (See row information) Yes - validated most recent copy scanned in chart (See row information)  Yes - validated most recent copy scanned in chart (See row information) Yes - validated most recent copy scanned in chart (See row information) Yes - validated most recent copy scanned in chart (See row information)    Current Medications (verified) Outpatient Encounter Medications as of 10/03/2023  Medication Sig   acetaminophen  (TYLENOL ) 500 MG tablet Take 500 mg by mouth every 6 (six) hours as needed.   aspirin  EC 81 MG tablet Take 81 mg by  mouth daily. Swallow whole.   atorvastatin  (LIPITOR) 80 MG tablet TAKE 1 TABLET EVERY DAY   Blood Glucose Monitoring Suppl (BLOOD GLUCOSE MONITOR SYSTEM) w/Device KIT Use as directed 3 (three) times daily.   calcium -vitamin D  250-100 MG-UNIT tablet Take 1 tablet by  mouth 2 (two) times daily.   carvedilol  (COREG ) 25 MG tablet TAKE 1 TABLET TWICE DAILY WITH MEALS   Continuous Glucose Receiver (DEXCOM G7 RECEIVER) DEVI USE AS DIRECTED   Continuous Glucose Sensor (DEXCOM G7 SENSOR) MISC APPLY 1 SENSOR EVERY 10 DAYS TO THE FATTY AREA IN THE BACK OF YOUR ARM   diclofenac  Sodium (VOLTAREN ) 1 % GEL APPLY 2 GRAMS TOPICALLY 4 (FOUR) TIMES DAILY AS NEEDED. (Patient taking differently: Apply 2 g topically as needed.)   ezetimibe  (ZETIA ) 10 MG tablet Take 1 tablet (10 mg total) by mouth daily.   famotidine  (PEPCID ) 20 MG tablet Take 1 tablet (20 mg total) by mouth 2 (two) times daily.   fexofenadine  (ALLEGRA  ALLERGY) 180 MG tablet Take 1 tablet (180 mg total) by mouth daily.   fluticasone  (FLONASE ) 50 MCG/ACT nasal spray Place 1 spray into both nostrils daily.   furosemide  (LASIX ) 40 MG tablet TAKE 1 TABLET EVERY DAY   gabapentin  (NEURONTIN ) 300 MG capsule Take 1 capsule (300 mg total) by mouth 3 (three) times daily as needed. (Patient taking differently: Take 300 mg by mouth as needed.)   Glucose Blood (BLOOD GLUCOSE TEST STRIPS) STRP Use as directed and take 3 (three) times daily. Use as directed to check blood sugar.   insulin  glargine (LANTUS ) 100 UNIT/ML Solostar Pen Inject 20 Units into the skin daily.   Insulin  Pen Needle 32G X 4 MM MISC Use as directed 3 (three) times daily.   losartan  (COZAAR ) 25 MG tablet Take 1 tablet (25 mg total) by mouth daily.   polyethylene glycol powder (GLYCOLAX /MIRALAX ) powder Take 17 g by mouth daily as needed for moderate constipation or severe constipation.   sitaGLIPtin  (JANUVIA ) 100 MG tablet Take 1 tablet (100 mg total) by mouth daily.   spironolactone  (ALDACTONE ) 25 MG tablet TAKE 1 TABLET EVERY DAY   verapamil  (CALAN ) 120 MG tablet TAKE 1/2 TABLET THREE TIMES DAILY (Patient taking differently: Take 0.5 tablets by mouth 3 (three) times daily.)   Zinc Oxide (DESITIN RAPID RELIEF) 13 % CREA Apply 1 application  topically daily.    No facility-administered encounter medications on file as of 10/03/2023.    Allergies (verified) Mushroom extract complex (obsolete), Codeine, Hydrocodone , Prednisone, and Omeprazole    History: Past Medical History:  Diagnosis Date   Abdominal pain 05/22/2014   Allergic rhinitis 04/16/2015   Anxiety state, unspecified    ARTHRALGIA UNSPECIFIED SITE 12/04/2008   Qualifier: Diagnosis of  By: Darlean MD, Ozell NOVAK    Arthritis    Atherosclerosis of abdominal aorta 10/01/2015   Aortoiliac atherosclerosis noted on CT abdomen in 2016.   Atypical chest pain 12/11/2018   Candida vaginitis 05/01/2022   Cough 06/08/2022   Cough with congestion of paranasal sinus 06/01/2015   Dizziness 04/06/2012   DKA (diabetic ketoacidosis) (HCC) 02/22/2023   Essential hypertension 12/24/2006   Estrogen deficiency 11/16/2016   Fatigue 02/25/2020   GERD (gastroesophageal reflux disease) 08/07/2018   H/O: hysterectomy    Hx-TIA (transient ischemic attack) 04/06/2012   Hypertension    Hypokalemia 02/12/2018   Loss of part of visual field    Morbid obesity (HCC)    Myopia    Nausea with vomiting 05/23/2014   Neuromyelitis optica (HCC)  01/22/2018   NSVT (nonsustained ventricular tachycardia) (HCC)    a. 01/2005 Echo: EF 55-65%   Other and unspecified hyperlipidemia    Palpitations    Plantar fasciitis, bilateral 06/12/2016   Plantar fasciitis, bilateral 06/12/2016   Rash 03/23/2017   Right ankle injury, initial encounter 06/12/2016   Right elbow pain 06/11/2017   Right leg pain 09/06/2016   Right optic neuritis 01/22/2018   Sciatica 01/15/2018   Shoulder pain, bilateral (Concern for CAD) 08/28/2018   Umbilical hernia    Unspecified essential hypertension    Upper back pain on right side 09/25/2019   Vaginal candidiasis 02/12/2018   VENTRICULAR TACHYCARDIA 12/04/2008   F/u  [facility]     Viral upper respiratory tract infection 01/15/2018   Vitamin D  deficiency 02/12/2018   Past Surgical  History:  Procedure Laterality Date   ABDOMINAL HYSTERECTOMY  01/10/1972   BREAST BIOPSY Left 04/14/2019   BREAST BIOPSY Left 10/31/2021   BREAST BIOPSY Left 11/20/2022   MM LT BREAST BX W LOC DEV 1ST LESION IMAGE BX SPEC STEREO GUIDE 11/20/2022 [facility]   ESOPHAGOGASTRODUODENOSCOPY N/A 05/28/2014   Procedure: ESOPHAGOGASTRODUODENOSCOPY (EGD);  Surgeon: Norleen Hint, MD;  Location: [facility];  Service: Endoscopy;  Laterality: N/A;   LAPAROSCOPIC SMALL BOWEL RESECTION N/A 06/04/2014   Procedure: LAPAROSCOPIC SMALL BOWEL RESECTION;  Surgeon: Elon Pacini, MD;  Location: [facility];  Service: General;  Laterality: N/A;   LAPAROSCOPIC TRANSABDOMINAL HERNIA N/A 05/03/2014   Procedure: LAPAROSCOPIC EXPLORATION WITH OPEN REPAIR OF INCARCERATED EPIGASTRIC HERNIA ;  Surgeon: Alm Angle, MD;  Location: [facility];  Service: General;  Laterality: N/A;   Family History  Problem Relation Age of Onset   Heart disease Mother    Diabetes Mother    Lung cancer Mother    Hypertension Brother    Heart attack Brother    Diabetes Sister    Breast cancer Sister    Diabetes Sister    Social History   Socioeconomic History   Marital status: Divorced    Spouse name: Not on file   Number of children: 2   Years of education: college   Highest education level: Not on file  Occupational History   Occupation: Retired  Tobacco Use   Smoking status: Never   Smokeless tobacco: Never  Vaping Use   Vaping status: Never Used  Substance and Sexual Activity   Alcohol use: No    Alcohol/week: 0.0 standard drinks of alcohol   Drug use: No   Sexual activity: Not Currently  Other Topics Concern   Not on file  Social History Narrative   Current Social History 04/02/2020        Patient lives alone in an apartment which is 1 story. There are not steps up to the entrance.       Patient's method of transportation is personal car.      The highest level of education was some college.      The patient  currently retired.      Identified important Relationships are God and family       Pets : None       Interests / Fun: Travel with her sisters, although this has stopped d/t Covid       Current Stressors: Right shoulder to lower-mid back pain X 3-4 months        Social Drivers of Corporate investment banker Strain: Low Risk  (10/03/2023)   Overall Financial Resource Strain (CARDIA)    Difficulty of  Paying Living Expenses: Not hard at all  Food Insecurity: No Food Insecurity (10/03/2023)   Hunger Vital Sign    Worried About Running Out of Food in the Last Year: Never true    Ran Out of Food in the Last Year: Never true  Transportation Needs: No Transportation Needs (10/03/2023)   PRAPARE - Administrator, Civil Service (Medical): No    Lack of Transportation (Non-Medical): No  Physical Activity: Inactive (10/03/2023)   Exercise Vital Sign    Days of Exercise per Week: 0 days    Minutes of Exercise per Session: 0 min  Stress: No Stress Concern Present (10/03/2023)   [employer] of Occupational Health - Occupational Stress Questionnaire    Feeling of Stress: Not at all  Social Connections: Moderately Isolated (10/03/2023)   Social Connection and Isolation Panel    Frequency of Communication with Friends and Family: More than three times a week    Frequency of Social Gatherings with Friends and Family: Three times a week    Attends Religious Services: More than 4 times per year    Active Member of Clubs or Organizations: No    Attends Banker Meetings: Never    Marital Status: Divorced    Tobacco Counseling Counseling given: Not Answered    Clinical Intake:  Pre-visit preparation completed: Yes  Pain : No/denies pain Pain Score: 0-No pain     BMI - recorded: 38.41 Nutritional Status: BMI > 30  Obese Nutritional Risks: None Diabetes: Yes CBG done?: No  Lab Results  Component Value Date   HGBA1C 6.7 (A) 07/24/2023   HGBA1C 13.6 (A)  02/28/2023   HGBA1C 6.5 (A) 12/20/2022     How often do you need to have someone help you when you read instructions, pamphlets, or other written materials from your doctor or pharmacy?: 1 - Never What is the last grade level you completed in school?: HSG  Interpreter Needed?: No  Information entered by :: Gurneet Matarese N. Ahna Konkle, LPN.   Activities of Daily Living     10/03/2023    1:55 PM 10/01/2023    8:15 AM  In your present state of health, do you have any difficulty performing the following activities:  Hearing? 0 0  Vision? 0 0  Difficulty concentrating or making decisions? 0 0  Walking or climbing stairs? 0 0  Dressing or bathing? 0 0  Doing errands, shopping? 0 0  Preparing Food and eating ? N   Using the Toilet? N   In the past six months, have you accidently leaked urine? Y   Do you have problems with loss of bowel control? N   Managing your Medications? N   Managing your Finances? N   Housekeeping or managing your Housekeeping? N     Patient Care Team: Karna Fellows, MD as PCP - General (Internal Medicine) Jeffrie Oneil BROCKS, MD as PCP - Cardiology (Cardiology) Jegede, Olugbemiga E, MD (Internal Medicine) Sheffield, Andrez SAUNDERS, PA-C (Inactive) as Physician Assistant (Dermatology) Livingston Rigg, MD as Consulting Physician (Dermatology) Margaret, Eduard SAUNDERS, MD as Consulting Physician (Neurology) [facility]  I have updated your Care Teams any recent Medical Services you may have received from other providers in the past year.     Assessment:   This is a routine wellness examination for Michelle Hurley.  Hearing/Vision screen Hearing Screening - Comments:: Denies hearing difficulties.  Vision Screening - Comments:: Wears rx glasses - up to date with routine eye exams with Charlie  Glendia Gaudy, MD.    Goals Addressed               This Visit's Progress     10/03/2023 (pt-stated)        To maintain my health by staying independent. Lose some weight.        Depression Screen     10/03/2023    1:51 PM 10/01/2023    8:15 AM 07/24/2023   10:09 AM 09/28/2022   12:28 PM 09/06/2022   10:17 AM 06/06/2022    4:04 PM 05/01/2022    9:52 AM  PHQ 2/9 Scores  PHQ - 2 Score 0 0 0 0 0 0 0  PHQ- 9 Score 1   0       Fall Risk     10/03/2023    1:53 PM 10/01/2023    8:15 AM 07/24/2023   10:08 AM 09/28/2022   12:27 PM 09/06/2022   10:17 AM  Fall Risk   Falls in the past year? 0 0 0 0 0  Number falls in past yr: 0 0 0 0 0  Injury with Fall? 0 0 0 0 0  Risk for fall due to : No Fall Risks No Fall Risks No Fall Risks Medication side effect No Fall Risks  Follow up Falls evaluation completed Falls evaluation completed  Falls prevention discussed;Falls evaluation completed Falls evaluation completed    MEDICARE RISK AT HOME:  Medicare Risk at Home Any stairs in or around the home?: Yes (ELEVATOR) If so, are there any without handrails?: No Home free of loose throw rugs in walkways, pet beds, electrical cords, etc?: Yes Adequate lighting in your home to reduce risk of falls?: Yes Life alert?: Yes Use of a cane, walker or w/c?: No Grab bars in the bathroom?: Yes Shower chair or bench in shower?: Yes Elevated toilet seat or a handicapped toilet?: Yes  TIMED UP AND GO:  Was the test performed?  No  Cognitive Function: Declined/Normal: No cognitive concerns noted by patient or family. Patient alert, oriented, able to answer questions appropriately and recall recent events. No signs of memory loss or confusion.    10/03/2023    1:52 PM  MMSE - Mini Mental State Exam  Not completed: Unable to complete        10/03/2023    1:53 PM 09/28/2022   12:29 PM 09/20/2021    9:02 AM  6CIT Screen  What Year? 0 points 0 points 0 points  What month? 0 points 0 points 0 points  What time? 0 points 0 points 0 points  Count back from 20 0 points 0 points 0 points  Months in reverse 0 points 0 points 0 points  Repeat phrase 0 points 6 points 0 points  Total  Score 0 points 6 points 0 points    Immunizations Immunization History  Administered Date(s) Administered   Fluad Quad(high Dose 65+) 10/01/2023   Fluad Trivalent(High Dose 65+) 12/20/2022   INFLUENZA, HIGH DOSE SEASONAL PF 10/01/2023   Influenza,inj,Quad PF,6+ Mos 10/07/2013, 10/01/2015, 01/15/2018, 09/25/2019   Moderna Sars-Covid-2 Vaccination 03/17/2019, 04/18/2019, 12/18/2019   Pfizer(Comirnaty)Fall Seasonal Vaccine 12 years and older 10/01/2023   Pneumococcal Conjugate-13 10/07/2013   Pneumococcal Polysaccharide-23 04/16/2015   RSV,unspecified 10/20/2021   Tdap 04/06/2017   Zoster Recombinant(Shingrix) 02/28/2020, 10/14/2020    Screening Tests Health Maintenance  Topic Date Due   FOOT EXAM  12/20/2023   HEMOGLOBIN A1C  01/24/2024   OPHTHALMOLOGY EXAM  03/05/2024   COVID-19 Vaccine (5 -  2024-25 season) 03/30/2024   Diabetic kidney evaluation - eGFR measurement  07/23/2024   Diabetic kidney evaluation - Urine ACR  07/23/2024   Medicare Annual Wellness (AWV)  10/02/2024   Mammogram  11/13/2024   Colonoscopy  01/26/2025   DTaP/Tdap/Td (2 - Td or Tdap) 04/07/2027   Pneumococcal Vaccine: 50+ Years  Completed   Influenza Vaccine  Completed   DEXA SCAN  Completed   Hepatitis C Screening  Completed   Zoster Vaccines- Shingrix  Completed   HPV VACCINES  Aged Out   Meningococcal B Vaccine  Aged Out    Health Maintenance Items Addressed: Yes Patient aware of current care gaps.  Immunization record was verified by [facility] and updated in patient's chart.  Additional Screening:  Vision Screening: Recommended annual ophthalmology exams for early detection of glaucoma and other disorders of the eye. Is the patient up to date with their annual eye exam?  Yes  Who is the provider or what is the name of the office in which the patient attends annual eye exams? Charlie Glendia Gaudy, MD.  Dental Screening: Recommended annual dental exams for proper oral hygiene  Community Resource  Referral / Chronic Care Management: CRR required this visit?  No   CCM required this visit?  No   Plan:    I have personally reviewed and noted the following in the patient's chart:   Medical and social history Use of alcohol, tobacco or illicit drugs  Current medications and supplements including opioid prescriptions. Patient is not currently taking opioid prescriptions. Functional ability and status Nutritional status Physical activity Advanced directives List of other physicians Hospitalizations, surgeries, and ER visits in previous 12 months Vitals Screenings to include cognitive, depression, and falls Referrals and appointments  In addition, I have reviewed and discussed with patient certain preventive protocols, quality metrics, and best practice recommendations. A written personalized care plan for preventive services as well as general preventive health recommendations were provided to patient.   Roz LOISE Fuller, LPN   [id number]   After Visit Summary: (MyChart) Due to this being a telephonic visit, the after visit summary with patients personalized plan was offered to patient via MyChart   Notes: Nothing significant to report at this time.

## 2023-10-09 ENCOUNTER — Encounter: Payer: Self-pay | Admitting: Internal Medicine

## 2023-10-09 ENCOUNTER — Ambulatory Visit: Payer: Self-pay | Admitting: Internal Medicine

## 2023-10-09 VITALS — BP 120/83 | HR 60 | Temp 97.8°F | Ht 62.0 in | Wt 216.0 lb

## 2023-10-09 DIAGNOSIS — E1122 Type 2 diabetes mellitus with diabetic chronic kidney disease: Secondary | ICD-10-CM

## 2023-10-09 DIAGNOSIS — E785 Hyperlipidemia, unspecified: Secondary | ICD-10-CM

## 2023-10-09 DIAGNOSIS — R928 Other abnormal and inconclusive findings on diagnostic imaging of breast: Secondary | ICD-10-CM | POA: Diagnosis not present

## 2023-10-09 DIAGNOSIS — I10 Essential (primary) hypertension: Secondary | ICD-10-CM

## 2023-10-09 DIAGNOSIS — M5431 Sciatica, right side: Secondary | ICD-10-CM

## 2023-10-09 DIAGNOSIS — W57XXXA Bitten or stung by nonvenomous insect and other nonvenomous arthropods, initial encounter: Secondary | ICD-10-CM | POA: Insufficient documentation

## 2023-10-09 DIAGNOSIS — N1831 Chronic kidney disease, stage 3a: Secondary | ICD-10-CM | POA: Diagnosis not present

## 2023-10-09 DIAGNOSIS — Z8673 Personal history of transient ischemic attack (TIA), and cerebral infarction without residual deficits: Secondary | ICD-10-CM | POA: Diagnosis not present

## 2023-10-09 DIAGNOSIS — M5432 Sciatica, left side: Secondary | ICD-10-CM

## 2023-10-09 NOTE — Assessment & Plan Note (Signed)
 Well controlled. No need for gabapentin  use in the last year. Continue as needed.

## 2023-10-09 NOTE — Assessment & Plan Note (Signed)
 Review of CGM receiver. Last 14 days shows excellent control with TIR >90%. No lows. Continue current medication and return in 6-8 weeks for A1c.

## 2023-10-09 NOTE — Assessment & Plan Note (Signed)
 Secondary prevention given history of TIA in 2014. Atorvastatin  80 mg and ezetimibe  daily. Lipids at goal with LDL 67 07/2023. No changes to medications today.  Plan -continue atorvastatin  80, ezetimibe 

## 2023-10-09 NOTE — Patient Instructions (Signed)
 It was wonderful to see you today!  Let me know if the cough doesn't resolve with starting Allegra  and Flonase . I'll see you back in about 8 weeks for A1c check.

## 2023-10-09 NOTE — Assessment & Plan Note (Signed)
 Diagnostic mammogram completed 11/2022 with suspicious findings and calcifications. Biopsy without evidence of malignancy.One year diagnostic mammogram to continue following ordered today.

## 2023-10-09 NOTE — Assessment & Plan Note (Signed)
History of suspected TIA in 2014. Remains on aspirin 81 mg and statin daily. Continue therapy.

## 2023-10-09 NOTE — Assessment & Plan Note (Signed)
 Recent exposure to bedbugs with current home infestation. Plan for home treatment soon. I did not see any bugs on her today and no clear evidence of bites. Discussed that anxiety surrounding current situation may be contributing to her current sensations and no clear medication causing this adverse effect. She will be staying with her daughter for the month of October.

## 2023-10-09 NOTE — Assessment & Plan Note (Signed)
 Blood pressure at goal today. Antihypertensives currently include carvedilol , verapamil , furosemide , losartan , and spironolactone . Reviewed that these are unlikely to be contributing to her current symptoms with good control. No changes to current regimen.  Plan -Continue losartan  25 mg daily, carvedilol  25 mg bid, spironolactone  25 mg daily, verapamil  60 tid, furosemide  40 mg daily

## 2023-10-09 NOTE — Progress Notes (Signed)
 Established Patient Office Visit  Subjective   [name], female    DOB: [birth year]  Age: 73 y.o. MRN: [id number]  Chief Complaint  Patient presents with   Discuss medications    Along w/side effects.    Michelle Hurley returns to clinic today to discuss medications. She is concerned that some of her medications may be causing a sensation of skin crawling. No recent changes to medication. She has not taken gabapentin  in the last year. She was notified that her neighbor who sometimes comes to her house had bedbugs and she had exterminators come to her home and tell her there were bedbugs present in her house as well. She is planning to have the home fumigated later this week. She has avoided her chair where bugs were found since and has not had further bites. This morning, she noted some bumps on her left arm.   She was seen at [facility] on 9/22 for cough. She has not yet started Allegra  or Flonase  as they are coming from mail order pharmacy. Continuing to cough some although intermittent. Wanted to make sure her medications are not contributing.  Please see assessment/plan in problem-based charting for further details of today's visit.    Patient Active Problem List   Diagnosis Date Noted   Shoulder mass 07/24/2023   Skin abscess 04/11/2023   Hyperglycemia 02/22/2023   Arm paresthesia, left 06/08/2022   CKD (chronic kidney disease), stage III (HCC) 02/08/2022   Cough 05/11/2021   Diabetes mellitus (HCC) 02/10/2021   Bilateral hand pain 11/23/2020   Bilateral sciatica 07/20/2020   Abnormality of both breasts on screening mammogram 04/11/2019   Pruritic rash/Eczema 01/29/2019   GERD (gastroesophageal reflux disease) 08/07/2018   Vitamin D  deficiency 02/12/2018   Neuromyelitis optica (HCC) 01/22/2018   Estrogen deficiency 11/16/2016   Atherosclerosis of abdominal aorta 10/01/2015   Healthcare maintenance 04/16/2015   Allergic rhinitis 04/16/2015   Hx-TIA (transient  ischemic attack) 04/06/2012   VENTRICULAR TACHYCARDIA 12/04/2008   Hyperlipidemia 12/24/2006   OBESITY, MORBID 12/24/2006   Essential hypertension 12/24/2006      Objective:     BP 120/83 (BP Location: Right Arm, Patient Position: Sitting, Cuff Size: Large)   Pulse 60   Temp 97.8 F (36.6 C) (Oral)   Ht 5' 2 (1.575 m)   Wt 216 lb (98 kg)   LMP 04/05/2012   SpO2 100% Comment: RA  BMI 39.51 kg/m  BP Readings from Last 3 Encounters:  10/09/23 120/83  10/01/23 104/71  08/16/23 132/78   Wt Readings from Last 3 Encounters:  10/09/23 216 lb (98 kg)  10/03/23 210 lb (95.3 kg)  10/01/23 214 lb 12.8 oz (97.4 kg)    Physical Exam Constitutional:      General: She is not in acute distress.    Appearance: Normal appearance. She is not ill-appearing.  Pulmonary:     Effort: Pulmonary effort is normal.  Skin:    General: Skin is warm and dry.     Findings: No rash.     Comments: No clear evidence of bites over left posterior arm.   Neurological:     Mental Status: She is alert. Mental status is at baseline.  Psychiatric:        Mood and Affect: Mood normal.        Behavior: Behavior normal.       Assessment & Plan:   Problem List Items Addressed This Visit       Cardiovascular and Mediastinum  Essential hypertension (Chronic)   Blood pressure at goal today. Antihypertensives currently include carvedilol , verapamil , furosemide , losartan , and spironolactone . Reviewed that these are unlikely to be contributing to her current symptoms with good control. No changes to current regimen.  Plan -Continue losartan  25 mg daily, carvedilol  25 mg bid, spironolactone  25 mg daily, verapamil  60 tid, furosemide  40 mg daily         Endocrine   Diabetes mellitus (HCC) (Chronic)   Review of CGM receiver. Last 14 days shows excellent control with TIR >90%. No lows. Continue current medication and return in 6-8 weeks for A1c.        Nervous and Auditory   Bilateral sciatica    Well controlled. No need for gabapentin  use in the last year. Continue as needed.        Other   Hyperlipidemia (Chronic)   Secondary prevention given history of TIA in 2014. Atorvastatin  80 mg and ezetimibe  daily. Lipids at goal with LDL 67 07/2023. No changes to medications today.  Plan -continue atorvastatin  80, ezetimibe       Hx-TIA (transient ischemic attack) (Chronic)   History of suspected TIA in 2014. Remains on aspirin  81 mg and statin daily. Continue therapy.      Abnormality of both breasts on screening mammogram - Primary   Diagnostic mammogram completed 11/2022 with suspicious findings and calcifications. Biopsy without evidence of malignancy.One year diagnostic mammogram to continue following ordered today.      Relevant Orders   MM 3D DIAGNOSTIC MAMMOGRAM BILATERAL BREAST   Bedbug bite   Recent exposure to bedbugs with current home infestation. Plan for home treatment soon. I did not see any bugs on her today and no clear evidence of bites. Discussed that anxiety surrounding current situation may be contributing to her current sensations and no clear medication causing this adverse effect. She will be staying with her daughter for the month of October.        Return in about 8 weeks (around 12/04/2023) for f/u DM.    Ronnald Sergeant, MD

## 2023-10-10 ENCOUNTER — Other Ambulatory Visit: Payer: Self-pay | Admitting: Internal Medicine

## 2023-10-10 DIAGNOSIS — E785 Hyperlipidemia, unspecified: Secondary | ICD-10-CM

## 2023-10-10 DIAGNOSIS — R928 Other abnormal and inconclusive findings on diagnostic imaging of breast: Secondary | ICD-10-CM

## 2023-10-10 DIAGNOSIS — E1122 Type 2 diabetes mellitus with diabetic chronic kidney disease: Secondary | ICD-10-CM

## 2023-10-10 DIAGNOSIS — I10 Essential (primary) hypertension: Secondary | ICD-10-CM

## 2023-10-10 DIAGNOSIS — Z8673 Personal history of transient ischemic attack (TIA), and cerebral infarction without residual deficits: Secondary | ICD-10-CM

## 2023-10-10 DIAGNOSIS — R921 Mammographic calcification found on diagnostic imaging of breast: Secondary | ICD-10-CM

## 2023-10-10 DIAGNOSIS — W57XXXA Bitten or stung by nonvenomous insect and other nonvenomous arthropods, initial encounter: Secondary | ICD-10-CM

## 2023-10-10 DIAGNOSIS — M5432 Sciatica, left side: Secondary | ICD-10-CM

## 2023-10-26 ENCOUNTER — Other Ambulatory Visit: Payer: Self-pay

## 2023-10-26 DIAGNOSIS — E785 Hyperlipidemia, unspecified: Secondary | ICD-10-CM

## 2023-10-26 MED ORDER — ATORVASTATIN CALCIUM 80 MG PO TABS: 80.0000 mg | ORAL_TABLET | Freq: Every day | ORAL | 3 refills | Status: AC

## 2023-10-26 NOTE — Telephone Encounter (Signed)
 Medication sent to pharmacy

## 2023-11-12 ENCOUNTER — Other Ambulatory Visit: Payer: Self-pay | Admitting: Internal Medicine

## 2023-11-12 DIAGNOSIS — E1165 Type 2 diabetes mellitus with hyperglycemia: Secondary | ICD-10-CM

## 2023-11-15 ENCOUNTER — Ambulatory Visit
Admission: RE | Admit: 2023-11-15 | Discharge: 2023-11-15 | Disposition: A | Discharge status: Home / Self Care | Source: Ambulatory Visit | Attending: Internal Medicine | Admitting: Internal Medicine

## 2023-11-15 ENCOUNTER — Other Ambulatory Visit

## 2023-11-15 DIAGNOSIS — E54 Ascorbic acid deficiency: Secondary | ICD-10-CM | POA: Diagnosis not present

## 2023-11-15 DIAGNOSIS — Z794 Long term (current) use of insulin: Secondary | ICD-10-CM | POA: Diagnosis not present

## 2023-11-15 DIAGNOSIS — E1142 Type 2 diabetes mellitus with diabetic polyneuropathy: Secondary | ICD-10-CM | POA: Diagnosis not present

## 2023-11-15 DIAGNOSIS — R928 Other abnormal and inconclusive findings on diagnostic imaging of breast: Secondary | ICD-10-CM | POA: Diagnosis not present

## 2023-11-15 DIAGNOSIS — K08409 Partial loss of teeth, unspecified cause, unspecified class: Secondary | ICD-10-CM | POA: Diagnosis not present

## 2023-11-15 DIAGNOSIS — R921 Mammographic calcification found on diagnostic imaging of breast: Secondary | ICD-10-CM

## 2023-11-15 DIAGNOSIS — Z833 Family history of diabetes mellitus: Secondary | ICD-10-CM | POA: Diagnosis not present

## 2023-11-15 DIAGNOSIS — M1990 Unspecified osteoarthritis, unspecified site: Secondary | ICD-10-CM | POA: Diagnosis not present

## 2023-11-15 DIAGNOSIS — E785 Hyperlipidemia, unspecified: Secondary | ICD-10-CM | POA: Diagnosis not present

## 2023-11-15 DIAGNOSIS — I10 Essential (primary) hypertension: Secondary | ICD-10-CM | POA: Diagnosis not present

## 2023-11-15 DIAGNOSIS — Z8249 Family history of ischemic heart disease and other diseases of the circulatory system: Secondary | ICD-10-CM | POA: Diagnosis not present

## 2023-11-15 DIAGNOSIS — Z7982 Long term (current) use of aspirin: Secondary | ICD-10-CM | POA: Diagnosis not present

## 2023-11-19 ENCOUNTER — Other Ambulatory Visit

## 2023-11-25 ENCOUNTER — Other Ambulatory Visit: Payer: Self-pay | Admitting: Internal Medicine

## 2023-11-25 DIAGNOSIS — E7800 Pure hypercholesterolemia, unspecified: Secondary | ICD-10-CM

## 2023-11-26 ENCOUNTER — Other Ambulatory Visit: Payer: Self-pay

## 2023-11-26 MED ORDER — INSULIN PEN NEEDLE 32G X 4 MM MISC: 1.0000 | Freq: Three times a day (TID) | 0 refills | Status: DC

## 2023-11-26 NOTE — Telephone Encounter (Signed)
 Medication sent to pharmacy

## 2023-11-28 ENCOUNTER — Other Ambulatory Visit: Payer: Self-pay | Admitting: Student

## 2023-11-28 NOTE — Telephone Encounter (Signed)
 Medication sent to pharmacy

## 2023-12-10 ENCOUNTER — Telehealth: Payer: Self-pay | Admitting: *Deleted

## 2023-12-10 MED ORDER — FEXOFENADINE HCL 180 MG PO TABS: 180.0000 mg | ORAL_TABLET | Freq: Every day | ORAL | 3 refills | Status: DC

## 2023-12-10 NOTE — Telephone Encounter (Signed)
 Will forward to Dr. Karna.                      Copied from CRM [id number]. Topic: Clinical - Prescription Issue >> Dec 05, 2023  2:19 PM Michelle Hurley wrote: Reason for CRM: Patient is asking if the fexofenadine  (ALLEGRA  ALLERGY) 180 MG tablet can be sent to [facility] pharmacy on [address] as she would not have to pay so much for it if it was sent to that pharmacy. Please advise the patient.

## 2023-12-11 ENCOUNTER — Other Ambulatory Visit: Payer: Self-pay

## 2023-12-11 DIAGNOSIS — Z794 Long term (current) use of insulin: Secondary | ICD-10-CM

## 2023-12-11 MED ORDER — INSULIN GLARGINE 100 UNIT/ML SOLOSTAR PEN: 20.0000 [IU] | PEN_INJECTOR | Freq: Every day | SUBCUTANEOUS | 3 refills | Status: AC

## 2023-12-17 ENCOUNTER — Telehealth: Payer: Self-pay | Admitting: Internal Medicine

## 2023-12-17 DIAGNOSIS — Z794 Long term (current) use of insulin: Secondary | ICD-10-CM

## 2023-12-17 DIAGNOSIS — H469 Unspecified optic neuritis: Secondary | ICD-10-CM | POA: Diagnosis not present

## 2023-12-17 NOTE — Telephone Encounter (Unsigned)
 Copied from CRM #[id number]. Topic: Clinical - Medication Refill >> Dec 17, 2023  3:41 PM Cherylann S wrote: Medication: Continuous Glucose Sensor (DEXCOM G7 SENSOR) MISC  Has the patient contacted their pharmacy? Yes (Agent: If no, request that the patient contact the pharmacy for the refill. If patient does not wish to contact the pharmacy document the reason why and proceed with request.) (Agent: If yes, when and what did the pharmacy advise?)  This is the patient's preferred pharmacy:  [facility] - [place], [place] - [address] [place] [address] Phone: [phone] Fax: [phone]  Is this the correct pharmacy for this prescription? Yes If no, delete pharmacy and type the correct one.   Has the prescription been filled recently? No  Is the patient out of the medication? Yes  Has the patient been seen for an appointment in the last year OR does the patient have an upcoming appointment? Yes  Can we respond through MyChart? Yes  Agent: Please be advised that Rx refills may take up to 3 business days. We ask that you follow-up with your pharmacy.

## 2023-12-18 MED ORDER — DEXCOM G7 SENSOR MISC: 3 refills | Status: AC

## 2023-12-19 ENCOUNTER — Ambulatory Visit: Admitting: Student

## 2023-12-19 VITALS — BP 114/75 | HR 74 | Temp 97.8°F | Ht 62.0 in | Wt 213.6 lb

## 2023-12-19 DIAGNOSIS — I10 Essential (primary) hypertension: Secondary | ICD-10-CM

## 2023-12-19 DIAGNOSIS — E785 Hyperlipidemia, unspecified: Secondary | ICD-10-CM

## 2023-12-19 DIAGNOSIS — N1831 Chronic kidney disease, stage 3a: Secondary | ICD-10-CM

## 2023-12-19 LAB — POCT GLYCOSYLATED HEMOGLOBIN (HGB A1C): HbA1c, POC (controlled diabetic range): 6.3 % (ref 0.0–7.0)

## 2023-12-19 LAB — GLUCOSE, CAPILLARY: Glucose-Capillary: 166 mg/dL — ABNORMAL HIGH (ref 70–99)

## 2023-12-19 NOTE — Progress Notes (Signed)
 CC: Follow-up  HPI:  [name] is a 73 y.o. female living with a history stated below and presents today for follow-up. Please see problem based assessment and plan for additional details.  Past Medical History:  Diagnosis Date   Abdominal pain 05/22/2014   Allergic rhinitis 04/16/2015   Anxiety state, unspecified    ARTHRALGIA UNSPECIFIED SITE 12/04/2008   Qualifier: Diagnosis of  By: Darlean MD, Ozell NOVAK    Arthritis    Atherosclerosis of abdominal aorta 10/01/2015   Aortoiliac atherosclerosis noted on CT abdomen in 2016.   Atypical chest pain 12/11/2018   Candida vaginitis 05/01/2022   Cough 06/08/2022   Cough with congestion of paranasal sinus 06/01/2015   Dizziness 04/06/2012   DKA (diabetic ketoacidosis) (HCC) 02/22/2023   Essential hypertension 12/24/2006   Estrogen deficiency 11/16/2016   Fatigue 02/25/2020   GERD (gastroesophageal reflux disease) 08/07/2018   H/O: hysterectomy    Hx-TIA (transient ischemic attack) 04/06/2012   Hyperglycemia 02/22/2023   Hypertension    Hypokalemia 02/12/2018   Loss of part of visual field    Morbid obesity (HCC)    Myopia    Nausea with vomiting 05/23/2014   Neuromyelitis optica (HCC) 01/22/2018   NSVT (nonsustained ventricular tachycardia) (HCC)    a. 01/2005 Echo: EF 55-65%   Other and unspecified hyperlipidemia    Palpitations    Plantar fasciitis, bilateral 06/12/2016   Plantar fasciitis, bilateral 06/12/2016   Rash 03/23/2017   Right ankle injury, initial encounter 06/12/2016   Right elbow pain 06/11/2017   Right leg pain 09/06/2016   Right optic neuritis 01/22/2018   Sciatica 01/15/2018   Shoulder pain, bilateral (Concern for CAD) 08/28/2018   Skin abscess 04/11/2023   Umbilical hernia    Unspecified essential hypertension    Upper back pain on right side 09/25/2019   Vaginal candidiasis 02/12/2018   VENTRICULAR TACHYCARDIA 12/04/2008   F/u  [facility]     Viral upper respiratory tract infection  01/15/2018   Vitamin D  deficiency 02/12/2018    Current Outpatient Medications on File Prior to Visit  Medication Sig Dispense Refill   acetaminophen  (TYLENOL ) 500 MG tablet Take 500 mg by mouth every 6 (six) hours as needed.     aspirin  EC 81 MG tablet Take 81 mg by mouth daily. Swallow whole.     atorvastatin  (LIPITOR) 80 MG tablet Take 1 tablet (80 mg total) by mouth daily. 90 tablet 3   Blood Glucose Monitoring Suppl (BLOOD GLUCOSE MONITOR SYSTEM) w/Device KIT Use as directed 3 (three) times daily. 1 kit 0   calcium -vitamin D  250-100 MG-UNIT tablet Take 1 tablet by mouth 2 (two) times daily. 30 tablet 3   carvedilol  (COREG ) 25 MG tablet TAKE 1 TABLET TWICE DAILY WITH MEALS 180 tablet 3   Continuous Glucose Receiver (DEXCOM G7 RECEIVER) DEVI USE AS DIRECTED 1 each 3   Continuous Glucose Sensor (DEXCOM G7 SENSOR) MISC APPLY 1 SENSOR EVERY 10 DAYS TO THE FATTY AREA IN THE BACK OF YOUR ARM 9 each 3   diclofenac  Sodium (VOLTAREN ) 1 % GEL APPLY 2 GRAMS TOPICALLY 4 (FOUR) TIMES DAILY AS NEEDED. (Patient taking differently: Apply 2 g topically as needed.) 100 g 2   ezetimibe  (ZETIA ) 10 MG tablet Take 1 tablet (10 mg total) by mouth daily. 90 tablet 3   famotidine  (PEPCID ) 20 MG tablet TAKE 1 TABLET TWICE DAILY 120 tablet 5   fexofenadine  (ALLEGRA  ALLERGY) 180 MG tablet Take 1 tablet (180 mg total) by mouth daily.  30 tablet 3   fluticasone  (FLONASE ) 50 MCG/ACT nasal spray Place 1 spray into both nostrils daily. 9.9 mL 2   furosemide  (LASIX ) 40 MG tablet TAKE 1 TABLET EVERY DAY 90 tablet 3   gabapentin  (NEURONTIN ) 300 MG capsule Take 1 capsule (300 mg total) by mouth 3 (three) times daily as needed. (Patient taking differently: Take 300 mg by mouth as needed.) 90 capsule 5   Glucose Blood (BLOOD GLUCOSE TEST STRIPS) STRP Use as directed and take 3 (three) times daily. Use as directed to check blood sugar. 100 strip 0   insulin  glargine (LANTUS ) 100 UNIT/ML Solostar Pen Inject 20 Units into the skin  daily. 15 mL 3   Insulin  Pen Needle 32G X 4 MM MISC Use as directed 3 (three) times daily. 300 each 0   losartan  (COZAAR ) 25 MG tablet Take 1 tablet (25 mg total) by mouth daily. 90 tablet 3   polyethylene glycol powder (GLYCOLAX /MIRALAX ) powder Take 17 g by mouth daily as needed for moderate constipation or severe constipation. 255 g 0   sitaGLIPtin  (JANUVIA ) 100 MG tablet Take 1 tablet (100 mg total) by mouth daily. 90 tablet 3   spironolactone  (ALDACTONE ) 25 MG tablet TAKE 1 TABLET EVERY DAY 90 tablet 3   verapamil  (CALAN ) 120 MG tablet TAKE 1/2 TABLET THREE TIMES DAILY (Patient taking differently: Take 0.5 tablets by mouth 3 (three) times daily.) 135 tablet 3   Zinc Oxide (DESITIN RAPID RELIEF) 13 % CREA Apply 1 application  topically daily. 454 g 0   No current facility-administered medications on file prior to visit.    Family History  Problem Relation Age of Onset   Heart disease Mother    Diabetes Mother    Lung cancer Mother    Hypertension Brother    Heart attack Brother    Diabetes Sister    Breast cancer Sister    Diabetes Sister     Social History   Socioeconomic History   Marital status: Divorced    Spouse name: Not on file   Number of children: 2   Years of education: college   Highest education level: Not on file  Occupational History   Occupation: Retired  Tobacco Use   Smoking status: Never   Smokeless tobacco: Never  Vaping Use   Vaping status: Never Used  Substance and Sexual Activity   Alcohol use: No    Alcohol/week: 0.0 standard drinks of alcohol   Drug use: No   Sexual activity: Not Currently  Other Topics Concern   Not on file  Social History Narrative   Current Social History 04/02/2020        Patient lives alone in an apartment which is 1 story. There are not steps up to the entrance.       Patient's method of transportation is personal car.      The highest level of education was some college.      The patient currently retired.       Identified important Relationships are God and family       Pets : None       Interests / Fun: Travel with her sisters, although this has stopped d/t Covid       Current Stressors: Right shoulder to lower-mid back pain X 3-4 months        Social Drivers of Health   Tobacco Use: Low Risk (10/03/2023)   Patient History    Smoking Tobacco Use: Never    Smokeless Tobacco  Use: Never    Passive Exposure: Not on file  Financial Resource Strain: Low Risk (10/03/2023)   Overall Financial Resource Strain (CARDIA)    Difficulty of Paying Living Expenses: Not hard at all  Food Insecurity: No Food Insecurity (10/03/2023)   Epic    Worried About Programme Researcher, Broadcasting/film/video in the Last Year: Never true    Ran Out of Food in the Last Year: Never true  Transportation Needs: No Transportation Needs (10/03/2023)   Epic    Lack of Transportation (Medical): No    Lack of Transportation (Non-Medical): No  Physical Activity: Inactive (10/03/2023)   Exercise Vital Sign    Days of Exercise per Week: 0 days    Minutes of Exercise per Session: 0 min  Stress: No Stress Concern Present (10/03/2023)   [employer] of Occupational Health - Occupational Stress Questionnaire    Feeling of Stress: Not at all  Social Connections: Moderately Isolated (10/03/2023)   Social Connection and Isolation Panel    Frequency of Communication with Friends and Family: More than three times a week    Frequency of Social Gatherings with Friends and Family: Three times a week    Attends Religious Services: More than 4 times per year    Active Member of Clubs or Organizations: No    Attends Banker Meetings: Never    Marital Status: Divorced  Catering Manager Violence: Not At Risk (10/03/2023)   Epic    Fear of Current or Ex-Partner: No    Emotionally Abused: No    Physically Abused: No    Sexually Abused: No  Depression (PHQ2-9): Low Risk (10/03/2023)   Depression (PHQ2-9)    PHQ-2 Score: 1  Alcohol Screen: Low  Risk (10/03/2023)   Alcohol Screen    Last Alcohol Screening Score (AUDIT): 0  Housing: Low Risk (10/03/2023)   Epic    Unable to Pay for Housing in the Last Year: No    Number of Times Moved in the Last Year: 0    Homeless in the Last Year: No  Utilities: Not At Risk (10/03/2023)   Epic    Threatened with loss of utilities: No  Health Literacy: Adequate Health Literacy (10/03/2023)   B1300 Health Literacy    Frequency of need for help with medical instructions: Never    Review of Systems: ROS negative except for what is noted on the assessment and plan.  Vitals:   12/19/23 1054  BP: 114/75  Pulse: 74  Temp: 97.8 F (36.6 C)  TempSrc: Oral  SpO2: 96%  Weight: 213 lb 9.6 oz (96.9 kg)  Height: 5' 2 (1.575 m)    Physical Exam: Constitutional: obese, sitting in chair, in no acute distress Cardiovascular: regular rate and rhythm, no m/r/g Pulmonary/Chest: normal work of breathing on room air, lungs clear to auscultation bilaterally Skin: warm and dry Psych: normal mood and behavior  Assessment & Plan:    Patient discussed with Dr. Karna  Essential hypertension BP is 114/75.  Continue losartan , Coreg , Aldactone , verapamil , furosemide   Diabetes mellitus (HCC) A1c is 6.3.  Continue glargine 20 units daily and Januvia  100 mg daily.  Declines GLP-1 for obesity.  If A1c remains well-controlled can discuss going down on insulin .  Hyperlipidemia 07/2023 LDL was 67 goal <70 given history of TIA and diabetes.  Continue Lipitor and Zetia .   Norman Lobstein, D.O. [facility], PGY-2 Date 12/20/2023 Time 1:23 PM

## 2023-12-20 NOTE — Assessment & Plan Note (Signed)
 BP is 114/75.  Continue losartan , Coreg , Aldactone , verapamil , furosemide 

## 2023-12-20 NOTE — Assessment & Plan Note (Signed)
 A1c is 6.3.  Continue glargine 20 units daily and Januvia  100 mg daily.  Declines GLP-1 for obesity.  If A1c remains well-controlled can discuss going down on insulin .

## 2023-12-20 NOTE — Assessment & Plan Note (Signed)
 07/2023 LDL was 67 goal <70 given history of TIA and diabetes.  Continue Lipitor and Zetia .

## 2023-12-21 ENCOUNTER — Other Ambulatory Visit: Payer: Self-pay | Admitting: Student

## 2023-12-21 ENCOUNTER — Telehealth: Payer: Self-pay | Admitting: *Deleted

## 2023-12-21 MED ORDER — SEMAGLUTIDE(0.25 OR 0.5MG/DOS) 2 MG/3ML ~~LOC~~ SOPN: 0.2500 mg | PEN_INJECTOR | SUBCUTANEOUS | 1 refills | Status: DC

## 2023-12-21 NOTE — Telephone Encounter (Signed)
 Copied from CRM #[id number]. Topic: Clinical - Medication Question >> Dec 21, 2023  9:05 AM Susanna ORN wrote: Reason for CRM: Patient is requesting to have Dr. Karna give her a call. She was in the office on Wednesday, 12/17/23, for a follow up on her A1C numbers and saw a different provider. Patient states that Dr. Marylu asked her if she wanted to get on a diabetic shot medication that she would have to take once a week to lower her weight & she told him that she would need to discuss with her children first because she was nervous about the change. Ms. Altemus stated that she talked to her children & they told her to speak with her provider, Dr. Karna about it, as she trust her. Please have Dr. Karna to give patient a call to discuss further. CB #: [phone].

## 2023-12-21 NOTE — Telephone Encounter (Signed)
 Called patient to review plan She has discussed with her family and would like to start a GLP1 for weight loss, as she is interested in losing weight I discussed the side effects and the importance of eating regular meals and doing weight-bearing exercises while on the medicine to avoid muscle mass loss  I have sent Ozempic 0.25mg  weekly to her mail order pharmacy She will stop Januvia  when she receives the Ozempic She will continue Lantus   [facility] admin team - can  you please call patient to schedule her for a 4-week follow up in the resident clinic for GLP-1?  Thank you

## 2023-12-21 NOTE — Telephone Encounter (Signed)
 Called pt - informed her Dr Karna is on medical leave. Stated she would like to speak to Dr Lovie. Stated the last doctor she saw (Dr Marylu) suggested going on a new medication; given once a week and help with weight loss. She wants to be sure it will not make her sicker ; affect her diabetes/ A1C; Are there any side effects? This is why she wanted to talk to Dr Karna.

## 2023-12-24 ENCOUNTER — Telehealth: Payer: Self-pay | Admitting: *Deleted

## 2023-12-24 ENCOUNTER — Telehealth: Payer: Self-pay

## 2023-12-24 NOTE — Telephone Encounter (Signed)
 Prior Authorization for patient (Ozempic  (0.25 or 0.5 MG/DOSE) 2MG /3ML pen-injectors) for patient came through on cover my meds was submitted with last office notes and labs awaiting approval or denial.  KEY:[id number]

## 2023-12-24 NOTE — Telephone Encounter (Signed)
 Pa has been submitted and approved. I already spoke to the patient this morning. Please see my note from today.

## 2023-12-24 NOTE — Telephone Encounter (Signed)
 ALAENA STRADER (Key: [id number]) Rx #: [id number] Ozempic  (0.25 or 0.5 MG/DOSE) 2MG /3ML pen-injectors Form [employer] Electronic PA Form Created 3 days ago Sent to Plan 6 minutes ago Plan Response 6 minutes ago Submit Clinical Questions 3 minutes ago Determination Favorable 2 minutes ago Message from Plan PA Case: [id number], Status: Approved, Coverage Starts on: 01/10/2023 12:00:00 AM, Coverage Ends on: 01/08/2025 12:00:00 AM. Questions? Contact [phone].SABRA Authorization Expiration Date: January 08, 2025.   Patient is aware of approval.

## 2023-12-24 NOTE — Telephone Encounter (Unsigned)
 Copied from CRM #[id number]. Topic: Clinical - Medication Prior Auth >> Dec 24, 2023 10:25 AM Farrel B wrote: Reason for CRM:   Semaglutide ,0.25 or 0.5MG /DOS, 2 MG/3ML SOPN; [facility] - [place], [place] - [address],  [facility] - [place], [place] - [address] (Ph: [phone])  Pt states the insurance company prior authorization sent to them before prescription can be filled

## 2023-12-26 NOTE — Progress Notes (Signed)
 Internal Medicine Clinic Attending Patient was precepted with Dr. Karna who has since gone out on medical leave.  I have reviewed the residents history and exam and pertinent patient test results.  I agree with the assessment, diagnosis, and plan of care documented in the residents note.

## 2023-12-28 ENCOUNTER — Telehealth: Payer: Self-pay

## 2023-12-28 NOTE — Telephone Encounter (Signed)
 Copied from CRM [id number]. Topic: Clinical - Medication Question >> Dec 28, 2023 11:22 AM Carrielelia G wrote: Reason for CRM: Semaglutide ,0.25 or 0.5MG /DOS, 2 MG/3ML SOPN... patient received in the mail but need help with the instructions on how to take it.

## 2023-12-28 NOTE — Telephone Encounter (Signed)
 Pt want to know if it's ok to take Ozempic  with her am meds; I informed pt it's ok. Stated she will start it today.

## 2023-12-31 ENCOUNTER — Other Ambulatory Visit: Payer: Self-pay

## 2023-12-31 DIAGNOSIS — R200 Anesthesia of skin: Secondary | ICD-10-CM

## 2023-12-31 DIAGNOSIS — G360 Neuromyelitis optica [Devic]: Secondary | ICD-10-CM

## 2024-01-01 LAB — CBC WITH DIFFERENTIAL/PLATELET
Basophils Absolute: 0 x10E3/uL (ref 0.0–0.2)
Basos: 0 %
EOS (ABSOLUTE): 0 x10E3/uL (ref 0.0–0.4)
Eos: 0 %
Hematocrit: 37.7 % (ref 34.0–46.6)
Hemoglobin: 12.2 g/dL (ref 11.1–15.9)
Immature Grans (Abs): 0 x10E3/uL (ref 0.0–0.1)
Immature Granulocytes: 0 %
Lymphocytes Absolute: 0.8 x10E3/uL (ref 0.7–3.1)
Lymphs: 11 %
MCH: 27.7 pg (ref 26.6–33.0)
MCHC: 32.4 g/dL (ref 31.5–35.7)
MCV: 86 fL (ref 79–97)
Monocytes Absolute: 0.1 x10E3/uL (ref 0.1–0.9)
Monocytes: 1 %
Neutrophils Absolute: 6.8 x10E3/uL (ref 1.4–7.0)
Neutrophils: 87 %
Platelets: 261 x10E3/uL (ref 150–450)
RBC: 4.41 x10E6/uL (ref 3.77–5.28)
RDW: 14.1 % (ref 11.7–15.4)
WBC: 7.8 x10E3/uL (ref 3.4–10.8)

## 2024-01-09 ENCOUNTER — Other Ambulatory Visit: Payer: Self-pay | Admitting: Internal Medicine

## 2024-01-09 NOTE — Telephone Encounter (Signed)
 Medication sent to pharmacy

## 2024-01-13 ENCOUNTER — Ambulatory Visit: Payer: Self-pay | Admitting: Diagnostic Neuroimaging

## 2024-01-24 ENCOUNTER — Ambulatory Visit: Payer: Self-pay | Admitting: Student

## 2024-01-24 VITALS — BP 114/77 | HR 74 | Temp 97.9°F | Ht 62.0 in | Wt 215.4 lb

## 2024-01-24 DIAGNOSIS — N183 Chronic kidney disease, stage 3 unspecified: Secondary | ICD-10-CM | POA: Diagnosis not present

## 2024-01-24 DIAGNOSIS — Z7985 Long-term (current) use of injectable non-insulin antidiabetic drugs: Secondary | ICD-10-CM

## 2024-01-24 DIAGNOSIS — E1122 Type 2 diabetes mellitus with diabetic chronic kidney disease: Secondary | ICD-10-CM | POA: Diagnosis not present

## 2024-01-24 DIAGNOSIS — J00 Acute nasopharyngitis [common cold]: Secondary | ICD-10-CM

## 2024-01-24 MED ORDER — SEMAGLUTIDE(0.25 OR 0.5MG/DOS) 2 MG/3ML ~~LOC~~ SOPN: 0.5000 mg | PEN_INJECTOR | SUBCUTANEOUS | 3 refills | Status: AC

## 2024-01-24 NOTE — Patient Instructions (Signed)
 Thank you, Ms.Amalie Quattrone for allowing us  to provide your care today. Today we discussed a cold and tolerance of ozempic .  For the ozempic , start the 0.5 mg dose next week. Let us  know if you have any nausea, vomiting, constipation, or other issues with this medicine.      Please start to do some exercises as well to help maintain muscle mass.   I have ordered the following medication/changed the following medications:   Stop the following medications: Medications Discontinued During This Encounter  Medication Reason   Semaglutide ,0.25 or 0.5MG /DOS, 2 MG/3ML SOPN Dose change     Start the following medications: Meds ordered this encounter  Medications   Semaglutide ,0.25 or 0.5MG /DOS, 2 MG/3ML SOPN    Sig: Inject 0.5 mg into the skin once a week.    Dispense:  3 mL    Refill:  3     Follow up: 3 months    Remember:   Should you have any questions or concerns please call the internal medicine clinic at [phone].     Please note that our late policy has changed.  If you are more than 15 minutes late to your appointment, you may be asked to reschedule your appointment.  Dr. Kandis, D.O. [facility]

## 2024-01-24 NOTE — Assessment & Plan Note (Signed)
 Patient presents for a 4 weeks follow up after starting Ozempic  0.25 mg 4 weeks prior. Patient denies any GI sx except for chronic constipation that has not changed. She has tolerated the first three doses and is interested in increasing to the following dose of 0.5 mg after her 4th dose of 0.25 mg which she will take tomorrow.   Plan: -Encouraged patient to continue exercising to avoid muscle mass loss  -Increase ozempic  to 0.5 mg -Patient notified to inform the clinic of any AE

## 2024-01-24 NOTE — Assessment & Plan Note (Signed)
 Patient reports non-bothersome sx of the common cold that started on Sunday. Sx include congestion and sneezing. She denies fevers or chills or any allergy triggers at this time.  Plan: -Encouraged to rest, hydrate, and use flonase 

## 2024-01-24 NOTE — Progress Notes (Signed)
 "  Established Patient Office Visit  Subjective   [name], female    DOB: 01/26/1950  Age: 74 y.o. MRN: [id number]  Chief Complaint  Patient presents with   Nasal Congestion    Sneezing     Michelle Hurley is a 74 y.o. who presents to the clinic for a 4 week follow up after starting GLP-1. Please see problem based assessment and plan for additional details.    Patient Active Problem List   Diagnosis Date Noted   Bedbug bite 10/09/2023   Shoulder mass 07/24/2023   Arm paresthesia, left 06/08/2022   CKD (chronic kidney disease), stage III (HCC) 02/08/2022   Cough 05/11/2021   Diabetes mellitus (HCC) 02/10/2021   Bilateral hand pain 11/23/2020   Bilateral sciatica 07/20/2020   Abnormality of both breasts on screening mammogram 04/11/2019   Pruritic rash/Eczema 01/29/2019   GERD (gastroesophageal reflux disease) 08/07/2018   Vitamin D  deficiency 02/12/2018   Neuromyelitis optica (HCC) 01/22/2018   Common cold virus 01/15/2018   Estrogen deficiency 11/16/2016   Atherosclerosis of abdominal aorta 10/01/2015   Healthcare maintenance 04/16/2015   Allergic rhinitis 04/16/2015   Hx-TIA (transient ischemic attack) 04/06/2012   VENTRICULAR TACHYCARDIA 12/04/2008   Hyperlipidemia 12/24/2006   OBESITY, MORBID 12/24/2006   Essential hypertension 12/24/2006      Objective:     BP 114/77 (BP Location: Left Arm, Patient Position: Sitting, Cuff Size: Large)   Pulse 74   Temp 97.9 F (36.6 C) (Oral)   Ht 5' 2 (1.575 m)   Wt 215 lb 6.4 oz (97.7 kg)   LMP 04/05/2012   SpO2 98%   BMI 39.40 kg/m  BP Readings from Last 3 Encounters:  01/24/24 114/77  12/19/23 114/75  10/09/23 120/83   Wt Readings from Last 3 Encounters:  01/24/24 215 lb 6.4 oz (97.7 kg)  12/19/23 213 lb 9.6 oz (96.9 kg)  10/09/23 216 lb (98 kg)      Physical Exam Cardiovascular:     Rate and Rhythm: Normal rate and regular rhythm.     Heart sounds: No murmur heard. Pulmonary:      Effort: Pulmonary effort is normal.     Breath sounds: Normal breath sounds.  Skin:    General: Skin is warm and dry.  Psychiatric:        Mood and Affect: Mood and affect normal.        Behavior: Behavior normal.    No results found for any visits on 01/24/24.  Last metabolic panel Lab Results  Component Value Date   GLUCOSE 94 07/24/2023   NA 142 07/24/2023   K 4.1 07/24/2023   CL 102 07/24/2023   CO2 19 (L) 07/24/2023   BUN 17 07/24/2023   CREATININE 1.18 (H) 07/24/2023   EGFR 48 (L) 07/24/2023   CALCIUM  10.0 07/24/2023   PROT 6.5 02/09/2021   ALBUMIN 4.1 02/09/2021   LABGLOB 2.4 02/09/2021   AGRATIO 1.7 02/09/2021   BILITOT <0.2 02/09/2021   ALKPHOS 68 02/09/2021   AST 15 02/09/2021   ALT 24 02/09/2021   ANIONGAP 9 02/23/2023   Last hemoglobin A1c Lab Results  Component Value Date   HGBA1C 6.3 12/19/2023      The ASCVD Risk score (Arnett DK, et al., 2019) failed to calculate for the following reasons:   The valid total cholesterol range is 130 to 320 mg/dL    Assessment & Plan:   Problem List Items Addressed This Visit  Respiratory   Common cold virus   Patient reports non-bothersome sx of the common cold that started on Sunday. Sx include congestion and sneezing. She denies fevers or chills or any allergy triggers at this time.  Plan: -Encouraged to rest, hydrate, and use flonase          Endocrine   Diabetes mellitus (HCC) - Primary (Chronic)   Patient presents for a 4 weeks follow up after starting Ozempic  0.25 mg 4 weeks prior. Patient denies any GI sx except for chronic constipation that has not changed. She has tolerated the first three doses and is interested in increasing to the following dose of 0.5 mg after her 4th dose of 0.25 mg which she will take tomorrow.   Plan: -Encouraged patient to continue exercising to avoid muscle mass loss  -Increase ozempic  to 0.5 mg -Patient notified to inform the clinic of any AE      Relevant  Medications   Semaglutide ,0.25 or 0.5MG /DOS, 2 MG/3ML SOPN (Start on 01/28/2024)    Return in about 3 months (around 04/23/2024) for T2DM.    Damien Lease, DO  "

## 2024-01-24 NOTE — Progress Notes (Signed)
 Internal Medicine Clinic Attending  Case discussed with the resident at the time of the visit.  We reviewed the resident's history and exam and pertinent patient test results.  I agree with the assessment, diagnosis, and plan of care documented in the resident's note.

## 2024-01-28 ENCOUNTER — Ambulatory Visit: Payer: Self-pay | Admitting: *Deleted

## 2024-01-28 NOTE — Telephone Encounter (Signed)
" ° °  FYI Only or Action Required?: Action required by provider: clinical question for provider and recommendations for using semaglutide  pen .  Patient was last seen in primary care on 01/24/2024 by Kandis Perkins, DO.  Called Nurse Triage reporting Medication Problem.  Symptoms began today.  Interventions attempted: Other: called [facility].  Symptoms are: stable.  Triage Disposition: Call Pharmacist Within 24 Hours  Patient/caregiver understands and will follow disposition?: No, wishes to speak with PCP     Please advise. Recommended to f/u with a pharmacists.             Reason for Disposition  [1] Caller has medicine question about med NOT prescribed by primary care doctor (or NP/PA) or specialist AND [2] triager unable to answer question (e.g., compatibility with other med, storage)  Answer Assessment - Initial Assessment Questions Patient has questions regarding how much and how to take semaglutide  0.5 mg. Patient reports [facility] only sent 0.25 mg and reports they have sent enough pens and will not send new dose until next Friday. Patient take medication every Friday. Patient is confused due to on pen shows notches instead of numbers and unsure how much to give self on Friday. Recommended patient go to a pharmacy and allow pharmacist to educate and show her the correct dose to take until her new dose received. Please advise.              1. NAME of MEDICINE: What medicine(s) are you calling about?     Semaglutide  0.5 mg  2. QUESTION: What is your question? (e.g., double dose of medicine, side effect)     How to take the medication? What dose on the pen is 0.5 mg? Has notches on pen instead of numbers 3. PRESCRIBER: Who prescribed the medicine? Reason: if prescribed by specialist, call should be referred to that group.     E. Bender, DO 4. SYMPTOMS: Do you have any symptoms? If Yes, ask: What symptoms are you having?  How bad  are the symptoms (e.g., mild, moderate, severe)     Tolerating .25 mg well. Patient denies any sx 5. PREGNANCY:  Is there any chance that you are pregnant? When was your last menstrual period?     na  Protocols used: Medication Question Call-A-AH  "

## 2024-01-28 NOTE — Telephone Encounter (Signed)
 RTC to patient.  Stated someone has already called and answered her questions.

## 2024-01-31 ENCOUNTER — Telehealth: Payer: Self-pay | Admitting: *Deleted

## 2024-01-31 NOTE — Telephone Encounter (Signed)
 Pt stated she was using Ozempic  pen incorrectly. Stated she was counting the notches to 25 and not giving herself the correct dose. She went to [facility] who told her she was not giving herself O.5 mg which she is currently prescribed also showed her how to use the pen correctly.. The pharmacist told her to ask her doctor if she needs  to start over at the 0.25 mg dose.

## 2024-01-31 NOTE — Telephone Encounter (Signed)
 Copied from CRM [id number]. Topic: Clinical - Medication Question >> Jan 31, 2024 10:34 AM Michelle Hurley wrote: Reason for CRM: Patient is calling in stating that she has been taking her semaglutide  wrong, and was advised how she should be doing it the other day, the patient has further questions to ask the nurse. Please contact the patient.

## 2024-02-05 NOTE — Telephone Encounter (Signed)
 She can increase to 0.5 mg weekly.

## 2024-02-05 NOTE — Telephone Encounter (Signed)
 Pt was called / informed to increase Ozempic  to 5.0 mg.

## 2024-02-07 ENCOUNTER — Other Ambulatory Visit: Payer: Self-pay | Admitting: Internal Medicine

## 2024-02-07 NOTE — Telephone Encounter (Signed)
 Medication sent to pharmacy

## 2024-02-08 ENCOUNTER — Other Ambulatory Visit: Payer: Self-pay | Admitting: *Deleted

## 2024-02-08 MED ORDER — VERAPAMIL HCL 120 MG PO TABS: ORAL_TABLET | ORAL | 3 refills | Status: AC

## 2024-02-12 ENCOUNTER — Telehealth: Payer: Self-pay | Admitting: *Deleted

## 2024-02-12 ENCOUNTER — Other Ambulatory Visit: Payer: Self-pay

## 2024-02-12 MED ORDER — FUROSEMIDE 40 MG PO TABS: 40.0000 mg | ORAL_TABLET | Freq: Every day | ORAL | 3 refills | Status: AC

## 2024-02-12 NOTE — Telephone Encounter (Signed)
 Pt's requesting a refill on Lasix ; informed pt the refill has been sent to [facility] this am.

## 2024-02-12 NOTE — Telephone Encounter (Signed)
 Medication sent to pharmacy

## 2024-10-08 ENCOUNTER — Ambulatory Visit
